# Patient Record
Sex: Female | Born: 1940
Health system: Southern US, Community
[De-identification: ages and names within clinical notes are randomized; demographics above are authoritative.]

## PROBLEM LIST (undated history)

## (undated) DIAGNOSIS — J189 Pneumonia, unspecified organism: Secondary | ICD-10-CM

## (undated) DIAGNOSIS — E119 Type 2 diabetes mellitus without complications: Secondary | ICD-10-CM

## (undated) DIAGNOSIS — N289 Disorder of kidney and ureter, unspecified: Secondary | ICD-10-CM

## (undated) DIAGNOSIS — I509 Heart failure, unspecified: Secondary | ICD-10-CM

## (undated) DIAGNOSIS — I429 Cardiomyopathy, unspecified: Secondary | ICD-10-CM

## (undated) DIAGNOSIS — E7800 Pure hypercholesterolemia, unspecified: Secondary | ICD-10-CM

## (undated) DIAGNOSIS — R55 Syncope and collapse: Secondary | ICD-10-CM

## (undated) DIAGNOSIS — C50919 Malignant neoplasm of unspecified site of unspecified female breast: Secondary | ICD-10-CM

## (undated) DIAGNOSIS — R202 Paresthesia of skin: Secondary | ICD-10-CM

## (undated) DIAGNOSIS — I5042 Chronic combined systolic (congestive) and diastolic (congestive) heart failure: Secondary | ICD-10-CM

## (undated) DIAGNOSIS — E785 Hyperlipidemia, unspecified: Secondary | ICD-10-CM

## (undated) DIAGNOSIS — M1712 Unilateral primary osteoarthritis, left knee: Secondary | ICD-10-CM

## (undated) DIAGNOSIS — I050 Rheumatic mitral stenosis: Secondary | ICD-10-CM

## (undated) DIAGNOSIS — Z950 Presence of cardiac pacemaker: Secondary | ICD-10-CM

## (undated) DIAGNOSIS — Z9289 Personal history of other medical treatment: Secondary | ICD-10-CM

## (undated) DIAGNOSIS — I10 Essential (primary) hypertension: Secondary | ICD-10-CM

## (undated) DIAGNOSIS — C801 Malignant (primary) neoplasm, unspecified: Secondary | ICD-10-CM

## (undated) DIAGNOSIS — M1990 Unspecified osteoarthritis, unspecified site: Secondary | ICD-10-CM

## (undated) DIAGNOSIS — I442 Atrioventricular block, complete: Secondary | ICD-10-CM

## (undated) HISTORY — DX: Disorder of kidney and ureter, unspecified: N28.9

## (undated) HISTORY — DX: Hyperlipidemia, unspecified: E78.5

## (undated) HISTORY — DX: Type 2 diabetes mellitus without complications: E11.9

## (undated) HISTORY — DX: Atrioventricular block, complete: I44.2

## (undated) HISTORY — PX: JOINT REPLACEMENT: SHX530

## (undated) HISTORY — DX: Malignant neoplasm of unspecified site of unspecified female breast: C50.919

## (undated) HISTORY — PX: APPENDECTOMY: SHX54

## (undated) HISTORY — DX: Unilateral primary osteoarthritis, left knee: M17.12

## (undated) HISTORY — DX: Rheumatic mitral stenosis: I05.0

## (undated) HISTORY — DX: Chronic combined systolic (congestive) and diastolic (congestive) heart failure: I50.42

## (undated) HISTORY — PX: CHOLECYSTECTOMY: SHX55

## (undated) HISTORY — PX: OTHER SURGICAL HISTORY: SHX169

## (undated) HISTORY — DX: Syncope and collapse: R55

## (undated) HISTORY — PX: KNEE ARTHROSCOPY: SUR90

## (undated) HISTORY — DX: Presence of cardiac pacemaker: Z95.0

---

## 2006-07-23 ENCOUNTER — Encounter (HOSPITAL_BASED_OUTPATIENT_CLINIC_OR_DEPARTMENT_OTHER): Admission: RE | Admit: 2006-07-23 | Discharge: 2006-10-21 | Payer: Self-pay | Admitting: Surgery

## 2006-09-25 DIAGNOSIS — C801 Malignant (primary) neoplasm, unspecified: Secondary | ICD-10-CM

## 2006-09-25 HISTORY — DX: Malignant (primary) neoplasm, unspecified: C80.1

## 2006-09-25 HISTORY — PX: MASTECTOMY: SHX3

## 2006-10-18 ENCOUNTER — Encounter (HOSPITAL_BASED_OUTPATIENT_CLINIC_OR_DEPARTMENT_OTHER): Admission: RE | Admit: 2006-10-18 | Discharge: 2006-11-01 | Payer: Self-pay | Admitting: Surgery

## 2008-12-08 ENCOUNTER — Ambulatory Visit: Payer: Self-pay | Admitting: Cardiology

## 2009-03-08 ENCOUNTER — Ambulatory Visit: Payer: Self-pay | Admitting: Cardiology

## 2009-05-03 ENCOUNTER — Ambulatory Visit: Payer: Self-pay | Admitting: Cardiology

## 2009-06-25 ENCOUNTER — Ambulatory Visit: Payer: Self-pay | Admitting: Cardiology

## 2009-07-14 ENCOUNTER — Ambulatory Visit: Payer: Self-pay | Admitting: Cardiology

## 2009-10-06 ENCOUNTER — Ambulatory Visit: Payer: Self-pay | Admitting: Cardiology

## 2010-03-09 ENCOUNTER — Ambulatory Visit: Payer: Self-pay | Admitting: Cardiology

## 2010-04-05 ENCOUNTER — Ambulatory Visit (HOSPITAL_COMMUNITY): Admission: RE | Admit: 2010-04-05 | Discharge: 2010-04-05 | Payer: Self-pay | Admitting: Diagnostic Radiology

## 2010-04-09 ENCOUNTER — Ambulatory Visit (HOSPITAL_COMMUNITY): Admission: RE | Admit: 2010-04-09 | Discharge: 2010-04-09 | Payer: Self-pay | Admitting: Internal Medicine

## 2010-06-14 ENCOUNTER — Ambulatory Visit (HOSPITAL_COMMUNITY): Admission: RE | Admit: 2010-06-14 | Discharge: 2010-06-14 | Payer: Self-pay | Admitting: Neurosurgery

## 2010-12-08 LAB — PROTIME-INR
INR: 1.05 (ref 0.00–1.49)
Prothrombin Time: 13.9 seconds (ref 11.6–15.2)

## 2010-12-08 LAB — FUNGUS CULTURE W SMEAR: Fungal Smear: NONE SEEN

## 2010-12-08 LAB — GLUCOSE, CAPILLARY: Glucose-Capillary: 125 mg/dL — ABNORMAL HIGH (ref 70–99)

## 2010-12-08 LAB — AFB CULTURE WITH SMEAR (NOT AT ARMC): Acid Fast Smear: NONE SEEN

## 2010-12-08 LAB — CBC
HCT: 31.6 % — ABNORMAL LOW (ref 36.0–46.0)
RBC: 3.52 MIL/uL — ABNORMAL LOW (ref 3.87–5.11)

## 2010-12-08 LAB — WOUND CULTURE

## 2010-12-08 LAB — APTT: aPTT: 32 seconds (ref 24–37)

## 2010-12-08 LAB — ANAEROBIC CULTURE: Gram Stain: NONE SEEN

## 2010-12-11 LAB — CBC
Hemoglobin: 10.3 g/dL — ABNORMAL LOW (ref 12.0–15.0)
MCH: 30.8 pg (ref 26.0–34.0)
MCHC: 33.1 g/dL (ref 30.0–36.0)
MCV: 93 fL (ref 78.0–100.0)

## 2010-12-11 LAB — BODY FLUID CULTURE

## 2010-12-11 LAB — GLUCOSE, CAPILLARY: Glucose-Capillary: 195 mg/dL — ABNORMAL HIGH (ref 70–99)

## 2010-12-11 LAB — PROTIME-INR
INR: 1.11 (ref 0.00–1.49)
Prothrombin Time: 14.2 seconds (ref 11.6–15.2)

## 2011-02-10 NOTE — Assessment & Plan Note (Signed)
[facility]   NAME:  Cassandra Parker, Cassandra Parker            ACCOUNT NO.:  [id number]   MEDICAL RECORD NO.:  [id number]      DATE OF BIRTH:  1940-10-01   PHYSICIAN:  Epifania Gore. Nils Pyle, M.D.      VISIT DATE:                                     OFFICE VISIT   VITAL SIGNS:  Blood pressure is 160/75, respirations 16, pulse rate 76, and  she is afebrile.  Capillary blood glucose is 102 mg per cent.   PURPOSE OF TODAY'S VISIT:  Cassandra Parker is a 70 year old lady who returns  complaining of persistence of her stasis ulcer.  In the interim, she has  been wearing a compressive wrap.  She reports some moderate drainage but no  malodor.  She has complained of some tightness of the wrap and is requesting  a looser wrap during this session.   WOUND EXAM:  Inspection of the right lateral leg shows that the ulcer is  completely granulated with skin, exudate, no necrosis, and no need for  debridement.  There are no excessive abrasions or injuries related to her  previous wrap.   WOUND SINCE LAST VISIT:  Improved stasis ulcer.   MANAGEMENT PLAN & GOAL:  We will continue Unna wrap, we will add Promagran  to her treatment, wound care protocol.  We have counseled the patient with  regard to the use of compression.  She has in her possession bilateral below  the knee 30-40 mm compression hose but she is not wearing them.  We have  instructed her that she is to start wearing the hose on the left lower  extremity.  She is to avoid getting her compression wrap wet.  We expect the  wrap to become somewhat tight with excessive standing and walking.  If this  occurs, the patient is to elevate her legs for 15 minutes and if there is no  significant relief, she is to completely remove the wrap and call the clinic  for priority appointment.  The patient seems to understand and indicates  that she will be compliant as per above.  We will re evaluate her in 1 week.     ______________________________  Epifania Gore. Nils Pyle, M.D.     Rondel Oh  D:  08/14/2006  T:  08/14/2006  Job:  [id number]

## 2011-02-10 NOTE — Assessment & Plan Note (Signed)
[facility]   NAME:  [name], [name]            ACCOUNT NO.:  [id number]   MEDICAL RECORD NO.:  [id number]      DATE OF BIRTH:  May 03, 1941   PHYSICIAN:  Epifania Gore. Nils Pyle, M.D.      VISIT DATE:                                     OFFICE VISIT   SUBJECTIVE:  Ms. Cassandra Parker is a 70 year old female who we followed for stasis  ulcer on the lateral aspect of her right leg.  In the interim, we have  treated her with compression wraps.  She denies pain, fever, excessive  drainage, or malodor.   OBJECTIVE:  Blood pressure is 162/90, respirations 16, pulse rate 76,  temperature 98.2, capillary blood glucose is 123 mg%.  Inspection of the  lower extremity shows that there has been adequate compression manifested in  the medial wrinkles in the right lower extremity.  The ulcer is clean; there  is healthy granulating tissue.  There is no evidence of active infection,  cellulitis, or induration.   IMPRESSION:  Improved wound.   PLAN:  We will continue external compression.  We have noted that her recent  culture was positive for Pseudomonas aeruginosa.  We will continue local  wound care as treatment of choice.           ______________________________  Epifania Gore Nils Pyle, M.D.     Rondel Oh  D:  08/07/2006  T:  08/08/2006  Job:  [id number]

## 2011-02-10 NOTE — Assessment & Plan Note (Signed)
[facility]   NAME:  Cassandra Parker, BORJAS            ACCOUNT NO.:  [id number]   MEDICAL RECORD NO.:  [id number]      DATE OF BIRTH:  January 20, 1941   PHYSICIAN:  Epifania Gore. Nils Pyle, M.D.      VISIT DATE:                                     OFFICE VISIT   SUBJECTIVE:  Ms. [name] is a 70 year old female who was referred from Dr.  Consuello Masse in [place] for evaluation of a nonhealing ulceration on the  posterolateral aspect of the right lower extremity.   IMPRESSION:  Stasis ulcer.   PLAN:  Stasis ulcer, Unna boot protocol including a Silver Matrix dressing.  The patient may ultimately be a candidate for an Apligraf if significant  reduction in volume does not occur.   SUBJECTIVE:  Ms. Cassandra Parker is a 70 year old diabetic who has had an ulcer in  the aforementioned area for several weeks.  She has been managed as  outpatient with compression wraps.  She has had a screening arterial Doppler  exam, demonstrating a normal ABI.  She has also been treated with a course  of doxycycline.  Throughout her management, her sugars have been reasonably  well-controlled on her current agents.   PAST MEDICAL HISTORY:  Remarkable for the absence of allergies.   CURRENT MEDICATION LIST:  1. Metformin 850 mg b.i.d.  2. Lisinopril 20 mg every day.  3. Gabapentin 600 mg 4 every day.  4. Glyburide 5 mg 2 p.o. b.i.d.  5. Actos 30 mg every day.  6. Hydrochlorothiazide 25 mg every day.  7. Iron 65 mg b.i.d.  8. Tylenol Extra Strength p.r.n.   PAST SURGICAL HISTORY:  1. Cholecystectomy.  2. Appendectomy.   FAMILY HISTORY:  1. Diabetes.  2. Hypertension.  3. Heart attacks.   SOCIAL HISTORY:  She is married.  She has adult children who live locally.  She and her husband reside in [place].  She is medically retired.   REVIEW OF SYSTEMS:  Is specifically negative for angina pectoris.  She  reports that she has gained 30-40 pounds over the last year.  She feels that  this is related to  lack of activity which has been hampered by pain in her  knees.  She denies syncope.  She denies GI/GU complaints.  There has been no  orthopnea, and she has never smoked.  The remainder of the review of systems  is negative.   PHYSICAL EXAM:  GENERAL:  She is alert, oriented, in no acute distress.  VITAL SIGNS:  Blood pressure is 140/80.  Respirations are 18.  Pulse rate is  64.  She is afebrile.  Capillary blood glucose is 61 mg%.  HEENT:  Clear.  NECK:  Supple.  Trachea is midline.  Thyroid is nonpalpable.  LUNGS:  Clear.  HEART:  Sounds are normal.  ABDOMEN: Protuberant.  No masses are discernable.  There is bilateral 2+  edema in the lower extremities.  The dorsalis pedis pulses are 3+ and  bounding bilaterally.  There are prominent varicosities in both lower  extremities extending into the medial as well as the lateral thighs.  On the  right lower extremity laterally, there is a full thickness classic stasis  ulcer with evidence of some granulated capillary buds.  There  is a moderate  amount of exudate, but no extreme malodor.  NEUROLOGICALLY:  The patient retains protected sensation.   DISCUSSION:  This 70 year old lady has a stasis ulceration, most likely  protracted because of her tendency to retain fluid.  We have explained a  course of therapy that would involve continued compression wraps with a  Silver Matrix dressing.  Hopefully we will be able to establish granulating  base and prepare the wound for the application of an Apligraf.  The patient  has been given an opportunity to ask questions.  She seems to understand the  therapeutic approach and indicates that she would be compliant as indicated  above.           ______________________________  Epifania Gore. Nils Pyle, M.D.     Rondel Oh  D:  07/23/2006  T:  07/23/2006  Job:  [id number]   cc:   Consuello Masse

## 2011-02-10 NOTE — Assessment & Plan Note (Signed)
[facility]   NAME:  Cassandra Parker, Cassandra Parker            ACCOUNT NO.:  [id number]   MEDICAL RECORD NO.:  [id number]          DATE OF BIRTH:   PHYSICIAN:  Epifania Gore. Nils Pyle, M.D. VISIT DATE:  10/16/2006                                   OFFICE VISIT   VITAL SIGNS:  Blood pressure is 158/78, respirations 18, pulse rate 91,  temperature 97.9.  Capillary blood glucose is 91 mg%.   PURPOSE OF TODAY'S VISIT:  Cassandra Parker is a 70 year old lady who were  are treating for a stasis ulceration.  In the interim, she has worn an  Unna wrap, but has complained of intense itching and blister formation.  She denies fever.   WOUND EXAM:  The inspection of the lateral left leg shows that there is  a healthy-appearing granulating stasis ulcer, which is significantly  decreased in volume, as per the nurses measurements.  In addition, there  are areas of blister formation in the proximal area of the previous wrap  and there is an area of near blister formation on the anterior shin.  The edema is well controlled and is 1-2+ currently.  There is no  evidence of ascending infection and there is no pain.   DIAGNOSIS:  Improvement of stasis ulcer, question of topical allergy to  Unna boot components.   MANAGEMENT PLAN & GOAL:  We will discontinue the use of the Unna boot.  We will apply Ultra Mide cream and a Profore wrap.  We will reevaluate  the patient in 1 week.           ______________________________  Epifania Gore. Nils Pyle, M.D.     Rondel Oh  D:  10/16/2006  T:  10/16/2006  Job:  [id number]

## 2011-02-10 NOTE — Assessment & Plan Note (Signed)
[facility]   NAME:  CHARON, BURGEN            ACCOUNT NO.:  [id number]   MEDICAL RECORD NO.:  [id number]      DATE OF BIRTH:  [birth year]   PHYSICIAN:  Epifania Gore. Nils Pyle, M.D. VISIT DATE:  10/04/2006                                   OFFICE VISIT   VITAL SIGNS:  Blood pressure is 190/94, respirations 18, pulse rate 86,  temperature 97.8, capillary blood glucose 142 mg percent.   PURPOSE OF TODAY'S VISIT:  Ms. Grassel returns for followup of the  stasis ulcer.   WOUND EXAM:  Inspection of the right lateral leg shows that there is  decreased volume in the wound.  There is 100% granulation and scant  drainage.  There is associated 1 to 2+ edema.   WOUND SINCE LAST VISIT:  In the interim, she has been wearing a  compression wrap.  She reports decreased drainage, no malodor, no pain,  and no fever.   DIAGNOSIS:  Improved stasis ulcer.   MANAGEMENT PLAN & GOAL:  We will return the patient to an Unna wrap and  reevaluate her in one week.           ______________________________  Epifania Gore. Nils Pyle, M.D.     Rondel Oh  D:  10/04/2006  T:  10/04/2006  Job:  [id number]

## 2011-02-10 NOTE — Assessment & Plan Note (Signed)
[facility]   NAME:  JULANE, MORRICAL            ACCOUNT NO.:  [id number]   MEDICAL RECORD NO.:  [id number]      DATE OF BIRTH:  [birth year]   PHYSICIAN:  Epifania Gore. Nils Pyle, M.D.      VISIT DATE:                                   OFFICE VISIT   VITAL SIGNS:  Blood pressure 138/82, respirations 18, pulse rate 67,  temperature 97.2.  Capillary blood glucose is 79 mg%.   PURPOSE OF TODAY'S VISIT:  Ms. Tsutsui returns for followup of a right  lower extremity stasis ulcer.  In the interim, she has been treated with  Unna wrap.  She denies excessive drainage, malodor, pain, or fever.   WOUND EXAM:  Inspection of the right lower extremity shows that the  previous wound #2, which was related to wrap injury, is completely  resolved.  Wound #1 has decreased significantly in volume.  There is  100% granulation.  There is no need for debridement.   WOUND SINCE LAST VISIT:  Improvement of stasis ulcer.   MANAGEMENT PLAN & GOAL:  We will return the patient to an Unna wrap and  reevaluate her in 1 week.           ______________________________  Epifania Gore. Nils Pyle, M.D.     Rondel Oh  D:  09/27/2006  T:  09/27/2006  Job:  [id number]

## 2011-02-10 NOTE — Assessment & Plan Note (Signed)
[facility]   NAME:  Cassandra Parker, Cassandra Parker            ACCOUNT NO.:  [id number]   MEDICAL RECORD NO.:  [id number]      DATE OF BIRTH:  1941-05-23   PHYSICIAN:  Epifania Gore. Nils Pyle, M.D.      VISIT DATE:                                   OFFICE VISIT   VITAL SIGNS:  Her blood pressure is 132/82, respirations 20, pulse rate  78 and she is afebrile.  Capillary blood glucose is 153 mg percent.  g   PURPOSE OF TODAY'S VISIT:  [name] returns for followup of stasis  ulceration on her right lateral lower extremity.  In the interim, she  has complained of some excessive drainage and no excessive odor and no  pain.   WOUND EXAM:  Inspection of the right lateral leg shows that the wound is  100% granulated with clear advancing rim of epithelium at the  circumference.  Wound #2 is a ruptured blister secondary to wrap injury.  This has essentially an [place] of epithelium surrounded by healthy  appearing granulation.  Both wounds were photographed and entered into  the Wound Expert.   WOUND SINCE LAST VISIT:   CHANGE IN INTERVAL MEDICAL HISTORY:   DIAGNOSIS:  Clinical improvement of stasis ulcers as well as the wrap-  related injury.   TREATMENT:   ANESTHETIC USED:   TISSUE DEBRIDED:   LEVEL:   CHANGE IN MEDS:   COMPRESSION BANDAGE:   OTHER:   MANAGEMENT PLAN & GOAL:  We will continue the Unna wrap with a Softsorb  and reevaluate the patient in one week.          ______________________________  Epifania Gore. Nils Pyle, M.D.    Rondel Oh  D:  09/20/2006  T:  09/21/2006  Job:  [id number]

## 2011-02-10 NOTE — Assessment & Plan Note (Signed)
[facility]   NAME:  Cassandra Parker, Cassandra Parker            ACCOUNT NO.:  [id number]   MEDICAL RECORD NO.:  [id number]      DATE OF BIRTH:  Dec 29, 1940   PHYSICIAN:  Epifania Gore. Nils Pyle, M.D. VISIT DATE:  10/23/2006                                   OFFICE VISIT   SUBJECTIVE:  Cassandra Parker returns for follow-up of a stasis ulcer  involving her right lower extremity.  In the interim, she has been  treated with Ultra Mide and multilayer compression wrap.  She denies  extreme pain, fever or malodor.   OBJECTIVE:  Her vital signs are stable.  Blood pressure is 186/77.  Respirations 20.  Pulse rate 87.  Temperature of 98.1.  Blood glucose of  96.  Inspection of the lower extremity shows that wound #1 on the  lateral aspect has resolved.  Wound #2 proximally has decreased in  volume.  There is no evidence of infection or excessive drainage or  malodor.   ASSESSMENT:  Clinical improvement of stasis ulcers, responding to  compression.   PLAN:  We will return the patient to compression wrap with Ultra Mide  cream and reevaluate her in one week.           ______________________________  Epifania Gore. Nils Pyle, M.D.     Cassandra Parker  D:  10/23/2006  T:  10/24/2006  Job:  [id number]

## 2011-02-10 NOTE — Assessment & Plan Note (Signed)
[facility]   NAME:  DER, FEI            ACCOUNT NO.:  [id number]   MEDICAL RECORD NO.:  [id number]      DATE OF BIRTH:  27-Apr-1941   PHYSICIAN:  Epifania Gore. Nils Pyle, M.D.      VISIT DATE:                                   OFFICE VISIT   VITAL SIGNS:  Blood pressure is 140/80, respirations 18, pulse rate 74,  and she is 98.0.  Capillary blood glucose is 151 mg.   PURPOSE OF TODAY'S VISIT:  Ms. Chauca return for follow up of a right  lower extremity stasis ulcer.  In the interim she has been treated with  an Una-wrap and ponogram.  She did not have excessive drainage, malodor,  pain, or fever.   WOUND EXAM:  Inspection of the right lower extremity shows that the  wound is 100% granulated with scant drainage.  The granulation appears  to be healthy.  There is evidence of advancement of epithelium from the  edges.  The dimensions of the wounds are essentially static.   DIAGNOSIS:  Improved stasis ulcer.   MANAGEMENT PLAN & GOAL:  We will proceed with an Apligraf for her next  visit.           ______________________________  Epifania Gore. Nils Pyle, M.D.     Rondel Oh  D:  08/22/2006  T:  08/22/2006  Job:  [id number]

## 2011-02-10 NOTE — Assessment & Plan Note (Signed)
[facility]   NAME:  Cassandra Parker, RADUENZ            ACCOUNT NO.:  [id number]   MEDICAL RECORD NO.:  [id number]      DATE OF BIRTH:  11/08/1940   PHYSICIAN:  Epifania Gore. Nils Pyle, M.D. VISIT DATE:  09/04/2006                                   OFFICE VISIT   PURPOSE OF TODAY'S VISIT:  [name] returns for followup of an  Apligraf 1 week following this application.  She denies excessive  drainage, malodor, pain or fever.   WOUND EXAM:  The previous dressing was taken down to the nonadherent  dressing.  There is no excessive drainage.  The Apligraf was not  inspected.  We have ordered for a new multilayer compressive wrap to be  reapplied.   DIAGNOSIS:  Satisfactory postop Apligraf application.   MANAGEMENT PLAN & GOAL:  We will reevaluate the patient in 1 week.           ______________________________  Epifania Gore. Nils Pyle, M.D.     Rondel Oh  D:  09/04/2006  T:  09/05/2006  Job:  [id number]

## 2011-02-10 NOTE — Assessment & Plan Note (Signed)
[facility]   NAME:  ZEILA, PERTEET            ACCOUNT NO.:  [id number]   MEDICAL RECORD NO.:  [id number]      DATE OF BIRTH:  03/25/1941   PHYSICIAN:  Epifania Gore. Nils Pyle, M.D. VISIT DATE:  08/30/2006                                   OFFICE VISIT   VITAL SIGNS:  The blood pressure is 170/80, respirations 18, pulse rate  78, and she is afebrile.  Capillary blood glucose is 132 mg%.   PURPOSE OF TODAY'S VISIT:  Ms. Feimster is a 70 year old lady with a  stasis ulceration on her right lateral lower extremity.  She has been  treated wound preparation involving serial debridements, silver  dressings, and matrix dressings.  The wound has shown some improvement  but has lagged beyond acceptable healing closure rates.  We recommended  that we add a Apligraf.  She is presenting today for that purpose.  In  the interim, she reports no excessive drainage, no fever.   WOUND EXAM:  The compression wrap was removed.  A debridement was  performed of the wound bed down to healthy bleeding granulation tissue.  There were no retained elements of necrotic or non-viable tissue.  The  wound was copiously irrigated with saline.  Thereafter, an Apligraf was  prepared with a free-hand mesh technique and placed in the position  without difficulty.  A non-adherent dressing was placed next to the  Apligraf followed by a moist bolus of 4 x 4's.  Thereafter, a standard  Profore wrap was placed without difficulty.   DIAGNOSIS:  Satisfactory placement of Apligraf on a stasis ulcer.   MANAGEMENT PLAN & GOAL:  We will follow the patient in 1 week.  We have  counseled the patient regarding the possibility of the wrap becoming too  tight as a result of activity and/or too loose as a result of decreasing  edema.  We have advised her to call the office if she has to manipulate  the wrap at all, but, by all  means, to take care not to dislodge or  discard the Apligraf which is in satisfactory  position.  The patient  seems to understand.  She will give [place] a call in the interim if need be,  otherwise we will see her in 1 week.           ______________________________  Epifania Gore. Nils Pyle, M.D.     Rondel Oh  D:  08/30/2006  T:  08/31/2006  Job:  [id number]

## 2011-02-10 NOTE — Assessment & Plan Note (Signed)
[facility]   NAME:  Cassandra Parker, Cassandra Parker            ACCOUNT NO.:  [id number]   MEDICAL RECORD NO.:  [id number]      DATE OF BIRTH:  1941/06/04   PHYSICIAN:  Epifania Gore. Nils Pyle, M.D.      VISIT DATE:                                   OFFICE VISIT   VITAL SIGNS:  Blood pressure is 140/80, respirations 20, pulse rate 78.  She is afebrile.  Capillary blood glucose is 102 mg percent.   PURPOSE OF TODAY'S VISIT:  Cassandra Parker is a 70 year old female who is  seen in followup for a stasis ulcer involving her right lower extremity.  She has undergone placement of an Apligraf.  We are evaluating the wound  for its response.  In the interim she reports some drainage.  No malodor  and no fever.   WOUND EXAM:  Inspection of the wound shows that there is 100%  granulating base.  There is evidence of epithelium advancing from the  periphery of the wound.  Volume has not changed significantly, although  the depth of the wound seems to have decreased.  Measurements are  essentially unchanged.   WOUND SINCE LAST VISIT:   CHANGE IN INTERVAL MEDICAL HISTORY:   DIAGNOSIS:  Marginal improvement with Apligraf.   TREATMENT:   ANESTHETIC USED:   TISSUE DEBRIDED:   LEVEL:   CHANGE IN MEDS:   COMPRESSION BANDAGE:   OTHER:   MANAGEMENT PLAN & GOAL:  We will resume compression wrap and reevaluate  the patient in one week.           ______________________________  Epifania Gore. Nils Pyle, M.D.    Cassandra Parker  D:  09/11/2006  T:  09/12/2006  Job:  [id number]

## 2011-02-10 NOTE — Assessment & Plan Note (Signed)
[facility]   NAME:  [name], [name]            ACCOUNT NO.:  [id number]   MEDICAL RECORD NO.:  [id number]      DATE OF BIRTH:  [birth year]   PHYSICIAN:  Epifania Gore. Nils Pyle, M.D. VISIT DATE:  10/30/2006                                   OFFICE VISIT   SUBJECTIVE:  Cassandra Parker is a 70 year old lady who we follow for a  right stasis ulcer.  During the interim she was treated with serial  debridements, compression wraps, and an Apligraf.  During the interim  she denies excessive drainage, malodor, or pain.   OBJECTIVE:  Blood pressure is 174/86, respirations 20, pulse rate 94,  temperature 97.6.  Capillary blood glucose is 156 mg%.  Inspection of  the right lower extremity shows that the ulcers have completely  resolved.  There is healthy mature eschar with no drainage, no  inflammatory changes.  The edema is well-controlled as a result of the  compression wrap.   PLAN:  We are discharging the patient.  We have instructed her to wear  the compression hose, which she has already procured.  She understands  that if she experiences trauma, recurrent ulceration she is to contact  the wound center or her primary care physician as she will require  reinstitution of compression.  The patient seems to understand.  She has  been given an opportunity to ask questions.  She expresses gratitude for  having been seen in the clinic and indicates that she will be compliant  as per above.           ______________________________  Epifania Gore. Nils Pyle, M.D.     Rondel Oh  D:  10/30/2006  T:  10/31/2006  Job:  [id number]   cc:   Consuello Masse

## 2011-02-10 NOTE — Assessment & Plan Note (Signed)
[facility]   NAME:  LORMA, PASSAFIUME            ACCOUNT NO.:  [id number]   MEDICAL RECORD NO.:  [id number]      DATE OF BIRTH:  11-09-1940   PHYSICIAN:  Epifania Gore. Nils Pyle, M.D. VISIT DATE:  07/31/2006                                     OFFICE VISIT   SUBJECTIVE:  Miss Bryand returns for followup of a stasis ulcer on the  right lower extremity.  In the interim she reports that there has been  significant drainage through her wrap.  She denies fever, pain, or excessive  swelling.   OBJECTIVE:  Her blood pressure:  140/90.  Respirations 18.  Pulse rate 82  inches.  She is afebrile.  Capillary blood glucose is 61 mg%.  Inspection of  the wound shows that there is a clean, granulated wound on the lateral  aspect of the right lower extremity.  There are associated signs of chronic  stasis including hyperpigmentation.  The edema is reasonably controlled.  It  is now 1+.  There are linear wrinkles consistent with adequate compression.   IMPRESSION:  Clinical response to compression of the stasis ulcer.   PLAN:  The was cultured and an Unna boot reapplied.  We will re-evaluate the  patient in 1 week.           ______________________________  Epifania Gore. Nils Pyle, M.D.     Rondel Oh  D:  07/31/2006  T:  08/01/2006  Job:  [id number]   cc:   Consuello Masse

## 2011-06-26 ENCOUNTER — Encounter (HOSPITAL_COMMUNITY)
Admission: RE | Admit: 2011-06-26 | Discharge: 2011-06-26 | Disposition: A | Discharge status: Home / Self Care | Payer: Medicare Other | Source: Ambulatory Visit | Attending: Ophthalmology | Admitting: Ophthalmology

## 2011-06-26 ENCOUNTER — Other Ambulatory Visit: Payer: Self-pay

## 2011-06-26 ENCOUNTER — Encounter (HOSPITAL_COMMUNITY): Payer: Self-pay

## 2011-06-26 HISTORY — DX: Malignant (primary) neoplasm, unspecified: C80.1

## 2011-06-26 HISTORY — DX: Pure hypercholesterolemia, unspecified: E78.00

## 2011-06-26 HISTORY — DX: Essential (primary) hypertension: I10

## 2011-06-26 LAB — CBC
Hemoglobin: 10.9 g/dL — ABNORMAL LOW (ref 12.0–15.0)
RBC: 3.57 MIL/uL — ABNORMAL LOW (ref 3.87–5.11)
WBC: 7.2 10*3/uL (ref 4.0–10.5)

## 2011-06-26 LAB — BASIC METABOLIC PANEL
BUN: 25 mg/dL — ABNORMAL HIGH (ref 6–23)
CO2: 27 mEq/L (ref 19–32)
Calcium: 9.3 mg/dL (ref 8.4–10.5)
Chloride: 107 mEq/L (ref 96–112)
Glucose, Bld: 205 mg/dL — ABNORMAL HIGH (ref 70–99)
Potassium: 4.1 mEq/L (ref 3.5–5.1)

## 2011-06-26 NOTE — Patient Instructions (Addendum)
[name]  06/26/2011   Your procedure is scheduled on:  06/29/2011  Report to [address] [facility] at  57  AM.  Call this number if you have problems the morning of surgery: [phone]   Remember:   Do not eat food:After Midnight.  Do not drink clear liquids: After Midnight.  Take these medicines the morning of surgery with A SIP OF WATER: Coreg and lisinopril   Do not wear jewelry, make-up or nail polish.  Do not wear lotions, powders, or perfumes. You may wear deodorant.  Do not shave 48 hours prior to surgery.  Do not bring valuables to the hospital.  Contacts, dentures or bridgework may not be worn into surgery.  Leave suitcase in the car. After surgery it may be brought to your room.  For patients admitted to the hospital, checkout time is 11:00 AM the day of discharge.   Patients discharged the day of surgery will not be allowed to drive home.  Name and phone number of your driver: family  Special Instructions: N/A   Please read over the following fact sheets that you were given: Pain Booklet, Surgical Site Infection Prevention, Anesthesia Post-op Instructions and Care and Recovery After Surgery PATIENT INSTRUCTIONS POST-ANESTHESIA  IMMEDIATELY FOLLOWING SURGERY:  Do not drive or operate machinery for the first twenty four hours after surgery.  Do not make any important decisions for twenty four hours after surgery or while taking narcotic pain medications or sedatives.  If you develop intractable nausea and vomiting or a severe headache please notify your doctor immediately.  FOLLOW-UP:  Please make an appointment with your surgeon as instructed. You do not need to follow up with anesthesia unless specifically instructed to do so.  WOUND CARE INSTRUCTIONS (if applicable):  Keep a dry clean dressing on the anesthesia/puncture wound site if there is drainage.  Once the wound has quit draining you may leave it open to air.  Generally you should leave the bandage intact for twenty  four hours unless there is drainage.  If the epidural site drains for more than 36-48 hours please call the anesthesia department.  QUESTIONS?:  Please feel free to call your physician or the hospital operator if you have any questions, and they will be happy to assist you.     [place] [phone]

## 2011-06-29 ENCOUNTER — Encounter (HOSPITAL_COMMUNITY): Payer: Self-pay | Admitting: Ophthalmology

## 2011-06-29 ENCOUNTER — Encounter (HOSPITAL_COMMUNITY): Payer: Self-pay | Admitting: Anesthesiology

## 2011-06-29 ENCOUNTER — Ambulatory Visit (HOSPITAL_COMMUNITY)
Admission: RE | Admit: 2011-06-29 | Discharge: 2011-06-29 | Disposition: A | Discharge status: Home / Self Care | Payer: Medicare Other | Source: Ambulatory Visit | Attending: Ophthalmology | Admitting: Ophthalmology

## 2011-06-29 ENCOUNTER — Encounter (HOSPITAL_COMMUNITY)
Admission: RE | Disposition: A | Discharge status: Home / Self Care | Payer: Self-pay | Source: Ambulatory Visit | Attending: Ophthalmology

## 2011-06-29 ENCOUNTER — Ambulatory Visit (HOSPITAL_COMMUNITY): Payer: Medicare Other | Admitting: Anesthesiology

## 2011-06-29 DIAGNOSIS — Z01810 Encounter for preprocedural cardiovascular examination: Secondary | ICD-10-CM | POA: Insufficient documentation

## 2011-06-29 DIAGNOSIS — Z01812 Encounter for preprocedural laboratory examination: Secondary | ICD-10-CM | POA: Insufficient documentation

## 2011-06-29 DIAGNOSIS — H2589 Other age-related cataract: Secondary | ICD-10-CM | POA: Insufficient documentation

## 2011-06-29 DIAGNOSIS — I10 Essential (primary) hypertension: Secondary | ICD-10-CM | POA: Insufficient documentation

## 2011-06-29 DIAGNOSIS — Z79899 Other long term (current) drug therapy: Secondary | ICD-10-CM | POA: Insufficient documentation

## 2011-06-29 DIAGNOSIS — E785 Hyperlipidemia, unspecified: Secondary | ICD-10-CM | POA: Insufficient documentation

## 2011-06-29 DIAGNOSIS — E119 Type 2 diabetes mellitus without complications: Secondary | ICD-10-CM | POA: Insufficient documentation

## 2011-06-29 HISTORY — PX: CATARACT EXTRACTION W/PHACO: SHX586

## 2011-06-29 SURGERY — PHACOEMULSIFICATION, CATARACT, WITH IOL INSERTION
Anesthesia: Monitor Anesthesia Care | Site: Eye | Laterality: Right | Wound class: Clean

## 2011-06-29 MED ORDER — NEOMYCIN-POLYMYXIN-DEXAMETH 3.5-10000-0.1 OP OINT
TOPICAL_OINTMENT | OPHTHALMIC | Status: AC
  Filled 2011-06-29: qty 3.5

## 2011-06-29 MED ORDER — LACTATED RINGERS IV SOLN
INTRAVENOUS | Status: DC
  Administered 2011-06-29: 12:00:00 via INTRAVENOUS

## 2011-06-29 MED ORDER — BSS IO SOLN
INTRAOCULAR | Status: DC | PRN
  Administered 2011-06-29: 15 mL via OPHTHALMIC

## 2011-06-29 MED ORDER — PHENYLEPHRINE HCL 2.5 % OP SOLN
1.0000 [drp] | OPHTHALMIC | Status: AC
  Administered 2011-06-29 (×3): 1 [drp] via OPHTHALMIC

## 2011-06-29 MED ORDER — CYCLOPENTOLATE-PHENYLEPHRINE 0.2-1 % OP SOLN
OPHTHALMIC | Status: AC
  Administered 2011-06-29: 1 [drp] via OPHTHALMIC
  Filled 2011-06-29: qty 2

## 2011-06-29 MED ORDER — LIDOCAINE HCL 3.5 % OP GEL
1.0000 "application " | Freq: Once | OPHTHALMIC | Status: AC
  Administered 2011-06-29: 1 via OPHTHALMIC

## 2011-06-29 MED ORDER — TETRACAINE HCL 0.5 % OP SOLN
OPHTHALMIC | Status: AC
  Administered 2011-06-29: 1 [drp] via OPHTHALMIC
  Filled 2011-06-29: qty 2

## 2011-06-29 MED ORDER — MIDAZOLAM HCL 2 MG/2ML IJ SOLN
INTRAMUSCULAR | Status: AC
  Filled 2011-06-29: qty 2

## 2011-06-29 MED ORDER — LIDOCAINE HCL (PF) 1 % IJ SOLN
INTRAMUSCULAR | Status: AC
  Filled 2011-06-29: qty 2

## 2011-06-29 MED ORDER — PROVISC 10 MG/ML IO SOLN
INTRAOCULAR | Status: DC | PRN
  Administered 2011-06-29: .85 mL via OPHTHALMIC

## 2011-06-29 MED ORDER — LIDOCAINE 3.5 % OP GEL OPTIME - NO CHARGE
OPHTHALMIC | Status: DC | PRN
  Administered 2011-06-29: 1 [drp] via OPHTHALMIC

## 2011-06-29 MED ORDER — TETRACAINE HCL 0.5 % OP SOLN
1.0000 [drp] | OPHTHALMIC | Status: AC
  Administered 2011-06-29 (×3): 1 [drp] via OPHTHALMIC

## 2011-06-29 MED ORDER — EPINEPHRINE HCL 1 MG/ML IJ SOLN
INTRAOCULAR | Status: DC | PRN
  Administered 2011-06-29: 12:00:00

## 2011-06-29 MED ORDER — EPINEPHRINE HCL 1 MG/ML IJ SOLN
INTRAMUSCULAR | Status: AC
  Filled 2011-06-29: qty 1

## 2011-06-29 MED ORDER — POVIDONE-IODINE 5 % OP SOLN
OPHTHALMIC | Status: DC | PRN
  Administered 2011-06-29: 1 via OPHTHALMIC

## 2011-06-29 MED ORDER — LIDOCAINE HCL (PF) 1 % IJ SOLN
INTRAMUSCULAR | Status: DC | PRN
  Administered 2011-06-29: .3 mL

## 2011-06-29 MED ORDER — MIDAZOLAM HCL 2 MG/2ML IJ SOLN
1.0000 mg | INTRAMUSCULAR | Status: DC | PRN
  Administered 2011-06-29: 2 mg via INTRAVENOUS

## 2011-06-29 MED ORDER — NEOMYCIN-POLYMYXIN-DEXAMETH 0.1 % OP OINT
TOPICAL_OINTMENT | OPHTHALMIC | Status: DC | PRN
  Administered 2011-06-29: 1 via OPHTHALMIC

## 2011-06-29 MED ORDER — CYCLOPENTOLATE-PHENYLEPHRINE 0.2-1 % OP SOLN
1.0000 [drp] | OPHTHALMIC | Status: AC
  Administered 2011-06-29 (×3): 1 [drp] via OPHTHALMIC

## 2011-06-29 MED ORDER — PHENYLEPHRINE HCL 2.5 % OP SOLN
OPHTHALMIC | Status: AC
  Administered 2011-06-29: 1 [drp] via OPHTHALMIC
  Filled 2011-06-29: qty 2

## 2011-06-29 MED ORDER — LIDOCAINE HCL 3.5 % OP GEL
OPHTHALMIC | Status: AC
  Administered 2011-06-29: 1 via OPHTHALMIC
  Filled 2011-06-29: qty 5

## 2011-06-29 SURGICAL SUPPLY — 33 items
CAPSULAR TENSION RING-AMO (OPHTHALMIC RELATED) IMPLANT
CLOTH BEACON ORANGE TIMEOUT ST (SAFETY) ×2 IMPLANT
DUOVISC SYSTEM (INTRAOCULAR LENS)
EYE SHIELD UNIVERSAL CLEAR (GAUZE/BANDAGES/DRESSINGS) ×2 IMPLANT
GLOVE BIO SURGEON STRL SZ 6.5 (GLOVE) IMPLANT
GLOVE BIOGEL PI IND STRL 6.5 (GLOVE) ×1 IMPLANT
GLOVE BIOGEL PI IND STRL 7.0 (GLOVE) IMPLANT
GLOVE BIOGEL PI IND STRL 7.5 (GLOVE) IMPLANT
GLOVE BIOGEL PI INDICATOR 6.5 (GLOVE) ×1
GLOVE BIOGEL PI INDICATOR 7.0 (GLOVE)
GLOVE BIOGEL PI INDICATOR 7.5 (GLOVE)
GLOVE ECLIPSE 6.5 STRL STRAW (GLOVE) ×2 IMPLANT
GLOVE ECLIPSE 7.0 STRL STRAW (GLOVE) IMPLANT
GLOVE ECLIPSE 7.5 STRL STRAW (GLOVE) IMPLANT
GLOVE EXAM NITRILE LRG STRL (GLOVE) IMPLANT
GLOVE EXAM NITRILE MD LF STRL (GLOVE) ×2 IMPLANT
GLOVE SKINSENSE NS SZ6.5 (GLOVE)
GLOVE SKINSENSE NS SZ7.0 (GLOVE)
GLOVE SKINSENSE STRL SZ6.5 (GLOVE) IMPLANT
GLOVE SKINSENSE STRL SZ7.0 (GLOVE) IMPLANT
KIT VITRECTOMY (OPHTHALMIC RELATED) IMPLANT
PAD ARMBOARD 7.5X6 YLW CONV (MISCELLANEOUS) ×2 IMPLANT
PROC W NO LENS (INTRAOCULAR LENS)
PROC W SPEC LENS (INTRAOCULAR LENS)
PROCESS W NO LENS (INTRAOCULAR LENS) IMPLANT
PROCESS W SPEC LENS (INTRAOCULAR LENS) IMPLANT
RING MALYGIN (MISCELLANEOUS) IMPLANT
SIGHTPATH CAT PROC W REG LENS (Ophthalmic Related) ×2 IMPLANT
SYR TB 1ML LL NO SAFETY (SYRINGE) ×2 IMPLANT
SYSTEM DUOVISC (INTRAOCULAR LENS) IMPLANT
TAPE CLOTH 1X10 TAN NS (GAUZE/BANDAGES/DRESSINGS) ×2 IMPLANT
VISCOELASTIC ADDITIONAL (OPHTHALMIC RELATED) IMPLANT
WATER STERILE IRR 250ML POUR (IV SOLUTION) ×2 IMPLANT

## 2011-06-29 NOTE — H&P (Signed)
Pre-Op Dx: Cataract OD Post-Op Dx: Cataract OD Surgeon: Zlatan Hornback Anesthesia: Topical with MAC Implant: Lenstec, Model Softec HD Blood Loss: None Specimen: None Complications: None 

## 2011-06-29 NOTE — Transfer of Care (Signed)
Immediate Anesthesia Transfer of Care Note  Patient: Cassandra Parker  Procedure(s) Performed:  CATARACT EXTRACTION PHACO AND INTRAOCULAR LENS PLACEMENT (IOC) - CDE: 14.41  Patient Location: PACU and Short Stay  Anesthesia Type: MAC  Level of Consciousness: awake, alert  and oriented  Airway & Oxygen Therapy: Patient Spontanous Breathing  Post-op Assessment: Report given to PACU RN  Post vital signs: Reviewed and stable  Complications: No apparent anesthesia complications

## 2011-06-29 NOTE — Op Note (Signed)
Cassandra Parker, Cassandra Parker NO.:  [id number]  MEDICAL RECORD NO.:  [id number]  LOCATION:  [facility]                          FACILITY:  [facility]  PHYSICIAN:  Richardo Hanks, MD       DATE OF BIRTH:  10-May-1941  DATE OF PROCEDURE:  06/29/2011 DATE OF DISCHARGE:  06/29/2011                              OPERATIVE REPORT   PREOPERATIVE DIAGNOSIS:  Combined cataract right eye, diagnosis code 366.19  POSTOPERATIVE DIAGNOSIS:  Combined cataract right eye, diagnosis code 366.19.  OPERATION PERFORMED:  Phacoemulsification with posterior chamber intraocular lens implantation, right eye.  SURGEON:  Franky Macho. Tavie Haseman, MD  ANESTHESIA:  Local, MAC.  OPERATIVE SUMMARY:  In the preoperative area, dilating drops were placed into the right eye.  The patient was then brought into the operating room where she was placed under general anesthesia.  The eye was then prepped and draped.  Beginning with a 75 blade, a paracentesis port was made at the surgeon's 2 o'clock position.  The anterior chamber was then filled with a 1% nonpreserved lidocaine solution with epinephrine.  This was followed by Viscoat to deepen the chamber.  A small fornix-based peritomy was performed superiorly.  Next, a single iris hook was placed through the limbus superiorly.  A 2.4-mm keratome blade was then used to make a clear corneal incision over the iris hook.  A bent cystotome needle and Utrata forceps were used to create a continuous tear capsulotomy.  Hydrodissection was performed using balanced salt solution on a fine cannula.  The lens nucleus was then removed using phacoemulsification in a quadrant cracking technique.  The cortical material was then removed with irrigation and aspiration.  The capsular bag and anterior chamber were refilled with Provisc.  The wound was widened to approximately 3 mm and a posterior chamber intraocular lens was placed into the capsular bag without difficulty using an [employer]  lens injecting system.  A single 10-0 nylon suture was then used to close the incision as well as stromal hydration.  The Provisc was removed from the anterior chamber and capsular bag with irrigation and aspiration.  At this point, the wounds were tested for leak, which were negative.  The anterior chamber remained deep and stable.  The patient tolerated the procedure well.  There were no operative complications, and she awoke from general anesthesia without problem.  Prosthetic device used is a Brewing technologist, model Softec HD, power of 21.75, serial number is [id number].  No surgical specimens.          ______________________________ Richardo Hanks, MD     KEH/MEDQ  D:  06/29/2011  T:  06/29/2011  Job:  [id number]

## 2011-06-29 NOTE — Anesthesia Preprocedure Evaluation (Addendum)
Anesthesia Evaluation  Name, MR# and DOB Patient awake  General Assessment Comment  Reviewed: Allergy & Precautions, H&P , NPO status , Patient's Chart, lab work & pertinent test results  History of Anesthesia Complications Negative for: history of anesthetic complications  Airway Mallampati: II      Dental  (+) Edentulous Upper   Pulmonary    Pulmonary exam normal       Cardiovascular hypertension, Pt. on medications Regular Normal    Neuro/Psych    GI/Hepatic negative GI ROS   Endo/Other  Diabetes mellitus-, Well Controlled, Type 2, Oral Hypoglycemic Agents  Renal/GU      Musculoskeletal   Abdominal   Peds  Hematology   Anesthesia Other Findings   Reproductive/Obstetrics                           Anesthesia Physical Anesthesia Plan  ASA: III  Anesthesia Plan: MAC   Post-op Pain Management:    Induction:   Airway Management Planned: Nasal Cannula  Additional Equipment:   Intra-op Plan:   Post-operative Plan:   Informed Consent: I have reviewed the patients History and Physical, chart, labs and discussed the procedure including the risks, benefits and alternatives for the proposed anesthesia with the patient or authorized representative who has indicated his/her understanding and acceptance.     Plan Discussed with:   Anesthesia Plan Comments:         Anesthesia Quick Evaluation

## 2011-06-29 NOTE — Brief Op Note (Signed)
Pre-Op Dx: Cataract OD Post-Op Dx: Cataract OD Surgeon: Seren Chaloux Anesthesia: Topical with MAC Implant: Lenstec, Model Softec HD Blood Loss: None Specimen: None Complications: None 

## 2011-06-29 NOTE — Anesthesia Postprocedure Evaluation (Signed)
  Anesthesia Post-op Note  Patient: Cassandra Parker  Procedure(s) Performed:  CATARACT EXTRACTION PHACO AND INTRAOCULAR LENS PLACEMENT (IOC) - CDE: 14.41  Patient Location: PACU  Anesthesia Type: MAC  Level of Consciousness: awake, alert  and oriented  Airway and Oxygen Therapy: Patient Spontanous Breathing  Post-op Pain: none  Post-op Assessment: Post-op Vital signs reviewed, Patient's Cardiovascular Status Stable, Respiratory Function Stable and No signs of Nausea or vomiting  Post-op Vital Signs: Reviewed and stable  Complications: No apparent anesthesia complications

## 2011-07-06 ENCOUNTER — Encounter (HOSPITAL_COMMUNITY): Payer: Self-pay | Admitting: Ophthalmology

## 2011-08-28 ENCOUNTER — Encounter (HOSPITAL_COMMUNITY): Payer: Self-pay | Admitting: Pharmacy Technician

## 2011-08-30 ENCOUNTER — Encounter (HOSPITAL_COMMUNITY): Payer: Self-pay

## 2011-08-30 ENCOUNTER — Encounter (HOSPITAL_COMMUNITY)
Admission: RE | Admit: 2011-08-30 | Discharge: 2011-08-30 | Disposition: A | Discharge status: Home / Self Care | Payer: Medicare Other | Source: Ambulatory Visit | Attending: Ophthalmology | Admitting: Ophthalmology

## 2011-08-30 LAB — CBC
MCH: 30.6 pg (ref 26.0–34.0)
MCHC: 33.3 g/dL (ref 30.0–36.0)
MCV: 91.9 fL (ref 78.0–100.0)
Platelets: 248 10*3/uL (ref 150–400)
RDW: 13 % (ref 11.5–15.5)
WBC: 7.8 10*3/uL (ref 4.0–10.5)

## 2011-08-30 LAB — BASIC METABOLIC PANEL
Calcium: 9.6 mg/dL (ref 8.4–10.5)
Chloride: 108 mEq/L (ref 96–112)
Creatinine, Ser: 1.42 mg/dL — ABNORMAL HIGH (ref 0.50–1.10)
GFR calc Af Amer: 43 mL/min — ABNORMAL LOW (ref >90)
GFR calc non Af Amer: 37 mL/min — ABNORMAL LOW (ref >90)

## 2011-08-30 NOTE — Patient Instructions (Signed)
[name]  08/30/2011   Your procedure is scheduled on:  09/04/2011  Report to [facility] at 07:30 AM.  Call this number if you have problems the morning of surgery: [phone]   Remember:   Do not eat food:After Midnight.  May have clear liquids:until Midnight .  Clear liquids include soda, tea, black coffee, apple or grape juice, broth.  Take these medicines the morning of surgery with A SIP OF WATER: Carvedilol and Lisinopril   Do not wear jewelry, make-up or nail polish.  Do not wear lotions, powders, or perfumes. You may wear deodorant.  Do not shave 48 hours prior to surgery.  Do not bring valuables to the hospital.  Contacts, dentures or bridgework may not be worn into surgery.  Leave suitcase in the car. After surgery it may be brought to your room.  For patients admitted to the hospital, checkout time is 11:00 AM the day of discharge.   Patients discharged the day of surgery will not be allowed to drive home.  Name and phone number of your driver:   Special Instructions: N/A   Please read over the following fact sheets that you were given: Anesthesia Post-op Instructions     Cataract A cataract is a clouding of the lens of the eye. It is most often related to aging. A cataract is not a "film" over the surface of the eye. The lens is inside the eye and changes size of the pupil. The lens can enlarge to let more light enter the eye in dark environments and contract the size of the pupil to let in bright light. The lens is the part of the eye that helps focus light on the retina. The retina is the eye's light-sensitive layer. It is in the back of the eye that sends visual signals to the brain. In a normal eye, light passes through the lens and gets focused on the retina. To help produce a sharp image, the lens must remain clear. When a lens becomes cloudy, vision is compromised by the degree and nature of the clouding. Certain cataracts make people more near-sighted as  they develop, others increase glare, and all reduce vision to some degree or another. A cataract that is so dense that it becomes milky Adrian Specht and a Mackinze Criado opacity can be seen through the pupil. When the Lalah Durango color is seen, it is called a "mature" or "hyper-mature cataract." Such cataracts cause total blindness in the affected eye. The cataract must be removed to prevent damage to the eye itself. Some types of cataracts can cause a secondary disease of the eye, such as certain types of glaucoma. In the early stages, better lighting and eyeglasses may lessen vision problems caused by cataracts. At a certain point, surgery may be needed to improve vision. CAUSES   Aging. However, cataracts may occur at any age, even in newborns.   Certain drugs.   Trauma to the eye.   Certain diseases (such as diabetes).   Inherited or acquired medical syndromes.  SYMPTOMS   Gradual, progressive drop in vision in the affected eye. Cataracts may develop at different rates in each eye. Cataracts may even be in just one eye with the other unaffected.   Cataracts due to trauma may develop quickly, sometimes over a matter or days or even hours. The result is severe and rapid visual loss.  DIAGNOSIS  To detect a cataract, an eye doctor examines the lens. A well developed cataract can be diagnosed without  dilating the pupil. Early cataracts and others of a specific nature are best diagnosed with an exam of the eyes with the pupils dilated by drops. TREATMENT   For an early cataract, vision may improve by using different eyeglasses or stronger lighting.   If the above measures do not help, surgery is the only effective treatment. This treatment removes the cloudy lens and replaces it with a substitute lens (Intraocular lens, or IOL). Newly developed IOL technology allows the implanted lens to improve vision both at a distance and up close. Discuss with your eye surgeon about the possibility of still needing glasses.  Also discuss how visual coordination between both eyes will be affected.  A cataract needs to be removed only when vision loss interferes with your everyday activities such as driving, reading or watching TV. You and your eye doctor can make that decision together. In most cases, waiting until you are ready to have cataract surgery will not harm your eye. If you have cataracts in both eyes, only one should be removed at a time. This allows the operated eye to heal and be out of danger from serious problems (such as infection or poor wound healing) before having the other eye undergo surgery.  Sometimes, a cataract should be removed even if it does not cause problems with your vision. For example, a cataract should be removed if it prevents examination or treatment of another eye problem. Just as you cannot see out of the affected eye well, your doctor cannot see into your eye well through a cataract. The vast majority of people who have cataract surgery have better vision afterward. CATARACT REMOVAL There are two primary ways to remove a cataract. Your doctor can explain the differences and help determine which is best for you:  Phacoemulsification (small incision cataract surgery). This involves making a small cut (incision) on the edge of the clear, dome-shaped surface that covers the front of the eye (the cornea). An injection behind the eye or eye drops are given to make this a painless procedure. The doctor then inserts a tiny probe into the eye. This device emits ultrasound waves that soften and break up the cloudy center of the lens so it can be removed by suction. Most cataract surgery is done this way. The cuts are usually so small and performed in such a manner that often no sutures are needed to keep it closed.   Extracapsular surgery. Your doctor makes a slightly longer incision on the side of the cornea. The doctor removes the hard center of the lens. The remainder of the lens is then removed  by suction. In some cases, extremely fine sutures are needed which the doctor may, or may not remove in the office after the surgery.  When an IOL is implanted, it needs no care. It becomes a permanent part of your eye and cannot be seen or felt.  Some people cannot have an IOL. They may have problems during surgery, or maybe they have another eye disease. For these people, a soft contact lens may be suggested. If an IOL or contact lens cannot be used, very powerful and thick glasses are required after surgery. Since vision is very different through such thick glasses, it is important to have your doctor discuss the impact on your vision after any cataract surgery where there is no plan to implant an IOL. The normal lens of the eye is covered by a clear capsule. Both phacoemulsification and extracapsular surgery require that the back  surface of this lens capsule be left in place. This helps support IOLs and prevents the IOL from dislocating and falling back into the deeper interior of the eye. Right after surgery, and often permanently this "posterior capsule" remains clear. In some cases however, it can become cloudy, presenting the same type of visual compromise that the original cataract did since light is again obstructed as it passes through the clear IOL. This condition is often referred to as an "after-cataract." Fortunately, after-cataracts are easily treated using a painless and very fast laser treatment that is performed without anesthesia or incisions. It is done in a matter of minutes in an outpatient environment. Visual improvement is often immediate.  HOME CARE INSTRUCTIONS   Your surgeon will discuss pre and post operative care with you prior to surgery. The majority of people are able to do almost all normal activities right away. Although, it is often advised to avoid strenuous activity for a period of time.   Postoperative drops and careful avoidance of infection will be needed. Many  surgeons suggest the use of a protective shield during the first few days after surgery.   There is a very small incidence of complication from modern cataract surgery, but it can happen. Infection that spreads to the inside of the eye (endophthalmitis) can result in total visual loss and even loss of the eye itself. In extremely rare instances, the inflammation of endophthalmitis can spread to both eyes (sympathetic ophthalmia). Appropriate post-operative care under the close observation of your surgeon is essential to a successful outcome.  SEEK IMMEDIATE MEDICAL CARE IF:   You have any sudden drop of vision in the operated eye.   You have pain in the operated eye.   You see a large number of floating dots in the field of vision in the operated eye.   You see flashing lights, or if a portion of your side vision in any direction appears black (like a curtain being drawn into your field of vision) in the operated eye.  Document Released: 09/11/2005 Document Revised: 05/24/2011 Document Reviewed: 10/28/2007 [facility] Patient Information 2012 [place].    PATIENT INSTRUCTIONS POST-ANESTHESIA  IMMEDIATELY FOLLOWING SURGERY:  Do not drive or operate machinery for the first twenty four hours after surgery.  Do not make any important decisions for twenty four hours after surgery or while taking narcotic pain medications or sedatives.  If you develop intractable nausea and vomiting or a severe headache please notify your doctor immediately.  FOLLOW-UP:  Please make an appointment with your surgeon as instructed. You do not need to follow up with anesthesia unless specifically instructed to do so.  WOUND CARE INSTRUCTIONS (if applicable):  Keep a dry clean dressing on the anesthesia/puncture wound site if there is drainage.  Once the wound has quit draining you may leave it open to air.  Generally you should leave the bandage intact for twenty four hours unless there is drainage.  If the  epidural site drains for more than 36-48 hours please call the anesthesia department.  QUESTIONS?:  Please feel free to call your physician or the hospital operator if you have any questions, and they will be happy to assist you.     [place] [phone]

## 2011-09-04 ENCOUNTER — Encounter (HOSPITAL_COMMUNITY): Payer: Self-pay | Admitting: *Deleted

## 2011-09-04 ENCOUNTER — Encounter (HOSPITAL_COMMUNITY): Payer: Self-pay | Admitting: Anesthesiology

## 2011-09-04 ENCOUNTER — Ambulatory Visit (HOSPITAL_COMMUNITY)
Admission: RE | Admit: 2011-09-04 | Discharge: 2011-09-04 | Disposition: A | Discharge status: Home / Self Care | Payer: Medicare Other | Source: Ambulatory Visit | Attending: Ophthalmology | Admitting: Ophthalmology

## 2011-09-04 ENCOUNTER — Ambulatory Visit (HOSPITAL_COMMUNITY): Payer: Medicare Other | Admitting: Anesthesiology

## 2011-09-04 ENCOUNTER — Encounter (HOSPITAL_COMMUNITY)
Admission: RE | Disposition: A | Discharge status: Home / Self Care | Payer: Self-pay | Source: Ambulatory Visit | Attending: Ophthalmology

## 2011-09-04 DIAGNOSIS — H2589 Other age-related cataract: Secondary | ICD-10-CM | POA: Insufficient documentation

## 2011-09-04 DIAGNOSIS — E119 Type 2 diabetes mellitus without complications: Secondary | ICD-10-CM | POA: Insufficient documentation

## 2011-09-04 DIAGNOSIS — Z01812 Encounter for preprocedural laboratory examination: Secondary | ICD-10-CM | POA: Insufficient documentation

## 2011-09-04 DIAGNOSIS — I10 Essential (primary) hypertension: Secondary | ICD-10-CM | POA: Insufficient documentation

## 2011-09-04 HISTORY — PX: CATARACT EXTRACTION W/PHACO: SHX586

## 2011-09-04 LAB — GLUCOSE, CAPILLARY: Glucose-Capillary: 169 mg/dL — ABNORMAL HIGH (ref 70–99)

## 2011-09-04 SURGERY — PHACOEMULSIFICATION, CATARACT, WITH IOL INSERTION
Anesthesia: Monitor Anesthesia Care | Site: Eye | Laterality: Left | Wound class: Clean

## 2011-09-04 MED ORDER — NEOMYCIN-POLYMYXIN-DEXAMETH 3.5-10000-0.1 OP OINT
TOPICAL_OINTMENT | OPHTHALMIC | Status: AC
  Filled 2011-09-04: qty 3.5

## 2011-09-04 MED ORDER — LIDOCAINE HCL 3.5 % OP GEL
OPHTHALMIC | Status: AC
  Administered 2011-09-04: 1 via OPHTHALMIC
  Filled 2011-09-04: qty 5

## 2011-09-04 MED ORDER — EPINEPHRINE HCL 1 MG/ML IJ SOLN
INTRAMUSCULAR | Status: AC
  Filled 2011-09-04: qty 1

## 2011-09-04 MED ORDER — TETRACAINE HCL 0.5 % OP SOLN
1.0000 [drp] | OPHTHALMIC | Status: AC
  Administered 2011-09-04 (×3): 1 [drp] via OPHTHALMIC

## 2011-09-04 MED ORDER — BSS IO SOLN
INTRAOCULAR | Status: DC | PRN
  Administered 2011-09-04: 15 mL via INTRAOCULAR

## 2011-09-04 MED ORDER — POVIDONE-IODINE 5 % OP SOLN
OPHTHALMIC | Status: DC | PRN
  Administered 2011-09-04: 1 via OPHTHALMIC

## 2011-09-04 MED ORDER — NEOMYCIN-POLYMYXIN-DEXAMETH 0.1 % OP OINT
TOPICAL_OINTMENT | OPHTHALMIC | Status: DC | PRN
  Administered 2011-09-04: 1 via OPHTHALMIC

## 2011-09-04 MED ORDER — MIDAZOLAM HCL 2 MG/2ML IJ SOLN
INTRAMUSCULAR | Status: AC
  Administered 2011-09-04: 2 mg via INTRAVENOUS
  Filled 2011-09-04: qty 2

## 2011-09-04 MED ORDER — LIDOCAINE HCL (PF) 1 % IJ SOLN
INTRAMUSCULAR | Status: AC
  Filled 2011-09-04: qty 2

## 2011-09-04 MED ORDER — PHENYLEPHRINE HCL 2.5 % OP SOLN
OPHTHALMIC | Status: AC
  Administered 2011-09-04: 1 [drp] via OPHTHALMIC
  Filled 2011-09-04: qty 2

## 2011-09-04 MED ORDER — PROVISC 10 MG/ML IO SOLN
INTRAOCULAR | Status: DC | PRN
  Administered 2011-09-04: 8.5 mg via INTRAOCULAR

## 2011-09-04 MED ORDER — CYCLOPENTOLATE-PHENYLEPHRINE 0.2-1 % OP SOLN
OPHTHALMIC | Status: AC
  Administered 2011-09-04: 1 [drp] via OPHTHALMIC
  Filled 2011-09-04: qty 2

## 2011-09-04 MED ORDER — EPINEPHRINE HCL 1 MG/ML IJ SOLN
INTRAMUSCULAR | Status: DC | PRN
  Administered 2011-09-04: 09:00:00

## 2011-09-04 MED ORDER — LIDOCAINE HCL 3.5 % OP GEL
1.0000 "application " | Freq: Once | OPHTHALMIC | Status: AC
  Administered 2011-09-04: 1 via OPHTHALMIC

## 2011-09-04 MED ORDER — MIDAZOLAM HCL 2 MG/2ML IJ SOLN
1.0000 mg | INTRAMUSCULAR | Status: DC | PRN
  Administered 2011-09-04: 2 mg via INTRAVENOUS

## 2011-09-04 MED ORDER — LACTATED RINGERS IV SOLN
INTRAVENOUS | Status: DC
  Administered 2011-09-04: 09:00:00 via INTRAVENOUS

## 2011-09-04 MED ORDER — LIDOCAINE 3.5 % OP GEL OPTIME - NO CHARGE
OPHTHALMIC | Status: DC | PRN
  Administered 2011-09-04: 1 [drp] via OPHTHALMIC

## 2011-09-04 MED ORDER — PHENYLEPHRINE HCL 2.5 % OP SOLN
1.0000 [drp] | OPHTHALMIC | Status: AC
  Administered 2011-09-04 (×3): 1 [drp] via OPHTHALMIC

## 2011-09-04 MED ORDER — LIDOCAINE HCL (PF) 1 % IJ SOLN
INTRAMUSCULAR | Status: DC | PRN
  Administered 2011-09-04: .3 mL

## 2011-09-04 MED ORDER — CYCLOPENTOLATE-PHENYLEPHRINE 0.2-1 % OP SOLN
1.0000 [drp] | OPHTHALMIC | Status: AC
  Administered 2011-09-04 (×3): 1 [drp] via OPHTHALMIC

## 2011-09-04 MED ORDER — TETRACAINE HCL 0.5 % OP SOLN
OPHTHALMIC | Status: AC
  Administered 2011-09-04: 1 [drp] via OPHTHALMIC
  Filled 2011-09-04: qty 2

## 2011-09-04 SURGICAL SUPPLY — 33 items
CAPSULAR TENSION RING-AMO (OPHTHALMIC RELATED) IMPLANT
CLOTH BEACON ORANGE TIMEOUT ST (SAFETY) ×2 IMPLANT
EYE SHIELD UNIVERSAL CLEAR (GAUZE/BANDAGES/DRESSINGS) ×2 IMPLANT
GLOVE BIO SURGEON STRL SZ 6.5 (GLOVE) IMPLANT
GLOVE BIOGEL PI IND STRL 6.5 (GLOVE) ×1 IMPLANT
GLOVE BIOGEL PI IND STRL 7.0 (GLOVE) ×1 IMPLANT
GLOVE BIOGEL PI IND STRL 7.5 (GLOVE) IMPLANT
GLOVE BIOGEL PI INDICATOR 6.5 (GLOVE) ×1
GLOVE BIOGEL PI INDICATOR 7.0 (GLOVE) ×1
GLOVE BIOGEL PI INDICATOR 7.5 (GLOVE)
GLOVE ECLIPSE 6.5 STRL STRAW (GLOVE) IMPLANT
GLOVE ECLIPSE 7.0 STRL STRAW (GLOVE) IMPLANT
GLOVE ECLIPSE 7.5 STRL STRAW (GLOVE) IMPLANT
GLOVE EXAM NITRILE LRG STRL (GLOVE) IMPLANT
GLOVE EXAM NITRILE MD LF STRL (GLOVE) ×2 IMPLANT
GLOVE SKINSENSE NS SZ6.5 (GLOVE)
GLOVE SKINSENSE NS SZ7.0 (GLOVE)
GLOVE SKINSENSE STRL SZ6.5 (GLOVE) IMPLANT
GLOVE SKINSENSE STRL SZ7.0 (GLOVE) IMPLANT
GOWN STRL REIN XL XLG (GOWN DISPOSABLE) ×2 IMPLANT
KIT VITRECTOMY (OPHTHALMIC RELATED) IMPLANT
PAD ARMBOARD 7.5X6 YLW CONV (MISCELLANEOUS) ×2 IMPLANT
PROC W NO LENS (INTRAOCULAR LENS)
PROC W SPEC LENS (INTRAOCULAR LENS)
PROCESS W NO LENS (INTRAOCULAR LENS) IMPLANT
PROCESS W SPEC LENS (INTRAOCULAR LENS) IMPLANT
RING MALYGIN (MISCELLANEOUS) IMPLANT
SIGHTPATH CAT PROC W REG LENS (Ophthalmic Related) ×2 IMPLANT
SYR TB 1ML LL NO SAFETY (SYRINGE) ×2 IMPLANT
TAPE SURG TRANSPORE 1 IN (GAUZE/BANDAGES/DRESSINGS) ×1 IMPLANT
TAPE SURGICAL TRANSPORE 1 IN (GAUZE/BANDAGES/DRESSINGS) ×1
VISCOELASTIC ADDITIONAL (OPHTHALMIC RELATED) IMPLANT
WATER STERILE IRR 250ML POUR (IV SOLUTION) ×2 IMPLANT

## 2011-09-04 NOTE — Brief Op Note (Signed)
Pre-Op Dx: Cataract OS Post-Op Dx: Cataract OS Surgeon: Tonny Branch Anesthesia: Topical with MAC Implant: B&L enVista Specimen: None Complications: None

## 2011-09-04 NOTE — Op Note (Signed)
NAMEROSAMARIA, Cassandra Parker NO.:  [id number]  MEDICAL RECORD NO.:  [id number]  LOCATION:  [facility]                          FACILITY:  [facility]  PHYSICIAN:  Richardo Hanks, MD       DATE OF BIRTH:  Oct 15, 1940  DATE OF PROCEDURE:  09/04/2011 DATE OF DISCHARGE:  09/04/2011                              OPERATIVE REPORT   PREOPERATIVE DIAGNOSIS:  Combined cataract, left eye, diagnosis code 366.19.  POSTOPERATIVE DIAGNOSIS:  Combined cataract, left eye, diagnosis code 366.19.  OPERATION PERFORMED:  Phacoemulsification with posterior chamber intraocular lens implantation, left eye.  SURGEON:  Franky Macho. [name], MD  ANESTHESIA:  General endotracheal anesthesia.  OPERATIVE SUMMARY:  In the preoperative area, dilating drops were placed into the left eye.  The patient was then brought into the operating room where she was placed under general anesthesia.  The eye was then prepped and draped.  Beginning with a 75 blade, a paracentesis port was made at the surgeon's 2 o'clock position.  The anterior chamber was then filled with a 1% nonpreserved lidocaine solution with epinephrine.  This was followed by Viscoat to deepen the chamber.  A small fornix-based peritomy was performed superiorly.  Next, a single iris hook was placed through the limbus superiorly.  A 2.4-mm keratome blade was then used to make a clear corneal incision over the iris hook.  A bent cystotome needle and Utrata forceps were used to create a continuous tear capsulotomy.  Hydrodissection was performed using balanced salt solution on a fine cannula.  The lens nucleus was then removed using phacoemulsification in a quadrant cracking technique.  The cortical material was then removed with irrigation and aspiration.  The capsular bag and anterior chamber were refilled with Provisc.  The wound was widened to approximately 3 mm and a posterior chamber intraocular lens was placed into the capsular bag without difficulty  using an [employer] lens injecting system.  A single 10-0 nylon suture was then used to close the incision as well as stromal hydration.  The Provisc was removed from the anterior chamber and capsular bag with irrigation and aspiration.  At this point, the wounds were tested for leak, which were negative.  The anterior chamber remained deep and stable.  The patient tolerated the procedure well.  There were no operative complications, and she awoke from general anesthesia without problem.  No surgical specimens.  Prosthetic device used is a [employer]'s enVista posterior chamber lens, model MX60, power of 24.0, serial number is [id number].          ______________________________ Richardo Hanks, MD     KEH/MEDQ  D:  09/04/2011  T:  09/04/2011  Job:  [id number]

## 2011-09-04 NOTE — H&P (Signed)
I have reviewed the H&P, the patient was re-examined, and I have identified no interval changes in medical condition and plan of care since the history and physical of record  

## 2011-09-04 NOTE — Anesthesia Preprocedure Evaluation (Signed)
Anesthesia Evaluation  Patient identified by MRN, date of birth, ID band Patient awake    Reviewed: Allergy & Precautions, H&P , NPO status , Patient's Chart, lab work & pertinent test results  History of Anesthesia Complications Negative for: history of anesthetic complications  Airway Mallampati: II      Dental  (+) Edentulous Upper   Pulmonary neg pulmonary ROS,    Pulmonary exam normal       Cardiovascular hypertension, Pt. on medications Regular Normal    Neuro/Psych    GI/Hepatic negative GI ROS,   Endo/Other  Diabetes mellitus-, Well Controlled, Type 2, Oral Hypoglycemic Agents  Renal/GU      Musculoskeletal   Abdominal   Peds  Hematology   Anesthesia Other Findings   Reproductive/Obstetrics                           Anesthesia Physical Anesthesia Plan  ASA: III  Anesthesia Plan: MAC   Post-op Pain Management:    Induction: Intravenous  Airway Management Planned: Nasal Cannula  Additional Equipment:   Intra-op Plan:   Post-operative Plan:   Informed Consent: I have reviewed the patients History and Physical, chart, labs and discussed the procedure including the risks, benefits and alternatives for the proposed anesthesia with the patient or authorized representative who has indicated his/her understanding and acceptance.     Plan Discussed with:   Anesthesia Plan Comments:         Anesthesia Quick Evaluation

## 2011-09-04 NOTE — Anesthesia Postprocedure Evaluation (Signed)
  Anesthesia Post-op Note  Patient: Cassandra Parker  Procedure(s) Performed:  CATARACT EXTRACTION PHACO AND INTRAOCULAR LENS PLACEMENT (IOC) - CDE: 15.21  Patient Location: PACU and Short Stay  Anesthesia Type: MAC  Level of Consciousness: awake  Airway and Oxygen Therapy: Patient Spontanous Breathing  Post-op Pain: none  Post-op Assessment: Post-op Vital signs reviewed, Patient's Cardiovascular Status Stable, Respiratory Function Stable and No signs of Nausea or vomiting  Post-op Vital Signs: Reviewed and stable  Complications: No apparent anesthesia complications

## 2011-09-04 NOTE — Transfer of Care (Signed)
Immediate Anesthesia Transfer of Care Note  Patient: Cassandra Parker  Procedure(s) Performed:  CATARACT EXTRACTION PHACO AND INTRAOCULAR LENS PLACEMENT (IOC) - CDE: 15.21  Patient Location: PACU and Short Stay  Anesthesia Type: MAC  Level of Consciousness: awake, alert  and oriented  Airway & Oxygen Therapy: Patient Spontanous Breathing  Post-op Assessment: Report given to PACU RN  Post vital signs: Reviewed and stable  Complications: No apparent anesthesia complications

## 2011-09-11 ENCOUNTER — Encounter (HOSPITAL_COMMUNITY): Payer: Self-pay | Admitting: Ophthalmology

## 2012-06-20 ENCOUNTER — Encounter (INDEPENDENT_AMBULATORY_CARE_PROVIDER_SITE_OTHER): Payer: Medicare Other | Admitting: Hematology and Oncology

## 2012-06-20 DIAGNOSIS — I10 Essential (primary) hypertension: Secondary | ICD-10-CM

## 2012-06-20 DIAGNOSIS — E785 Hyperlipidemia, unspecified: Secondary | ICD-10-CM

## 2012-06-20 DIAGNOSIS — C50919 Malignant neoplasm of unspecified site of unspecified female breast: Secondary | ICD-10-CM

## 2012-06-20 DIAGNOSIS — E119 Type 2 diabetes mellitus without complications: Secondary | ICD-10-CM

## 2013-01-20 ENCOUNTER — Encounter: Payer: Medicare Other | Admitting: Internal Medicine

## 2013-01-20 DIAGNOSIS — E2839 Other primary ovarian failure: Secondary | ICD-10-CM

## 2013-01-20 DIAGNOSIS — C50919 Malignant neoplasm of unspecified site of unspecified female breast: Secondary | ICD-10-CM

## 2013-01-20 DIAGNOSIS — Z171 Estrogen receptor negative status [ER-]: Secondary | ICD-10-CM

## 2013-07-17 ENCOUNTER — Encounter (INDEPENDENT_AMBULATORY_CARE_PROVIDER_SITE_OTHER): Payer: Medicare Other

## 2013-07-17 DIAGNOSIS — R944 Abnormal results of kidney function studies: Secondary | ICD-10-CM

## 2013-07-17 DIAGNOSIS — C50919 Malignant neoplasm of unspecified site of unspecified female breast: Secondary | ICD-10-CM

## 2013-07-17 DIAGNOSIS — Z9010 Acquired absence of unspecified breast and nipple: Secondary | ICD-10-CM

## 2013-07-17 DIAGNOSIS — N189 Chronic kidney disease, unspecified: Secondary | ICD-10-CM

## 2013-10-13 ENCOUNTER — Inpatient Hospital Stay (HOSPITAL_COMMUNITY): Payer: Medicare HMO

## 2013-10-13 ENCOUNTER — Inpatient Hospital Stay (HOSPITAL_COMMUNITY)
Admission: AD | Admit: 2013-10-13 | Discharge: 2013-10-16 | DRG: 242 | Disposition: A | Discharge status: Home / Self Care | Payer: Medicare HMO | Source: Other Acute Inpatient Hospital | Attending: Internal Medicine | Admitting: Internal Medicine

## 2013-10-13 ENCOUNTER — Encounter (HOSPITAL_COMMUNITY)
Admission: AD | Disposition: A | Discharge status: Home / Self Care | Payer: Self-pay | Source: Other Acute Inpatient Hospital | Attending: Internal Medicine

## 2013-10-13 ENCOUNTER — Encounter: Payer: Self-pay | Admitting: Cardiology

## 2013-10-13 DIAGNOSIS — E785 Hyperlipidemia, unspecified: Secondary | ICD-10-CM | POA: Diagnosis present

## 2013-10-13 DIAGNOSIS — I5022 Chronic systolic (congestive) heart failure: Secondary | ICD-10-CM | POA: Diagnosis present

## 2013-10-13 DIAGNOSIS — I442 Atrioventricular block, complete: Principal | ICD-10-CM | POA: Diagnosis present

## 2013-10-13 DIAGNOSIS — I509 Heart failure, unspecified: Secondary | ICD-10-CM | POA: Diagnosis present

## 2013-10-13 DIAGNOSIS — R55 Syncope and collapse: Secondary | ICD-10-CM | POA: Diagnosis present

## 2013-10-13 DIAGNOSIS — E1149 Type 2 diabetes mellitus with other diabetic neurological complication: Secondary | ICD-10-CM | POA: Diagnosis present

## 2013-10-13 DIAGNOSIS — E7800 Pure hypercholesterolemia, unspecified: Secondary | ICD-10-CM | POA: Diagnosis present

## 2013-10-13 DIAGNOSIS — W19XXXA Unspecified fall, initial encounter: Secondary | ICD-10-CM | POA: Diagnosis present

## 2013-10-13 DIAGNOSIS — C50919 Malignant neoplasm of unspecified site of unspecified female breast: Secondary | ICD-10-CM | POA: Diagnosis present

## 2013-10-13 DIAGNOSIS — N289 Disorder of kidney and ureter, unspecified: Secondary | ICD-10-CM | POA: Diagnosis present

## 2013-10-13 DIAGNOSIS — N170 Acute kidney failure with tubular necrosis: Secondary | ICD-10-CM | POA: Diagnosis not present

## 2013-10-13 DIAGNOSIS — I428 Other cardiomyopathies: Secondary | ICD-10-CM | POA: Diagnosis present

## 2013-10-13 DIAGNOSIS — I4949 Other premature depolarization: Secondary | ICD-10-CM | POA: Diagnosis present

## 2013-10-13 DIAGNOSIS — Y92009 Unspecified place in unspecified non-institutional (private) residence as the place of occurrence of the external cause: Secondary | ICD-10-CM

## 2013-10-13 DIAGNOSIS — I429 Cardiomyopathy, unspecified: Secondary | ICD-10-CM | POA: Diagnosis present

## 2013-10-13 DIAGNOSIS — I050 Rheumatic mitral stenosis: Secondary | ICD-10-CM | POA: Diagnosis present

## 2013-10-13 DIAGNOSIS — E1142 Type 2 diabetes mellitus with diabetic polyneuropathy: Secondary | ICD-10-CM | POA: Diagnosis present

## 2013-10-13 DIAGNOSIS — I10 Essential (primary) hypertension: Secondary | ICD-10-CM | POA: Diagnosis present

## 2013-10-13 DIAGNOSIS — E119 Type 2 diabetes mellitus without complications: Secondary | ICD-10-CM

## 2013-10-13 DIAGNOSIS — Z9849 Cataract extraction status, unspecified eye: Secondary | ICD-10-CM

## 2013-10-13 DIAGNOSIS — R42 Dizziness and giddiness: Secondary | ICD-10-CM | POA: Diagnosis not present

## 2013-10-13 DIAGNOSIS — Z853 Personal history of malignant neoplasm of breast: Secondary | ICD-10-CM

## 2013-10-13 DIAGNOSIS — Z9010 Acquired absence of unspecified breast and nipple: Secondary | ICD-10-CM

## 2013-10-13 DIAGNOSIS — I447 Left bundle-branch block, unspecified: Secondary | ICD-10-CM | POA: Diagnosis present

## 2013-10-13 DIAGNOSIS — I959 Hypotension, unspecified: Secondary | ICD-10-CM | POA: Diagnosis not present

## 2013-10-13 HISTORY — DX: Type 2 diabetes mellitus without complications: E11.9

## 2013-10-13 HISTORY — DX: Malignant neoplasm of unspecified site of unspecified female breast: C50.919

## 2013-10-13 HISTORY — DX: Syncope and collapse: R55

## 2013-10-13 HISTORY — DX: Atrioventricular block, complete: I44.2

## 2013-10-13 HISTORY — DX: Cardiomyopathy, unspecified: I42.9

## 2013-10-13 HISTORY — DX: Heart failure, unspecified: I50.9

## 2013-10-13 HISTORY — PX: TEMPORARY PACEMAKER INSERTION: SHX5471

## 2013-10-13 HISTORY — DX: Hyperlipidemia, unspecified: E78.5

## 2013-10-13 LAB — URINALYSIS, ROUTINE W REFLEX MICROSCOPIC
Bilirubin Urine: NEGATIVE
Glucose, UA: NEGATIVE mg/dL
Ketones, ur: NEGATIVE mg/dL
Leukocytes, UA: NEGATIVE
Nitrite: NEGATIVE
Protein, ur: NEGATIVE mg/dL
Specific Gravity, Urine: 1.013 (ref 1.005–1.030)
Urobilinogen, UA: 0.2 mg/dL (ref 0.0–1.0)
pH: 5 (ref 5.0–8.0)

## 2013-10-13 LAB — MRSA PCR SCREENING: MRSA BY PCR: NEGATIVE

## 2013-10-13 LAB — GLUCOSE, CAPILLARY: Glucose-Capillary: 112 mg/dL — ABNORMAL HIGH (ref 70–99)

## 2013-10-13 LAB — URINE MICROSCOPIC-ADD ON

## 2013-10-13 LAB — CBC
HCT: 33.1 % — ABNORMAL LOW (ref 36.0–46.0)
Hemoglobin: 11.1 g/dL — ABNORMAL LOW (ref 12.0–15.0)
MCH: 30.5 pg (ref 26.0–34.0)
MCHC: 33.5 g/dL (ref 30.0–36.0)
MCV: 90.9 fL (ref 78.0–100.0)
Platelets: 216 10*3/uL (ref 150–400)
RBC: 3.64 MIL/uL — AB (ref 3.87–5.11)
RDW: 13.6 % (ref 11.5–15.5)
WBC: 10.7 10*3/uL — AB (ref 4.0–10.5)

## 2013-10-13 LAB — CREATININE, SERUM
CREATININE: 3.24 mg/dL — AB (ref 0.50–1.10)
GFR calc non Af Amer: 13 mL/min — ABNORMAL LOW (ref >90)
GFR, EST AFRICAN AMERICAN: 16 mL/min — AB (ref >90)

## 2013-10-13 LAB — PROTIME-INR
INR: 0.97 (ref 0.00–1.49)
Prothrombin Time: 12.7 seconds (ref 11.6–15.2)

## 2013-10-13 LAB — TROPONIN I: Troponin I: <0.3 ng/mL (ref <0.30)

## 2013-10-13 SURGERY — TEMPORARY PACEMAKER INSERTION
Anesthesia: LOCAL

## 2013-10-13 MED ORDER — OMEGA-3-ACID ETHYL ESTERS 1 G PO CAPS
1.0000 g | ORAL_CAPSULE | Freq: Two times a day (BID) | ORAL | Status: DC
  Administered 2013-10-13 – 2013-10-16 (×5): 1 g via ORAL
  Filled 2013-10-13 (×8): qty 1

## 2013-10-13 MED ORDER — ENOXAPARIN SODIUM 40 MG/0.4ML ~~LOC~~ SOLN: 40.0000 mg | SUBCUTANEOUS | Status: DC

## 2013-10-13 MED ORDER — ASPIRIN 81 MG PO CHEW
81.0000 mg | CHEWABLE_TABLET | Freq: Every day | ORAL | Status: DC
  Administered 2013-10-14 – 2013-10-16 (×3): 81 mg via ORAL
  Filled 2013-10-13 (×3): qty 1

## 2013-10-13 MED ORDER — SODIUM CHLORIDE 0.9 % IJ SOLN
3.0000 mL | Freq: Two times a day (BID) | INTRAMUSCULAR | Status: DC
  Administered 2013-10-13 – 2013-10-15 (×4): 3 mL via INTRAVENOUS

## 2013-10-13 MED ORDER — ONDANSETRON HCL 4 MG/2ML IJ SOLN
4.0000 mg | Freq: Four times a day (QID) | INTRAMUSCULAR | Status: DC | PRN
Start: 2013-10-13 — End: 2013-10-16

## 2013-10-13 MED ORDER — ENOXAPARIN SODIUM 30 MG/0.3ML ~~LOC~~ SOLN
30.0000 mg | Freq: Every day | SUBCUTANEOUS | Status: DC
  Administered 2013-10-13 – 2013-10-14 (×2): 30 mg via SUBCUTANEOUS
  Filled 2013-10-13 (×5): qty 0.3

## 2013-10-13 MED ORDER — INSULIN ASPART 100 UNIT/ML ~~LOC~~ SOLN
0.0000 [IU] | Freq: Three times a day (TID) | SUBCUTANEOUS | Status: DC
  Administered 2013-10-14 (×2): 2 [IU] via SUBCUTANEOUS

## 2013-10-13 MED ORDER — SODIUM CHLORIDE 0.9 % IJ SOLN: 3.0000 mL | INTRAMUSCULAR | Status: DC | PRN

## 2013-10-13 MED ORDER — SIMVASTATIN 10 MG PO TABS
10.0000 mg | ORAL_TABLET | Freq: Every day | ORAL | Status: DC
  Administered 2013-10-14: 10 mg via ORAL
  Filled 2013-10-13 (×4): qty 1

## 2013-10-13 MED ORDER — ACETAMINOPHEN 325 MG PO TABS
650.0000 mg | ORAL_TABLET | ORAL | Status: DC | PRN
Start: 2013-10-13 — End: 2013-10-16

## 2013-10-13 MED ORDER — LISINOPRIL 20 MG PO TABS
20.0000 mg | ORAL_TABLET | Freq: Every day | ORAL | Status: DC
  Filled 2013-10-13: qty 1

## 2013-10-13 MED ORDER — ZOLPIDEM TARTRATE 5 MG PO TABS: 5.0000 mg | ORAL_TABLET | Freq: Every evening | ORAL | Status: DC | PRN

## 2013-10-13 MED ORDER — ALPRAZOLAM 0.25 MG PO TABS: 0.2500 mg | ORAL_TABLET | Freq: Three times a day (TID) | ORAL | Status: DC | PRN

## 2013-10-13 MED ORDER — INSULIN ASPART 100 UNIT/ML ~~LOC~~ SOLN
0.0000 [IU] | Freq: Every day | SUBCUTANEOUS | Status: DC
  Administered 2013-10-14: 2 [IU] via SUBCUTANEOUS

## 2013-10-13 MED ORDER — HYDRALAZINE HCL 20 MG/ML IJ SOLN
10.0000 mg | Freq: Once | INTRAMUSCULAR | Status: AC
  Administered 2013-10-13: 10 mg via INTRAVENOUS
  Filled 2013-10-13: qty 1

## 2013-10-13 MED ORDER — SODIUM CHLORIDE 0.9 % IV SOLN
250.0000 mL | INTRAVENOUS | Status: DC | PRN
Start: 2013-10-13 — End: 2013-10-16

## 2013-10-13 NOTE — CV Procedure (Signed)
Cardiac Cath Procedure Note:  Indication:  Complete heart block with syncope  Procedures performed:  1) Transvenous pacer  Description of procedure:   The risks and indication of the procedure were explained. Consent was signed and placed on the chart. An appropriate timeout was taken prior to the procedure. The right neck was prepped and draped in the routine sterile fashion and anesthetized with 1% local lidocaine.   A 6 FR venous sheath was placed in the right internal jugular vein using a modified Seldinger technique. A temporary pacing wire was placed in the RV apex under fluoroscopic guidance. Threshold < 61mA.   Complications: None apparent.   Plan/Discussion:  Successful TVP for symptomatic CHB. EP to see in am regarding need for PPM. Monitor in ICU overnight.  Messiah Rovira 6:19 PM

## 2013-10-13 NOTE — H&P (Signed)
Error

## 2013-10-13 NOTE — Progress Notes (Signed)
ANTICOAGULATION CONSULT NOTE - Initial Consult  Pharmacy Consult for Lovenox Indication: VTE prophylaxis  No Known Allergies  Patient Measurements: Height: 5\' 3"  (160 cm) Weight: 203 lb 7.8 oz (92.3 kg) IBW/kg (Calculated) : 52.4  Vital Signs: Temp: 98.9 F (37.2 C) (01/19 2000) Temp src: Oral (01/19 2000) BP: 188/73 mmHg (01/19 2000) Pulse Rate: 32 (01/19 1800)  Labs:  Recent Labs  10/13/13 2000  HGB 11.1*  HCT 33.1*  PLT 216  LABPROT 12.7  INR 0.97  CREATININE 3.24*  TROPONINI <0.30    Estimated Creatinine Clearance: 17.4 ml/min (by C-G formula based on Cr of 3.24).   Medical History: Past Medical History  Diagnosis Date  . Hypertension   . Hypercholesterolemia   . Diabetes mellitus     with neuropathy  . Cancer 2008    right breast  . Cardiomyopathy     dating back to 2010  . CHF (congestive heart failure) pt stated she was recently diagnosed in another hospital with heart failure and a "weak heart"   Assessment: 73 year old female with CHB s/p temporary pacemaker.  She has renal insufficiency which requires adjustment of her Lovenox dose.   Plan:  Lovenox 30mg  SQ q24h Monitor renal function and adjust as needed  Legrand Como, Pharm.D., BCPS, AAHIVP Clinical Pharmacist Phone: [phone] or [phone] 10/13/2013, 9:36 PM

## 2013-10-13 NOTE — H&P (Signed)
Patient ID: Cassandra Parker MRN: [id number], DOB/AGE: [birth year]   Admit date: 10/13/2013   Primary Physician: Manon Hilding, MD Primary Cardiologist: Dr Haroldine Laws (new)  HPI: 73 y/o female with no prior history of CAD or MI. She has a history of breast cancer and was treated in the past by Dr Sonny Dandy with Rt mastectomy and chemotherapy. In a note from July 2011 he mentions an EF of 40-45%. She was admitted last week with dyspnea, initially felt to be pneumonia but subsequently told she had a "weak heart". She was scheduled to see a cardiologist as an OP. Today she had a "falling out spell"around 9:30 am at home. She did not injure herself. She called her primary MD who suggested she call 911. She eventually presented to [facility] and was noted to be in CHB with a ventricular escape in the 30s. She is transferred now for urgent pacemaker. She is awake and alert, she denies chest pain or any history of chest pain.   Problem List: Past Medical History  Diagnosis Date  . Hypertension   . Hypercholesterolemia   . Diabetes mellitus     with neuropathy  . Cancer 2008    right breast  . Cardiomyopathy     dating back to 2010    Past Surgical History  Procedure Laterality Date  . Mastectomy  2008     mmh-right  . Knee arthroscopy      right-mmh  . Cholecystectomy      mmh  . Appendectomy      mmh  . Cataract extraction w/phaco  06/29/2011    Procedure: CATARACT EXTRACTION PHACO AND INTRAOCULAR LENS PLACEMENT (IOC);  Surgeon: Tonny Branch;  Location: [facility];  Service: Ophthalmology;  Laterality: Right;  CDE: 14.41  . Cataract extraction w/phaco  09/04/2011    Procedure: CATARACT EXTRACTION PHACO AND INTRAOCULAR LENS PLACEMENT (IOC);  Surgeon: Tonny Branch;  Location: [facility];  Service: Ophthalmology;  Laterality: Left;  CDE: 15.21  . Cardiomyopathy       Allergies: No Known Allergies   Home Medications Current Facility-Administered Medications  Medication Dose Route Frequency  Provider Last Rate Last Dose  . 0.9 %  sodium chloride infusion  250 mL Intravenous PRN Jolaine Artist, MD      . acetaminophen (TYLENOL) tablet 650 mg  650 mg Oral Q4H PRN Jolaine Artist, MD      . Derrill Memo ON 10/14/2013] enoxaparin (LOVENOX) injection 40 mg  40 mg Subcutaneous Q24H Jolaine Artist, MD      . ondansetron [facility]) injection 4 mg  4 mg Intravenous Q6H PRN Shaune Pascal Sandara Tyree, MD      . sodium chloride 0.9 % injection 3 mL  3 mL Intravenous Q12H Shaune Pascal Ilir Mahrt, MD      . sodium chloride 0.9 % injection 3 mL  3 mL Intravenous PRN Jolaine Artist, MD         Family History  Problem Relation Age of Onset  . Anesthesia problems Neg Hx   . Hypotension Neg Hx   . Malignant hyperthermia Neg Hx   . Pseudochol deficiency Neg Hx      History   Social History  . Marital Status: Widowed    Spouse Name: N/A    Number of Children: N/A  . Years of Education: N/A   Occupational History  . Not on file.   Social History Main Topics  . Smoking status: Never Smoker   . Smokeless tobacco: Not on file  .  Alcohol Use: No  . Drug Use: No  . Sexual Activity: Yes    Birth Control/ Protection: Post-menopausal   Other Topics Concern  . Not on file   Social History Narrative  . No narrative on file     Review of Systems: See above. Complete ROS not obtainable, pt in cath lab  Physical Exam: Height 5' 4.96" (1.65 m), weight 90.7 kg (199 lb 15.3 oz).  HR 30 142/46   General appearance: alert, cooperative, mild distress and moderately obese Neck: no carotid bruit and no JVD Lungs: clear to auscultation bilaterally Heart: slow rate, no murmur Abdomen: soft, non tender Extremities: no edema Pulses: diminnished Skin: cool and dry Neurologic: Grossly normal    Labs: pending   Radiology/Studies: No results found.  EKG: Sinus rhythm with CHB, VR 33, diffuse TWI  ASSESSMENT AND PLAN:  Principal Problem:   CHB (complete heart block) Active Problems:    Syncope Syncope CHB Diabetes with neuropathy HTN Breast cancer Dyslipidemia Cardiomyopathy- etiology not yet determined.  PLAN: Urgent pacemaker  Signed, Kerin Ransom PA-C  Patient seen and examined independently. Kerin Ransom, PA-C note reviewed carefully - agree with his assessment and plan. I have edited the note based on my findings.   Ms. Burhans presents with syncopal episode in setting of CHB. Have place emergent TVP. Will need echo and EP to see. Hold all AVN blockers. If EF down will likely need cath. Watch in CCU.  Glori Bickers, MD 10/13/2013, 6:32 PM

## 2013-10-13 NOTE — H&P (Signed)
Patient ID: Cassandra Parker MRN: [id number], DOB/AGE: May 02, 1941    Admit date: 10/13/13     Primary Physician: Cassandra Hilding, MD Primary Cardiologist: Dr Cassandra Parker (new)   HPI: 73 y/o female with no prior history of CAD or MI. She has ah history of breast cancer and was treated in the past by Dr Sonny Dandy with Rt mastectomy and chemotherapy. In a note from July 2011 he mentions an EF of 40-45%. She was admitted last week with dyspnea, initially felt to be pneumonia but subsequently told she had a "weak heart". She was scheduled to see a cardiologist as an OP. Today she had a "falling out spell"around 9:30 am at home. She did not injure herself. She called her primary MD who suggested she call 911. She eventually presented to [facility] and was noted to be in CHB with a ventricular escape in the 30s. She is transferred now for urgent pacemaker. She is awake and alert, she denies chest pain or any history of chest pain.     Problem List: Past Medical History   Diagnosis  Date   .  Hypertension     .  Hypercholesterolemia     .  Diabetes mellitus         with neuropathy   .  Cancer  2008       right breast   .  Cardiomyopathy         dating back to 2010      Past Surgical History   Procedure  Laterality  Date   .  Mastectomy    2008        mmh-right   .  Knee arthroscopy           right-mmh   .  Cholecystectomy           mmh   .  Appendectomy           mmh   .  Cataract extraction w/phaco    06/29/2011       Procedure: CATARACT EXTRACTION PHACO AND INTRAOCULAR LENS PLACEMENT (IOC);  Surgeon: Tonny Branch;  Location: [facility];  Service: Ophthalmology;  Laterality: Right;  CDE: 14.41   .  Cataract extraction w/phaco    09/04/2011       Procedure: CATARACT EXTRACTION PHACO AND INTRAOCULAR LENS PLACEMENT (IOC);  Surgeon: Tonny Branch;  Location: [facility];  Service: Ophthalmology;  Laterality: Left;  CDE: 15.21   .  Cardiomyopathy          Allergies: No Known  Allergies     Home Medications No current facility-administered medications for this encounter.       Current Outpatient Prescriptions   Medication  Sig  Dispense  Refill   .  acetaminophen (TYLENOL) 650 MG CR tablet  Take 650 mg by mouth 2 (two) times daily as needed. For pain          .  aspirin EC 81 MG tablet  Take 81 mg by mouth daily.           .  carvedilol (COREG) 6.25 MG tablet  Take 6.25 mg by mouth 2 (two) times daily.           .  furosemide (LASIX) 40 MG tablet  Take 40 mg by mouth daily.           Marland Kitchen  lisinopril (PRINIVIL,ZESTRIL) 20 MG tablet  Take 20 mg by mouth daily.           Marland Kitchen  omega-3 acid ethyl esters (LOVAZA) 1 G capsule  Take 1 g by mouth 2 (two) times daily.          .  pravastatin (PRAVACHOL) 10 MG tablet  Take 10 mg by mouth daily.                 Family History   Problem  Relation  Age of Onset   .  Anesthesia problems  Neg Hx     .  Hypotension  Neg Hx     .  Malignant hyperthermia  Neg Hx     .  Pseudochol deficiency  Neg Hx           History       Social History   .  Marital Status:  Widowed       Spouse Name:  N/A       Number of Children:  N/A   .  Years of Education:  N/A       Occupational History   .  Not on file.       Social History Main Topics   .  Smoking status:  Never Smoker    .  Smokeless tobacco:  Not on file   .  Alcohol Use:  No   .  Drug Use:  No   .  Sexual Activity:  Yes       Birth Control/ Protection:  Post-menopausal       Other Topics  Concern   .  Not on file       Social History Narrative   .  No narrative on file        Review of Systems: See above. Complete ROS not obtainable, pt in cath lab   Physical Exam: There were no vitals taken for this visit.  General appearance: alert, cooperative, mild distress and moderately obese Neck: no carotid bruit and no JVD Lungs: clear to auscultation bilaterally Heart: slow rate, no murmur Abdomen: soft, non tender Extremities: no edema Pulses:  diminnished Skin: cool and dry Neurologic: Grossly normal       Labs:  No results found for this or any previous visit (from the past 24 hour(s)).   Radiology/Studies: No results found.   EKG:CHB, VR 33, diffuse TWI   ASSESSMENT AND PLAN:   Active Problems:   * No active hospital problems. * Syncope CHB Diabetes with neuropathy HTN Breast cancer Dyslipidemia Cardiomyopathy- etiology not yet determined.   PLAN: Urgent pacemaker     Signed, Erlene Quan, PA-C 10/13/2013, 6:13 PM

## 2013-10-14 ENCOUNTER — Ambulatory Visit (HOSPITAL_COMMUNITY): Admission: RE | Admit: 2013-10-14 | Payer: Medicare Other | Source: Ambulatory Visit | Admitting: Cardiovascular Disease

## 2013-10-14 DIAGNOSIS — I059 Rheumatic mitral valve disease, unspecified: Secondary | ICD-10-CM

## 2013-10-14 LAB — BASIC METABOLIC PANEL
BUN: 78 mg/dL — ABNORMAL HIGH (ref 6–23)
CO2: 21 mEq/L (ref 19–32)
Calcium: 9 mg/dL (ref 8.4–10.5)
Chloride: 108 mEq/L (ref 96–112)
Creatinine, Ser: 2.59 mg/dL — ABNORMAL HIGH (ref 0.50–1.10)
GFR calc Af Amer: 20 mL/min — ABNORMAL LOW (ref >90)
GFR calc non Af Amer: 18 mL/min — ABNORMAL LOW (ref >90)
Glucose, Bld: 107 mg/dL — ABNORMAL HIGH (ref 70–99)
Potassium: 4.1 mEq/L (ref 3.7–5.3)
Sodium: 143 mEq/L (ref 137–147)

## 2013-10-14 LAB — TROPONIN I
Troponin I: <0.3 ng/mL (ref <0.30)
Troponin I: <0.3 ng/mL (ref <0.30)

## 2013-10-14 LAB — TSH: TSH: 1.033 u[IU]/mL (ref 0.350–4.500)

## 2013-10-14 LAB — GLUCOSE, CAPILLARY
Glucose-Capillary: 116 mg/dL — ABNORMAL HIGH (ref 70–99)
Glucose-Capillary: 138 mg/dL — ABNORMAL HIGH (ref 70–99)
Glucose-Capillary: 127 mg/dL — ABNORMAL HIGH (ref 70–99)
Glucose-Capillary: 222 mg/dL — ABNORMAL HIGH (ref 70–99)

## 2013-10-14 LAB — HEMOGLOBIN A1C
Hgb A1c MFr Bld: 6.6 % — ABNORMAL HIGH (ref <5.7)
Mean Plasma Glucose: 143 mg/dL — ABNORMAL HIGH (ref <117)

## 2013-10-14 MED ORDER — HYDRALAZINE HCL 20 MG/ML IJ SOLN
10.0000 mg | Freq: Four times a day (QID) | INTRAMUSCULAR | Status: DC | PRN
  Administered 2013-10-15: 10 mg via INTRAVENOUS
  Filled 2013-10-14: qty 1

## 2013-10-14 MED ORDER — SODIUM CHLORIDE 0.9 % IR SOLN
80.0000 mg | Status: DC
  Filled 2013-10-14 (×2): qty 2

## 2013-10-14 MED ORDER — CHLORHEXIDINE GLUCONATE 4 % EX LIQD
60.0000 mL | Freq: Once | CUTANEOUS | Status: AC
  Administered 2013-10-14: 4 via TOPICAL
  Filled 2013-10-14: qty 60

## 2013-10-14 MED ORDER — CEFAZOLIN SODIUM-DEXTROSE 2-3 GM-% IV SOLR
2.0000 g | INTRAVENOUS | Status: DC
  Filled 2013-10-14 (×2): qty 50

## 2013-10-14 MED ORDER — CHLORHEXIDINE GLUCONATE 4 % EX LIQD
60.0000 mL | Freq: Once | CUTANEOUS | Status: AC
  Administered 2013-10-15: 4 via TOPICAL
  Filled 2013-10-14: qty 60

## 2013-10-14 MED ORDER — SODIUM CHLORIDE 0.9 % IV SOLN: INTRAVENOUS | Status: DC

## 2013-10-14 NOTE — Care Management Note (Signed)
    Page 1 of 1   10/14/2013     11:32:08 AM   CARE MANAGEMENT NOTE 10/14/2013  Patient:  LADASHIA, ELZY A   Account Number:  [id number]  Date Initiated:  10/14/2013  Documentation initiated by:  Elissa Hefty  Subjective/Objective Assessment:   adm w complete heart block     Action/Plan:   lives alone, pcp dr Eddie Dibbles sasser   Anticipated DC Date:     Anticipated DC Plan:        [place]  CM consult      Choice offered to / List presented to:             Status of service:   Medicare Important Message given?   (If response is "NO", the following Medicare IM given date fields will be blank) Date Medicare IM given:   Date Additional Medicare IM given:    Discharge Disposition:    Per UR Regulation:  Reviewed for med. necessity/level of care/duration of stay  If discussed at [place] of Stay Meetings, dates discussed:    Comments:

## 2013-10-14 NOTE — Progress Notes (Signed)
  Echocardiogram 2D Echocardiogram has been performed.  Cassandra Parker 10/14/2013, 11:50 AM

## 2013-10-14 NOTE — Consult Note (Signed)
ELECTROPHYSIOLOGY CONSULT NOTE    Patient ID: Cassandra Parker MRN: [id number], DOB/AGE: [birth year] 71 y.o.  Admit date: 10/13/2013 Date of Consult: 10/14/2013  Primary Physician: Manon Hilding, MD Primary Cardiologist: new to [facility]  Reason for Consultation: heart block and syncope  HPI:  [name] is a 73 year old female with a past medical history of breast cancer (s/p right mastectomy), hypertension, diabetes, and hyperlipidemia.  She has not previously had cardiology follow up. She states that at the time of her breast cancer in 2010 she was diagnosed with congestive heart failure and was told her EF was 35%.  She has been on Coreg since that time. She was recently hospitalized at [facility] for shortness of breath.  A repeat echo was done which demonstrated persistent LV dysfunction (per report) and she was advised to follow up with cardiology as an outpatient.  Yesterday, she was standing and had a syncopal spell.  She called 911 and was taken to [facility] for further evaluation. She was found to be in CHB with a ventricular escape in the 30's and transferred to [facility] for further evaluation.  She states that she has had "woozy" spells for several months that are predominately orthostatic in nature and resolve with sitting.  She has never had frank syncope before.  Prior to 2 weeks ago, she was not functionally limited in any way. She has never had an ischemic evaluation.   Prior to admission, she was on Coreg 6.25mg  twice daily. She has had left chest port a cath access during the time of her chemotherapy.   Labwork this admission is notable for creatinine of 3.24. Previously normal  EP has been asked to evaluate for treatment options.    ROS is negative except as outlined above.   Past Medical History  Diagnosis Date  . Hypertension   . Hypercholesterolemia   . Diabetes mellitus     with neuropathy  . Cancer 2008    right breast  . Cardiomyopathy     dating back  to 2010  . CHF (congestive heart failure) pt stated she was recently diagnosed in another hospital with heart failure and a "weak heart"     Surgical History:  Past Surgical History  Procedure Laterality Date  . Mastectomy  2008     mmh-right  . Knee arthroscopy      right-mmh  . Cholecystectomy      mmh  . Appendectomy      mmh  . Cataract extraction w/phaco  06/29/2011    Procedure: CATARACT EXTRACTION PHACO AND INTRAOCULAR LENS PLACEMENT (IOC);  Surgeon: Tonny Branch;  Location: [facility];  Service: Ophthalmology;  Laterality: Right;  CDE: 14.41  . Cataract extraction w/phaco  09/04/2011    Procedure: CATARACT EXTRACTION PHACO AND INTRAOCULAR LENS PLACEMENT (IOC);  Surgeon: Tonny Branch;  Location: [facility];  Service: Ophthalmology;  Laterality: Left;  CDE: 15.21  . Cardiomyopathy       Prescriptions prior to admission  Medication Sig Dispense Refill  . acetaminophen (TYLENOL) 500 MG tablet Take 1,000 mg by mouth at bedtime.      Marland Kitchen aspirin EC 81 MG tablet Take 81 mg by mouth daily.        . carvedilol (COREG) 6.25 MG tablet Take 6.25 mg by mouth 2 (two) times daily.        . Cholecalciferol (VITAMIN D) 2000 UNITS tablet Take 2,000 Units by mouth daily.      . furosemide (LASIX) 40 MG tablet  Take 40 mg by mouth daily.        Marland Kitchen glipiZIDE (GLUCOTROL XL) 5 MG 24 hr tablet Take 5 mg by mouth daily with breakfast.      . hydrALAZINE (APRESOLINE) 10 MG tablet Take 10 mg by mouth 3 (three) times daily.      . insulin regular (NOVOLIN R,HUMULIN R) 100 units/mL injection Inject 2-10 Units into the skin See admin instructions. Sliding scale.  check blood sugar levels in morning and at bedtime      . isosorbide mononitrate (IMDUR) 30 MG 24 hr tablet Take 30 mg by mouth daily.      Marland Kitchen lisinopril (PRINIVIL,ZESTRIL) 20 MG tablet Take 20 mg by mouth daily.        Marland Kitchen omega-3 acid ethyl esters (LOVAZA) 1 G capsule Take 1 g by mouth daily.       . pravastatin (PRAVACHOL) 10 MG tablet Take 10 mg by mouth at  bedtime.       . docusate sodium (COLACE) 100 MG capsule Take 100 mg by mouth 2 (two) times daily.        Inpatient Medications:  . aspirin  81 mg Oral Daily  . enoxaparin (LOVENOX) injection  30 mg Subcutaneous QHS  . insulin aspart  0-15 Units Subcutaneous TID WC  . insulin aspart  0-5 Units Subcutaneous QHS  . lisinopril  20 mg Oral Daily  . omega-3 acid ethyl esters  1 g Oral BID  . simvastatin  10 mg Oral q1800  . sodium chloride  3 mL Intravenous Q12H    Allergies: No Known Allergies  History   Social History  . Marital Status: Widowed    Spouse Name: N/A    Number of Children: N/A  . Years of Education: N/A   Occupational History  . Not on file.   Social History Main Topics  . Smoking status: Never Smoker   . Smokeless tobacco: Not on file  . Alcohol Use: No  . Drug Use: No  . Sexual Activity: Yes    Birth Control/ Protection: Post-menopausal   Other Topics Concern  . Not on file   Social History Narrative  . No narrative on file     Family History  Problem Relation Age of Onset  . Anesthesia problems Neg Hx   . Hypotension Neg Hx   . Malignant hyperthermia Neg Hx   . Pseudochol deficiency Neg Hx     Physical Exam Well appearing 73 yo woman NAD HEENT: Unremarkable Neck:  No JVD, no thyromegally, temporary PM lead via the right IJ Back:  No CVA tenderness Lungs:  Clear with no wheezes, rales or rhonchi HEART:  Regular rate rhythm, no murmurs, no rubs, no clicks Abd:  soft, positive bowel sounds, no organomegally, no rebound, no guarding Ext:  2 plus pulses, no edema, no cyanosis, no clubbing Skin:  No rashes no nodules Neuro:  CN II through XII intact, motor grossly intact   Labs:   Lab Results  Component Value Date   WBC 10.7* 10/13/2013   HGB 11.1* 10/13/2013   HCT 33.1* 10/13/2013   MCV 90.9 10/13/2013   PLT 216 10/13/2013    Recent Labs Lab 10/13/13 2000  CREATININE 3.24*   Lab Results  Component Value Date   TROPONINI <0.30  10/14/2013     Radiology/Studies: Dg Chest Port 1 View 10/13/2013   CLINICAL DATA:  Temporary pacer placement.  EXAM: PORTABLE CHEST - 1 VIEW  COMPARISON:  09/30/2013  FINDINGS: Right  venous scratch head right transvenous pacer has been placed from the right internal jugular vein. The tip is likely within the right ventricle. Mild cardiomegaly. Lungs are clear. No effusions or edema. No acute bony abnormality. No pneumothorax.  IMPRESSION: Placement of transvenous pacer with the tip likely in the right ventricle. No pneumothorax.   Electronically Signed   By: Rolm Baptise M.D.   On: 10/13/2013 19:47    EKG: 10-13-2013 complete heart block, ventricular rate 36  10-14-2013 sinus rhythm, rate 79, LBBB, QRS 168  TELEMETRY: sinus rhythm with no further complete heart block  A/P 1. Syncope, likely due to CHB 2. CHB, now resolved 3. LBBB at baseline with QRS duration 170 ms 4. Probable non-ischemic CM, ?due to prior chemo with progression Rec: will plan on 2D echo to re-assess EF, now that she is conducting. At this point with renal insufficiency, I would not recommend heart cath. Hopefully her kidney function will improve now that CHB has resolved. I would anticipate BiV PPM insertion. Will hold on ACE inhibitor in setting of renal insufficiency. No beta blocker with CHB.   Mikle Bosworth.D.

## 2013-10-15 ENCOUNTER — Encounter (HOSPITAL_COMMUNITY)
Admission: AD | Disposition: A | Discharge status: Home / Self Care | Payer: Self-pay | Source: Other Acute Inpatient Hospital | Attending: Internal Medicine

## 2013-10-15 DIAGNOSIS — I442 Atrioventricular block, complete: Principal | ICD-10-CM

## 2013-10-15 DIAGNOSIS — I050 Rheumatic mitral stenosis: Secondary | ICD-10-CM

## 2013-10-15 DIAGNOSIS — I5022 Chronic systolic (congestive) heart failure: Secondary | ICD-10-CM

## 2013-10-15 DIAGNOSIS — N289 Disorder of kidney and ureter, unspecified: Secondary | ICD-10-CM | POA: Diagnosis present

## 2013-10-15 HISTORY — DX: Rheumatic mitral stenosis: I05.0

## 2013-10-15 HISTORY — DX: Disorder of kidney and ureter, unspecified: N28.9

## 2013-10-15 HISTORY — PX: BI-VENTRICULAR PACEMAKER INSERTION: SHX5462

## 2013-10-15 LAB — BASIC METABOLIC PANEL
BUN: 58 mg/dL — AB (ref 6–23)
CHLORIDE: 108 meq/L (ref 96–112)
CO2: 19 mEq/L (ref 19–32)
Calcium: 8.6 mg/dL (ref 8.4–10.5)
Creatinine, Ser: 2.05 mg/dL — ABNORMAL HIGH (ref 0.50–1.10)
GFR calc Af Amer: 27 mL/min — ABNORMAL LOW (ref >90)
GFR calc non Af Amer: 23 mL/min — ABNORMAL LOW (ref >90)
GLUCOSE: 107 mg/dL — AB (ref 70–99)
Potassium: 4.5 mEq/L (ref 3.7–5.3)
Sodium: 141 mEq/L (ref 137–147)

## 2013-10-15 LAB — GLUCOSE, CAPILLARY
GLUCOSE-CAPILLARY: 97 mg/dL (ref 70–99)
Glucose-Capillary: 114 mg/dL — ABNORMAL HIGH (ref 70–99)
Glucose-Capillary: 107 mg/dL — ABNORMAL HIGH (ref 70–99)
Glucose-Capillary: 191 mg/dL — ABNORMAL HIGH (ref 70–99)

## 2013-10-15 SURGERY — BI-VENTRICULAR PACEMAKER INSERTION (CRT-P)
Anesthesia: LOCAL

## 2013-10-15 MED ORDER — FUROSEMIDE 40 MG PO TABS
40.0000 mg | ORAL_TABLET | Freq: Every day | ORAL | Status: DC
  Administered 2013-10-15: 40 mg via ORAL
  Filled 2013-10-15 (×2): qty 1

## 2013-10-15 MED ORDER — LIDOCAINE HCL (PF) 1 % IJ SOLN
INTRAMUSCULAR | Status: AC
  Filled 2013-10-15: qty 30

## 2013-10-15 MED ORDER — HEPARIN (PORCINE) IN NACL 2-0.9 UNIT/ML-% IJ SOLN
INTRAMUSCULAR | Status: AC
Start: 2013-10-15 — End: 2013-10-15
  Filled 2013-10-15: qty 500

## 2013-10-15 MED ORDER — ASPIRIN EC 81 MG PO TBEC: 81.0000 mg | DELAYED_RELEASE_TABLET | Freq: Every day | ORAL | Status: DC

## 2013-10-15 MED ORDER — MIDAZOLAM HCL 5 MG/5ML IJ SOLN
INTRAMUSCULAR | Status: AC
Start: 2013-10-15 — End: 2013-10-15
  Filled 2013-10-15: qty 5

## 2013-10-15 MED ORDER — ISOSORBIDE MONONITRATE ER 30 MG PO TB24
30.0000 mg | ORAL_TABLET | Freq: Every day | ORAL | Status: DC
  Administered 2013-10-15 – 2013-10-16 (×2): 30 mg via ORAL
  Filled 2013-10-15 (×2): qty 1

## 2013-10-15 MED ORDER — HYDRALAZINE HCL 10 MG PO TABS
10.0000 mg | ORAL_TABLET | Freq: Three times a day (TID) | ORAL | Status: DC
  Administered 2013-10-15 – 2013-10-16 (×3): 10 mg via ORAL
  Filled 2013-10-15 (×5): qty 1

## 2013-10-15 MED ORDER — CARVEDILOL 6.25 MG PO TABS
6.2500 mg | ORAL_TABLET | Freq: Two times a day (BID) | ORAL | Status: DC
Start: 2013-10-15 — End: 2013-10-16
  Administered 2013-10-15 – 2013-10-16 (×2): 6.25 mg via ORAL
  Filled 2013-10-15 (×4): qty 1

## 2013-10-15 MED ORDER — LISINOPRIL 20 MG PO TABS
20.0000 mg | ORAL_TABLET | Freq: Every day | ORAL | Status: DC
  Administered 2013-10-15: 20 mg via ORAL
  Filled 2013-10-15 (×2): qty 1

## 2013-10-15 MED ORDER — CEFAZOLIN SODIUM-DEXTROSE 2-3 GM-% IV SOLR
2.0000 g | Freq: Four times a day (QID) | INTRAVENOUS | Status: AC
  Administered 2013-10-15 – 2013-10-16 (×2): 2 g via INTRAVENOUS
  Filled 2013-10-15 (×3): qty 50

## 2013-10-15 MED ORDER — OMEGA-3-ACID ETHYL ESTERS 1 G PO CAPS: 1.0000 g | ORAL_CAPSULE | Freq: Every day | ORAL | Status: DC

## 2013-10-15 MED ORDER — FENTANYL CITRATE 0.05 MG/ML IJ SOLN
INTRAMUSCULAR | Status: AC
  Filled 2013-10-15: qty 2

## 2013-10-15 MED ORDER — ACETAMINOPHEN 500 MG PO TABS
1000.0000 mg | ORAL_TABLET | Freq: Every day | ORAL | Status: DC
  Administered 2013-10-15: 1000 mg via ORAL
  Filled 2013-10-15 (×3): qty 2

## 2013-10-15 NOTE — CV Procedure (Signed)
BiV PM insertion via the left subclavian vein without immediate complication. [id number].

## 2013-10-15 NOTE — Progress Notes (Signed)
Patient ID: Cassandra Parker, female   DOB: [birth year], 73 y.o.   MRN: [id number] Subjective:  No chest pain or sob. Dyspnea improved. Objective:  Vital Signs in the last 24 hours: Temp:  [98.6 F (37 C)-98.7 F (37.1 C)] 98.7 F (37.1 C) (01/21 0400) Resp:  [17-31] 26 (01/21 0700) BP: (133-185)/(49-139) 177/66 mmHg (01/21 0700) SpO2:  [95 %-96 %] 96 % (01/21 0400)  Intake/Output from previous day: 01/20 0701 - 01/21 0700 In: 1140 [P.O.:520; I.V.:620] Out: 1050 [Urine:1050] Intake/Output from this shift:    Physical Exam: Well appearing 73 yo woman, NAD HEENT: Unremarkable Neck:  No JVD, no thyromegally Back:  No CVA tenderness Lungs:  Clear with no wheezes HEART:  Regular rate rhythm, no murmurs, no rubs, no clicks Abd:  Soft, obese, positive bowel sounds, no organomegally, no rebound, no guarding Ext:  2 plus pulses, no edema, no cyanosis, no clubbing Skin:  No rashes no nodules Neuro:  CN II through XII intact, motor grossly intact  Lab Results:  Recent Labs  10/13/13 2000  WBC 10.7*  HGB 11.1*  PLT 216    Recent Labs  10/14/13 0740 10/15/13 0312  NA 143 141  K 4.1 4.5  CL 108 108  CO2 21 19  GLUCOSE 107* 107*  BUN 78* 58*  CREATININE 2.59* 2.05*    Recent Labs  10/14/13 0112 10/14/13 0740  TROPONINI <0.30 <0.30   Hepatic Function Panel No results found for this basename: PROT, ALBUMIN, AST, ALT, ALKPHOS, BILITOT, BILIDIR, IBILI,  in the last 72 hours No results found for this basename: CHOL,  in the last 72 hours No results found for this basename: PROTIME,  in the last 72 hours  Imaging: Dg Chest Port 1 View  10/13/2013   CLINICAL DATA:  Temporary pacer placement.  EXAM: PORTABLE CHEST - 1 VIEW  COMPARISON:  09/30/2013  FINDINGS: Right venous scratch head right transvenous pacer has been placed from the right internal jugular vein. The tip is likely within the right ventricle. Mild cardiomegaly. Lungs are clear. No effusions or edema. No acute  bony abnormality. No pneumothorax.  IMPRESSION: Placement of transvenous pacer with the tip likely in the right ventricle. No pneumothorax.   Electronically Signed   By: Rolm Baptise M.D.   On: 10/13/2013 19:47    Cardiac Studies: Tele - nsr with LBBB Assessment/Plan:  1. Syncope 2. CHB 3. LBBB 4. Likely non-ischemic CM as a consequence of chemotherapy (patient had EF 40-45% 2 years ago and IVCD) 5. Acute renal insufficiency, likely due to ATN from low cardiac output in the setting of CHB Rec: will plan to proceed with BiV PPM insertion. I have discussed the risks/benefits/goals/expectations with the patient and she wishes to proceed.   Gregg Taylor,M.D.  LOS: 2 days    Cristopher Peru 10/15/2013, 8:35 AM

## 2013-10-15 NOTE — Progress Notes (Signed)
Pt felt dizzy and light headed few minutes after receiving scheduled  medications: Lisinopril, Hydralazine, Coreg and lasix. Her BP came down from 163/60 to 112/ 48. She said she felt the same way at home before she" passed out". Her HR is in the 80's. PA on call notified. Will continue to monitor.

## 2013-10-15 NOTE — Discharge Summary (Signed)
ELECTROPHYSIOLOGY PROCEDURE DISCHARGE SUMMARY    Patient ID: Cassandra Parker,  MRN: [id number], DOB/AGE: [birth year] 73 y.o.  Admit date: 10/13/2013 Discharge date: 10/16/2013  Primary Care Physician: Consuello Masse, MD Electrophysiologist: Cristopher Peru, MD  Primary Discharge Diagnosis:  Non ischemic cardiomyopathy, complete heart block, and left bundle branch block status post CRTP implantation   Secondary Discharge Diagnosis:  1.  Hypertension 2.  Hyperlipidemia 3.  Type II diabetes mellitus 4.  Prior right breast cancer (2010) s/p mastectomy and chemotherapy  Procedures This Admission:  1.  Insertion of temporary transvenous pacemaker on 10-13-2013 by Dr Haroldine Laws 2.  Echocardiogram 10/14/2013  Study Conclusions - Left ventricle: There is pronounced paradoxical septal motion contributing to the low ejection fraction, remaining walls are mildly hypokinetic in its mid and apical segments. Overall LV EF is estimated at 35-40%. The cavity size was normal. There was moderate concentric hypertrophy. Systolic function was moderately reduced. The estimated ejection fraction was in the range of 35% to 40%. Doppler parameters are consistent with abnormal left ventricular relaxation (grade 1 diastolic dysfunction). Tissue Doppler was not performed, so we are unable to calculate the filling pressures. - Ventricular septum: Septal motion showed paradox. - Aortic valve: Trileaflet; mildly thickened leaflets. Transvalvular velocity was within the normal range. There was no stenosis. No regurgitation. - Aortic root: The aortic root was normal in size. - Mitral valve: Severe mitral annular calcifications, predominantly posterior involving the posterior leaflet that has limited motion.There is moderate mitral stenosis with peak gradient 20 mmHg and mean gradient 7 mmHg. Trivial regurgitation. Mean gradient: 58mm Hg (D). Peak gradient: 50mm Hg (D). Valve area by continuity  equation (using LVOT flow): 1.8cm^2. - Left atrium: The atrium was mildly dilated. - Right ventricle: The cavity size was normal. Wall thickness was normal. Pacer wire or catheter noted in right ventricle. Systolic function was normal. - Right atrium: The atrium was normal in size. Pacer wire or catheter noted in right atrium. - Tricuspid valve: Trivial regurgitation. - Pulmonic valve: No regurgitation. Peak gradient: 32mm Hg (S). - Pulmonary arteries: Systolic pressure was within the normal range. - Pericardium, extracardiac: A trivial pericardial effusion was identified. 3.  Implantation of a CRT-P by Dr Lovena Le on 10-15-2013 RA lead - St. Jude model [id number] 46-cm active fixation pacing lead, serial [id number]  RV lead - St. Jude model 1688T 52-cm active fixation pacing lead, serial [id number] LV lead - St. Jude model [id number] 86 cm quadripolar pacing lead serial [id number]  Device - [address] Jude model [id number] serial [id number]  Brief HPI: Cassandra Parker is a 73 year old female with a past medical history as outlined above.  She has never before had a cardiac workup.  She has had known cardiomyopathy since the time of her breast cancer in 2010. She has had increasing shortness of breath and was admitted to [facility] 08/2013.  At that time she was referred to outpatient cardiology for further evaluation. The day of admission, she was standing in her kitchen and had a syncopal spell.  She called 911 and was taken to [facility].  There she was found to be in CHB and was transferred to [facility] for further evaluation.   Hospital Course:  She was admitted and a temporary transvenous pacemaker was placed. She had return of 1:1 conduction overnight. Her creatinine was acutely increased and her Lisinopril was held. On 10/15/2013 her renal function had improved. Echo demonstrated EF 35-40%. Risks, benefits, and alternatives to CRTP were discussed with  the patient by Dr Lovena Le who wished to proceed. The  patient underwent implantation of a CRTP with details as outlined above. She was monitored on telemetry overnight which demonstrated A sensed V paced rhythm. Left chest implant site was without hematoma or ecchymosis. The device was interrogated and found to be functioning normally. CXR was obtained and demonstrated no pneumothorax status post device implantation. Wound care, arm mobility, and restrictions were reviewed with the patient. Dr Cristopher Peru examined the patient and considered them stable for discharge to home today. Of note, she developed dizziness with hypotension while here and her Coreg and lisinopril doses were down-titrated. She will have follow-up in 7-10 days for wound check. She will have outpatient cardiology follow up to determine need for outpatient ischemic evaluation. An appointment has been made with Dr. Bronson Ing on 11/04/2013.  Discharge Vitals: Blood pressure 158/74, pulse 97, temperature 98.4 F (36.9 C), temperature source Oral, resp. rate 18, height 5\' 3"  (1.6 m), weight 200 lb 6.4 oz (90.9 kg), SpO2 99.00%.    Labs: Lab Results  Component Value Date   WBC 10.7* 10/13/2013   HGB 11.1* 10/13/2013   HCT 33.1* 10/13/2013   MCV 90.9 10/13/2013   PLT 216 10/13/2013     Recent Labs Lab 10/16/13 0740  NA 141  K 4.5  CL 108  CO2 20  BUN 40*  CREATININE 1.87*  CALCIUM 8.6  GLUCOSE 132*    Discharge Medications:    Medication List         acetaminophen 500 MG tablet  Commonly known as:  TYLENOL  Take 1,000 mg by mouth at bedtime.     aspirin EC 81 MG tablet  Take 81 mg by mouth daily.     carvedilol 6.25 MG tablet  Commonly known as:  COREG  Take 0.5 tablets (3.125 mg total) by mouth 2 (two) times daily.     docusate sodium 100 MG capsule  Commonly known as:  COLACE  Take 100 mg by mouth 2 (two) times daily.     furosemide 40 MG tablet  Commonly known as:  LASIX  Take 40 mg by mouth daily.     glipiZIDE 5 MG 24 hr tablet  Commonly known as:   GLUCOTROL XL  Take 5 mg by mouth daily with breakfast.     hydrALAZINE 10 MG tablet  Commonly known as:  APRESOLINE  Take 10 mg by mouth 3 (three) times daily.     insulin regular 100 units/mL injection  Commonly known as:  NOVOLIN R,HUMULIN R  Inject 2-10 Units into the skin See admin instructions. Sliding scale.  check blood sugar levels in morning and at bedtime     isosorbide mononitrate 30 MG 24 hr tablet  Commonly known as:  IMDUR  Take 30 mg by mouth daily.     lisinopril 5 MG tablet  Commonly known as:  PRINIVIL,ZESTRIL  Take 1 tablet (5 mg total) by mouth daily.     omega-3 acid ethyl esters 1 G capsule  Commonly known as:  LOVAZA  Take 1 g by mouth daily.     pravastatin 10 MG tablet  Commonly known as:  PRAVACHOL  Take 10 mg by mouth at bedtime.     Vitamin D 2000 UNITS tablet  Take 2,000 Units by mouth daily.       Disposition:  Discharge Orders   Future Appointments Provider Department Dept Phone   10/27/2013 2:30 PM [facility] [place] [facility] [phone]  11/04/2013 1:40 PM Herminio Commons, MD [place] [phone]   Future Orders Complete By Expires   Diet - low sodium heart healthy  As directed    Increase activity slowly  As directed      Follow-up Information   Follow up with [facility] On 10/27/2013. (At 2:30 PM for wound check)    Specialty:  Cardiology   Contact information:   [address] [place] [address] [phone]      Follow up with Herminio Commons, MD On 11/04/2013. (At 1:40 PM to establish cardiology care)    Specialty:  Cardiology   Contact information:   [address] [place] [address] [phone]       Follow up with [place] In 3 months. (To see Dr. Lovena Le for device follow-up; Our office will call you with your appointment date and time)    Specialty:  Cardiology   Contact information:   [place] [address] [phone]     Duration of Discharge Encounter: Greater than 30 minutes including physician time.  Manson Passey 10/16/2013 10:17 AM  EP Attending  Patient seen and examined. Agree with above.  Mikle Bosworth.D.

## 2013-10-16 ENCOUNTER — Inpatient Hospital Stay (HOSPITAL_COMMUNITY): Payer: Medicare HMO

## 2013-10-16 LAB — BASIC METABOLIC PANEL
BUN: 40 mg/dL — AB (ref 6–23)
CHLORIDE: 108 meq/L (ref 96–112)
CO2: 20 mEq/L (ref 19–32)
Calcium: 8.6 mg/dL (ref 8.4–10.5)
Creatinine, Ser: 1.87 mg/dL — ABNORMAL HIGH (ref 0.50–1.10)
GFR calc Af Amer: 30 mL/min — ABNORMAL LOW (ref >90)
GFR calc non Af Amer: 26 mL/min — ABNORMAL LOW (ref >90)
Glucose, Bld: 132 mg/dL — ABNORMAL HIGH (ref 70–99)
Potassium: 4.5 mEq/L (ref 3.7–5.3)
Sodium: 141 mEq/L (ref 137–147)

## 2013-10-16 LAB — GLUCOSE, CAPILLARY: Glucose-Capillary: 112 mg/dL — ABNORMAL HIGH (ref 70–99)

## 2013-10-16 MED ORDER — CARVEDILOL 6.25 MG PO TABS: 3.1250 mg | ORAL_TABLET | Freq: Two times a day (BID) | ORAL | Status: DC

## 2013-10-16 MED ORDER — LISINOPRIL 5 MG PO TABS: 5.0000 mg | ORAL_TABLET | Freq: Every day | ORAL | Status: DC

## 2013-10-16 NOTE — Progress Notes (Signed)
ANTICOAGULATION CONSULT NOTE - Follow Up Consult  Pharmacy Consult for Lovenox Indication: VTE prophylaxis  No Known Allergies  Patient Measurements: Height: 5\' 3"  (160 cm) Weight: 200 lb 6.4 oz (90.9 kg) IBW/kg (Calculated) : 52.4 Heparin Dosing Weight:   Vital Signs: Temp: 98.4 F (36.9 C) (01/22 0800) Temp src: Oral (01/22 0800) BP: 158/74 mmHg (01/22 0800) Pulse Rate: 97 (01/22 0800)  Labs:  Recent Labs  10/13/13 2000 10/14/13 0112 10/14/13 0740 10/15/13 0312 10/16/13 0740  HGB 11.1*  --   --   --   --   HCT 33.1*  --   --   --   --   PLT 216  --   --   --   --   LABPROT 12.7  --   --   --   --   INR 0.97  --   --   --   --   CREATININE 3.24*  --  2.59* 2.05* 1.87*  TROPONINI <0.30 <0.30 <0.30  --   --     Estimated Creatinine Clearance: 30 ml/min (by C-G formula based on Cr of 1.87).   Assessment: Syncope CHB, s/p temp pacer  PMH: breast cancer (s/p right mastectomy), hypertension, diabetes, and hyperlipidemia, HF (EF= 35%)  Anticoag: Lovenox for DVT px. Lovenox 30mg /d with CrCl 30. No bleeding noted.  Cards: here w/ syncope- CHB in setting of NICM, ?due to prior chemo with progression. CHB s/p temp pacer. Echo (EF= 35-40%) and BiV PPM insertion. Meds: ASA81, Coreg, Lasix, hydralazine, Imdur, lisinopril, Lovaza, Zocor  Endo: CBGs 97-112, one value 197 on SSI  Renal: Acute renal insufficiency, likely due to ATN from low cardiac output in the setting of CHB. SCr 1.87 down. CrCl 30  Heme/Onc: Hg= 11.1, plt= 216 on 1/20  Best Practices: enox 30mg   PTA Meds: glipizide  Goal of Therapy:  DVT prophylaxis Monitor platelets by anticoagulation protocol: Yes   Plan:  Lovenox 30mg /24h.   Hatsuko Bizzarro S. Alford Highland, PharmD, BCPS Clinical Staff Pharmacist Pager [phone]  Eilene Ghazi Stillinger 10/16/2013,8:52 AM

## 2013-10-16 NOTE — Op Note (Signed)
NAMEECKO, BANGS NO.:  [id number]  MEDICAL RECORD NO.:  [id number]  LOCATION:  [id number]                        FACILITY:  [facility]  PHYSICIAN:  Champ Mungo. Lovena Le, MD    DATE OF BIRTH:  [birth year]  DATE OF PROCEDURE:  10/15/2013 DATE OF DISCHARGE:                              OPERATIVE REPORT   PROCEDURE PERFORMED:  Insertion of a biventricular pacemaker.  INDICATION:  Syncope in the setting of complete heart block, in the setting of longstanding nonischemic cardiomyopathy, and chronic class III systolic heart failure.  INTRODUCTION:  The patient is a 73 year old woman who presented to the hospital with syncope.  She was found to be in complete heart block. She gave a history of 1 month of increasing shortness of breath, fatigue, and weakness.  She was found to have renal insufficiency which appeared to be secondary to a low output state.  When her conduction system improved, her renal function improved.  Her ejection fraction was 35% to 40% by echo.  After development of complete heart block, the patient developed first-degree AV block with left bundle-branch block and is now referred for insertion of a biventricular pacemaker secondary to Stokes-Adams syncope, left bundle-branch block, and longstanding nonischemic cardiomyopathy.  Of note, the patient's ejection fraction was 40% to 45% two years prior and her QRS duration was intraventricular conduction delay at 150 milliseconds.  DESCRIPTION OF PROCEDURE:  After informed was obtained, the patient was taken to the diagnostic EP lab in a fasting state.  After usual preparation and draping, intravenous fentanyl and midazolam were given for sedation.  A 30 mL of lidocaine was infiltrated into the left infraclavicular region.  A 5-cm incision was carried out over this region and the electrocautery was utilized to dissect down to the fascial plane.  Multiple attempts to puncture the left subclavian vein were  unsuccessful.  A 10 mL of contrast was injected into the left upper extremity venous system, demonstrating that the vein was in fact patent, but displaced superiorly.  It was then successfully punctured and the St. Jude model 1688T 52-cm active fixation pacing lead, serial [id number] was advanced into the right ventricle and the St. Jude model 1688T 46-cm active fixation pacing lead, serial [id number] was advanced into the right atrium.  Mapping was first carried out in the right ventricle at the final site on the RV apical septum, the R-waves were 9 mV with the lead actively fixed, the pacing impedance 650 ohms. Threshold was a V at 0.4 milliseconds.  A 10 V pacing did not stimulate the diaphragm and there was a large injury current with active fixation of the lead.  With the right ventricular lead in satisfactory position, attention was then turned to placement of the atrial lead which was placed in the anterolateral portion of the right atrium.  P-waves measured 5 mV and the pacing impedance with the lead actively fixed was 422 ohms.  The threshold was 0.5 V at 0.5 milliseconds.  A 10 V pacing again did not stimulate the diaphragm and again there was a large injury current with active fixation of the lead.  With both the atrial and the RV leads in satisfactory position, attention was then  made towards its insertion of the left ventricular lead.  The coronary sinus guiding catheter along with a 6-French hexapolar EP catheter were inserted over a guidewire into the right atrium.  The coronary sinus was cannulated without particular difficulty.  Venography of the coronary sinus was carried out with a total of 5 mL of contrast.  There was a very nice posterolateral vein which was selected for LV lead placement.  It was very tortuous with a Shepherd crook at the proximal portion of the vein. The vein was traversed with an 0.14 floppy guidewire and the St. Jude model 1458Q 86 cm quadripolar  pacing lead serial #[id number] was advanced over the angioplasty guidewire and into the posterolateral vein of the left ventricle.  In this location, the guiding catheter was liberated and the leads were secured to the subpectoral fascia with a silk suture. The sewing sleeves were also secured with silk suture.  Electrocautery was utilized to make a subcutaneous pocket.  Antibiotic irrigation was utilized to irrigate the pocket and electrocautery was utilized to assure hemostasis.  The St. Jude biventricular pacemaker serial [id number] was connected to the atrial, RV, and LV leads and placed back in the subcutaneous pocket.  The pocket was irrigated with antibiotic irrigation and the incision was closed with 2-0 and 3-0 Vicryl.  Benzoin and Steri-Strips were painted on the skin and a pressure dressing was applied, and the patient was returned to her room in satisfactory condition.  COMPLICATIONS:  There were no immediate procedure complications.  RESULTS:  This demonstrate successful implantation of a St. Jude biventricular pacemaker in a patient with syncope and complete heart block, chronic left bundle-branch block, and a nonischemic cardiomyopathy.     Champ Mungo. Lovena Le, MD     GWT/MEDQ  D:  10/15/2013  T:  10/16/2013  Job:  [id number]  cc:   Consuello Masse, MD

## 2013-10-16 NOTE — Progress Notes (Signed)
    Patient: Cassandra Parker Date of Encounter: 10/16/2013, 7:01 AM Admit date: 10/13/2013     Subjective  Cassandra Parker is doing well this AM. She denies CP or SOB. Her IV site on left hand infiltrated last night so left hand is swollen some. No other events noted. She is eager to go home.   Objective  Physical Exam: Vitals: BP 120/45  Pulse 88  Temp(Src) 98.5 F (36.9 C) (Oral)  Resp 18  Ht 5\' 3"  (1.6 m)  Wt 200 lb 6.4 oz (90.9 kg)  BMI 35.51 kg/m2  SpO2 96% General: Well developed, well appearing 73 year old female in no acute distress. Neck: Supple. JVD not elevated. Lungs: Clear bilaterally to auscultation without wheezes, rales, or rhonchi. Breathing is unlabored. Heart: RRR S1 S2 without murmurs, rubs, or gallops.  Abdomen: Soft, non-distended. Extremities: No clubbing or cyanosis. No edema.  Distal pedal pulses are 2+ and equal bilaterally. Neuro: Alert and oriented X 3. Moves all extremities spontaneously. No focal deficits. Skin: Left upper chest / implant site intact without bleeding or hematoma.  Intake/Output:  Intake/Output Summary (Last 24 hours) at 10/16/13 0701 Last data filed at 10/15/13 1100  Gross per 24 hour  Intake    200 ml  Output      0 ml  Net    200 ml    Inpatient Medications:  . acetaminophen  1,000 mg Oral QHS  . aspirin  81 mg Oral Daily  . carvedilol  6.25 mg Oral BID WC  . enoxaparin (LOVENOX) injection  30 mg Subcutaneous QHS  . furosemide  40 mg Oral Daily  . hydrALAZINE  10 mg Oral TID  . insulin aspart  0-15 Units Subcutaneous TID WC  . insulin aspart  0-5 Units Subcutaneous QHS  . isosorbide mononitrate  30 mg Oral Daily  . lisinopril  20 mg Oral Daily  . omega-3 acid ethyl esters  1 g Oral BID  . simvastatin  10 mg Oral q1800  . sodium chloride  3 mL Intravenous Q12H    Labs:  Recent Labs  10/14/13 0740 10/15/13 0312  NA 143 141  K 4.1 4.5  CL 108 108  CO2 21 19  GLUCOSE 107* 107*  BUN 78* 58*  CREATININE 2.59*  2.05*  CALCIUM 9.0 8.6    Recent Labs  10/13/13 2000  WBC 10.7*  HGB 11.1*  HCT 33.1*  MCV 90.9  PLT 216    Recent Labs  10/13/13 2000 10/14/13 0112 10/14/13 0740  TROPONINI <0.30 <0.30 <0.30    Recent Labs  10/13/13 2000  HGBA1C 6.6*    Recent Labs  10/13/13 2000  TSH 1.033    Recent Labs  10/13/13 2000  INR 0.97    Radiology/Studies:  Chest x-ray - no PTX Device interrogation: normal device function. Telemetry: A sensed V paced; no arrhythmias   Assessment and Plan   1. CHB in setting of NICM, EF 35-40%, with chronic systolic HF and LBBB, now s/p CRT-P implant  Cassandra Parker is doing well post implant. Wound is intact without bleeding or hematoma. Chest x-ray and device interrogation are ok. Reviewed wound care, activity restrictions and follow-up with her. Ambulate this AM. Plan for home this AM if chest x-ray and device interrogation are normal.  Signed, EDMISTEN, BROOKE PA-C  EP Attending  Patient seen and examined. Agree with above. Her BiV PPM is working normally. Will recheck in several months.  Mikle Bosworth.D.

## 2013-10-16 NOTE — Discharge Instructions (Signed)
° °  Supplemental Discharge Instructions for  Pacemaker/Defibrillator Patients  Activity No heavy lifting or vigorous activity with your left/right arm for 6 to 8 weeks.  Do not raise your left/right arm above your head for one week.  Gradually raise your affected arm as drawn below.           01/24                      01/25                       01/26                      01/27       NO DRIVING until given clearance by Dr. Lovena Le. WOUND CARE   Keep the wound area clean and dry.  Do not get this area wet for one week. No showers for one week; you may shower on 10/23/2013.    The tape/steri-strips on your wound will fall off; do not pull them off.  No bandage is needed on the site.  DO NOT apply any creams, oils, or ointments to the wound area.   If you notice any drainage or discharge from the wound, any swelling or bruising at the site, or you develop a fever > 101? F after you are discharged home, call the office at once.  Special Instructions   You are still able to use cellular telephones; use the ear opposite the side where you have your pacemaker/defibrillator.  Avoid carrying your cellular phone near your device.   When traveling through airports, show security personnel your identification card to avoid being screened in the metal detectors.  Ask the security personnel to use the hand wand.   Avoid arc welding equipment, MRI testing (magnetic resonance imaging), TENS units (transcutaneous nerve stimulators).  Call the office for questions about other devices.   Avoid electrical appliances that are in poor condition or are not properly grounded.   Microwave ovens are safe to be near or to operate.

## 2013-10-27 ENCOUNTER — Ambulatory Visit (INDEPENDENT_AMBULATORY_CARE_PROVIDER_SITE_OTHER): Payer: Commercial Managed Care - HMO | Admitting: *Deleted

## 2013-10-27 DIAGNOSIS — I428 Other cardiomyopathies: Secondary | ICD-10-CM

## 2013-10-27 DIAGNOSIS — I429 Cardiomyopathy, unspecified: Secondary | ICD-10-CM

## 2013-10-27 DIAGNOSIS — I442 Atrioventricular block, complete: Secondary | ICD-10-CM

## 2013-10-29 LAB — MDC_IDC_ENUM_SESS_TYPE_INCLINIC
Battery Remaining Longevity: 43.2 mo
Brady Statistic RA Percent Paced: 14 %
Brady Statistic RV Percent Paced: 99.97 %
Date Time Interrogation Session: 20150202192903
Implantable Pulse Generator Model: 3242
Lead Channel Impedance Value: 412.5 Ohm
Lead Channel Impedance Value: 475 Ohm
Lead Channel Pacing Threshold Amplitude: 0.5 V
Lead Channel Pacing Threshold Amplitude: 0.5 V
Lead Channel Pacing Threshold Amplitude: 0.75 V
Lead Channel Pacing Threshold Amplitude: 0.75 V
Lead Channel Pacing Threshold Amplitude: 1.25 V
Lead Channel Pacing Threshold Pulse Width: 0.5 ms
Lead Channel Pacing Threshold Pulse Width: 0.5 ms
Lead Channel Pacing Threshold Pulse Width: 0.5 ms
Lead Channel Pacing Threshold Pulse Width: 0.5 ms
Lead Channel Sensing Intrinsic Amplitude: 5 mV
Lead Channel Setting Pacing Amplitude: 3.5 V
Lead Channel Setting Pacing Amplitude: 3.5 V
Lead Channel Setting Pacing Pulse Width: 0.5 ms
Lead Channel Setting Pacing Pulse Width: 0.5 ms
MDC IDC MSMT BATTERY VOLTAGE: 2.99 V
MDC IDC MSMT LEADCHNL LV IMPEDANCE VALUE: 387.5 Ohm
MDC IDC MSMT LEADCHNL LV PACING THRESHOLD AMPLITUDE: 1.25 V
MDC IDC MSMT LEADCHNL LV PACING THRESHOLD PULSEWIDTH: 0.5 ms
MDC IDC MSMT LEADCHNL RA PACING THRESHOLD PULSEWIDTH: 0.5 ms
MDC IDC MSMT LEADCHNL RV SENSING INTR AMPL: 12 mV
MDC IDC PG SERIAL: 7557114
MDC IDC SET LEADCHNL RV PACING AMPLITUDE: 3.5 V
MDC IDC SET LEADCHNL RV SENSING SENSITIVITY: 2 mV

## 2013-10-29 NOTE — Progress Notes (Signed)
Wound check appointment. Steri-strips removed. Wound without redness or edema. Incision edges approximated, wound well healed. Normal device function. Thresholds, sensing, and impedances consistent with implant measurements. Device programmed at 3.5V for extra safety margin until 3 month visit. Histogram distribution appropriate for patient and level of activity. No mode switches or high ventricular rates noted. Patient educated about wound care, arm mobility, lifting restrictions. ROV in 3 months with GT. 

## 2013-11-04 ENCOUNTER — Encounter: Payer: Self-pay | Admitting: Cardiovascular Disease

## 2013-11-04 ENCOUNTER — Ambulatory Visit (INDEPENDENT_AMBULATORY_CARE_PROVIDER_SITE_OTHER): Payer: Medicare HMO | Admitting: Cardiovascular Disease

## 2013-11-04 VITALS — BP 153/78 | HR 89 | Ht 63.0 in | Wt 205.0 lb

## 2013-11-04 DIAGNOSIS — I5022 Chronic systolic (congestive) heart failure: Secondary | ICD-10-CM

## 2013-11-04 DIAGNOSIS — I050 Rheumatic mitral stenosis: Secondary | ICD-10-CM

## 2013-11-04 DIAGNOSIS — I10 Essential (primary) hypertension: Secondary | ICD-10-CM

## 2013-11-04 DIAGNOSIS — I429 Cardiomyopathy, unspecified: Secondary | ICD-10-CM

## 2013-11-04 DIAGNOSIS — Z950 Presence of cardiac pacemaker: Secondary | ICD-10-CM

## 2013-11-04 DIAGNOSIS — I428 Other cardiomyopathies: Secondary | ICD-10-CM

## 2013-11-04 MED ORDER — HYDRALAZINE HCL 10 MG PO TABS: 20.0000 mg | ORAL_TABLET | Freq: Three times a day (TID) | ORAL | Status: DC

## 2013-11-04 NOTE — Patient Instructions (Signed)
   Increase Hydralazine to 20mg  three times per day - new sent to pharm  Continue all other medications.   Follow up in  4 months

## 2013-11-04 NOTE — Progress Notes (Addendum)
Patient ID: Cassandra Parker, female   DOB: 1941/07/24, 73 y.o.   MRN: [id number]      SUBJECTIVE: The patient is a 73 year old woman who has a history of hypertension, hyperlipidemia, breast cancer, insulin-dependent diabetes mellitus, and a cardiomyopathy. She was recently hospitalized for a syncopal episode and was found to be in complete heart block. An echocardiogram revealed an EF of 35-40%. She ultimately underwent placement of a BiV pacemaker. She also had moderate mitral stenosis with a mean gradient of 7 mmHg.    No Known Allergies  Current Outpatient Prescriptions  Medication Sig Dispense Refill  . acetaminophen (TYLENOL) 500 MG tablet Take 1,000 mg by mouth at bedtime.      Marland Kitchen aspirin EC 81 MG tablet Take 81 mg by mouth daily.        . carvedilol (COREG) 6.25 MG tablet Take 0.5 tablets (3.125 mg total) by mouth 2 (two) times daily.  30 tablet  4  . Cholecalciferol (VITAMIN D) 2000 UNITS tablet Take 2,000 Units by mouth daily.      Marland Kitchen docusate sodium (COLACE) 100 MG capsule Take 100 mg by mouth 2 (two) times daily.      . furosemide (LASIX) 40 MG tablet Take 40 mg by mouth daily.        Marland Kitchen glipiZIDE (GLUCOTROL XL) 5 MG 24 hr tablet Take 5 mg by mouth daily with breakfast.      . hydrALAZINE (APRESOLINE) 10 MG tablet Take 10 mg by mouth 3 (three) times daily.      . insulin regular (NOVOLIN R,HUMULIN R) 100 units/mL injection Inject 2-10 Units into the skin See admin instructions. Sliding scale.  check blood sugar levels in morning and at bedtime      . isosorbide mononitrate (IMDUR) 30 MG 24 hr tablet Take 30 mg by mouth daily.      Marland Kitchen lisinopril (PRINIVIL,ZESTRIL) 5 MG tablet Take 1 tablet (5 mg total) by mouth daily.  30 tablet  4  . omega-3 acid ethyl esters (LOVAZA) 1 G capsule Take 1 g by mouth daily.       . pravastatin (PRAVACHOL) 10 MG tablet Take 10 mg by mouth at bedtime.        No current facility-administered medications for this visit.    Past Medical History    Diagnosis Date  . Hypertension   . Hypercholesterolemia   . Diabetes mellitus     with neuropathy  . Cancer 2008    right breast  . Cardiomyopathy     dating back to 2010  . CHF (congestive heart failure) pt stated she was recently diagnosed in another hospital with heart failure and a "weak heart"    Past Surgical History  Procedure Laterality Date  . Mastectomy  2008     mmh-right  . Knee arthroscopy      right-mmh  . Cholecystectomy      mmh  . Appendectomy      mmh  . Cataract extraction w/phaco  06/29/2011    Procedure: CATARACT EXTRACTION PHACO AND INTRAOCULAR LENS PLACEMENT (IOC);  Surgeon: Tonny Branch;  Location: [facility];  Service: Ophthalmology;  Laterality: Right;  CDE: 14.41  . Cataract extraction w/phaco  09/04/2011    Procedure: CATARACT EXTRACTION PHACO AND INTRAOCULAR LENS PLACEMENT (IOC);  Surgeon: Tonny Branch;  Location: [facility];  Service: Ophthalmology;  Laterality: Left;  CDE: 15.21  . Cardiomyopathy      History   Social History  . Marital Status: Widowed  Spouse Name: N/A    Number of Children: N/A  . Years of Education: N/A   Occupational History  . Not on file.   Social History Main Topics  . Smoking status: Never Smoker   . Smokeless tobacco: Never Used  . Alcohol Use: No  . Drug Use: No  . Sexual Activity: Yes    Birth Control/ Protection: Post-menopausal   Other Topics Concern  . Not on file   Social History Narrative  . No narrative on file     Filed Vitals:   11/04/13 1330  Height: 5\' 3"  (1.6 m)  Weight: 205 lb (92.987 kg)   BP 153/78 Pulse 89   PHYSICAL EXAM General: NAD Neck: No JVD, no thyromegaly or thyroid nodule.  Lungs: Clear to auscultation bilaterally with normal respiratory effort. CV: Nondisplaced PMI.  Heart regular S1/split S2, no S3/S4, no murmur.  No peripheral edema.  No carotid bruit.  Normal pedal pulses.  Abdomen: Soft, nontender, no hepatosplenomegaly, no distention.  Neurologic: Alert and oriented x  3.  Psych: Normal affect. Extremities: No clubbing or cyanosis.   ECG: reviewed and available in electronic records.    Study Conclusions  - Left ventricle: There is pronounced paradoxical septal motion contributing to the low ejection fraction, remaining walls are mildly hypokinetic in its mid and apical segments. Overall LV EF is estimated at 35-40%. The cavity size was normal. There was moderate concentric hypertrophy. Systolic function was moderately reduced. The estimated ejection fraction was in the range of 35% to 40%. Doppler parameters are consistent with abnormal left ventricular relaxation (grade 1 diastolic dysfunction). Tissue Doppler was not performed, so we are unable to calculate the filling pressures. - Ventricular septum: Septal motion showed paradox. - Aortic valve: Trileaflet; mildly thickened leaflets. Transvalvular velocity was within the normal range. There was no stenosis. No regurgitation. - Aortic root: The aortic root was normal in size. - Mitral valve: Severe mitral annular calcifications, predominantly posterior involving the posterior leaflet that has limited motion.There is moderate mitral stenosis with peak gradient 20 mmHg and mean gradient 7 mmHg. Trivial regurgitation. Mean gradient: 59mm Hg (D). Peak gradient: 49mm Hg (D). Valve area by continuity equation (using LVOT flow): 1.8cm^2. - Left atrium: The atrium was mildly dilated. - Right ventricle: The cavity size was normal. Wall thickness was normal. Pacer wire or catheter noted in right ventricle. Systolic function was normal. - Right atrium: The atrium was normal in size. Pacer wire or catheter noted in right atrium. - Tricuspid valve: Trivial regurgitation. - Pulmonic valve: No regurgitation. Peak gradient: 2mm Hg (S). - Pulmonary arteries: Systolic pressure was within the normal range. - Pericardium, extracardiac: A trivial pericardial effusion was identified.   ASSESSMENT AND  PLAN: 1. S/p BiV PPM for complete heart block and cardiomyopathy with  pronounced paradoxical septal motion: 2. Cardiomyopathy (unknown etiology at this point): she is currently on Coreg and lisinopril, along with hydralazine and Imdur. She received chemotherapy for breast cancer in 2010 and I wonder if this is anthracycline-induced. Her BiV pacemaker was recently placed. I will reassess her LV systolic function in 3 months, as it should theoretically improve with more synchronous contractility. I will also consider obtaining a Lexiscan Cardiolite nuclear stress test to evaluate for an ischemic etiology in the future, after reassessing her LV function. 3. HTN: uncontrolled on present therapy which includes beta blocker and ACEI. BUN 40 and creatinine 1.87 on 1/22. Will increase hydralazine to 20 mg tid. 4. Moderate mitral stenosis: no signs  of heart failure at present. Mean gradient 7 mmHg. Continue current therapy.  Dispo: f/u in June 2015. Sees Dr. Lovena Le on 5/5.  Kate Sable, M.D., F.A.C.C.

## 2013-11-25 ENCOUNTER — Encounter: Payer: Self-pay | Admitting: Internal Medicine

## 2013-12-01 ENCOUNTER — Telehealth: Payer: Self-pay | Admitting: Cardiovascular Disease

## 2013-12-01 MED ORDER — ISOSORBIDE MONONITRATE ER 30 MG PO TB24: 30.0000 mg | ORAL_TABLET | Freq: Every day | ORAL | Status: DC

## 2013-12-01 NOTE — Telephone Encounter (Signed)
Attempted to return call - rec'd message that voice mail has not been set up yet.

## 2013-12-01 NOTE — Telephone Encounter (Signed)
Informed patient that she will be on this medication likely long term.  Per Dr. Bronson Ing last OV note, patient was to remain on all current medications.  Refill sent to pharmacy.

## 2013-12-01 NOTE — Telephone Encounter (Signed)
Patient was given Isosorbide 30 mg while she was in the hospital.  She was seen in the office on 11/04/13 and was told to continue her medications.  Does she need to refill her Isosorbide 30 mg?

## 2014-01-23 ENCOUNTER — Other Ambulatory Visit: Payer: Self-pay

## 2014-01-27 ENCOUNTER — Encounter: Payer: Self-pay | Admitting: Internal Medicine

## 2014-01-27 ENCOUNTER — Ambulatory Visit (INDEPENDENT_AMBULATORY_CARE_PROVIDER_SITE_OTHER): Payer: Medicare HMO | Admitting: Internal Medicine

## 2014-01-27 VITALS — BP 152/74 | HR 81 | Ht 63.0 in | Wt 208.0 lb

## 2014-01-27 DIAGNOSIS — I429 Cardiomyopathy, unspecified: Secondary | ICD-10-CM

## 2014-01-27 DIAGNOSIS — I428 Other cardiomyopathies: Secondary | ICD-10-CM

## 2014-01-27 DIAGNOSIS — I442 Atrioventricular block, complete: Secondary | ICD-10-CM

## 2014-01-27 LAB — MDC_IDC_ENUM_SESS_TYPE_INCLINIC
Battery Remaining Longevity: 78 mo
Battery Voltage: 2.98 V
Brady Statistic RA Percent Paced: 4.7 %
Date Time Interrogation Session: 20150505174306
Implantable Pulse Generator Serial Number: 7557114
Lead Channel Impedance Value: 550 Ohm
Lead Channel Pacing Threshold Amplitude: 0.75 V
Lead Channel Pacing Threshold Amplitude: 0.75 V
Lead Channel Pacing Threshold Pulse Width: 0.5 ms
Lead Channel Pacing Threshold Pulse Width: 0.5 ms
Lead Channel Pacing Threshold Pulse Width: 0.5 ms
Lead Channel Sensing Intrinsic Amplitude: 12 mV
Lead Channel Sensing Intrinsic Amplitude: 5 mV
Lead Channel Setting Pacing Amplitude: 2 V
Lead Channel Setting Pacing Amplitude: 2.5 V
Lead Channel Setting Pacing Pulse Width: 0.5 ms
Lead Channel Setting Pacing Pulse Width: 0.5 ms
Lead Channel Setting Sensing Sensitivity: 2 mV
MDC IDC MSMT LEADCHNL LV IMPEDANCE VALUE: 475 Ohm
MDC IDC MSMT LEADCHNL LV PACING THRESHOLD AMPLITUDE: 1.25 V
MDC IDC MSMT LEADCHNL LV PACING THRESHOLD AMPLITUDE: 1.25 V
MDC IDC MSMT LEADCHNL LV PACING THRESHOLD PULSEWIDTH: 0.5 ms
MDC IDC MSMT LEADCHNL RA IMPEDANCE VALUE: 475 Ohm
MDC IDC MSMT LEADCHNL RA PACING THRESHOLD AMPLITUDE: 0.75 V
MDC IDC MSMT LEADCHNL RA PACING THRESHOLD PULSEWIDTH: 0.5 ms
MDC IDC MSMT LEADCHNL RV PACING THRESHOLD AMPLITUDE: 0.75 V
MDC IDC MSMT LEADCHNL RV PACING THRESHOLD PULSEWIDTH: 0.5 ms
MDC IDC PG MODEL: 3242
MDC IDC SET LEADCHNL LV PACING AMPLITUDE: 2.25 V
MDC IDC STAT BRADY RV PERCENT PACED: 99.9 %

## 2014-01-27 NOTE — Patient Instructions (Addendum)
Remote monitoring is used to monitor your pacemaker from home. This monitoring reduces the number of office visits required to check your device to one time per year. It allows [place] to keep an eye on the functioning of your device to ensure it is working properly. You are scheduled for a device check from home on 04-30-2014. You may send your transmission at any time that day. If you have a wireless device, the transmission will be sent automatically. After your physician reviews your transmission, you will receive a postcard with your next transmission date.  Your physician recommends that you schedule a follow-up appointment in: Jan  with Dr.Gregg Lovena Le

## 2014-01-27 NOTE — Progress Notes (Signed)
HPI Cassandra Parker returns today for followup of her BiV PPM. She is a very pleasant 73 yo woman with a h/o CHB, s/p upgrade to a BiV PPM several months ago. She has done well in the interim with no chest pain or sob. No edema. She gets about but is not exercising regularly. No syncope. No Known Allergies   Current Outpatient Prescriptions  Medication Sig Dispense Refill  . acetaminophen (TYLENOL) 500 MG tablet Take 1,000 mg by mouth at bedtime.      Marland Kitchen aspirin EC 81 MG tablet Take 81 mg by mouth daily.        . carvedilol (COREG) 6.25 MG tablet Take 0.5 tablets (3.125 mg total) by mouth 2 (two) times daily.  30 tablet  4  . Cholecalciferol (VITAMIN D) 2000 UNITS tablet Take 2,000 Units by mouth daily.      Marland Kitchen docusate sodium (COLACE) 100 MG capsule Take 100 mg by mouth 2 (two) times daily.      . furosemide (LASIX) 40 MG tablet Take 40 mg by mouth daily.        Marland Kitchen glipiZIDE (GLUCOTROL XL) 5 MG 24 hr tablet Take 5 mg by mouth daily with breakfast.      . hydrALAZINE (APRESOLINE) 10 MG tablet Take 2 tablets (20 mg total) by mouth 3 (three) times daily.  180 tablet  6  . insulin regular (NOVOLIN R,HUMULIN R) 100 units/mL injection Inject 2-10 Units into the skin See admin instructions. Sliding scale.  check blood sugar levels in morning and at bedtime      . isosorbide mononitrate (IMDUR) 30 MG 24 hr tablet Take 1 tablet (30 mg total) by mouth daily.  90 tablet  3  . lisinopril (PRINIVIL,ZESTRIL) 20 MG tablet Take 20 mg by mouth daily.      Marland Kitchen omega-3 acid ethyl esters (LOVAZA) 1 G capsule Take 1 g by mouth daily.       . pravastatin (PRAVACHOL) 10 MG tablet Take 10 mg by mouth at bedtime.       Marland Kitchen VICTOZA 18 MG/3ML SOPN Take 1.2 mg by mouth daily.       No current facility-administered medications for this visit.     Past Medical History  Diagnosis Date  . Hypertension   . Hypercholesterolemia   . Diabetes mellitus     with neuropathy  . Cancer 2008    right breast  .  Cardiomyopathy     dating back to 2010  . CHF (congestive heart failure) pt stated she was recently diagnosed in another hospital with heart failure and a "weak heart"    ROS:   All systems reviewed and negative except as noted in the HPI.   Past Surgical History  Procedure Laterality Date  . Mastectomy  2008     mmh-right  . Knee arthroscopy      right-mmh  . Cholecystectomy      mmh  . Appendectomy      mmh  . Cataract extraction w/phaco  06/29/2011    Procedure: CATARACT EXTRACTION PHACO AND INTRAOCULAR LENS PLACEMENT (IOC);  Surgeon: Tonny Branch;  Location: [facility];  Service: Ophthalmology;  Laterality: Right;  CDE: 14.41  . Cataract extraction w/phaco  09/04/2011    Procedure: CATARACT EXTRACTION PHACO AND INTRAOCULAR LENS PLACEMENT (IOC);  Surgeon: Tonny Branch;  Location: [facility];  Service: Ophthalmology;  Laterality: Left;  CDE: 15.21  . Cardiomyopathy       Family History  Problem  Relation Age of Onset  . Anesthesia problems Neg Hx   . Hypotension Neg Hx   . Malignant hyperthermia Neg Hx   . Pseudochol deficiency Neg Hx      History   Social History  . Marital Status: Widowed    Spouse Name: N/A    Number of Children: N/A  . Years of Education: N/A   Occupational History  . Not on file.   Social History Main Topics  . Smoking status: Never Smoker   . Smokeless tobacco: Never Used  . Alcohol Use: No  . Drug Use: No  . Sexual Activity: Yes    Birth Control/ Protection: Post-menopausal   Other Topics Concern  . Not on file   Social History Narrative  . No narrative on file     BP 152/74  Pulse 81  Ht 5\' 3"  (1.6 m)  Wt 208 lb (94.348 kg)  BMI 36.85 kg/m2  Physical Exam:  Well appearing NAD HEENT: Unremarkable Neck:  No JVD, no thyromegally Back:  No CVA tenderness Lungs:  Clear with no wheezes HEART:  Regular rate rhythm, no murmurs, no rubs, no clicks Abd:  soft, positive bowel sounds, no organomegally, no rebound, no guarding Ext:  2  plus pulses, no edema, no cyanosis, no clubbing Skin:  No rashes no nodules Neuro:  CN II through XII intact, motor grossly intact   DEVICE  Normal device function.  See PaceArt for details.   Assess/Plan:

## 2014-03-05 ENCOUNTER — Encounter: Payer: Self-pay | Admitting: Cardiovascular Disease

## 2014-03-05 ENCOUNTER — Ambulatory Visit (INDEPENDENT_AMBULATORY_CARE_PROVIDER_SITE_OTHER): Payer: Medicare HMO | Admitting: Cardiovascular Disease

## 2014-03-05 VITALS — BP 132/78 | HR 80 | Ht 63.0 in | Wt 207.0 lb

## 2014-03-05 DIAGNOSIS — I499 Cardiac arrhythmia, unspecified: Secondary | ICD-10-CM

## 2014-03-05 DIAGNOSIS — I050 Rheumatic mitral stenosis: Secondary | ICD-10-CM

## 2014-03-05 DIAGNOSIS — I5022 Chronic systolic (congestive) heart failure: Secondary | ICD-10-CM

## 2014-03-05 DIAGNOSIS — I428 Other cardiomyopathies: Secondary | ICD-10-CM

## 2014-03-05 DIAGNOSIS — I429 Cardiomyopathy, unspecified: Secondary | ICD-10-CM

## 2014-03-05 DIAGNOSIS — Z950 Presence of cardiac pacemaker: Secondary | ICD-10-CM

## 2014-03-05 DIAGNOSIS — I442 Atrioventricular block, complete: Secondary | ICD-10-CM

## 2014-03-05 DIAGNOSIS — I10 Essential (primary) hypertension: Secondary | ICD-10-CM

## 2014-03-05 DIAGNOSIS — I498 Other specified cardiac arrhythmias: Secondary | ICD-10-CM

## 2014-03-05 NOTE — Patient Instructions (Signed)
Your physician has requested that you have an echocardiogram. Echocardiography is a painless test that uses sound waves to create images of your heart. It provides your doctor with information about the size and shape of your heart and how well your heart's chambers and valves are working. This procedure takes approximately one hour. There are no restrictions for this procedure. - DUE September Continue all current medications. Your physician wants you to follow up in: 4 months.  You will receive a reminder letter in the mail one-two months in advance.  If you don't receive a letter, please call our office to schedule the follow up appointment

## 2014-03-05 NOTE — Progress Notes (Signed)
Patient ID: Cassandra Parker, female   DOB: Apr 14, 1941, 73 y.o.   MRN: [id number]      SUBJECTIVE: The patient is a 73 year old woman who has a history of hypertension, hyperlipidemia, breast cancer, insulin-dependent diabetes mellitus, and a cardiomyopathy. She was hospitalized earlier this year for a syncopal episode and was found to be in complete heart block. An echocardiogram revealed an EF of 35-40%. She ultimately underwent placement of a BiV pacemaker. She also had moderate mitral stenosis with a mean gradient of 7 mmHg.  She is doing very well. Her breathing is much better. She denies leg swelling, chest pain, lightheadedness, dizziness, palpitations, orthopnea and paroxysmal nocturnal dyspnea.   No Known Allergies  Current Outpatient Prescriptions  Medication Sig Dispense Refill  . acetaminophen (TYLENOL) 500 MG tablet Take 1,000 mg by mouth at bedtime.      Marland Kitchen aspirin EC 81 MG tablet Take 81 mg by mouth daily.        . carvedilol (COREG) 6.25 MG tablet Take 0.5 tablets (3.125 mg total) by mouth 2 (two) times daily.  30 tablet  4  . Cholecalciferol (VITAMIN D) 2000 UNITS tablet Take 2,000 Units by mouth daily.      Marland Kitchen docusate sodium (COLACE) 100 MG capsule Take 100 mg by mouth 2 (two) times daily.      . furosemide (LASIX) 40 MG tablet Take 40 mg by mouth daily.        Marland Kitchen glipiZIDE (GLUCOTROL XL) 5 MG 24 hr tablet Take 5 mg by mouth daily with breakfast.      . hydrALAZINE (APRESOLINE) 10 MG tablet Take 2 tablets (20 mg total) by mouth 3 (three) times daily.  180 tablet  6  . isosorbide mononitrate (IMDUR) 30 MG 24 hr tablet Take 1 tablet (30 mg total) by mouth daily.  90 tablet  3  . lisinopril (PRINIVIL,ZESTRIL) 20 MG tablet Take 20 mg by mouth daily.      Marland Kitchen omega-3 acid ethyl esters (LOVAZA) 1 G capsule Take 1 g by mouth daily.       . pravastatin (PRAVACHOL) 10 MG tablet Take 10 mg by mouth at bedtime.       Marland Kitchen VICTOZA 18 MG/3ML SOPN Take 1.2 mg by mouth daily.       No  current facility-administered medications for this visit.    Past Medical History  Diagnosis Date  . Hypertension   . Hypercholesterolemia   . Diabetes mellitus     with neuropathy  . Cancer 2008    right breast  . Cardiomyopathy     dating back to 2010  . CHF (congestive heart failure) pt stated she was recently diagnosed in another hospital with heart failure and a "weak heart"    Past Surgical History  Procedure Laterality Date  . Mastectomy  2008     mmh-right  . Knee arthroscopy      right-mmh  . Cholecystectomy      mmh  . Appendectomy      mmh  . Cataract extraction w/phaco  06/29/2011    Procedure: CATARACT EXTRACTION PHACO AND INTRAOCULAR LENS PLACEMENT (IOC);  Surgeon: Tonny Branch;  Location: [facility];  Service: Ophthalmology;  Laterality: Right;  CDE: 14.41  . Cataract extraction w/phaco  09/04/2011    Procedure: CATARACT EXTRACTION PHACO AND INTRAOCULAR LENS PLACEMENT (IOC);  Surgeon: Tonny Branch;  Location: [facility];  Service: Ophthalmology;  Laterality: Left;  CDE: 15.21  . Cardiomyopathy      History  Social History  . Marital Status: Widowed    Spouse Name: N/A    Number of Children: N/A  . Years of Education: N/A   Occupational History  . Not on file.   Social History Main Topics  . Smoking status: Never Smoker   . Smokeless tobacco: Never Used  . Alcohol Use: No  . Drug Use: No  . Sexual Activity: Yes    Birth Control/ Protection: Post-menopausal   Other Topics Concern  . Not on file   Social History Narrative  . No narrative on file     Filed Vitals:   03/05/14 0955  BP: 132/78  Pulse: 80  Height: 5\' 3"  (1.6 m)  Weight: 207 lb (93.895 kg)    PHYSICAL EXAM General: NAD  Neck: No JVD, no thyromegaly or thyroid nodule.  Lungs: Clear to auscultation bilaterally with normal respiratory effort.  CV: Nondisplaced PMI. Heart regular S1/split S2, no S3/S4, no murmur. No peripheral edema. No carotid bruit. Normal pedal pulses.  Abdomen:  Soft, nontender, no hepatosplenomegaly, no distention.  Neurologic: Alert and oriented x 3.  Psych: Normal affect.  Extremities: No clubbing or cyanosis.   ECG: reviewed and available in electronic records.      ASSESSMENT AND PLAN: 1. S/p BiV PPM for complete heart block and cardiomyopathy with pronounced paradoxical septal motion: Stable with normal device function on 5/5, with 4 mode switches (<1%) and 1 episode of SVT. Will need to make sure there aren't recurrences of atrial fibrillation/flutter (higher propensity for this given moderate mitral stenosis), as she would then require anticoagulation. Follows with Dr. Lovena Le. 2. Cardiomyopathy (unknown etiology at this point): She is currently on Coreg and lisinopril, along with hydralazine and Imdur. She received chemotherapy for breast cancer in 2010 and I wonder if this is anthracycline-induced. Her BiV pacemaker is functioning appropriately. I will reassess her LV systolic function in September, as it should have theoretically improved with more synchronous contractility. I will also consider obtaining a Lexiscan Cardiolite nuclear stress test to evaluate for an ischemic etiology in the future, after reassessing her LV function. She is euvolemic and compensated. 3. HTN: Controlled on present therapy which includes beta blocker, hydralazine, and ACEI.  4. Moderate mitral stenosis: No signs of heart failure at present. Mean gradient 7 mmHg. Continue current therapy. Monitor for atrial arrhythmias.  Dispo: f/u in October, echo in September.   Kate Sable, M.D., F.A.C.C.

## 2014-04-30 ENCOUNTER — Telehealth: Payer: Self-pay | Admitting: Cardiology

## 2014-04-30 ENCOUNTER — Ambulatory Visit (INDEPENDENT_AMBULATORY_CARE_PROVIDER_SITE_OTHER): Payer: Commercial Managed Care - HMO | Admitting: *Deleted

## 2014-04-30 ENCOUNTER — Encounter: Payer: Self-pay | Admitting: Internal Medicine

## 2014-04-30 DIAGNOSIS — I442 Atrioventricular block, complete: Secondary | ICD-10-CM

## 2014-04-30 LAB — MDC_IDC_ENUM_SESS_TYPE_REMOTE
Battery Voltage: 2.99 V
Brady Statistic AP VS Percent: 1 %
Brady Statistic AS VS Percent: 1 %
Implantable Pulse Generator Serial Number: 7557114
Lead Channel Impedance Value: 450 Ohm
Lead Channel Impedance Value: 480 Ohm
Lead Channel Impedance Value: 510 Ohm
Lead Channel Pacing Threshold Amplitude: 1.25 V
Lead Channel Pacing Threshold Pulse Width: 0.5 ms
Lead Channel Pacing Threshold Pulse Width: 0.5 ms
Lead Channel Pacing Threshold Pulse Width: 0.5 ms
Lead Channel Sensing Intrinsic Amplitude: 5 mV
Lead Channel Setting Pacing Amplitude: 2 V
Lead Channel Setting Pacing Amplitude: 2.25 V
Lead Channel Setting Pacing Amplitude: 2.5 V
Lead Channel Setting Pacing Pulse Width: 0.5 ms
Lead Channel Setting Pacing Pulse Width: 0.5 ms
Lead Channel Setting Sensing Sensitivity: 2 mV
MDC IDC MSMT BATTERY REMAINING LONGEVITY: 81 mo
MDC IDC MSMT BATTERY REMAINING PERCENTAGE: 94 %
MDC IDC MSMT LEADCHNL RA PACING THRESHOLD AMPLITUDE: 0.75 V
MDC IDC MSMT LEADCHNL RV PACING THRESHOLD AMPLITUDE: 0.75 V
MDC IDC MSMT LEADCHNL RV SENSING INTR AMPL: 12 mV
MDC IDC PG MODEL: 3242
MDC IDC SESS DTM: 20150806191019
MDC IDC STAT BRADY AP VP PERCENT: 1 %
MDC IDC STAT BRADY AS VP PERCENT: 99 %
MDC IDC STAT BRADY RA PERCENT PACED: 1 %

## 2014-04-30 NOTE — Telephone Encounter (Signed)
Spoke with pt and reminded pt of remote transmission that is due today. Pt verbalized understanding.   

## 2014-05-01 NOTE — Progress Notes (Signed)
Remote pacemaker transmission.   

## 2014-05-08 ENCOUNTER — Encounter: Payer: Self-pay | Admitting: Cardiology

## 2014-06-04 ENCOUNTER — Other Ambulatory Visit: Payer: Self-pay

## 2014-06-04 ENCOUNTER — Other Ambulatory Visit (INDEPENDENT_AMBULATORY_CARE_PROVIDER_SITE_OTHER): Payer: Commercial Managed Care - HMO

## 2014-06-04 DIAGNOSIS — I050 Rheumatic mitral stenosis: Secondary | ICD-10-CM

## 2014-06-04 DIAGNOSIS — I428 Other cardiomyopathies: Secondary | ICD-10-CM

## 2014-06-04 DIAGNOSIS — I059 Rheumatic mitral valve disease, unspecified: Secondary | ICD-10-CM

## 2014-06-04 DIAGNOSIS — I5022 Chronic systolic (congestive) heart failure: Secondary | ICD-10-CM

## 2014-06-04 DIAGNOSIS — I429 Cardiomyopathy, unspecified: Secondary | ICD-10-CM

## 2014-06-08 ENCOUNTER — Encounter: Payer: Self-pay | Admitting: *Deleted

## 2014-07-14 ENCOUNTER — Encounter: Payer: Self-pay | Admitting: Cardiovascular Disease

## 2014-07-14 ENCOUNTER — Ambulatory Visit (INDEPENDENT_AMBULATORY_CARE_PROVIDER_SITE_OTHER): Payer: Commercial Managed Care - HMO | Admitting: Cardiovascular Disease

## 2014-07-14 VITALS — BP 138/77 | HR 73 | Ht 63.0 in | Wt 215.0 lb

## 2014-07-14 DIAGNOSIS — I5022 Chronic systolic (congestive) heart failure: Secondary | ICD-10-CM

## 2014-07-14 DIAGNOSIS — I499 Cardiac arrhythmia, unspecified: Secondary | ICD-10-CM

## 2014-07-14 DIAGNOSIS — I498 Other specified cardiac arrhythmias: Secondary | ICD-10-CM

## 2014-07-14 DIAGNOSIS — I442 Atrioventricular block, complete: Secondary | ICD-10-CM

## 2014-07-14 DIAGNOSIS — I10 Essential (primary) hypertension: Secondary | ICD-10-CM

## 2014-07-14 DIAGNOSIS — I050 Rheumatic mitral stenosis: Secondary | ICD-10-CM

## 2014-07-14 DIAGNOSIS — I429 Cardiomyopathy, unspecified: Secondary | ICD-10-CM

## 2014-07-14 DIAGNOSIS — Z950 Presence of cardiac pacemaker: Secondary | ICD-10-CM

## 2014-07-14 MED ORDER — FUROSEMIDE 40 MG PO TABS: 20.0000 mg | ORAL_TABLET | Freq: Every day | ORAL | Status: DC

## 2014-07-14 NOTE — Patient Instructions (Signed)
   Decrease Lasix to 20mg  daily (may take 1/2 tab of your 40mg  tablet)  Stop Hydralazine  Stop Imdur Continue all other medications.   Your physician wants you to follow up in: 6 months.  You will receive a reminder letter in the mail one-two months in advance.  If you don't receive a letter, please call our office to schedule the follow up appointment

## 2014-07-14 NOTE — Progress Notes (Signed)
Patient ID: [name], female   DOB: [birth year], 73 y.o.   MRN: [id number]      SUBJECTIVE: The patient is a 73 year old woman who has a history of hypertension, hyperlipidemia, breast cancer, insulin-dependent diabetes mellitus, and a cardiomyopathy. She was hospitalized earlier this year for a syncopal episode and was found to be in complete heart block. An echocardiogram at that time revealed an EF of 35-40%. She ultimately underwent placement of a BiV pacemaker. She also had moderate mitral stenosis with a mean gradient of 7 mmHg.  A repeat echocardiogram on 06/04/2014 demonstrated normalization of left ventricle systolic function, EF 0000000, grade 1 diastolic dysfunction, elevated filling pressures, moderate left atrial dilatation, and mild mitral stenosis with mean gradient of 3 mm mercury.  The patient denies any symptoms of chest pain, palpitations, shortness of breath, lightheadedness, dizziness, leg swelling, orthopnea, PND, and syncope.   Review of Systems: As per "subjective", otherwise negative.  No Known Allergies  Current Outpatient Prescriptions  Medication Sig Dispense Refill  . acetaminophen (TYLENOL) 500 MG tablet Take 1,000 mg by mouth at bedtime.      Marland Kitchen aspirin EC 81 MG tablet Take 81 mg by mouth daily.        . carvedilol (COREG) 6.25 MG tablet Take 0.5 tablets (3.125 mg total) by mouth 2 (two) times daily.  30 tablet  4  . Cholecalciferol (VITAMIN D) 2000 UNITS tablet Take 2,000 Units by mouth daily.      Marland Kitchen docusate sodium (COLACE) 100 MG capsule Take 100 mg by mouth 2 (two) times daily.      . furosemide (LASIX) 40 MG tablet Take 40 mg by mouth daily.        Marland Kitchen glipiZIDE (GLUCOTROL XL) 5 MG 24 hr tablet Take 5 mg by mouth daily with breakfast.      . hydrALAZINE (APRESOLINE) 10 MG tablet Take 2 tablets (20 mg total) by mouth 3 (three) times daily.  180 tablet  6  . isosorbide mononitrate (IMDUR) 30 MG 24 hr tablet Take 1 tablet (30 mg total) by mouth daily.   90 tablet  3  . lisinopril (PRINIVIL,ZESTRIL) 20 MG tablet Take 20 mg by mouth daily.      Marland Kitchen omega-3 acid ethyl esters (LOVAZA) 1 G capsule Take 1 g by mouth daily.       . pravastatin (PRAVACHOL) 10 MG tablet Take 10 mg by mouth at bedtime.       Marland Kitchen VICTOZA 18 MG/3ML SOPN Take 1.2 mg by mouth daily.       No current facility-administered medications for this visit.    Past Medical History  Diagnosis Date  . Hypertension   . Hypercholesterolemia   . Diabetes mellitus     with neuropathy  . Cancer 2008    right breast  . Cardiomyopathy     dating back to 2010  . CHF (congestive heart failure) pt stated she was recently diagnosed in another hospital with heart failure and a "weak heart"    Past Surgical History  Procedure Laterality Date  . Mastectomy  2008     mmh-right  . Knee arthroscopy      right-mmh  . Cholecystectomy      mmh  . Appendectomy      mmh  . Cataract extraction w/phaco  06/29/2011    Procedure: CATARACT EXTRACTION PHACO AND INTRAOCULAR LENS PLACEMENT (IOC);  Surgeon: Tonny Branch;  Location: [facility];  Service: Ophthalmology;  Laterality: Right;  CDE: 14.41  .  Cataract extraction w/phaco  09/04/2011    Procedure: CATARACT EXTRACTION PHACO AND INTRAOCULAR LENS PLACEMENT (IOC);  Surgeon: Tonny Branch;  Location: [facility];  Service: Ophthalmology;  Laterality: Left;  CDE: 15.21  . Cardiomyopathy      History   Social History  . Marital Status: Widowed    Spouse Name: N/A    Number of Children: N/A  . Years of Education: N/A   Occupational History  . Not on file.   Social History Main Topics  . Smoking status: Never Smoker   . Smokeless tobacco: Never Used  . Alcohol Use: No  . Drug Use: No  . Sexual Activity: Yes    Birth Control/ Protection: Post-menopausal   Other Topics Concern  . Not on file   Social History Narrative  . No narrative on file     Filed Vitals:   07/14/14 1053  BP: 138/77  Pulse: 73  Height: 5\' 3"  (1.6 m)  Weight: 215 lb  (97.523 kg)  SpO2: 100%    PHYSICAL EXAM General: NAD HEENT: Normal. Neck: No JVD, no thyromegaly. Lungs: Clear to auscultation bilaterally with normal respiratory effort. CV: Nondisplaced PMI.  Regular rate and rhythm, normal S1/S2, no XX123456, I/VI systolic murmur along left sternal border. No pretibial or periankle edema.  No carotid bruit.  Normal pedal pulses.  Abdomen: Soft, nontender, no hepatosplenomegaly, no distention.  Neurologic: Alert and oriented x 3.  Psych: Normal affect. Skin: Normal. Musculoskeletal: Normal range of motion, no gross deformities. Extremities: No clubbing or cyanosis.   ECG: Most recent ECG reviewed.      ASSESSMENT AND PLAN: 1. S/p BiV PPM for complete heart block and cardiomyopathy with pronounced paradoxical septal motion: Stable with normal device function on 04/30/14, with no mode switches  And 100% bi-V pacing. Will need to make sure there aren't recurrences of atrial fibrillation/flutter (higher propensity for this given mild mitral stenosis), as she would then require anticoagulation. Follows with Dr. Lovena Le.  2. Cardiomyopathy: Normalization of LV systolic function by echo on 06/04/14. Currently on Coreg and lisinopril. I will discontinue hydralazine and Imdur. She received chemotherapy for breast cancer in 2010 and I wonder if this was anthracycline-induced. Her BiV pacemaker is functioning appropriately. She is euvolemic and compensated. Will reduce Lasix to 20 mg daily. 3. Essential HTN: Controlled on present therapy which includes beta blocker and ACEI. If this becomes elevated after stopping hydralazine, I would increase lisinopril. 4. Mild mitral stenosis: No signs of heart failure at present. Mean gradient 3 mmHg. Continue current therapy. Monitor for atrial arrhythmias.   Dispo: f/u in 6 months.  Kate Sable, M.D., F.A.C.C.

## 2014-08-03 ENCOUNTER — Encounter: Payer: Self-pay | Admitting: Internal Medicine

## 2014-08-03 ENCOUNTER — Ambulatory Visit (INDEPENDENT_AMBULATORY_CARE_PROVIDER_SITE_OTHER): Payer: Commercial Managed Care - HMO | Admitting: *Deleted

## 2014-08-03 DIAGNOSIS — I442 Atrioventricular block, complete: Secondary | ICD-10-CM

## 2014-08-03 DIAGNOSIS — I5022 Chronic systolic (congestive) heart failure: Secondary | ICD-10-CM

## 2014-08-03 LAB — MDC_IDC_ENUM_SESS_TYPE_REMOTE
Battery Remaining Longevity: 79 mo
Battery Remaining Percentage: 92 %
Battery Voltage: 2.99 V
Brady Statistic AS VP Percent: 99 %
Brady Statistic RA Percent Paced: 1 %
Date Time Interrogation Session: 20151109134422
Implantable Pulse Generator Serial Number: 7557114
Lead Channel Impedance Value: 440 Ohm
Lead Channel Impedance Value: 490 Ohm
Lead Channel Impedance Value: 490 Ohm
Lead Channel Setting Pacing Amplitude: 2 V
Lead Channel Setting Pacing Amplitude: 2.25 V
Lead Channel Setting Pacing Amplitude: 2.5 V
Lead Channel Setting Pacing Pulse Width: 0.5 ms
Lead Channel Setting Sensing Sensitivity: 2 mV
MDC IDC MSMT LEADCHNL RA SENSING INTR AMPL: 5 mV
MDC IDC PG MODEL: 3242
MDC IDC SET LEADCHNL LV PACING PULSEWIDTH: 0.5 ms
MDC IDC STAT BRADY AP VP PERCENT: 1 %
MDC IDC STAT BRADY AP VS PERCENT: 1 %
MDC IDC STAT BRADY AS VS PERCENT: 1 %

## 2014-08-03 NOTE — Progress Notes (Signed)
Remote pacemaker transmission.   

## 2014-08-12 ENCOUNTER — Encounter: Payer: Self-pay | Admitting: Cardiology

## 2014-09-03 ENCOUNTER — Encounter (HOSPITAL_COMMUNITY): Payer: Self-pay | Admitting: Internal Medicine

## 2014-10-09 DIAGNOSIS — E119 Type 2 diabetes mellitus without complications: Secondary | ICD-10-CM | POA: Diagnosis not present

## 2014-10-09 DIAGNOSIS — Z7982 Long term (current) use of aspirin: Secondary | ICD-10-CM | POA: Diagnosis not present

## 2014-10-09 DIAGNOSIS — Z853 Personal history of malignant neoplasm of breast: Secondary | ICD-10-CM | POA: Diagnosis not present

## 2014-10-09 DIAGNOSIS — Z79899 Other long term (current) drug therapy: Secondary | ICD-10-CM | POA: Diagnosis not present

## 2014-10-09 DIAGNOSIS — Z9221 Personal history of antineoplastic chemotherapy: Secondary | ICD-10-CM | POA: Diagnosis not present

## 2014-10-09 DIAGNOSIS — I10 Essential (primary) hypertension: Secondary | ICD-10-CM | POA: Diagnosis not present

## 2014-10-09 DIAGNOSIS — I2510 Atherosclerotic heart disease of native coronary artery without angina pectoris: Secondary | ICD-10-CM | POA: Diagnosis not present

## 2014-10-09 DIAGNOSIS — Z1211 Encounter for screening for malignant neoplasm of colon: Secondary | ICD-10-CM | POA: Diagnosis not present

## 2014-10-23 ENCOUNTER — Encounter: Payer: Self-pay | Admitting: Internal Medicine

## 2014-10-23 ENCOUNTER — Ambulatory Visit (INDEPENDENT_AMBULATORY_CARE_PROVIDER_SITE_OTHER): Payer: Commercial Managed Care - HMO | Admitting: Internal Medicine

## 2014-10-23 VITALS — BP 162/82 | HR 84 | Ht 63.0 in | Wt 219.2 lb

## 2014-10-23 DIAGNOSIS — I498 Other specified cardiac arrhythmias: Secondary | ICD-10-CM

## 2014-10-23 DIAGNOSIS — I499 Cardiac arrhythmia, unspecified: Secondary | ICD-10-CM

## 2014-10-23 DIAGNOSIS — E785 Hyperlipidemia, unspecified: Secondary | ICD-10-CM | POA: Diagnosis not present

## 2014-10-23 DIAGNOSIS — I5022 Chronic systolic (congestive) heart failure: Secondary | ICD-10-CM

## 2014-10-23 DIAGNOSIS — I442 Atrioventricular block, complete: Secondary | ICD-10-CM

## 2014-10-23 DIAGNOSIS — I429 Cardiomyopathy, unspecified: Secondary | ICD-10-CM | POA: Diagnosis not present

## 2014-10-23 DIAGNOSIS — I10 Essential (primary) hypertension: Secondary | ICD-10-CM

## 2014-10-23 DIAGNOSIS — Z950 Presence of cardiac pacemaker: Secondary | ICD-10-CM

## 2014-10-23 HISTORY — DX: Presence of cardiac pacemaker: Z95.0

## 2014-10-23 LAB — MDC_IDC_ENUM_SESS_TYPE_INCLINIC
Battery Remaining Longevity: 80.4 mo
Brady Statistic RA Percent Paced: 1.1 %
Brady Statistic RV Percent Paced: 99.84 %
Implantable Pulse Generator Model: 3242
Implantable Pulse Generator Serial Number: 7557114
Lead Channel Impedance Value: 412.5 Ohm
Lead Channel Impedance Value: 425 Ohm
Lead Channel Pacing Threshold Amplitude: 0.5 V
Lead Channel Pacing Threshold Amplitude: 0.75 V
Lead Channel Pacing Threshold Amplitude: 1.5 V
Lead Channel Pacing Threshold Amplitude: 1.5 V
Lead Channel Pacing Threshold Pulse Width: 0.5 ms
Lead Channel Pacing Threshold Pulse Width: 0.5 ms
Lead Channel Pacing Threshold Pulse Width: 0.5 ms
Lead Channel Pacing Threshold Pulse Width: 0.5 ms
Lead Channel Pacing Threshold Pulse Width: 0.5 ms
Lead Channel Sensing Intrinsic Amplitude: 12 mV
Lead Channel Sensing Intrinsic Amplitude: 5 mV
Lead Channel Setting Pacing Amplitude: 2 V
Lead Channel Setting Pacing Amplitude: 2.5 V
Lead Channel Setting Sensing Sensitivity: 2 mV
MDC IDC MSMT BATTERY VOLTAGE: 2.99 V
MDC IDC MSMT LEADCHNL RA PACING THRESHOLD AMPLITUDE: 0.5 V
MDC IDC MSMT LEADCHNL RV IMPEDANCE VALUE: 512.5 Ohm
MDC IDC MSMT LEADCHNL RV PACING THRESHOLD AMPLITUDE: 0.75 V
MDC IDC MSMT LEADCHNL RV PACING THRESHOLD PULSEWIDTH: 0.5 ms
MDC IDC SESS DTM: 20160129100504
MDC IDC SET LEADCHNL LV PACING PULSEWIDTH: 0.5 ms
MDC IDC SET LEADCHNL RV PACING AMPLITUDE: 2.5 V
MDC IDC SET LEADCHNL RV PACING PULSEWIDTH: 0.5 ms

## 2014-10-23 NOTE — Assessment & Plan Note (Signed)
Her BiV PM ([employer]) is working normally. Will recheck in several months.

## 2014-10-23 NOTE — Progress Notes (Signed)
HPI Cassandra Parker returns today for followup. She is a pleasant 74 yo woman with a h/o complete heart block and chronic systolic heart failure, EF 35% who underwent BiV PM insertion over a year ago. Repeat echo 4 months ago demonstrated normalization of her LV function. In the interim, the patient reports feeling well. She denies chest pain or sob. No syncope or peripheral edema. No Known Allergies   Current Outpatient Prescriptions  Medication Sig Dispense Refill  . acetaminophen (TYLENOL) 500 MG tablet Take 1,000 mg by mouth at bedtime.    Marland Kitchen aspirin EC 81 MG tablet Take 81 mg by mouth daily.      . carvedilol (COREG) 6.25 MG tablet Take 0.5 tablets (3.125 mg total) by mouth 2 (two) times daily. 30 tablet 4  . Cholecalciferol (VITAMIN D) 2000 UNITS tablet Take 2,000 Units by mouth daily.    Marland Kitchen docusate sodium (COLACE) 100 MG capsule Take 100 mg by mouth 2 (two) times daily.    Marland Kitchen glipiZIDE (GLUCOTROL XL) 5 MG 24 hr tablet Take 5 mg by mouth daily with breakfast.    . lisinopril (PRINIVIL,ZESTRIL) 10 MG tablet Take 1 tablet by mouth daily.    . pravastatin (PRAVACHOL) 10 MG tablet Take 10 mg by mouth at bedtime.     Marland Kitchen VICTOZA 18 MG/3ML SOPN Take 1.2 mg by mouth daily.     No current facility-administered medications for this visit.     Past Medical History  Diagnosis Date  . Hypertension   . Hypercholesterolemia   . Diabetes mellitus     with neuropathy  . Cancer 2008    right breast  . Cardiomyopathy     dating back to 2010  . CHF (congestive heart failure) pt stated she was recently diagnosed in another hospital with heart failure and a "weak heart"    ROS:   All systems reviewed and negative except as noted in the HPI.   Past Surgical History  Procedure Laterality Date  . Mastectomy  2008     mmh-right  . Knee arthroscopy      right-mmh  . Cholecystectomy      mmh  . Appendectomy      mmh  . Cataract extraction w/phaco  06/29/2011    Procedure: CATARACT  EXTRACTION PHACO AND INTRAOCULAR LENS PLACEMENT (IOC);  Surgeon: Tonny Branch;  Location: [facility];  Service: Ophthalmology;  Laterality: Right;  CDE: 14.41  . Cataract extraction w/phaco  09/04/2011    Procedure: CATARACT EXTRACTION PHACO AND INTRAOCULAR LENS PLACEMENT (IOC);  Surgeon: Tonny Branch;  Location: [facility];  Service: Ophthalmology;  Laterality: Left;  CDE: 15.21  . Cardiomyopathy    . Temporary pacemaker insertion N/A 10/13/2013    Procedure: TEMPORARY PACEMAKER INSERTION;  Surgeon: Jolaine Artist, MD;  Location: [facility] CATH LAB;  Service: Cardiovascular;  Laterality: N/A;  . Bi-ventricular pacemaker insertion N/A 10/15/2013    Procedure: BI-VENTRICULAR PACEMAKER INSERTION (CRT-P);  Surgeon: Evans Lance, MD;  Location: [facility] CATH LAB;  Service: Cardiovascular;  Laterality: N/A;     Family History  Problem Relation Age of Onset  . Anesthesia problems Neg Hx   . Hypotension Neg Hx   . Malignant hyperthermia Neg Hx   . Pseudochol deficiency Neg Hx      History   Social History  . Marital Status: Widowed    Spouse Name: N/A    Number of Children: N/A  . Years of Education: N/A   Occupational History  .  Not on file.   Social History Main Topics  . Smoking status: Never Smoker   . Smokeless tobacco: Never Used  . Alcohol Use: No  . Drug Use: No  . Sexual Activity: Yes    Birth Control/ Protection: Post-menopausal   Other Topics Concern  . Not on file   Social History Narrative     Ht 5\' 3"  (1.6 m)  Wt 219 lb 4 oz (99.451 kg)  BMI 38.85 kg/m2  Physical Exam:  Well appearing 74 yo woman, NAD HEENT: Unremarkable Neck:  6 cm JVD, no thyromegally Back:  No CVA tenderness Lungs:  Clear with no wheezes HEART:  Regular rate rhythm, no murmurs, no rubs, no clicks Abd:  soft, positive bowel sounds, no organomegally, no rebound, no guarding Ext:  2 plus pulses, no edema, no cyanosis, no clubbing Skin:  No rashes no nodules Neuro:  CN II through XII intact, motor  grossly intact  EKG -AV sequential BiV pacing  DEVICE  Normal device function.  See PaceArt for details.   Assess/Plan:

## 2014-10-23 NOTE — Assessment & Plan Note (Signed)
Her blood pressure is high today. She notes that it has been well controlled previously. She did not take her medications this morning. Will hold off on uptitration of her meds for now but will ask the patient to lose weight and eat less sodium.

## 2014-10-23 NOTE — Patient Instructions (Addendum)
Remote monitoring is used to monitor your pacemaker from home. This monitoring reduces the number of office visits required to check your device to one time per year. It allows [place] to keep an eye on the functioning of your device to ensure it is working properly. You are scheduled for a device check from home on 01-25-2015. You may send your transmission at any time that day. If you have a wireless device, the transmission will be sent automatically. After your physician reviews your transmission, you will receive a postcard with your next transmission date.  Your physician recommends that you schedule a follow-up appointment in: 12 months with Dr.Taylor in the [place] office.  Your physician recommends that you continue on your current medications as directed. Please refer to the Current Medication list given to you today.

## 2014-10-23 NOTE — Assessment & Plan Note (Signed)
She is encouraged to eat less and increase her physical activity and lose weight.

## 2014-12-01 ENCOUNTER — Telehealth: Payer: Self-pay | Admitting: Internal Medicine

## 2014-12-01 NOTE — Telephone Encounter (Signed)
Sunday night pt felt a "shivering in her stomach" under pacemaker , asked her if it felt like it was vibrating, she said yes a little , pls advise [phone]--

## 2014-12-01 NOTE — Telephone Encounter (Signed)
Spoke to patient about the "shivering" in her chest from Sunday night. Patient states that she woke up around 430 am (morning of 3/7) bc of a "vibraition" she felt under the pacemaker as well as SOB, which she believes was caused by the anxiety from the episode. I informed her that the ppm does not vibrate and actually shows normal device function. I did inform her that she had a brief episode of SVT at that time. Patient voiced understanding. She denied actual CP. I encouraged her to call if she experiences the vibration again. Again, pt voiced understanding.  Will inform GT of episode and pt sx's.

## 2014-12-15 ENCOUNTER — Encounter: Payer: Self-pay | Admitting: Internal Medicine

## 2014-12-28 DIAGNOSIS — E1121 Type 2 diabetes mellitus with diabetic nephropathy: Secondary | ICD-10-CM | POA: Diagnosis not present

## 2014-12-28 DIAGNOSIS — I10 Essential (primary) hypertension: Secondary | ICD-10-CM | POA: Diagnosis not present

## 2014-12-28 DIAGNOSIS — E780 Pure hypercholesterolemia: Secondary | ICD-10-CM | POA: Diagnosis not present

## 2015-01-04 DIAGNOSIS — I10 Essential (primary) hypertension: Secondary | ICD-10-CM | POA: Diagnosis not present

## 2015-01-04 DIAGNOSIS — N183 Chronic kidney disease, stage 3 (moderate): Secondary | ICD-10-CM | POA: Diagnosis not present

## 2015-01-04 DIAGNOSIS — E780 Pure hypercholesterolemia: Secondary | ICD-10-CM | POA: Diagnosis not present

## 2015-01-04 DIAGNOSIS — M1712 Unilateral primary osteoarthritis, left knee: Secondary | ICD-10-CM | POA: Diagnosis not present

## 2015-01-04 DIAGNOSIS — E1121 Type 2 diabetes mellitus with diabetic nephropathy: Secondary | ICD-10-CM | POA: Diagnosis not present

## 2015-01-04 DIAGNOSIS — R808 Other proteinuria: Secondary | ICD-10-CM | POA: Diagnosis not present

## 2015-01-19 ENCOUNTER — Encounter: Payer: Self-pay | Admitting: Cardiovascular Disease

## 2015-01-19 ENCOUNTER — Ambulatory Visit (INDEPENDENT_AMBULATORY_CARE_PROVIDER_SITE_OTHER): Payer: Commercial Managed Care - HMO | Admitting: Cardiovascular Disease

## 2015-01-19 VITALS — BP 140/82 | HR 83 | Ht 63.0 in | Wt 219.0 lb

## 2015-01-19 DIAGNOSIS — I498 Other specified cardiac arrhythmias: Secondary | ICD-10-CM

## 2015-01-19 DIAGNOSIS — I499 Cardiac arrhythmia, unspecified: Secondary | ICD-10-CM | POA: Diagnosis not present

## 2015-01-19 DIAGNOSIS — I5022 Chronic systolic (congestive) heart failure: Secondary | ICD-10-CM | POA: Diagnosis not present

## 2015-01-19 DIAGNOSIS — E785 Hyperlipidemia, unspecified: Secondary | ICD-10-CM

## 2015-01-19 DIAGNOSIS — I10 Essential (primary) hypertension: Secondary | ICD-10-CM

## 2015-01-19 DIAGNOSIS — I050 Rheumatic mitral stenosis: Secondary | ICD-10-CM

## 2015-01-19 DIAGNOSIS — Z950 Presence of cardiac pacemaker: Secondary | ICD-10-CM

## 2015-01-19 DIAGNOSIS — I429 Cardiomyopathy, unspecified: Secondary | ICD-10-CM

## 2015-01-19 DIAGNOSIS — I442 Atrioventricular block, complete: Secondary | ICD-10-CM | POA: Diagnosis not present

## 2015-01-19 NOTE — Patient Instructions (Signed)
Continue all current medications. Your physician wants you to follow up in: 6 months.  You will receive a reminder letter in the mail one-two months in advance.  If you don't receive a letter, please call our office to schedule the follow up appointment   

## 2015-01-19 NOTE — Progress Notes (Signed)
Patient ID: [name], female   DOB: 1941-05-29, 74 y.o.   MRN: [id number]      SUBJECTIVE: The patient is a 74 year old woman who has a history of hypertension, hyperlipidemia, breast cancer, insulin-dependent diabetes mellitus, and a cardiomyopathy. She was hospitalized in 2015 for a syncopal episode and was found to be in complete heart block. An echocardiogram at that time revealed an EF of 35-40%. She ultimately underwent placement of a BiV pacemaker. She also had moderate mitral stenosis with a mean gradient of 7 mmHg.  A repeat echocardiogram on 06/04/2014 demonstrated normalization of left ventricular systolic function, EF 0000000, grade 1 diastolic dysfunction, elevated filling pressures, moderate left atrial dilatation, and mild mitral stenosis with mean gradient of 3 mm mercury.  The patient denies any symptoms of chest pain, palpitations, shortness of breath, lightheadedness, dizziness, leg swelling, orthopnea, PND, and syncope.  She is actively involved with her church and Bible study.   Review of Systems: As per "subjective", otherwise negative.  No Known Allergies  Current Outpatient Prescriptions  Medication Sig Dispense Refill  . acetaminophen (TYLENOL) 500 MG tablet Take 1,000 mg by mouth at bedtime.    Marland Kitchen aspirin EC 81 MG tablet Take 81 mg by mouth daily.      . carvedilol (COREG) 6.25 MG tablet Take 0.5 tablets (3.125 mg total) by mouth 2 (two) times daily. 30 tablet 4  . Cholecalciferol (VITAMIN D) 2000 UNITS tablet Take 2,000 Units by mouth daily as needed (constipation).     . furosemide (LASIX) 40 MG tablet Take 20 mg by mouth daily.    Marland Kitchen glipiZIDE (GLUCOTROL XL) 5 MG 24 hr tablet Take 5 mg by mouth daily with breakfast.    . lisinopril (PRINIVIL,ZESTRIL) 20 MG tablet Take 20 mg by mouth daily.    . Omega-3 Fatty Acids (FISH OIL PO) Take 2 capsules by mouth daily.    . pravastatin (PRAVACHOL) 10 MG tablet Take 10 mg by mouth at bedtime.     Marland Kitchen VICTOZA 18  MG/3ML SOPN Take 1.2 mg by mouth daily.     No current facility-administered medications for this visit.    Past Medical History  Diagnosis Date  . Hypertension   . Hypercholesterolemia   . Diabetes mellitus     with neuropathy  . Cancer 2008    right breast  . Cardiomyopathy     dating back to 2010  . CHF (congestive heart failure) pt stated she was recently diagnosed in another hospital with heart failure and a "weak heart"    Past Surgical History  Procedure Laterality Date  . Mastectomy  2008     mmh-right  . Knee arthroscopy      right-mmh  . Cholecystectomy      mmh  . Appendectomy      mmh  . Cataract extraction w/phaco  06/29/2011    Procedure: CATARACT EXTRACTION PHACO AND INTRAOCULAR LENS PLACEMENT (IOC);  Surgeon: Tonny Branch;  Location: [facility];  Service: Ophthalmology;  Laterality: Right;  CDE: 14.41  . Cataract extraction w/phaco  09/04/2011    Procedure: CATARACT EXTRACTION PHACO AND INTRAOCULAR LENS PLACEMENT (IOC);  Surgeon: Tonny Branch;  Location: [facility];  Service: Ophthalmology;  Laterality: Left;  CDE: 15.21  . Cardiomyopathy    . Temporary pacemaker insertion N/A 10/13/2013    Procedure: TEMPORARY PACEMAKER INSERTION;  Surgeon: Jolaine Artist, MD;  Location: [facility] CATH LAB;  Service: Cardiovascular;  Laterality: N/A;  . Bi-ventricular pacemaker insertion N/A 10/15/2013  Procedure: BI-VENTRICULAR PACEMAKER INSERTION (CRT-P);  Surgeon: Evans Lance, MD;  Location: [facility] CATH LAB;  Service: Cardiovascular;  Laterality: N/A;    History   Social History  . Marital Status: Widowed    Spouse Name: N/A  . Number of Children: N/A  . Years of Education: N/A   Occupational History  . Not on file.   Social History Main Topics  . Smoking status: Never Smoker   . Smokeless tobacco: Never Used  . Alcohol Use: No  . Drug Use: No  . Sexual Activity: Yes    Birth Control/ Protection: Post-menopausal   Other Topics Concern  . Not on file   Social History  Narrative     Filed Vitals:   01/19/15 0953  BP: 140/82  Pulse: 83  Height: 5\' 3"  (1.6 m)  Weight: 219 lb (99.338 kg)  SpO2: 98%    PHYSICAL EXAM General: NAD HEENT: Normal. Neck: No JVD, no thyromegaly. Lungs: Clear to auscultation bilaterally with normal respiratory effort. CV: Nondisplaced PMI. Regular rate and rhythm, normal S1/S2, no XX123456, I/VI systolic murmur along left sternal border. No pretibial or periankle edema. No carotid bruit. Normal pedal pulses.  Abdomen: Soft, obese, no distention.  Neurologic: Alert and oriented x 3.  Psych: Normal affect. Skin: Normal. Musculoskeletal: Normal range of motion, no gross deformities. Extremities: No clubbing or cyanosis.   ECG: Most recent ECG reviewed.      ASSESSMENT AND PLAN: 1. S/p BiV PPM for complete heart block and cardiomyopathy with pronounced paradoxical septal motion: Stable with normal device function on 10/23/14, with 1 mode switch x 6 seconds and >99% bi-V pacing. No high ventricular rates. Will need to make sure there aren't recurrences of atrial fibrillation/flutter (higher propensity for this given mild mitral stenosis), as she would then require anticoagulation. Follows with Dr. Lovena Le.   2. Cardiomyopathy: Normalization of LV systolic function by echo on 06/04/14. Currently on Coreg and lisinopril. She received chemotherapy for breast cancer in 2010 and I wonder if this was anthracycline-induced. Her BiV pacemaker is functioning appropriately. She is euvolemic and compensated. Will continue Lasix.  3. Essential HTN: Borderline elevated on present therapy which includes beta blocker and lisinopril. No changes as lisinopril recently increased to 20 mg by PCP.  4. Mild mitral stenosis: No signs of heart failure at present. Mean gradient 3 mmHg. Continue current therapy. Monitor for atrial arrhythmias.   Dispo: f/u in 6 months.   Kate Sable, M.D., F.A.C.C.

## 2015-01-25 ENCOUNTER — Encounter: Payer: Self-pay | Admitting: Internal Medicine

## 2015-01-25 ENCOUNTER — Ambulatory Visit (INDEPENDENT_AMBULATORY_CARE_PROVIDER_SITE_OTHER): Payer: Commercial Managed Care - HMO | Admitting: *Deleted

## 2015-01-25 DIAGNOSIS — I442 Atrioventricular block, complete: Secondary | ICD-10-CM

## 2015-01-25 NOTE — Progress Notes (Signed)
Remote pacemaker transmission.   

## 2015-01-29 LAB — CUP PACEART REMOTE DEVICE CHECK
Battery Remaining Longevity: 72 mo
Battery Remaining Percentage: 85 %
Brady Statistic AP VP Percent: 1 %
Brady Statistic AP VS Percent: 1 %
Brady Statistic AS VS Percent: 1 %
Brady Statistic RA Percent Paced: 1 %
Lead Channel Impedance Value: 410 Ohm
Lead Channel Impedance Value: 450 Ohm
Lead Channel Pacing Threshold Amplitude: 0.5 V
Lead Channel Pacing Threshold Pulse Width: 0.5 ms
Lead Channel Pacing Threshold Pulse Width: 0.5 ms
Lead Channel Sensing Intrinsic Amplitude: 12 mV
Lead Channel Sensing Intrinsic Amplitude: 5 mV
Lead Channel Setting Pacing Amplitude: 2 V
Lead Channel Setting Pacing Amplitude: 2.5 V
Lead Channel Setting Pacing Pulse Width: 0.5 ms
Lead Channel Setting Sensing Sensitivity: 2 mV
MDC IDC MSMT BATTERY VOLTAGE: 2.98 V
MDC IDC MSMT LEADCHNL LV PACING THRESHOLD AMPLITUDE: 1.5 V
MDC IDC MSMT LEADCHNL RV IMPEDANCE VALUE: 560 Ohm
MDC IDC MSMT LEADCHNL RV PACING THRESHOLD AMPLITUDE: 0.75 V
MDC IDC MSMT LEADCHNL RV PACING THRESHOLD PULSEWIDTH: 0.5 ms
MDC IDC SESS DTM: 20160502125805
MDC IDC SET LEADCHNL RV PACING AMPLITUDE: 2.5 V
MDC IDC SET LEADCHNL RV PACING PULSEWIDTH: 0.5 ms
MDC IDC STAT BRADY AS VP PERCENT: 99 %
MDC IDC STAT BRADY RV PERCENT PACED: 99 % — AB
Pulse Gen Model: 3242
Pulse Gen Serial Number: 7557114

## 2015-02-04 ENCOUNTER — Encounter: Payer: Self-pay | Admitting: Cardiology

## 2015-04-20 DIAGNOSIS — E109 Type 1 diabetes mellitus without complications: Secondary | ICD-10-CM | POA: Diagnosis not present

## 2015-04-20 DIAGNOSIS — E784 Other hyperlipidemia: Secondary | ICD-10-CM | POA: Diagnosis not present

## 2015-04-20 DIAGNOSIS — I10 Essential (primary) hypertension: Secondary | ICD-10-CM | POA: Diagnosis not present

## 2015-04-20 DIAGNOSIS — Z0100 Encounter for examination of eyes and vision without abnormal findings: Secondary | ICD-10-CM | POA: Diagnosis not present

## 2015-04-27 ENCOUNTER — Encounter: Payer: Self-pay | Admitting: Internal Medicine

## 2015-04-27 ENCOUNTER — Telehealth: Payer: Self-pay | Admitting: Cardiology

## 2015-04-27 ENCOUNTER — Ambulatory Visit (INDEPENDENT_AMBULATORY_CARE_PROVIDER_SITE_OTHER): Payer: Commercial Managed Care - HMO | Admitting: *Deleted

## 2015-04-27 DIAGNOSIS — I442 Atrioventricular block, complete: Secondary | ICD-10-CM | POA: Diagnosis not present

## 2015-04-27 NOTE — Telephone Encounter (Signed)
Spoke with pt and reminded pt of remote transmission that is due today. Pt verbalized understanding.   

## 2015-04-28 NOTE — Progress Notes (Signed)
Remote pacemaker transmission.   

## 2015-05-04 DIAGNOSIS — E1121 Type 2 diabetes mellitus with diabetic nephropathy: Secondary | ICD-10-CM | POA: Diagnosis not present

## 2015-05-04 DIAGNOSIS — E780 Pure hypercholesterolemia: Secondary | ICD-10-CM | POA: Diagnosis not present

## 2015-05-04 DIAGNOSIS — M25562 Pain in left knee: Secondary | ICD-10-CM | POA: Diagnosis not present

## 2015-05-04 DIAGNOSIS — N183 Chronic kidney disease, stage 3 (moderate): Secondary | ICD-10-CM | POA: Diagnosis not present

## 2015-05-04 DIAGNOSIS — I10 Essential (primary) hypertension: Secondary | ICD-10-CM | POA: Diagnosis not present

## 2015-05-08 LAB — CUP PACEART REMOTE DEVICE CHECK
Battery Remaining Longevity: 82 mo
Battery Voltage: 2.99 V
Brady Statistic AP VP Percent: 1.8 %
Brady Statistic AP VS Percent: 1 %
Brady Statistic AS VP Percent: 98 %
Brady Statistic RA Percent Paced: 1.8 %
Date Time Interrogation Session: 20160802183448
Lead Channel Impedance Value: 400 Ohm
Lead Channel Impedance Value: 430 Ohm
Lead Channel Impedance Value: 490 Ohm
Lead Channel Pacing Threshold Amplitude: 0.5 V
Lead Channel Pacing Threshold Amplitude: 0.75 V
Lead Channel Pacing Threshold Amplitude: 1.5 V
Lead Channel Pacing Threshold Pulse Width: 0.5 ms
Lead Channel Pacing Threshold Pulse Width: 0.5 ms
Lead Channel Sensing Intrinsic Amplitude: 5 mV
Lead Channel Setting Pacing Amplitude: 2 V
Lead Channel Setting Pacing Amplitude: 2.5 V
Lead Channel Setting Pacing Pulse Width: 0.5 ms
Lead Channel Setting Sensing Sensitivity: 2 mV
MDC IDC MSMT BATTERY REMAINING PERCENTAGE: 95.5 %
MDC IDC MSMT LEADCHNL RA PACING THRESHOLD PULSEWIDTH: 0.5 ms
MDC IDC MSMT LEADCHNL RV SENSING INTR AMPL: 12 mV
MDC IDC PG SERIAL: 7557114
MDC IDC SET LEADCHNL LV PACING AMPLITUDE: 2.5 V
MDC IDC SET LEADCHNL RV PACING PULSEWIDTH: 0.5 ms
MDC IDC STAT BRADY AS VS PERCENT: 1 %
Pulse Gen Model: 3242

## 2015-05-11 DIAGNOSIS — I10 Essential (primary) hypertension: Secondary | ICD-10-CM | POA: Diagnosis not present

## 2015-05-11 DIAGNOSIS — R808 Other proteinuria: Secondary | ICD-10-CM | POA: Diagnosis not present

## 2015-05-11 DIAGNOSIS — E780 Pure hypercholesterolemia: Secondary | ICD-10-CM | POA: Diagnosis not present

## 2015-05-11 DIAGNOSIS — N183 Chronic kidney disease, stage 3 (moderate): Secondary | ICD-10-CM | POA: Diagnosis not present

## 2015-05-11 DIAGNOSIS — M1712 Unilateral primary osteoarthritis, left knee: Secondary | ICD-10-CM | POA: Diagnosis not present

## 2015-05-11 DIAGNOSIS — E1121 Type 2 diabetes mellitus with diabetic nephropathy: Secondary | ICD-10-CM | POA: Diagnosis not present

## 2015-05-27 ENCOUNTER — Encounter: Payer: Self-pay | Admitting: Cardiology

## 2015-05-28 DIAGNOSIS — M1712 Unilateral primary osteoarthritis, left knee: Secondary | ICD-10-CM | POA: Diagnosis not present

## 2015-05-28 DIAGNOSIS — Z23 Encounter for immunization: Secondary | ICD-10-CM | POA: Diagnosis not present

## 2015-07-06 ENCOUNTER — Ambulatory Visit (INDEPENDENT_AMBULATORY_CARE_PROVIDER_SITE_OTHER): Payer: Commercial Managed Care - HMO | Admitting: Cardiovascular Disease

## 2015-07-06 ENCOUNTER — Encounter: Payer: Self-pay | Admitting: Cardiovascular Disease

## 2015-07-06 VITALS — BP 158/80 | HR 63 | Ht 63.0 in | Wt 221.0 lb

## 2015-07-06 DIAGNOSIS — E785 Hyperlipidemia, unspecified: Secondary | ICD-10-CM | POA: Diagnosis not present

## 2015-07-06 DIAGNOSIS — I442 Atrioventricular block, complete: Secondary | ICD-10-CM | POA: Diagnosis not present

## 2015-07-06 DIAGNOSIS — I499 Cardiac arrhythmia, unspecified: Secondary | ICD-10-CM

## 2015-07-06 DIAGNOSIS — I429 Cardiomyopathy, unspecified: Secondary | ICD-10-CM | POA: Diagnosis not present

## 2015-07-06 DIAGNOSIS — I498 Other specified cardiac arrhythmias: Secondary | ICD-10-CM

## 2015-07-06 DIAGNOSIS — Z950 Presence of cardiac pacemaker: Secondary | ICD-10-CM

## 2015-07-06 DIAGNOSIS — I342 Nonrheumatic mitral (valve) stenosis: Secondary | ICD-10-CM

## 2015-07-06 DIAGNOSIS — I10 Essential (primary) hypertension: Secondary | ICD-10-CM

## 2015-07-06 MED ORDER — AZILSARTAN MEDOXOMIL 80 MG PO TABS: 1.0000 | ORAL_TABLET | Freq: Every day | ORAL | Status: DC

## 2015-07-06 NOTE — Patient Instructions (Signed)
   Stop Lisinopril   Begin Edarbi 80mg  daily - printed script given today with instructions to fill at [facility]. Continue all other medications.   Change your Lasix to as needed for leg swelling or worsening shortness of breath. Continue all other medications.   Your physician wants you to follow up in: 6 months.  You will receive a reminder letter in the mail one-two months in advance.  If you don't receive a letter, please call our office to schedule the follow up appointment

## 2015-07-06 NOTE — Progress Notes (Signed)
Patient ID: Cassandra Parker, female   DOB: 1940/12/13, 74 y.o.   MRN: [id number]      SUBJECTIVE: The patient is a 74 year old woman who has a history of hypertension, hyperlipidemia, breast cancer, insulin-dependent diabetes mellitus, and a cardiomyopathy. She was hospitalized in 2015 for a syncopal episode and was found to be in complete heart block. An echocardiogram at that time revealed an EF of 35-40%. She ultimately underwent placement of a BiV pacemaker. She also had moderate mitral stenosis with a mean gradient of 7 mmHg.  A repeat echocardiogram on 06/04/2014 demonstrated normalization of left ventricular systolic function, EF 0000000, grade 1 diastolic dysfunction, elevated filling pressures, moderate left atrial dilatation, and mild mitral stenosis with mean gradient of 3 mm mercury.  The patient denies any symptoms of chest pain, palpitations, shortness of breath, lightheadedness, dizziness, leg swelling, orthopnea, PND, and syncope.  She fell and hurt her left knee over the summer, which is her only complaint.   Review of Systems: As per "subjective", otherwise negative.  No Known Allergies  Current Outpatient Prescriptions  Medication Sig Dispense Refill  . acetaminophen (TYLENOL) 500 MG tablet Take 1,000 mg by mouth at bedtime.    Marland Kitchen aspirin EC 81 MG tablet Take 81 mg by mouth daily.      . carvedilol (COREG) 6.25 MG tablet Take 0.5 tablets (3.125 mg total) by mouth 2 (two) times daily. 30 tablet 4  . Cholecalciferol (VITAMIN D) 2000 UNITS tablet Take 2,000 Units by mouth daily as needed (constipation).     . furosemide (LASIX) 40 MG tablet Take 20 mg by mouth daily.    Marland Kitchen glipiZIDE (GLUCOTROL XL) 5 MG 24 hr tablet Take 5 mg by mouth 2 (two) times daily.     Marland Kitchen lisinopril (PRINIVIL,ZESTRIL) 20 MG tablet Take 20 mg by mouth daily.    . nabumetone (RELAFEN) 500 MG tablet Take 500 mg by mouth 2 (two) times daily.    . Omega-3 Fatty Acids (FISH OIL PO) Take 2 capsules by mouth  daily.    . pravastatin (PRAVACHOL) 10 MG tablet Take 10 mg by mouth at bedtime.      No current facility-administered medications for this visit.    Past Medical History  Diagnosis Date  . Hypertension   . Hypercholesterolemia   . Diabetes mellitus     with neuropathy  . Cancer [facility]) 2008    right breast  . Cardiomyopathy ([place])     dating back to 2010  . CHF (congestive heart failure) ([place]) pt stated she was recently diagnosed in another hospital with heart failure and a "weak heart"    Past Surgical History  Procedure Laterality Date  . Mastectomy  2008     mmh-right  . Knee arthroscopy      right-mmh  . Cholecystectomy      mmh  . Appendectomy      mmh  . Cataract extraction w/phaco  06/29/2011    Procedure: CATARACT EXTRACTION PHACO AND INTRAOCULAR LENS PLACEMENT (IOC);  Surgeon: Tonny Branch;  Location: [facility];  Service: Ophthalmology;  Laterality: Right;  CDE: 14.41  . Cataract extraction w/phaco  09/04/2011    Procedure: CATARACT EXTRACTION PHACO AND INTRAOCULAR LENS PLACEMENT (IOC);  Surgeon: Tonny Branch;  Location: [facility];  Service: Ophthalmology;  Laterality: Left;  CDE: 15.21  . Cardiomyopathy    . Temporary pacemaker insertion N/A 10/13/2013    Procedure: TEMPORARY PACEMAKER INSERTION;  Surgeon: Jolaine Artist, MD;  Location: [facility] CATH LAB;  Service: Cardiovascular;  Laterality: N/A;  . Bi-ventricular pacemaker insertion N/A 10/15/2013    Procedure: BI-VENTRICULAR PACEMAKER INSERTION (CRT-P);  Surgeon: Evans Lance, MD;  Location: [facility] CATH LAB;  Service: Cardiovascular;  Laterality: N/A;    Social History   Social History  . Marital Status: Widowed    Spouse Name: N/A  . Number of Children: N/A  . Years of Education: N/A   Occupational History  . Not on file.   Social History Main Topics  . Smoking status: Never Smoker   . Smokeless tobacco: Never Used  . Alcohol Use: No  . Drug Use: No  . Sexual Activity: Yes    Birth Control/ Protection:  Post-menopausal   Other Topics Concern  . Not on file   Social History Narrative     Filed Vitals:   07/06/15 1034  BP: 158/80  Pulse: 63  Height: 5\' 3"  (1.6 m)  Weight: 221 lb (100.245 kg)  SpO2: 98%    PHYSICAL EXAM General: NAD HEENT: Normal. Neck: No JVD, no thyromegaly. Lungs: Clear to auscultation bilaterally with normal respiratory effort. CV: Nondisplaced PMI. Regular rate and rhythm, normal S1/S2, no [id number], I/VI systolic murmur along left sternal border. No pretibial or periankle edema.  Abdomen: Soft, obese, no distention.  Neurologic: Alert and oriented x 3.  Psych: Normal affect. Skin: Normal. Musculoskeletal: No gross deformities. Extremities: No clubbing or cyanosis.   ECG: Most recent ECG reviewed.      ASSESSMENT AND PLAN: 1. S/p BiV PPM for complete heart block and cardiomyopathy with pronounced paradoxical septal motion: Stable with normal device function on 04/27/15, with 3 mode switches and >99% bi-V pacing. One high ventricular rate. Will need to make sure there aren't recurrences of atrial fibrillation/flutter (higher propensity for this given mild mitral stenosis), as she would then require anticoagulation. Follows with Dr. Lovena Le.   2. Cardiomyopathy: Normalization of LV systolic function by echo on 06/04/14. Currently on Coreg and lisinopril, the latter of which I am switching to [facility] for more optimal BP control.  She received chemotherapy for breast cancer in 2010 and I wonder if this was anthracycline-induced. Her BiV pacemaker is functioning appropriately. She is euvolemic and compensated. Will switch Lasix to prn since LV systolic function has normalized.  3. Essential HTN: Elevated. Will switch lisinopril to Edarbi 80 mg once daily.  4. Mild mitral stenosis: No signs of heart failure at present. Mean gradient 3 mmHg. Continue current therapy. Monitor for atrial arrhythmias.   Dispo: f/u in 6 months.   Kate Sable, M.D.,  F.A.C.C.

## 2015-07-21 DIAGNOSIS — N189 Chronic kidney disease, unspecified: Secondary | ICD-10-CM | POA: Diagnosis not present

## 2015-07-21 DIAGNOSIS — C50911 Malignant neoplasm of unspecified site of right female breast: Secondary | ICD-10-CM | POA: Diagnosis not present

## 2015-07-21 DIAGNOSIS — D631 Anemia in chronic kidney disease: Secondary | ICD-10-CM | POA: Diagnosis not present

## 2015-07-21 DIAGNOSIS — N183 Chronic kidney disease, stage 3 (moderate): Secondary | ICD-10-CM | POA: Diagnosis not present

## 2015-07-28 ENCOUNTER — Ambulatory Visit (INDEPENDENT_AMBULATORY_CARE_PROVIDER_SITE_OTHER): Payer: Commercial Managed Care - HMO | Admitting: *Deleted

## 2015-07-28 DIAGNOSIS — I442 Atrioventricular block, complete: Secondary | ICD-10-CM | POA: Diagnosis not present

## 2015-07-28 DIAGNOSIS — N183 Chronic kidney disease, stage 3 (moderate): Secondary | ICD-10-CM | POA: Diagnosis not present

## 2015-07-28 DIAGNOSIS — I10 Essential (primary) hypertension: Secondary | ICD-10-CM | POA: Diagnosis not present

## 2015-07-28 DIAGNOSIS — M1712 Unilateral primary osteoarthritis, left knee: Secondary | ICD-10-CM | POA: Diagnosis not present

## 2015-07-29 NOTE — Progress Notes (Signed)
Remote pacemaker transmission.   

## 2015-08-12 LAB — CUP PACEART REMOTE DEVICE CHECK
Battery Remaining Longevity: 85 mo
Battery Remaining Percentage: 95.5 %
Battery Voltage: 2.98 V
Brady Statistic AP VP Percent: 7.3 %
Brady Statistic AP VS Percent: 1 %
Brady Statistic AS VS Percent: 1 %
Brady Statistic RA Percent Paced: 7.1 %
Implantable Lead Implant Date: 20150121
Implantable Lead Implant Date: 20150121
Implantable Lead Implant Date: 20150121
Implantable Lead Location: 753858
Implantable Lead Location: 753860
Lead Channel Impedance Value: 440 Ohm
Lead Channel Impedance Value: 440 Ohm
Lead Channel Setting Pacing Amplitude: 2 V
Lead Channel Setting Pacing Amplitude: 2.5 V
Lead Channel Setting Pacing Amplitude: 2.5 V
Lead Channel Setting Sensing Sensitivity: 2 mV
MDC IDC LEAD LOCATION: 753859
MDC IDC MSMT LEADCHNL RA SENSING INTR AMPL: 5 mV
MDC IDC MSMT LEADCHNL RV IMPEDANCE VALUE: 550 Ohm
MDC IDC SESS DTM: 20161101122837
MDC IDC SET LEADCHNL LV PACING PULSEWIDTH: 0.5 ms
MDC IDC SET LEADCHNL RV PACING PULSEWIDTH: 0.5 ms
MDC IDC STAT BRADY AS VP PERCENT: 93 %
Pulse Gen Model: 3242
Pulse Gen Serial Number: 7557114

## 2015-08-13 ENCOUNTER — Encounter: Payer: Self-pay | Admitting: Cardiology

## 2015-08-13 DIAGNOSIS — E119 Type 2 diabetes mellitus without complications: Secondary | ICD-10-CM | POA: Diagnosis not present

## 2015-08-13 DIAGNOSIS — H35372 Puckering of macula, left eye: Secondary | ICD-10-CM | POA: Diagnosis not present

## 2015-08-13 DIAGNOSIS — I10 Essential (primary) hypertension: Secondary | ICD-10-CM | POA: Diagnosis not present

## 2015-09-07 DIAGNOSIS — N183 Chronic kidney disease, stage 3 (moderate): Secondary | ICD-10-CM | POA: Diagnosis not present

## 2015-09-07 DIAGNOSIS — E7800 Pure hypercholesterolemia, unspecified: Secondary | ICD-10-CM | POA: Diagnosis not present

## 2015-09-07 DIAGNOSIS — E1121 Type 2 diabetes mellitus with diabetic nephropathy: Secondary | ICD-10-CM | POA: Diagnosis not present

## 2015-09-07 DIAGNOSIS — I10 Essential (primary) hypertension: Secondary | ICD-10-CM | POA: Diagnosis not present

## 2015-09-13 DIAGNOSIS — I10 Essential (primary) hypertension: Secondary | ICD-10-CM | POA: Diagnosis not present

## 2015-09-13 DIAGNOSIS — E1165 Type 2 diabetes mellitus with hyperglycemia: Secondary | ICD-10-CM | POA: Diagnosis not present

## 2015-09-13 DIAGNOSIS — E1121 Type 2 diabetes mellitus with diabetic nephropathy: Secondary | ICD-10-CM | POA: Diagnosis not present

## 2015-09-13 DIAGNOSIS — Z1389 Encounter for screening for other disorder: Secondary | ICD-10-CM | POA: Diagnosis not present

## 2015-09-13 DIAGNOSIS — R808 Other proteinuria: Secondary | ICD-10-CM | POA: Diagnosis not present

## 2015-09-13 DIAGNOSIS — N183 Chronic kidney disease, stage 3 (moderate): Secondary | ICD-10-CM | POA: Diagnosis not present

## 2015-09-13 DIAGNOSIS — M1712 Unilateral primary osteoarthritis, left knee: Secondary | ICD-10-CM | POA: Diagnosis not present

## 2015-09-29 DIAGNOSIS — M1712 Unilateral primary osteoarthritis, left knee: Secondary | ICD-10-CM | POA: Diagnosis not present

## 2015-10-05 ENCOUNTER — Telehealth: Payer: Self-pay | Admitting: *Deleted

## 2015-10-05 NOTE — Telephone Encounter (Signed)
Noted, will fax back to [facility] today.

## 2015-10-05 NOTE — Telephone Encounter (Signed)
Can proceed with surgery, and hold ASA 3 days prior if need be.

## 2015-10-05 NOTE — Telephone Encounter (Signed)
Received fax from [place] requesting clearance of left knee:  TKA-medial & Lateral w/ wo patella resurfacing.  Patient on ASA only.  Last seen 07/06/2015.

## 2015-10-08 ENCOUNTER — Ambulatory Visit: Payer: Self-pay | Admitting: Orthopedic Surgery

## 2015-10-21 NOTE — Progress Notes (Signed)
Pre op appointment made for Friday at patients request so that daughter may come with her

## 2015-10-28 ENCOUNTER — Ambulatory Visit: Payer: Self-pay | Admitting: Orthopedic Surgery

## 2015-10-28 NOTE — H&P (Signed)
TOTAL KNEE ADMISSION H&P  Patient is being admitted for left total knee arthroplasty.  Subjective:  Chief Complaint:left knee pain.  HPI: Cassandra Parker, 75 y.o. female, has a history of pain and functional disability in the left knee due to arthritis and has failed non-surgical conservative treatments for greater than 12 weeks to includeNSAID's and/or analgesics, corticosteriod injections, flexibility and strengthening excercises, use of assistive devices, weight reduction as appropriate and activity modification.  Onset of symptoms was gradual, starting >10 years ago with gradually worsening course since that time. The patient noted no past surgery on the left knee(s).  Patient currently rates pain in the left knee(s) at 10 out of 10 with activity. Patient has night pain, worsening of pain with activity and weight bearing, pain that interferes with activities of daily living, pain with passive range of motion, crepitus and joint swelling.  Patient has evidence of subchondral cysts, subchondral sclerosis, periarticular osteophytes, joint subluxation and joint space narrowing by imaging studies.  There is no active infection.  Patient Active Problem List   Diagnosis Date Noted  . Pacemaker 10/23/2014  . Acute renal insufficiency 10/15/2013  . Mitral stenosis- moderate with trivial MR  10/15/2013  . CHB (complete heart block) ([place]) 10/13/2013  . Syncope 10/13/2013  . HTN (hypertension) 10/13/2013  . Diabetes mellitus type 2, controlled, without complications ([place]) 123456  . Cancer of breast- s/p chemo and Rt mastectomy 2008 10/13/2013  . Cardiomyopathy- EF 35-40% (etiology not yet determined) 10/13/2013  . Dyslipidemia 10/13/2013   Past Medical History  Diagnosis Date  . Hypertension   . Hypercholesterolemia   . Diabetes mellitus     with neuropathy  . Cancer [facility]) 2008    right breast  . Cardiomyopathy ([place])     dating back to 2010  . CHF (congestive heart failure) ([place]) pt  stated she was recently diagnosed in another hospital with heart failure and a "weak heart"    Past Surgical History  Procedure Laterality Date  . Mastectomy  2008     mmh-right  . Knee arthroscopy      right-mmh  . Cholecystectomy      mmh  . Appendectomy      mmh  . Cataract extraction w/phaco  06/29/2011    Procedure: CATARACT EXTRACTION PHACO AND INTRAOCULAR LENS PLACEMENT (IOC);  Surgeon: Tonny Branch;  Location: [facility];  Service: Ophthalmology;  Laterality: Right;  CDE: 14.41  . Cataract extraction w/phaco  09/04/2011    Procedure: CATARACT EXTRACTION PHACO AND INTRAOCULAR LENS PLACEMENT (IOC);  Surgeon: Tonny Branch;  Location: [facility];  Service: Ophthalmology;  Laterality: Left;  CDE: 15.21  . Cardiomyopathy    . Temporary pacemaker insertion N/A 10/13/2013    Procedure: TEMPORARY PACEMAKER INSERTION;  Surgeon: Jolaine Artist, MD;  Location: [facility] CATH LAB;  Service: Cardiovascular;  Laterality: N/A;  . Bi-ventricular pacemaker insertion N/A 10/15/2013    Procedure: BI-VENTRICULAR PACEMAKER INSERTION (CRT-P);  Surgeon: Evans Lance, MD;  Location: [facility] CATH LAB;  Service: Cardiovascular;  Laterality: N/A;     (Not in a hospital admission) No Known Allergies  Social History  Substance Use Topics  . Smoking status: Never Smoker   . Smokeless tobacco: Never Used  . Alcohol Use: No    Family History  Problem Relation Age of Onset  . Anesthesia problems Neg Hx   . Hypotension Neg Hx   . Malignant hyperthermia Neg Hx   . Pseudochol deficiency Neg Hx      Review of  Systems  Constitutional: Negative.   HENT: Negative.   Eyes: Negative.   Respiratory: Negative.   Cardiovascular: Negative.   Gastrointestinal: Negative.   Genitourinary: Negative.   Musculoskeletal: Positive for joint pain.  Skin: Negative.   Neurological: Negative.   Psychiatric/Behavioral: Negative.     Objective:  Physical Exam  Constitutional: She is oriented to person, place, and time. She appears  well-developed and well-nourished.  HENT:  Head: Normocephalic and atraumatic.  Eyes: Conjunctivae and EOM are normal. Pupils are equal, round, and reactive to light.  Neck: Normal range of motion. Neck supple.  Cardiovascular: Normal rate and regular rhythm.   Respiratory: Effort normal and breath sounds normal. No respiratory distress.  GI: Soft. Bowel sounds are normal.  Genitourinary:  deferred  Musculoskeletal:       Left knee: She exhibits decreased range of motion, swelling and deformity.  0-105 degrees Pulses not palpable ABI > 1  Neurological: She is alert and oriented to person, place, and time. She has normal reflexes.  Skin: Skin is warm and dry.  Psychiatric: She has a normal mood and affect. Her behavior is normal. Judgment and thought content normal.    Vital signs in last 24 hours: @VSRANGES @  Labs:   Estimated body mass index is 39.16 kg/(m^2) as calculated from the following:   Height as of 07/06/15: 5\' 3"  (1.6 m).   Weight as of 07/06/15: 100.245 kg (221 lb).   Imaging Review Plain radiographs demonstrate severe degenerative joint disease of the left knee(s). The overall alignment issignificant valgus. The bone quality appears to be adequate for age and reported activity level.  Assessment/Plan:  End stage arthritis, left knee   The patient history, physical examination, clinical judgment of the provider and imaging studies are consistent with end stage degenerative joint disease of the left knee(s) and total knee arthroplasty is deemed medically necessary. The treatment options including medical management, injection therapy arthroscopy and arthroplasty were discussed at length. The risks and benefits of total knee arthroplasty were presented and reviewed. The risks due to aseptic loosening, infection, stiffness, patella tracking problems, thromboembolic complications and other imponderables were discussed. The patient acknowledged the explanation, agreed to  proceed with the plan and consent was signed. Patient is being admitted for inpatient treatment for surgery, pain control, PT, OT, prophylactic antibiotics, VTE prophylaxis, progressive ambulation and ADL's and discharge planning. The patient is planning to be discharged home with home health services

## 2015-10-29 ENCOUNTER — Encounter (HOSPITAL_COMMUNITY)
Admission: RE | Admit: 2015-10-29 | Discharge: 2015-10-29 | Disposition: A | Discharge status: Home / Self Care | Payer: Commercial Managed Care - HMO | Source: Ambulatory Visit | Attending: Orthopedic Surgery | Admitting: Orthopedic Surgery

## 2015-10-29 ENCOUNTER — Encounter (HOSPITAL_COMMUNITY): Payer: Self-pay

## 2015-10-29 ENCOUNTER — Other Ambulatory Visit: Payer: Self-pay

## 2015-10-29 DIAGNOSIS — Z01812 Encounter for preprocedural laboratory examination: Secondary | ICD-10-CM | POA: Insufficient documentation

## 2015-10-29 DIAGNOSIS — Z0183 Encounter for blood typing: Secondary | ICD-10-CM | POA: Diagnosis not present

## 2015-10-29 DIAGNOSIS — Z01818 Encounter for other preprocedural examination: Secondary | ICD-10-CM | POA: Insufficient documentation

## 2015-10-29 DIAGNOSIS — M1712 Unilateral primary osteoarthritis, left knee: Secondary | ICD-10-CM | POA: Diagnosis not present

## 2015-10-29 DIAGNOSIS — I10 Essential (primary) hypertension: Secondary | ICD-10-CM | POA: Insufficient documentation

## 2015-10-29 HISTORY — DX: Presence of cardiac pacemaker: Z95.0

## 2015-10-29 HISTORY — DX: Personal history of other medical treatment: Z92.89

## 2015-10-29 HISTORY — DX: Paresthesia of skin: R20.2

## 2015-10-29 HISTORY — DX: Unspecified osteoarthritis, unspecified site: M19.90

## 2015-10-29 LAB — COMPREHENSIVE METABOLIC PANEL
ALBUMIN: 4 g/dL (ref 3.5–5.0)
ALK PHOS: 72 U/L (ref 38–126)
ALT: 15 U/L (ref 14–54)
AST: 18 U/L (ref 15–41)
Anion gap: 11 (ref 5–15)
BILIRUBIN TOTAL: 0.2 mg/dL — AB (ref 0.3–1.2)
BUN: 53 mg/dL — AB (ref 6–20)
CALCIUM: 9.6 mg/dL (ref 8.9–10.3)
CO2: 23 mmol/L (ref 22–32)
Chloride: 105 mmol/L (ref 101–111)
Creatinine, Ser: 1.97 mg/dL — ABNORMAL HIGH (ref 0.44–1.00)
GFR calc Af Amer: 28 mL/min — ABNORMAL LOW (ref >60)
GFR calc non Af Amer: 24 mL/min — ABNORMAL LOW (ref >60)
GLUCOSE: 172 mg/dL — AB (ref 65–99)
Potassium: 4.2 mmol/L (ref 3.5–5.1)
Sodium: 139 mmol/L (ref 135–145)
TOTAL PROTEIN: 7.9 g/dL (ref 6.5–8.1)

## 2015-10-29 LAB — URINALYSIS, ROUTINE W REFLEX MICROSCOPIC
BILIRUBIN URINE: NEGATIVE
Glucose, UA: NEGATIVE mg/dL
Ketones, ur: NEGATIVE mg/dL
NITRITE: NEGATIVE
PROTEIN: 30 mg/dL — AB
SPECIFIC GRAVITY, URINE: 1.015 (ref 1.005–1.030)
pH: 5.5 (ref 5.0–8.0)

## 2015-10-29 LAB — CBC WITH DIFFERENTIAL/PLATELET
Basophils Absolute: 0 10*3/uL (ref 0.0–0.1)
Basophils Relative: 0 %
EOS ABS: 0.2 10*3/uL (ref 0.0–0.7)
EOS PCT: 2 %
HCT: 34.6 % — ABNORMAL LOW (ref 36.0–46.0)
HEMOGLOBIN: 11.1 g/dL — AB (ref 12.0–15.0)
LYMPHS ABS: 2.7 10*3/uL (ref 0.7–4.0)
Lymphocytes Relative: 29 %
MCH: 29.8 pg (ref 26.0–34.0)
MCHC: 32.1 g/dL (ref 30.0–36.0)
MCV: 93 fL (ref 78.0–100.0)
MONOS PCT: 6 %
Monocytes Absolute: 0.6 10*3/uL (ref 0.1–1.0)
Neutro Abs: 5.8 10*3/uL (ref 1.7–7.7)
Neutrophils Relative %: 63 %
Platelets: 289 10*3/uL (ref 150–400)
RBC: 3.72 MIL/uL — ABNORMAL LOW (ref 3.87–5.11)
RDW: 13.8 % (ref 11.5–15.5)
WBC: 9.2 10*3/uL (ref 4.0–10.5)

## 2015-10-29 LAB — SURGICAL PCR SCREEN
MRSA, PCR: POSITIVE — AB
Staphylococcus aureus: POSITIVE — AB

## 2015-10-29 LAB — URINE MICROSCOPIC-ADD ON

## 2015-10-29 LAB — PROTIME-INR
INR: 1.02 (ref 0.00–1.49)
Prothrombin Time: 13.6 seconds (ref 11.6–15.2)

## 2015-10-29 LAB — APTT: aPTT: 28 seconds (ref 24–37)

## 2015-10-29 LAB — ABO/RH: ABO/RH(D): A POS

## 2015-10-29 NOTE — Progress Notes (Addendum)
Pacemaker orders per chart  A1C results per chart 09/08/2015  Echo / EPIC 06/04/2014

## 2015-10-29 NOTE — Progress Notes (Signed)
Surgical screening results in epic per PAT visit 10/29/2015 positive for MRSA and STAPH. Results sent to Dr Lyla Glassing. Prescription for Mupuriocin Ointment called to [facility] spoke with Brooke/pharmacy tech. Pt aware.

## 2015-10-29 NOTE — Progress Notes (Addendum)
Cardiac clearance per epic per Dr Bronson Ing 10/05/2015  LOV note per epic per cardiology 07/06/2015

## 2015-10-29 NOTE — Patient Instructions (Signed)
Cassandra Parker  10/29/2015   Your procedure is scheduled on: Thursday November 04, 2015   Report to [facility]  Entrance take [place]  elevators to 3rd floor to  [place] at 10:45 AM.  Call this number if you have problems the morning of surgery [phone]   Remember: ONLY 1 PERSON MAY GO WITH YOU TO SHORT STAY TO GET  READY MORNING OF [place].  Do not eat food or drink liquids :After Midnight.     Take these medicines the morning of surgery with A SIP OF WATER: Carvedilol (Coreg)  DO NOT TAKE ANY DIABETIC MEDICATIONS DAY OF YOUR SURGERY                               You may not have any metal on your body including hair pins and              piercings  Do not wear jewelry, make-up, lotions, powders or perfumes, deodorant             Do not wear nail polish.  Do not shave  48 hours prior to surgery.              Do not bring valuables to the hospital. [place].  Contacts, dentures or bridgework may not be worn into surgery.  Leave suitcase in the car. After surgery it may be brought to your room.              Please read over the following fact sheets you were given:MRSA INFORMATION SHEET; INCENTIVE SPIROMETER; BLOOD TRANSFUSION INFORMATION SHEET  _____________________________________________________________________             [facility] - Preparing for Surgery Before surgery, you can play an important role.  Because skin is not sterile, your skin needs to be as free of germs as possible.  You can reduce the number of germs on your skin by washing with CHG (chlorahexidine gluconate) soap before surgery.  CHG is an antiseptic cleaner which kills germs and bonds with the skin to continue killing germs even after washing. Please DO NOT use if you have an allergy to CHG or antibacterial soaps.  If your skin becomes reddened/irritated stop using the CHG and inform your nurse when you arrive at  Short Stay. Do not shave (including legs and underarms) for at least 48 hours prior to the first CHG shower.  You may shave your face/neck. Please follow these instructions carefully:  1.  Shower with CHG Soap the night before surgery and the  morning of Surgery.  2.  If you choose to wash your hair, wash your hair first as usual with your  normal  shampoo.  3.  After you shampoo, rinse your hair and body thoroughly to remove the  shampoo.                           4.  Use CHG as you would any other liquid soap.  You can apply chg directly  to the skin and wash                       Gently with  a scrungie or clean washcloth.  5.  Apply the CHG Soap to your body ONLY FROM THE NECK DOWN.   Do not use on face/ open                           Wound or open sores. Avoid contact with eyes, ears mouth and genitals (private parts).                       Wash face,  Genitals (private parts) with your normal soap.             6.  Wash thoroughly, paying special attention to the area where your surgery  will be performed.  7.  Thoroughly rinse your body with warm water from the neck down.  8.  DO NOT shower/wash with your normal soap after using and rinsing off  the CHG Soap.                9.  Pat yourself dry with a clean towel.            10.  Wear clean pajamas.            11.  Place clean sheets on your bed the night of your first shower and do not  sleep with pets. Day of Surgery : Do not apply any lotions/deodorants the morning of surgery.  Please wear clean clothes to the hospital/surgery center.  FAILURE TO FOLLOW THESE INSTRUCTIONS MAY RESULT IN THE CANCELLATION OF YOUR SURGERY PATIENT SIGNATURE_________________________________  NURSE SIGNATURE__________________________________  ________________________________________________________________________   Adam Phenix  An incentive spirometer is a tool that can help keep your lungs clear and active. This tool measures how well you are  filling your lungs with each breath. Taking long deep breaths may help reverse or decrease the chance of developing breathing (pulmonary) problems (especially infection) following:  A long period of time when you are unable to move or be active. BEFORE THE PROCEDURE   If the spirometer includes an indicator to show your best effort, your nurse or respiratory therapist will set it to a desired goal.  If possible, sit up straight or lean slightly forward. Try not to slouch.  Hold the incentive spirometer in an upright position. INSTRUCTIONS FOR USE   Sit on the edge of your bed if possible, or sit up as far as you can in bed or on a chair.  Hold the incentive spirometer in an upright position.  Breathe out normally.  Place the mouthpiece in your mouth and seal your lips tightly around it.  Breathe in slowly and as deeply as possible, raising the piston or the ball toward the top of the column.  Hold your breath for 3-5 seconds or for as long as possible. Allow the piston or ball to fall to the bottom of the column.  Remove the mouthpiece from your mouth and breathe out normally.  Rest for a few seconds and repeat Steps 1 through 7 at least 10 times every 1-2 hours when you are awake. Take your time and take a few normal breaths between deep breaths.  The spirometer may include an indicator to show your best effort. Use the indicator as a goal to work toward during each repetition.  After each set of 10 deep breaths, practice coughing to be sure your lungs are clear. If you have an incision (the cut made at the time of surgery), support your  incision when coughing by placing a pillow or rolled up towels firmly against it. Once you are able to get out of bed, walk around indoors and cough well. You may stop using the incentive spirometer when instructed by your caregiver.  RISKS AND COMPLICATIONS  Take your time so you do not get dizzy or light-headed.  If you are in pain, you may  need to take or ask for pain medication before doing incentive spirometry. It is harder to take a deep breath if you are having pain. AFTER USE  Rest and breathe slowly and easily.  It can be helpful to keep track of a log of your progress. Your caregiver can provide you with a simple table to help with this. If you are using the spirometer at home, follow these instructions: [place] IF:   You are having difficultly using the spirometer.  You have trouble using the spirometer as often as instructed.  Your pain medication is not giving enough relief while using the spirometer.  You develop fever of 100.5 F (38.1 C) or higher. SEEK IMMEDIATE MEDICAL CARE IF:   You cough up bloody sputum that had not been present before.  You develop fever of 102 F (38.9 C) or greater.  You develop worsening pain at or near the incision site. MAKE SURE YOU:   Understand these instructions.  Will watch your condition.  Will get help right away if you are not doing well or get worse. Document Released: 01/22/2007 Document Revised: 12/04/2011 Document Reviewed: 03/25/2007 ExitCare Patient Information 2014 Exit[employer], [place].   ________________________________________________________________________  WHAT IS A BLOOD TRANSFUSION? Blood Transfusion Information  A transfusion is the replacement of blood or some of its parts. Blood is made up of multiple cells which provide different functions.  Red blood cells carry oxygen and are used for blood loss replacement.  White blood cells fight against infection.  Platelets control bleeding.  Plasma helps clot blood.  Other blood products are available for specialized needs, such as hemophilia or other clotting disorders. BEFORE THE TRANSFUSION  Who gives blood for transfusions?   Healthy volunteers who are fully evaluated to make sure their blood is safe. This is blood bank blood. Transfusion therapy is the safest it has ever been in  the practice of medicine. Before blood is taken from a donor, a complete history is taken to make sure that person has no history of diseases nor engages in risky social behavior (examples are intravenous drug use or sexual activity with multiple partners). The donor's travel history is screened to minimize risk of transmitting infections, such as malaria. The donated blood is tested for signs of infectious diseases, such as HIV and hepatitis. The blood is then tested to be sure it is compatible with you in order to minimize the chance of a transfusion reaction. If you or a relative donates blood, this is often done in anticipation of surgery and is not appropriate for emergency situations. It takes many days to process the donated blood. RISKS AND COMPLICATIONS Although transfusion therapy is very safe and saves many lives, the main dangers of transfusion include:   Getting an infectious disease.  Developing a transfusion reaction. This is an allergic reaction to something in the blood you were given. Every precaution is taken to prevent this. The decision to have a blood transfusion has been considered carefully by your caregiver before blood is given. Blood is not given unless the benefits outweigh the risks. AFTER THE TRANSFUSION  Right  after receiving a blood transfusion, you will usually feel much better and more energetic. This is especially true if your red blood cells have gotten low (anemic). The transfusion raises the level of the red blood cells which carry oxygen, and this usually causes an energy increase.  The nurse administering the transfusion will monitor you carefully for complications. HOME CARE INSTRUCTIONS  No special instructions are needed after a transfusion. You may find your energy is better. Speak with your caregiver about any limitations on activity for underlying diseases you may have. SEEK MEDICAL CARE IF:   Your condition is not improving after your  transfusion.  You develop redness or irritation at the intravenous (IV) site. SEEK IMMEDIATE MEDICAL CARE IF:  Any of the following symptoms occur over the next 12 hours:  Shaking chills.  You have a temperature by mouth above 102 F (38.9 C), not controlled by medicine.  Chest, back, or muscle pain.  People around you feel you are not acting correctly or are confused.  Shortness of breath or difficulty breathing.  Dizziness and fainting.  You get a rash or develop hives.  You have a decrease in urine output.  Your urine turns a dark color or changes to pink, red, or brown. Any of the following symptoms occur over the next 10 days:  You have a temperature by mouth above 102 F (38.9 C), not controlled by medicine.  Shortness of breath.  Weakness after normal activity.  The white part of the eye turns yellow (jaundice).  You have a decrease in the amount of urine or are urinating less often.  Your urine turns a dark color or changes to pink, red, or brown. Document Released: 09/08/2000 Document Revised: 12/04/2011 Document Reviewed: 04/27/2008 [facility] Patient Information 2014 [place], [place].  _______________________________________________________________________

## 2015-10-29 NOTE — Progress Notes (Addendum)
CMP, urinalysis and micro results in epic per PAT visit 10/29/2015 sent to Dr Lyla Glassing

## 2015-11-04 ENCOUNTER — Inpatient Hospital Stay (HOSPITAL_COMMUNITY): Payer: Commercial Managed Care - HMO

## 2015-11-04 ENCOUNTER — Encounter (HOSPITAL_COMMUNITY): Payer: Self-pay | Admitting: *Deleted

## 2015-11-04 ENCOUNTER — Encounter (HOSPITAL_COMMUNITY)
Admission: RE | Disposition: A | Discharge status: Home Health | Payer: Self-pay | Source: Ambulatory Visit | Attending: Orthopedic Surgery

## 2015-11-04 ENCOUNTER — Ambulatory Visit: Payer: Self-pay | Admitting: Orthopedic Surgery

## 2015-11-04 ENCOUNTER — Inpatient Hospital Stay (HOSPITAL_COMMUNITY): Payer: Commercial Managed Care - HMO | Admitting: Anesthesiology

## 2015-11-04 ENCOUNTER — Inpatient Hospital Stay (HOSPITAL_COMMUNITY)
Admission: RE | Admit: 2015-11-04 | Discharge: 2015-11-06 | DRG: 470 | Disposition: A | Discharge status: Home Health | Payer: Commercial Managed Care - HMO | Source: Ambulatory Visit | Attending: Orthopedic Surgery | Admitting: Orthopedic Surgery

## 2015-11-04 DIAGNOSIS — E7800 Pure hypercholesterolemia, unspecified: Secondary | ICD-10-CM | POA: Diagnosis present

## 2015-11-04 DIAGNOSIS — I429 Cardiomyopathy, unspecified: Secondary | ICD-10-CM | POA: Diagnosis not present

## 2015-11-04 DIAGNOSIS — Z6839 Body mass index (BMI) 39.0-39.9, adult: Secondary | ICD-10-CM | POA: Diagnosis not present

## 2015-11-04 DIAGNOSIS — I10 Essential (primary) hypertension: Secondary | ICD-10-CM | POA: Diagnosis not present

## 2015-11-04 DIAGNOSIS — Z96652 Presence of left artificial knee joint: Secondary | ICD-10-CM | POA: Diagnosis not present

## 2015-11-04 DIAGNOSIS — Z950 Presence of cardiac pacemaker: Secondary | ICD-10-CM | POA: Diagnosis not present

## 2015-11-04 DIAGNOSIS — Z471 Aftercare following joint replacement surgery: Secondary | ICD-10-CM | POA: Diagnosis not present

## 2015-11-04 DIAGNOSIS — M25562 Pain in left knee: Secondary | ICD-10-CM | POA: Diagnosis not present

## 2015-11-04 DIAGNOSIS — M1712 Unilateral primary osteoarthritis, left knee: Secondary | ICD-10-CM | POA: Diagnosis not present

## 2015-11-04 DIAGNOSIS — Z9010 Acquired absence of unspecified breast and nipple: Secondary | ICD-10-CM

## 2015-11-04 DIAGNOSIS — G8918 Other acute postprocedural pain: Secondary | ICD-10-CM | POA: Diagnosis not present

## 2015-11-04 DIAGNOSIS — M519 Unspecified thoracic, thoracolumbar and lumbosacral intervertebral disc disorder: Secondary | ICD-10-CM | POA: Diagnosis not present

## 2015-11-04 DIAGNOSIS — Z01812 Encounter for preprocedural laboratory examination: Secondary | ICD-10-CM

## 2015-11-04 DIAGNOSIS — M179 Osteoarthritis of knee, unspecified: Secondary | ICD-10-CM | POA: Diagnosis not present

## 2015-11-04 DIAGNOSIS — Z09 Encounter for follow-up examination after completed treatment for conditions other than malignant neoplasm: Secondary | ICD-10-CM

## 2015-11-04 DIAGNOSIS — Z7984 Long term (current) use of oral hypoglycemic drugs: Secondary | ICD-10-CM | POA: Diagnosis not present

## 2015-11-04 DIAGNOSIS — E119 Type 2 diabetes mellitus without complications: Secondary | ICD-10-CM | POA: Diagnosis not present

## 2015-11-04 HISTORY — DX: Unilateral primary osteoarthritis, left knee: M17.12

## 2015-11-04 HISTORY — PX: TOTAL KNEE ARTHROPLASTY: SHX125

## 2015-11-04 LAB — GLUCOSE, CAPILLARY
GLUCOSE-CAPILLARY: 127 mg/dL — AB (ref 65–99)
GLUCOSE-CAPILLARY: 298 mg/dL — AB (ref 65–99)
Glucose-Capillary: 172 mg/dL — ABNORMAL HIGH (ref 65–99)

## 2015-11-04 LAB — TYPE AND SCREEN
ABO/RH(D): A POS
Antibody Screen: NEGATIVE

## 2015-11-04 SURGERY — ARTHROPLASTY, KNEE, TOTAL
Anesthesia: Regional | Site: Knee | Laterality: Left

## 2015-11-04 MED ORDER — KETOROLAC TROMETHAMINE 30 MG/ML IJ SOLN
INTRAMUSCULAR | Status: DC | PRN
  Administered 2015-11-04: 30 mg via INTRAVENOUS

## 2015-11-04 MED ORDER — ROPIVACAINE HCL 5 MG/ML IJ SOLN
INTRAMUSCULAR | Status: AC
  Filled 2015-11-04: qty 30

## 2015-11-04 MED ORDER — DEXAMETHASONE SODIUM PHOSPHATE 10 MG/ML IJ SOLN
INTRAMUSCULAR | Status: AC
  Filled 2015-11-04: qty 1

## 2015-11-04 MED ORDER — PROPOFOL 10 MG/ML IV BOLUS
INTRAVENOUS | Status: DC | PRN
  Administered 2015-11-04: 140 mg via INTRAVENOUS

## 2015-11-04 MED ORDER — APIXABAN 2.5 MG PO TABS
2.5000 mg | ORAL_TABLET | Freq: Two times a day (BID) | ORAL | Status: DC
  Administered 2015-11-05 – 2015-11-06 (×3): 2.5 mg via ORAL
  Filled 2015-11-04 (×5): qty 1

## 2015-11-04 MED ORDER — LIDOCAINE HCL (CARDIAC) 20 MG/ML IV SOLN
INTRAVENOUS | Status: AC
  Filled 2015-11-04: qty 5

## 2015-11-04 MED ORDER — CHLORTHALIDONE 25 MG PO TABS
12.5000 mg | ORAL_TABLET | Freq: Every day | ORAL | Status: DC
  Administered 2015-11-04 – 2015-11-06 (×2): 12.5 mg via ORAL
  Filled 2015-11-04 (×3): qty 0.5

## 2015-11-04 MED ORDER — LIDOCAINE HCL (PF) 2 % IJ SOLN
INTRAMUSCULAR | Status: DC | PRN
  Administered 2015-11-04: 30 mg via INTRADERMAL

## 2015-11-04 MED ORDER — SODIUM CHLORIDE 0.9 % IR SOLN
Status: DC | PRN
  Administered 2015-11-04: 1000 mL

## 2015-11-04 MED ORDER — ACETAMINOPHEN 650 MG RE SUPP: 650.0000 mg | Freq: Four times a day (QID) | RECTAL | Status: DC | PRN

## 2015-11-04 MED ORDER — INSULIN ASPART 100 UNIT/ML ~~LOC~~ SOLN
0.0000 [IU] | Freq: Three times a day (TID) | SUBCUTANEOUS | Status: DC
  Administered 2015-11-05: 8 [IU] via SUBCUTANEOUS
  Administered 2015-11-05: 5 [IU] via SUBCUTANEOUS
  Administered 2015-11-05: 11 [IU] via SUBCUTANEOUS
  Administered 2015-11-06: 5 [IU] via SUBCUTANEOUS

## 2015-11-04 MED ORDER — KETOROLAC TROMETHAMINE 30 MG/ML IJ SOLN
INTRAMUSCULAR | Status: AC
  Filled 2015-11-04: qty 1

## 2015-11-04 MED ORDER — PROMETHAZINE HCL 25 MG/ML IJ SOLN
6.2500 mg | INTRAMUSCULAR | Status: DC | PRN
Start: 2015-11-04 — End: 2015-11-04

## 2015-11-04 MED ORDER — HYDROCODONE-ACETAMINOPHEN 5-325 MG PO TABS
1.0000 | ORAL_TABLET | ORAL | Status: DC | PRN
  Administered 2015-11-05: 2 via ORAL
  Administered 2015-11-05 (×2): 1 via ORAL
  Administered 2015-11-05 – 2015-11-06 (×4): 2 via ORAL
  Filled 2015-11-04 (×3): qty 2
  Filled 2015-11-04 (×4): qty 1
  Filled 2015-11-04: qty 2

## 2015-11-04 MED ORDER — SODIUM CHLORIDE 0.9 % IJ SOLN
INTRAMUSCULAR | Status: AC
  Filled 2015-11-04: qty 50

## 2015-11-04 MED ORDER — VANCOMYCIN HCL IN DEXTROSE 1-5 GM/200ML-% IV SOLN
INTRAVENOUS | Status: AC
  Filled 2015-11-04: qty 200

## 2015-11-04 MED ORDER — ISOPROPYL ALCOHOL 70 % SOLN
Status: AC
  Filled 2015-11-04: qty 480

## 2015-11-04 MED ORDER — HYDROCODONE-ACETAMINOPHEN 7.5-325 MG PO TABS: 1.0000 | ORAL_TABLET | Freq: Once | ORAL | Status: DC | PRN

## 2015-11-04 MED ORDER — POVIDONE-IODINE 10 % EX SOLN
CUTANEOUS | Status: DC | PRN
  Administered 2015-11-04: 1 via TOPICAL

## 2015-11-04 MED ORDER — HYDROMORPHONE HCL 1 MG/ML IJ SOLN
INTRAMUSCULAR | Status: AC
  Filled 2015-11-04: qty 1

## 2015-11-04 MED ORDER — MENTHOL 3 MG MT LOZG: 1.0000 | LOZENGE | OROMUCOSAL | Status: DC | PRN

## 2015-11-04 MED ORDER — HYDROMORPHONE HCL 2 MG/ML IJ SOLN
INTRAMUSCULAR | Status: AC
  Filled 2015-11-04: qty 1

## 2015-11-04 MED ORDER — ALUM & MAG HYDROXIDE-SIMETH 200-200-20 MG/5ML PO SUSP: 30.0000 mL | ORAL | Status: DC | PRN

## 2015-11-04 MED ORDER — LACTATED RINGERS IV SOLN
INTRAVENOUS | Status: DC | PRN
  Administered 2015-11-04 (×2): via INTRAVENOUS

## 2015-11-04 MED ORDER — FENTANYL CITRATE (PF) 100 MCG/2ML IJ SOLN
INTRAMUSCULAR | Status: DC | PRN
  Administered 2015-11-04 (×2): 50 ug via INTRAVENOUS
  Administered 2015-11-04: 100 ug via INTRAVENOUS

## 2015-11-04 MED ORDER — DEXTROSE 5 % IV SOLN
500.0000 mg | Freq: Three times a day (TID) | INTRAVENOUS | Status: DC
  Administered 2015-11-04 – 2015-11-05 (×2): 500 mg via INTRAVENOUS
  Filled 2015-11-04 (×8): qty 5

## 2015-11-04 MED ORDER — CHLORHEXIDINE GLUCONATE 4 % EX LIQD: 60.0000 mL | Freq: Once | CUTANEOUS | Status: DC

## 2015-11-04 MED ORDER — HYDROMORPHONE HCL 1 MG/ML IJ SOLN
0.2500 mg | INTRAMUSCULAR | Status: DC | PRN
  Administered 2015-11-04 (×2): 0.25 mg via INTRAVENOUS
  Administered 2015-11-04: 0.5 mg via INTRAVENOUS

## 2015-11-04 MED ORDER — HYDROMORPHONE HCL 1 MG/ML IJ SOLN: 0.5000 mg | INTRAMUSCULAR | Status: DC | PRN

## 2015-11-04 MED ORDER — MIDAZOLAM HCL 2 MG/2ML IJ SOLN
INTRAMUSCULAR | Status: AC
  Filled 2015-11-04: qty 2

## 2015-11-04 MED ORDER — VITAMIN D 1000 UNITS PO TABS
2000.0000 [IU] | ORAL_TABLET | Freq: Every day | ORAL | Status: DC
  Administered 2015-11-04 – 2015-11-06 (×3): 2000 [IU] via ORAL
  Filled 2015-11-04 (×3): qty 2

## 2015-11-04 MED ORDER — TRANEXAMIC ACID 1000 MG/10ML IV SOLN
2000.0000 mg | INTRAVENOUS | Status: DC
  Filled 2015-11-04: qty 20

## 2015-11-04 MED ORDER — METOCLOPRAMIDE HCL 10 MG PO TABS: 5.0000 mg | ORAL_TABLET | Freq: Three times a day (TID) | ORAL | Status: DC | PRN

## 2015-11-04 MED ORDER — HYDROMORPHONE HCL 1 MG/ML IJ SOLN
INTRAMUSCULAR | Status: DC | PRN
  Administered 2015-11-04 (×2): 1 mg via INTRAVENOUS

## 2015-11-04 MED ORDER — PRAVASTATIN SODIUM 10 MG PO TABS
10.0000 mg | ORAL_TABLET | Freq: Every day | ORAL | Status: DC
  Administered 2015-11-05: 10 mg via ORAL
  Filled 2015-11-04 (×3): qty 1

## 2015-11-04 MED ORDER — ONDANSETRON HCL 4 MG/2ML IJ SOLN
INTRAMUSCULAR | Status: AC
  Filled 2015-11-04: qty 2

## 2015-11-04 MED ORDER — BUPIVACAINE-EPINEPHRINE (PF) 0.25% -1:200000 IJ SOLN
INTRAMUSCULAR | Status: DC | PRN
Start: 2015-11-04 — End: 2015-11-04
  Administered 2015-11-04: 30 mL via PERINEURAL

## 2015-11-04 MED ORDER — METOCLOPRAMIDE HCL 5 MG/ML IJ SOLN
5.0000 mg | Freq: Three times a day (TID) | INTRAMUSCULAR | Status: DC | PRN
  Administered 2015-11-04: 10 mg via INTRAVENOUS
  Filled 2015-11-04: qty 2

## 2015-11-04 MED ORDER — FENTANYL CITRATE (PF) 100 MCG/2ML IJ SOLN
INTRAMUSCULAR | Status: AC
  Filled 2015-11-04: qty 2

## 2015-11-04 MED ORDER — ONDANSETRON HCL 4 MG/2ML IJ SOLN: 4.0000 mg | Freq: Four times a day (QID) | INTRAMUSCULAR | Status: DC | PRN

## 2015-11-04 MED ORDER — ROPIVACAINE HCL 5 MG/ML IJ SOLN
INTRAMUSCULAR | Status: DC | PRN
  Administered 2015-11-04: 20 mL via PERINEURAL

## 2015-11-04 MED ORDER — VANCOMYCIN HCL IN DEXTROSE 1-5 GM/200ML-% IV SOLN
1000.0000 mg | Freq: Two times a day (BID) | INTRAVENOUS | Status: AC
  Administered 2015-11-05: 1000 mg via INTRAVENOUS
  Filled 2015-11-04: qty 200

## 2015-11-04 MED ORDER — HYDROGEN PEROXIDE 3 % EX SOLN
CUTANEOUS | Status: DC | PRN
  Administered 2015-11-04: 1

## 2015-11-04 MED ORDER — DOCUSATE SODIUM 100 MG PO CAPS
100.0000 mg | ORAL_CAPSULE | Freq: Two times a day (BID) | ORAL | Status: DC
  Administered 2015-11-05 – 2015-11-06 (×2): 100 mg via ORAL

## 2015-11-04 MED ORDER — DEXAMETHASONE SODIUM PHOSPHATE 10 MG/ML IJ SOLN
INTRAMUSCULAR | Status: DC | PRN
  Administered 2015-11-04: 10 mg via INTRAVENOUS

## 2015-11-04 MED ORDER — SODIUM CHLORIDE 0.9 % IJ SOLN
INTRAMUSCULAR | Status: DC | PRN
  Administered 2015-11-04: 30 mL

## 2015-11-04 MED ORDER — ONDANSETRON HCL 4 MG/2ML IJ SOLN
INTRAMUSCULAR | Status: DC | PRN
  Administered 2015-11-04: 4 mg via INTRAVENOUS

## 2015-11-04 MED ORDER — PROPOFOL 10 MG/ML IV BOLUS
INTRAVENOUS | Status: AC
  Filled 2015-11-04: qty 40

## 2015-11-04 MED ORDER — GLIPIZIDE ER 5 MG PO TB24
5.0000 mg | ORAL_TABLET | Freq: Two times a day (BID) | ORAL | Status: DC
  Administered 2015-11-04 – 2015-11-06 (×4): 5 mg via ORAL
  Filled 2015-11-04 (×7): qty 1

## 2015-11-04 MED ORDER — ACETAMINOPHEN 325 MG PO TABS: 650.0000 mg | ORAL_TABLET | Freq: Four times a day (QID) | ORAL | Status: DC | PRN

## 2015-11-04 MED ORDER — CEFAZOLIN SODIUM-DEXTROSE 2-3 GM-% IV SOLR
2.0000 g | INTRAVENOUS | Status: AC
  Administered 2015-11-04: 2 g via INTRAVENOUS

## 2015-11-04 MED ORDER — SODIUM CHLORIDE 0.9 % IV SOLN: INTRAVENOUS | Status: DC

## 2015-11-04 MED ORDER — CARVEDILOL 3.125 MG PO TABS
3.1250 mg | ORAL_TABLET | Freq: Two times a day (BID) | ORAL | Status: DC
  Administered 2015-11-04 – 2015-11-06 (×4): 3.125 mg via ORAL
  Filled 2015-11-04 (×6): qty 1

## 2015-11-04 MED ORDER — CEFAZOLIN SODIUM-DEXTROSE 2-3 GM-% IV SOLR
INTRAVENOUS | Status: AC
  Filled 2015-11-04: qty 50

## 2015-11-04 MED ORDER — DEXAMETHASONE SODIUM PHOSPHATE 10 MG/ML IJ SOLN
10.0000 mg | Freq: Once | INTRAMUSCULAR | Status: AC
  Administered 2015-11-05: 10 mg via INTRAVENOUS
  Filled 2015-11-04: qty 1

## 2015-11-04 MED ORDER — PHENOL 1.4 % MT LIQD
1.0000 | OROMUCOSAL | Status: DC | PRN
Start: 2015-11-04 — End: 2015-11-06

## 2015-11-04 MED ORDER — BUPIVACAINE-EPINEPHRINE (PF) 0.25% -1:200000 IJ SOLN
INTRAMUSCULAR | Status: AC
  Filled 2015-11-04: qty 30

## 2015-11-04 MED ORDER — HYDROGEN PEROXIDE 3 % EX SOLN
CUTANEOUS | Status: AC
  Filled 2015-11-04: qty 473

## 2015-11-04 MED ORDER — ONDANSETRON HCL 4 MG PO TABS: 4.0000 mg | ORAL_TABLET | Freq: Four times a day (QID) | ORAL | Status: DC | PRN

## 2015-11-04 MED ORDER — PHENYLEPHRINE HCL 10 MG/ML IJ SOLN
INTRAMUSCULAR | Status: DC | PRN
  Administered 2015-11-04 (×5): 80 ug via INTRAVENOUS

## 2015-11-04 MED ORDER — ISOPROPYL ALCOHOL 70 % SOLN
Status: DC | PRN
  Administered 2015-11-04: 1 via TOPICAL

## 2015-11-04 MED ORDER — VANCOMYCIN HCL IN DEXTROSE 1-5 GM/200ML-% IV SOLN
1000.0000 mg | Freq: Once | INTRAVENOUS | Status: AC
  Administered 2015-11-04: 1000 mg via INTRAVENOUS

## 2015-11-04 MED ORDER — TRANEXAMIC ACID 1000 MG/10ML IV SOLN
1720.0000 mg | INTRAVENOUS | Status: DC | PRN
  Administered 2015-11-04: 1433 mg via TOPICAL

## 2015-11-04 MED ORDER — SENNA 8.6 MG PO TABS: 2.0000 | ORAL_TABLET | Freq: Every day | ORAL | Status: DC

## 2015-11-04 MED ORDER — DIPHENHYDRAMINE HCL 12.5 MG/5ML PO ELIX: 12.5000 mg | ORAL_SOLUTION | ORAL | Status: DC | PRN

## 2015-11-04 MED ORDER — SUCCINYLCHOLINE CHLORIDE 20 MG/ML IJ SOLN
INTRAMUSCULAR | Status: DC | PRN
  Administered 2015-11-04: 100 mg via INTRAVENOUS

## 2015-11-04 MED ORDER — SODIUM CHLORIDE 0.9 % IV SOLN
INTRAVENOUS | Status: DC
  Administered 2015-11-04: 75 mL/h via INTRAVENOUS

## 2015-11-04 SURGICAL SUPPLY — 60 items
BAG ZIPLOCK 12X15 (MISCELLANEOUS) IMPLANT
BANDAGE ACE 4X5 VEL STRL LF (GAUZE/BANDAGES/DRESSINGS) ×3 IMPLANT
BANDAGE ACE 6X5 VEL STRL LF (GAUZE/BANDAGES/DRESSINGS) ×3 IMPLANT
BLADE SAW RECIPROCATING 77.5 (BLADE) ×3 IMPLANT
CAPT KNEE TRIATH TK-4 ×3 IMPLANT
CHLORAPREP W/TINT 26ML (MISCELLANEOUS) ×6 IMPLANT
CLOTH BEACON ORANGE TIMEOUT ST (SAFETY) ×3 IMPLANT
CUFF TOURN SGL QUICK 34 (TOURNIQUET CUFF) ×2
CUFF TRNQT CYL 34X4X40X1 (TOURNIQUET CUFF) ×1 IMPLANT
DECANTER SPIKE VIAL GLASS SM (MISCELLANEOUS) ×3 IMPLANT
DRAPE LG THREE QUARTER DISP (DRAPES) ×3 IMPLANT
DRAPE SHEET LG 3/4 BI-LAMINATE (DRAPES) ×6 IMPLANT
DRAPE U-SHAPE 47X51 STRL (DRAPES) ×3 IMPLANT
DRSG AQUACEL AG ADV 3.5X10 (GAUZE/BANDAGES/DRESSINGS) ×3 IMPLANT
DRSG TEGADERM 4X4.75 (GAUZE/BANDAGES/DRESSINGS) ×3 IMPLANT
ELECT PENCIL ROCKER SW 15FT (MISCELLANEOUS) ×3 IMPLANT
ELECT REM PT RETURN 15FT ADLT (MISCELLANEOUS) ×3 IMPLANT
EVACUATOR 1/8 PVC DRAIN (DRAIN) ×3 IMPLANT
GAUZE SPONGE 4X4 12PLY STRL (GAUZE/BANDAGES/DRESSINGS) ×3 IMPLANT
GLOVE BIO SURGEON STRL SZ8.5 (GLOVE) ×12 IMPLANT
GLOVE BIOGEL M 7.0 STRL (GLOVE) ×6 IMPLANT
GLOVE BIOGEL PI IND STRL 6.5 (GLOVE) ×1 IMPLANT
GLOVE BIOGEL PI IND STRL 7.5 (GLOVE) ×1 IMPLANT
GLOVE BIOGEL PI IND STRL 8.5 (GLOVE) ×1 IMPLANT
GLOVE BIOGEL PI INDICATOR 6.5 (GLOVE) ×2
GLOVE BIOGEL PI INDICATOR 7.5 (GLOVE) ×2
GLOVE BIOGEL PI INDICATOR 8.5 (GLOVE) ×2
GLOVE SURG SS PI 6.5 STRL IVOR (GLOVE) ×3 IMPLANT
GLOVE SURG SS PI 7.0 STRL IVOR (GLOVE) ×3 IMPLANT
GLOVE SURG SS PI 7.5 STRL IVOR (GLOVE) ×3 IMPLANT
GOWN SPEC L3 XXLG W/TWL (GOWN DISPOSABLE) ×3 IMPLANT
GOWN STRL REUS W/TWL XL LVL3 (GOWN DISPOSABLE) ×9 IMPLANT
HANDPIECE INTERPULSE COAX TIP (DISPOSABLE) ×2
HOOD PEEL AWAY FLYTE STAYCOOL (MISCELLANEOUS) ×6 IMPLANT
LIQUID BAND (GAUZE/BANDAGES/DRESSINGS) ×3 IMPLANT
MANIFOLD NEPTUNE II (INSTRUMENTS) ×3 IMPLANT
MARKER SKIN DUAL TIP RULER LAB (MISCELLANEOUS) ×3 IMPLANT
NEEDLE SPNL 18GX3.5 QUINCKE PK (NEEDLE) ×3 IMPLANT
PACK TOTAL KNEE CUSTOM (KITS) ×3 IMPLANT
PADDING CAST COTTON 6X4 STRL (CAST SUPPLIES) ×3 IMPLANT
POSITIONER SURGICAL ARM (MISCELLANEOUS) ×3 IMPLANT
SAW OSC TIP CART 19.5X105X1.3 (SAW) ×3 IMPLANT
SEALER BIPOLAR AQUA 6.0 (INSTRUMENTS) ×3 IMPLANT
SET HNDPC FAN SPRY TIP SCT (DISPOSABLE) ×1 IMPLANT
SET PAD KNEE POSITIONER (MISCELLANEOUS) ×3 IMPLANT
SOL PREP POV-IOD 4OZ 10% (MISCELLANEOUS) ×3 IMPLANT
SPONGE DRAIN TRACH 4X4 STRL 2S (GAUZE/BANDAGES/DRESSINGS) ×3 IMPLANT
SUT MNCRL AB 3-0 PS2 18 (SUTURE) ×3 IMPLANT
SUT MON AB 2-0 CT1 36 (SUTURE) ×6 IMPLANT
SUT VIC AB 1 CT1 36 (SUTURE) ×3 IMPLANT
SUT VIC AB 2-0 CT1 27 (SUTURE) ×2
SUT VIC AB 2-0 CT1 TAPERPNT 27 (SUTURE) ×1 IMPLANT
SUT VLOC 180 0 24IN GS25 (SUTURE) ×3 IMPLANT
SYR 50ML LL SCALE MARK (SYRINGE) ×3 IMPLANT
TOWER CARTRIDGE SMART MIX (DISPOSABLE) IMPLANT
TRAY FOLEY W/METER SILVER 14FR (SET/KITS/TRAYS/PACK) ×3 IMPLANT
TRAY FOLEY W/METER SILVER 16FR (SET/KITS/TRAYS/PACK) IMPLANT
WATER STERILE IRR 1500ML POUR (IV SOLUTION) ×3 IMPLANT
WRAP KNEE MAXI GEL POST OP (GAUZE/BANDAGES/DRESSINGS) ×3 IMPLANT
YANKAUER SUCT BULB TIP 10FT TU (MISCELLANEOUS) ×3 IMPLANT

## 2015-11-04 NOTE — Progress Notes (Signed)
Utilization review completed.  

## 2015-11-04 NOTE — Interval H&P Note (Signed)
History and Physical Interval Note:  11/04/2015 12:31 PM  Cassandra Parker  has presented today for surgery, with the diagnosis of [place]  The various methods of treatment have been discussed with the patient and family. After consideration of risks, benefits and other options for treatment, the patient has consented to  Procedure(s): LEFT TOTAL KNEE ARTHROPLASTY (Left) as a surgical intervention .  The patient's history has been reviewed, patient examined, no change in status, stable for surgery.  I have reviewed the patient's chart and labs.  Questions were answered to the patient's satisfaction.     Aishwarya Shiplett, Horald Pollen

## 2015-11-04 NOTE — H&P (View-Only) (Signed)
TOTAL KNEE ADMISSION H&P  Patient is being admitted for left total knee arthroplasty.  Subjective:  Chief Complaint:left knee pain.  HPI: Cassandra Parker, 75 y.o. female, has a history of pain and functional disability in the left knee due to arthritis and has failed non-surgical conservative treatments for greater than 12 weeks to includeNSAID's and/or analgesics, corticosteriod injections, flexibility and strengthening excercises, use of assistive devices, weight reduction as appropriate and activity modification.  Onset of symptoms was gradual, starting >10 years ago with gradually worsening course since that time. The patient noted no past surgery on the left knee(s).  Patient currently rates pain in the left knee(s) at 10 out of 10 with activity. Patient has night pain, worsening of pain with activity and weight bearing, pain that interferes with activities of daily living, pain with passive range of motion, crepitus and joint swelling.  Patient has evidence of subchondral cysts, subchondral sclerosis, periarticular osteophytes, joint subluxation and joint space narrowing by imaging studies.  There is no active infection.  Patient Active Problem List   Diagnosis Date Noted  . Pacemaker 10/23/2014  . Acute renal insufficiency 10/15/2013  . Mitral stenosis- moderate with trivial MR  10/15/2013  . CHB (complete heart block) ([place]) 10/13/2013  . Syncope 10/13/2013  . HTN (hypertension) 10/13/2013  . Diabetes mellitus type 2, controlled, without complications ([place]) 123456  . Cancer of breast- s/p chemo and Rt mastectomy 2008 10/13/2013  . Cardiomyopathy- EF 35-40% (etiology not yet determined) 10/13/2013  . Dyslipidemia 10/13/2013   Past Medical History  Diagnosis Date  . Hypertension   . Hypercholesterolemia   . Diabetes mellitus     with neuropathy  . Cancer [facility]) 2008    right breast  . Cardiomyopathy ([place])     dating back to 2010  . CHF (congestive heart failure) ([place]) pt  stated she was recently diagnosed in another hospital with heart failure and a "weak heart"    Past Surgical History  Procedure Laterality Date  . Mastectomy  2008     mmh-right  . Knee arthroscopy      right-mmh  . Cholecystectomy      mmh  . Appendectomy      mmh  . Cataract extraction w/phaco  06/29/2011    Procedure: CATARACT EXTRACTION PHACO AND INTRAOCULAR LENS PLACEMENT (IOC);  Surgeon: Tonny Branch;  Location: [facility] ORS;  Service: Ophthalmology;  Laterality: Right;  CDE: 14.41  . Cataract extraction w/phaco  09/04/2011    Procedure: CATARACT EXTRACTION PHACO AND INTRAOCULAR LENS PLACEMENT (IOC);  Surgeon: Tonny Branch;  Location: [facility]S;  Service: Ophthalmology;  Laterality: Left;  CDE: 15.21  . Cardiomyopathy    . Temporary pacemaker insertion N/A 10/13/2013    Procedure: TEMPORARY PACEMAKER INSERTION;  Surgeon: Jolaine Artist, MD;  Location[facility] CATH LAB;  Service: Cardiovascular;  Laterality: N/A;  . Bi-ventricular pacemaker insertion N/A 10/15/2013    Procedure: BI-VENTRICULAR PACEMAKER INSERTION (CRT-P);  Surgeon: Evans Lance, MD;  Location: [facility] CATH LAB;  Service: Cardiovascular;  Laterality: N/A;     (Not in a hospital admission) No Known Allergies  Social History  Substance Use Topics  . Smoking status: Never Smoker   . Smokeless tobacco: Never Used  . Alcohol Use: No    Family History  Problem Relation Age of Onset  . Anesthesia problems Neg Hx   . Hypotension Neg Hx   . Malignant hyperthermia Neg Hx   . Pseudochol deficiency Neg Hx      Review of  Systems  Constitutional: Negative.   HENT: Negative.   Eyes: Negative.   Respiratory: Negative.   Cardiovascular: Negative.   Gastrointestinal: Negative.   Genitourinary: Negative.   Musculoskeletal: Positive for joint pain.  Skin: Negative.   Neurological: Negative.   Psychiatric/Behavioral: Negative.     Objective:  Physical Exam  Constitutional: She is oriented to person, place, and time. She appears  well-developed and well-nourished.  HENT:  Head: Normocephalic and atraumatic.  Eyes: Conjunctivae and EOM are normal. Pupils are equal, round, and reactive to light.  Neck: Normal range of motion. Neck supple.  Cardiovascular: Normal rate and regular rhythm.   Respiratory: Effort normal and breath sounds normal. No respiratory distress.  GI: Soft. Bowel sounds are normal.  Genitourinary:  deferred  Musculoskeletal:       Left knee: She exhibits decreased range of motion, swelling and deformity.  0-105 degrees Pulses not palpable ABI > 1  Neurological: She is alert and oriented to person, place, and time. She has normal reflexes.  Skin: Skin is warm and dry.  Psychiatric: She has a normal mood and affect. Her behavior is normal. Judgment and thought content normal.    Vital signs in last 24 hours: @VSRANGES @  Labs:   Estimated body mass index is 39.16 kg/(m^2) as calculated from the following:   Height as of 07/06/15: 5\' 3"  (1.6 m).   Weight as of 07/06/15: 100.245 kg (221 lb).   Imaging Review Plain radiographs demonstrate severe degenerative joint disease of the left knee(s). The overall alignment issignificant valgus. The bone quality appears to be adequate for age and reported activity level.  Assessment/Plan:  End stage arthritis, left knee   The patient history, physical examination, clinical judgment of the provider and imaging studies are consistent with end stage degenerative joint disease of the left knee(s) and total knee arthroplasty is deemed medically necessary. The treatment options including medical management, injection therapy arthroscopy and arthroplasty were discussed at length. The risks and benefits of total knee arthroplasty were presented and reviewed. The risks due to aseptic loosening, infection, stiffness, patella tracking problems, thromboembolic complications and other imponderables were discussed. The patient acknowledged the explanation, agreed to  proceed with the plan and consent was signed. Patient is being admitted for inpatient treatment for surgery, pain control, PT, OT, prophylactic antibiotics, VTE prophylaxis, progressive ambulation and ADL's and discharge planning. The patient is planning to be discharged home with home health services

## 2015-11-04 NOTE — Op Note (Signed)
OPERATIVE REPORT  SURGEON: Rod Can, MD.   ASSISTANT: Nehemiah Massed, PA-C.  PREOPERATIVE DIAGNOSIS: Left knee arthritis.   POSTOPERATIVE DIAGNOSIS: Left knee arthritis.   PROCEDURE: Left total knee arthroplasty.   IMPLANTS: Stryker Triathlon CR femur, size 3. Stryker Triathlon Tritanium tibia, size 3. X3 polyethylene insert, size 11 mm, CR. Tritanium 3 button asymmetric patella, size 29 mm.  ANESTHESIA:  General and Regional  ESTIMATED BLOOD LOSS: 150 mL.  ANTIBIOTICS: 1. 2g ancef. 2. 1g vancomycin.  DRAINS: medium HV x1.  COMPLICATIONS: None   CONDITION: PACU - hemodynamically stable.   BRIEF CLINICAL NOTE: Cassandra Parker is a 75 y.o. female with a long-standing history of Left knee arthritis. After failing conservative management, the patient was indicated for total knee arthroplasty. The risks, benefits, and alternatives to the procedure were explained, and the patient elected to proceed.  PROCEDURE IN DETAIL: Spinal anesthesia was obtained in the pre-op holding area. Once inside the operative room, a foley catheter was inserted. The patient was then positioned, a nonsterile tourniquet was placed, and the lower extremity was prepped and draped in the normal sterile surgical fashion. A time-out was called verifying side and site of surgery. The patient received IV antibiotics within 60 minutes of beginning the procedure. The extremity was exsanguinated with an Esmarch, and the tourniquet was inflated to 340 mmHg.  An anterior approach to the knee was performed utilizing a midvastus arthrotomy. A medial release was performed and the patellar fat pad was excised. A 5-degree distal femur valgus cut was made with an intramedullary guide. A freehand patellar resection was performed, and the patella was sized an prepared with 3 lug holes.  The proximal tibial resection was performed using an extramedullary guide, leaving a bone island for the PCL. The menisci were  excised. A spacer block was placed, and the alignment and balance in extension were confirmed.   The distal femur was sized using the 3-degree external rotation guide referencing the posterior femoral cortex. The appropriate 4-in-1 cutting block was pinned into place, and the rotation was checked using a spacer block at 90 degrees of flexion. The remaining femoral cuts were performed, taking care to protect the MCL.  The tibia was sized and the trial tray was pinned into place. The remaining trail components were inserted. The knee was stable to varus and valgus stress through a full range of motion. The patella tracked centrally, and the PCL was well balanced. The trial components were removed, and the proximal tibial surface was prepared. Final components were impacted into place. The knee was tested for a final time and found to be well balanced. The tourniquet was let down and meticulous hemostasis was achieved.  The wound was copiously irrigated with a dilute betadine solution followed by normal saline with pulse lavage. Marcaine solution was injected into the periarticular soft tissue. The wound was closed in layers using #1 Vicryl and V-Loc for the fascia, 2-0 Vicryl for the subcutaneous fat, 2-0 Monocryl for the deep dermal layer, 3-0 running Monocryl subcuticular Stitch, and Dermabond for the skin. Once the glue was fully dried, an Aquacell Ag and compressive dressing were applied. The patient was transported to the recovery room in stable condition. Sponge, needle, and instrument counts were correct at the end of the case x2. The patient tolerated the procedure well and there were no known complications.  Please note that a surgical assistant was a medical necessity for this procedure in order to perform it in a safe and expeditious  manner. Surgical assistant was necessary to retract the ligaments and vital neurovascular structures to prevent injury to them and also necessary for proper  positioning of the limb to allow for anatomic placement of the prosthesis.

## 2015-11-04 NOTE — Transfer of Care (Signed)
Immediate Anesthesia Transfer of Care Note  Patient: Cassandra Parker  Procedure(s) Performed: Procedure(s): LEFT TOTAL KNEE ARTHROPLASTY (Left)  Patient Location: PACU  Anesthesia Type:General  Level of Consciousness:  sedated, patient cooperative and responds to stimulation  Airway & Oxygen Therapy:Patient Spontanous Breathing and Patient connected to face mask oxgen  Post-op Assessment:  Report given to PACU RN and Post -op Vital signs reviewed and stable  Post vital signs:  Reviewed and stable  Last Vitals:  Filed Vitals:   11/04/15 1033  BP: 170/80  Pulse: 85  Temp: 36.9 C  Resp: 16    Complications: No apparent anesthesia complications

## 2015-11-04 NOTE — Anesthesia Postprocedure Evaluation (Signed)
Anesthesia Post Note  Patient: Cassandra Parker  Procedure(s) Performed: Procedure(s) (LRB): LEFT TOTAL KNEE ARTHROPLASTY (Left)  Patient location during evaluation: PACU Anesthesia Type: Regional and General Level of consciousness: awake and alert Pain management: pain level controlled Vital Signs Assessment: post-procedure vital signs reviewed and stable Respiratory status: spontaneous breathing Cardiovascular status: blood pressure returned to baseline Anesthetic complications: no    Last Vitals:  Filed Vitals:   11/04/15 1903 11/04/15 2005  BP: 144/66 157/77  Pulse: 75 88  Temp: 36.8 C 36.6 C  Resp: 15 15    Last Pain:  Filed Vitals:   11/04/15 2011  PainSc: Asleep                 [name]

## 2015-11-04 NOTE — Discharge Summary (Signed)
Physician Discharge Summary  Patient ID: VICKE DUNSMORE MRN: [id number] DOB/AGE: 1940/09/29 75 y.o.  Admit date: 11/04/2015 Discharge date: 11/07/2015  Admission Diagnoses:  Primary osteoarthritis of left knee  Discharge Diagnoses:  Principal Problem:   Primary osteoarthritis of left knee   Past Medical History  Diagnosis Date  . Hypertension   . Hypercholesterolemia   . Diabetes mellitus     with neuropathy  . Cancer [facility]) 2008    right breast  . Cardiomyopathy ([place])     dating back to 2010  . CHF (congestive heart failure) ([place]) pt stated she was recently diagnosed in another hospital with heart failure and a "weak heart"  . Presence of permanent cardiac pacemaker   . Arthritis   . History of blood transfusion   . Tingling     feet bilat     Surgeries: Procedure(s): LEFT TOTAL KNEE ARTHROPLASTY on 11/04/2015   Consultants (if any):    Discharged Condition: Improved  Hospital Course: POETRY SIS is an 75 y.o. female who was admitted 11/04/2015 with a diagnosis of Primary osteoarthritis of left knee and went to the operating room on 11/04/2015 and underwent the above named procedures.    She was given perioperative antibiotics:      Anti-infectives    Start     Dose/Rate Route Frequency Ordered Stop   11/05/15 0000  vancomycin (VANCOCIN) IVPB 1000 mg/200 mL premix     1,000 mg 200 mL/hr over 60 Minutes Intravenous Every 12 hours 11/04/15 1708 11/05/15 0146   11/04/15 1145  vancomycin (VANCOCIN) IVPB 1000 mg/200 mL premix     1,000 mg 200 mL/hr over 60 Minutes Intravenous  Once 11/04/15 1143 11/04/15 1345   11/04/15 1046  ceFAZolin (ANCEF) IVPB 2 g/50 mL premix     2 g 100 mL/hr over 30 Minutes Intravenous On call to O.R. 11/04/15 1046 11/04/15 1330    .  She was given sequential compression devices, early ambulation, and apixaban for DVT prophylaxis.  She benefited maximally from the hospital stay and there were no complications.    Recent vital  signs:  Filed Vitals:   11/06/15 0731 11/06/15 0945  BP: 151/63 149/61  Pulse: 73 71  Temp:  97.6 F (36.4 C)  Resp: 16 16    Recent laboratory studies:  Lab Results  Component Value Date   HGB 8.1* 11/06/2015   HGB 8.6* 11/05/2015   HGB 11.1* 10/29/2015   Lab Results  Component Value Date   WBC 13.6* 11/06/2015   PLT 212 11/06/2015   Lab Results  Component Value Date   INR 1.02 10/29/2015   Lab Results  Component Value Date   NA 135 11/05/2015   K 4.7 11/05/2015   CL 108 11/05/2015   CO2 18* 11/05/2015   BUN 54* 11/05/2015   CREATININE 2.06* 11/05/2015   GLUCOSE 324* 11/05/2015    Discharge Medications:     Medication List    STOP taking these medications        acetaminophen 500 MG tablet  Commonly known as:  TYLENOL     aspirin EC 81 MG tablet     Azilsartan Medoxomil 80 MG Tabs      TAKE these medications        apixaban 2.5 MG Tabs tablet  Commonly known as:  ELIQUIS  Take 1 tablet (2.5 mg total) by mouth every 12 (twelve) hours.     carvedilol 6.25 MG tablet  Commonly known as:  COREG  Take  0.5 tablets (3.125 mg total) by mouth 2 (two) times daily.     chlorthalidone 25 MG tablet  Commonly known as:  HYGROTON  Take 12.5 mg by mouth daily.     docusate sodium 100 MG capsule  Commonly known as:  COLACE  Take 1 capsule (100 mg total) by mouth 2 (two) times daily.     FISH OIL PO  Take 1 capsule by mouth daily.     glipiZIDE 5 MG 24 hr tablet  Commonly known as:  GLUCOTROL XL  Take 5 mg by mouth 2 (two) times daily.     HYDROcodone-acetaminophen 5-325 MG tablet  Commonly known as:  NORCO  Take 1-2 tablets by mouth every 4 (four) hours as needed for moderate pain.     ondansetron 4 MG tablet  Commonly known as:  ZOFRAN  Take 1 tablet (4 mg total) by mouth every 6 (six) hours as needed for nausea.     pravastatin 10 MG tablet  Commonly known as:  PRAVACHOL  Take 10 mg by mouth at bedtime.     senna 8.6 MG Tabs tablet  Commonly  known as:  SENOKOT  Take 2 tablets (17.2 mg total) by mouth at bedtime.     Vitamin D 2000 units tablet  Take 2,000 Units by mouth daily.        Diagnostic Studies: Dg Knee 1-2 Views Left  20-Nov-2015  CLINICAL DATA:  Post LEFT total knee replacement EXAM: LEFT KNEE - 1-2 VIEW COMPARISON:  Portable exam 1558 hours compared to 05/04/2015 FINDINGS: Components of LEFT knee prosthesis identified in expected positions. No acute fracture or dislocation. Surgical drain and expected postsurgical soft tissue changes present. Ice pack noted. IMPRESSION: LEFT knee prosthesis without acute complication. Electronically Signed   By: Lavonia Dana M.D.   On: 11-20-2015 16:17    Disposition: 06-Home-Health Care Svc  Discharge Instructions    Call MD / Call 911    Complete by:  As directed   If you experience chest pain or shortness of breath, CALL 911 and be transported to the hospital emergency room.  If you develope a fever above 101 F, pus (white drainage) or increased drainage or redness at the wound, or calf pain, call your surgeon's office.     Constipation Prevention    Complete by:  As directed   Drink plenty of fluids.  Prune juice may be helpful.  You may use a stool softener, such as Colace (over the counter) 100 mg twice a day.  Use MiraLax (over the counter) for constipation as needed.     Diet - low sodium heart healthy    Complete by:  As directed      Do not put a pillow under the knee. Place it under the heel.    Complete by:  As directed      Driving restrictions    Complete by:  As directed   No driving for 6 weeks     Increase activity slowly as tolerated    Complete by:  As directed      Lifting restrictions    Complete by:  As directed   No lifting for 6 weeks     TED hose    Complete by:  As directed   Use stockings (TED hose) for 2 weeks on both leg(s).  You may remove them at night for sleeping.           Follow-up Information    Follow up with  Jude Linck, Horald Pollen,  MD. Schedule an appointment as soon as possible for a visit in 2 weeks.   Specialty:  Orthopedic Surgery   Why:  For wound re-check   Contact information:   [address] [address] [phone]       Follow up with [facility].   Why:  Home Health Physical Therapy   Contact information:   [address] [place] [address] [phone]        Signed: Elie Goody 11/07/2015, 11:21 AM

## 2015-11-04 NOTE — Anesthesia Procedure Notes (Addendum)
Procedure Name: Intubation Date/Time: 11/04/2015 12:58 PM Performed by: Lajuana Carry E Pre-anesthesia Checklist: Patient identified, Emergency Drugs available, Suction available and Patient being monitored Patient Re-evaluated:Patient Re-evaluated prior to inductionOxygen Delivery Method: Circle System Utilized Preoxygenation: Pre-oxygenation with 100% oxygen Intubation Type: IV induction Ventilation: Mask ventilation without difficulty Laryngoscope Size: Mac and 3 Grade View: Grade I Tube type: Oral Tube size: 7.0 mm Number of attempts: 1 Placement Confirmation: ETT inserted through vocal cords under direct vision,  positive ETCO2 and breath sounds checked- equal and bilateral Secured at: 21 cm Tube secured with: Tape Dental Injury: Teeth and Oropharynx as per pre-operative assessment     Anesthesia Regional Block:  Adductor canal block  Pre-Anesthetic Checklist: ,, timeout performed, Correct Patient, Correct Site, Correct Laterality, Correct Procedure, Correct Position, site marked, Risks and benefits discussed,  Surgical consent,  Pre-op evaluation,  At surgeon's request and post-op pain management  Laterality: Left  Prep: chloraprep       Needles:  Injection technique: Single-shot  Needle Type: Echogenic Needle     Needle Length: 9cm 9 cm Needle Gauge: 21 and 21 G    Additional Needles:  Procedures: ultrasound guided (picture in chart) Adductor canal block Narrative:  Start time: 11/04/2015 3:12 PM End time: 11/04/2015 3:19 PM Injection made incrementally with aspirations every 5 mL.  Performed by: Personally  Anesthesiologist: Suzette Battiest

## 2015-11-04 NOTE — Anesthesia Preprocedure Evaluation (Addendum)
Anesthesia Evaluation  Patient identified by MRN, date of birth, ID band Patient awake    Reviewed: Allergy & Precautions, NPO status , Patient's Chart, lab work & pertinent test results, reviewed documented beta blocker date and time   Airway Mallampati: II  TM Distance: >3 FB Neck ROM: Full    Dental   Pulmonary neg pulmonary ROS,    breath sounds clear to auscultation       Cardiovascular hypertension, Pt. on medications and Pt. on home beta blockers + dysrhythmias + pacemaker + Valvular Problems/Murmurs (Mild MS)  Rhythm:Regular Rate:Normal     Neuro/Psych negative neurological ROS     GI/Hepatic negative GI ROS, Neg liver ROS,   Endo/Other  diabetes, Type 2, Oral Hypoglycemic AgentsMorbid obesity  Renal/GU CRFRenal disease     Musculoskeletal  (+) Arthritis ,   Abdominal   Peds  Hematology  (+) anemia ,   Anesthesia Other Findings   Reproductive/Obstetrics                            Lab Results  Component Value Date   WBC 9.2 10/29/2015   HGB 11.1* 10/29/2015   HCT 34.6* 10/29/2015   MCV 93.0 10/29/2015   PLT 289 10/29/2015   Lab Results  Component Value Date   CREATININE 1.97* 10/29/2015   BUN 53* 10/29/2015   NA 139 10/29/2015   K 4.2 10/29/2015   CL 105 10/29/2015   CO2 23 10/29/2015   Lab Results  Component Value Date   INR 1.02 10/29/2015   INR 0.97 10/13/2013   INR 1.05 06/14/2010    Anesthesia Physical Anesthesia Plan  ASA: III  Anesthesia Plan: General and Regional   Post-op Pain Management: GA combined w/ Regional for post-op pain   Induction: Intravenous  Airway Management Planned: Oral ETT  Additional Equipment:   Intra-op Plan:   Post-operative Plan: Extubation in OR  Informed Consent: I have reviewed the patients History and Physical, chart, labs and discussed the procedure including the risks, benefits and alternatives for the proposed  anesthesia with the patient or authorized representative who has indicated his/her understanding and acceptance.   Dental advisory given  Plan Discussed with: CRNA  Anesthesia Plan Comments:        Anesthesia Quick Evaluation

## 2015-11-05 LAB — BASIC METABOLIC PANEL
ANION GAP: 9 (ref 5–15)
BUN: 54 mg/dL — ABNORMAL HIGH (ref 6–20)
CALCIUM: 8.3 mg/dL — AB (ref 8.9–10.3)
CHLORIDE: 108 mmol/L (ref 101–111)
CO2: 18 mmol/L — AB (ref 22–32)
Creatinine, Ser: 2.06 mg/dL — ABNORMAL HIGH (ref 0.44–1.00)
GFR calc non Af Amer: 23 mL/min — ABNORMAL LOW (ref >60)
GFR, EST AFRICAN AMERICAN: 27 mL/min — AB (ref >60)
GLUCOSE: 324 mg/dL — AB (ref 65–99)
Potassium: 4.7 mmol/L (ref 3.5–5.1)
Sodium: 135 mmol/L (ref 135–145)

## 2015-11-05 LAB — GLUCOSE, CAPILLARY
GLUCOSE-CAPILLARY: 279 mg/dL — AB (ref 65–99)
GLUCOSE-CAPILLARY: 326 mg/dL — AB (ref 65–99)
GLUCOSE-CAPILLARY: 403 mg/dL — AB (ref 65–99)
GLUCOSE-CAPILLARY: 411 mg/dL — AB (ref 65–99)
Glucose-Capillary: 225 mg/dL — ABNORMAL HIGH (ref 65–99)
Glucose-Capillary: 311 mg/dL — ABNORMAL HIGH (ref 65–99)

## 2015-11-05 LAB — CBC
HEMATOCRIT: 27 % — AB (ref 36.0–46.0)
HEMOGLOBIN: 8.6 g/dL — AB (ref 12.0–15.0)
MCH: 30.1 pg (ref 26.0–34.0)
MCHC: 31.9 g/dL (ref 30.0–36.0)
MCV: 94.4 fL (ref 78.0–100.0)
Platelets: 222 10*3/uL (ref 150–400)
RBC: 2.86 MIL/uL — ABNORMAL LOW (ref 3.87–5.11)
RDW: 13.8 % (ref 11.5–15.5)
WBC: 8 10*3/uL (ref 4.0–10.5)

## 2015-11-05 MED ORDER — METHOCARBAMOL 500 MG PO TABS
500.0000 mg | ORAL_TABLET | Freq: Four times a day (QID) | ORAL | Status: DC | PRN
  Administered 2015-11-06: 500 mg via ORAL
  Filled 2015-11-05: qty 1

## 2015-11-05 MED ORDER — APIXABAN 2.5 MG PO TABS: 2.5000 mg | ORAL_TABLET | Freq: Two times a day (BID) | ORAL | Status: DC

## 2015-11-05 MED ORDER — DOCUSATE SODIUM 100 MG PO CAPS: 100.0000 mg | ORAL_CAPSULE | Freq: Two times a day (BID) | ORAL | Status: DC

## 2015-11-05 MED ORDER — INSULIN ASPART 100 UNIT/ML ~~LOC~~ SOLN
5.0000 [IU] | Freq: Once | SUBCUTANEOUS | Status: AC
  Administered 2015-11-05: 5 [IU] via SUBCUTANEOUS

## 2015-11-05 MED ORDER — ONDANSETRON HCL 4 MG PO TABS: 4.0000 mg | ORAL_TABLET | Freq: Four times a day (QID) | ORAL | Status: DC | PRN

## 2015-11-05 MED ORDER — SENNA 8.6 MG PO TABS: 2.0000 | ORAL_TABLET | Freq: Every day | ORAL | Status: DC

## 2015-11-05 MED ORDER — HYDROCODONE-ACETAMINOPHEN 5-325 MG PO TABS: 1.0000 | ORAL_TABLET | ORAL | Status: DC | PRN

## 2015-11-05 NOTE — Care Management Note (Signed)
Case Management Note  Patient Details  Name: Cassandra Parker MRN: [id number] Date of Birth: [birth year]  Subjective/Objective:       Left total knee arthroplasty             Action/Plan: NCM spoke to pt and offered choice for [facility]. Pt agreeable to [facility] for [facility]. Pt requesting RW and 3n1 for home. [place], [place] [phone] will be in the home to assist pt. Contacted [facility] for DME for home.   Expected Discharge Date:  11/06/2015               Expected Discharge Plan:  [place]  In-House Referral:  NA  Discharge planning Services  CM Consult  Post Acute Care Choice:  Home Health Choice offered to:  Patient  DME Arranged:  Walker rolling, 3-N-1 DME Agency:  [place]:  PT [place] Agency:  [place]  Status of Service:  Completed, signed off  Medicare Important Message Given:    Date Medicare IM Given:    Medicare IM give by:    Date Additional Medicare IM Given:    Additional Medicare Important Message give by:     If discussed at [place] of Stay Meetings, dates discussed:    Additional Comments:  Erenest Rasher, RN 11/05/2015, 9:51 AM

## 2015-11-05 NOTE — Progress Notes (Signed)
   Subjective:  Patient reports pain as mild to moderate.  Denies N/V/CP/SOB.  Objective:   VITALS:   Filed Vitals:   11/04/15 1903 11/04/15 2005 11/05/15 0100 11/05/15 0605  BP: 144/66 157/77 156/71 148/58  Pulse: 75 88 80 79  Temp: 98.2 F (36.8 C) 97.9 F (36.6 C) 97.5 F (36.4 C) 98.4 F (36.9 C)  TempSrc: Oral Oral Oral Oral  Resp: 15 15 15 15   Height:      Weight:      SpO2: 100% 100% 99% 99%    ABD soft Sensation intact distally Intact pulses distally Dorsiflexion/Plantar flexion intact Incision: dressing C/D/I Compartment soft HV ss  Lab Results  Component Value Date   WBC 8.0 11/05/2015   HGB 8.6* 11/05/2015   HCT 27.0* 11/05/2015   MCV 94.4 11/05/2015   PLT 222 11/05/2015   BMET    Component Value Date/Time   NA 135 11/05/2015 0410   K 4.7 11/05/2015 0410   CL 108 11/05/2015 0410   CO2 18* 11/05/2015 0410   GLUCOSE 324* 11/05/2015 0410   BUN 54* 11/05/2015 0410   CREATININE 2.06* 11/05/2015 0410   CALCIUM 8.3* 11/05/2015 0410   GFRNONAA 23* 11/05/2015 0410   GFRAA 27* 11/05/2015 0410     Assessment/Plan: 1 Day Post-Op   Principal Problem:   Primary osteoarthritis of left knee   WBAT with walker DVT ppx: apixaban, foot pumps, TEDs PO pain control Glucose control PT/OT D/C HV drain Dispo: d/c home after clears therapy, likely tomorrow    Elie Goody 11/05/2015, 7:45 AM   Rod Can, MD Cell [phone]

## 2015-11-05 NOTE — Evaluation (Signed)
Physical Therapy Evaluation Patient Details Name: Cassandra Parker MRN: [id number] DOB: [birth year] Today's Date: 11/05/2015   History of Present Illness  L TKR; hx of R breast CA with mastectomy, CHF, DM, Cardiomyopathy, and pacemaker placement  Clinical Impression  Pt s/p L TKR presents with decreased L LE strength/ROM and post op pain limiting functional mobility.  Pt should progress to dc home with family assist and HHPT follow up.    Follow Up Recommendations Home health PT    Equipment Recommendations  Rolling walker with 5" wheels    Recommendations for Other Services OT consult     Precautions / Restrictions Precautions Precautions: Knee;Fall Restrictions Weight Bearing Restrictions: No Other Position/Activity Restrictions: WBAT      Mobility  Bed Mobility Overal bed mobility: Needs Assistance Bed Mobility: Supine to Sit     Supine to sit: Min assist     General bed mobility comments: cues for sequence and use of R LE to self assist  Transfers Overall transfer level: Needs assistance Equipment used: Rolling walker (2 wheeled) Transfers: Sit to/from Stand Sit to Stand: Min assist         General transfer comment: cues for LE managment and use of UEs to self assist  Ambulation/Gait Ambulation/Gait assistance: Min assist Ambulation Distance (Feet): 61 Feet Assistive device: Rolling walker (2 wheeled) Gait Pattern/deviations: Step-to pattern;Decreased step length - right;Decreased step length - left;Shuffle;Trunk flexed Gait velocity: decr   General Gait Details: cues for sequence, posture and position from [employer]            Wheelchair Mobility    Modified Rankin (Stroke Patients Only)       Balance                                             Pertinent Vitals/Pain Pain Assessment: 0-10 Pain Score: 4  Pain Location: L knee Pain Descriptors / Indicators: Aching;Sore Pain Intervention(s): Limited activity  within patient's tolerance;Monitored during session;Premedicated before session;Ice applied    Home Living Family/patient expects to be discharged to:: Private residence Living Arrangements: Alone Available Help at Discharge: Family Type of Home: House Home Access: Ramped entrance     [place]: One level   Additional Comments: Dtr will assist    Prior Function Level of Independence: Independent               Hand Dominance        Extremity/Trunk Assessment   Upper Extremity Assessment: Overall WFL for tasks assessed           Lower Extremity Assessment: LLE deficits/detail   LLE Deficits / Details: 3/5 quads with IND SLR and AAROM at knee -10 - 75     Communication   Communication: No difficulties  Cognition Arousal/Alertness: Awake/alert Behavior During Therapy: WFL for tasks assessed/performed Overall Cognitive Status: Within Functional Limits for tasks assessed                      General Comments      Exercises Total Joint Exercises Ankle Circles/Pumps: AROM;Both;15 reps;Supine Quad Sets: AROM;Both;10 reps;Supine Heel Slides: AAROM;Left;15 reps;Supine Hip ABduction/ADduction: Supine;Left;AAROM;AROM;15 reps      Assessment/Plan    PT Assessment Patient needs continued PT services  PT Diagnosis Difficulty walking   PT Problem List Decreased strength;Decreased range of motion;Decreased activity tolerance;Decreased mobility;Decreased knowledge of use of  DME;Pain  PT Treatment Interventions DME instruction;Gait training;Functional mobility training;Therapeutic activities;Therapeutic exercise;Patient/family education   PT Goals (Current goals can be found in the Care Plan section) Acute Rehab PT Goals Patient Stated Goal: Regain IND and ambulate without pain PT Goal Formulation: With patient Time For Goal Achievement: 11/09/15 Potential to Achieve Goals: Good    Frequency 7X/week   Barriers to discharge        Co-evaluation                End of Session Equipment Utilized During Treatment: Gait belt Activity Tolerance: Patient tolerated treatment well Patient left: in chair;with call bell/phone within reach;with family/visitor present Nurse Communication: Mobility status         Time: [id number] PT Time Calculation (min) (ACUTE ONLY): 38 min   Charges:   PT Evaluation $PT Eval Low Complexity: 1 Procedure PT Treatments $Gait Training: 8-22 mins $Therapeutic Exercise: 8-22 mins   PT G Codes:        Cassandra Parker 11/28/2015, 12:18 PM

## 2015-11-05 NOTE — Discharge Instructions (Signed)
°Dr. Brian Swinteck °Total Joint Specialist °[facility] °[address] °[place], [place] [address] °[phone] ° °TOTAL KNEE REPLACEMENT POSTOPERATIVE DIRECTIONS ° ° ° °Knee Rehabilitation, Guidelines Following Surgery  °Results after knee surgery are often greatly improved when you follow the exercise, range of motion and muscle strengthening exercises prescribed by your doctor. Safety measures are also important to protect the knee from further injury. Any time any of these exercises cause you to have increased pain or swelling in your knee joint, decrease the amount until you are comfortable again and slowly increase them. If you have problems or questions, call your caregiver or physical therapist for advice.  ° °WEIGHT BEARING °Weight bearing as tolerated with assist device (walker, cane, etc) as directed, use it as long as suggested by your surgeon or therapist, typically at least 4-6 weeks. ° °HOME CARE INSTRUCTIONS  °Remove items at home which could result in a fall. This includes throw rugs or furniture in walking pathways.  °Continue medications as instructed at time of discharge. °You may have some home medications which will be placed on hold until you complete the course of blood thinner medication.  °You may start showering once you are discharged home but do not submerge the incision under water. Just pat the incision dry and apply a dry gauze dressing on daily. °Walk with walker as instructed.  °You may resume a sexual relationship in one month or when given the OK by your doctor.  °· Use walker as long as suggested by your caregivers. °· Avoid periods of inactivity such as sitting longer than an hour when not asleep. This helps prevent blood clots.  °You may put full weight on your legs and walk as much as is comfortable.  °You may return to work once you are cleared by your doctor.  °Do not drive a car for 6 weeks or until released by you surgeon.  °· Do not drive while  taking narcotics.  °Wear the elastic stockings for three weeks following surgery during the day but you may remove then at night. °Make sure you keep all of your appointments after your operation with all of your doctors and caregivers. You should call the office at the above phone number and make an appointment for approximately two weeks after the date of your surgery. °Do not remove your surgical dressing. The dressing is waterproof; you may take showers in 3 days, but do not take tub baths or submerge the dressing. °Please pick up a stool softener and laxative for home use as long as you are requiring pain medications. °· ICE to the affected knee every three hours for 30 minutes at a time and then as needed for pain and swelling.  Continue to use ice on the knee for pain and swelling from surgery. You may notice swelling that will progress down to the foot and ankle.  This is normal after surgery.  Elevate the leg when you are not up walking on it.   °It is important for you to complete the blood thinner medication as prescribed by your doctor. °· Continue to use the breathing machine which will help keep your temperature down.  It is common for your temperature to cycle up and down following surgery, especially at night when you are not up moving around and exerting yourself.  The breathing machine keeps your lungs expanded and your temperature down. ° °RANGE OF MOTION AND STRENGTHENING EXERCISES  °Rehabilitation of the knee is important following a   knee injury or an operation. After just a few days of immobilization, the muscles of the thigh which control the knee become weakened and shrink (atrophy). Knee exercises are designed to build up the tone and strength of the thigh muscles and to improve knee motion. Often times heat used for twenty to thirty minutes before working out will loosen up your tissues and help with improving the range of motion but do not use heat for the first two weeks following  surgery. These exercises can be done on a training (exercise) mat, on the floor, on a table or on a bed. Use what ever works the best and is most comfortable for you Knee exercises include:  Leg Lifts - While your knee is still immobilized in a splint or cast, you can do straight leg raises. Lift the leg to 60 degrees, hold for 3 sec, and slowly lower the leg. Repeat 10-20 times 2-3 times daily. Perform this exercise against resistance later as your knee gets better.  Quad and Hamstring Sets - Tighten up the muscle on the front of the thigh (Quad) and hold for 5-10 sec. Repeat this 10-20 times hourly. Hamstring sets are done by pushing the foot backward against an object and holding for 5-10 sec. Repeat as with quad sets.  A rehabilitation program following serious knee injuries can speed recovery and prevent re-injury in the future due to weakened muscles. Contact your doctor or a physical therapist for more information on knee rehabilitation.   SKILLED REHAB INSTRUCTIONS: If the patient is transferred to a skilled rehab facility following release from the hospital, a list of the current medications will be sent to the facility for the patient to continue.  When discharged from the skilled rehab facility, please have the facility set up the patient's [place] prior to being released. Also, the skilled facility will be responsible for providing the patient with their medications at time of release from the facility to include their pain medication, the muscle relaxants, and their blood thinner medication. If the patient is still at the rehab facility at time of the two week follow up appointment, the skilled rehab facility will also need to assist the patient in arranging follow up appointment in our office and any transportation needs.  MAKE SURE YOU:  Understand these instructions.  Will watch your condition.  Will get help right away if you are not doing well or get worse.     Pick up stool softner and laxative for home use following surgery while on pain medications. Do NOT remove your dressing. You may shower.  Do not take tub baths or submerge incision under water. May shower starting three days after surgery. Please use a clean towel to pat the incision dry following showers. Continue to use ice for pain and swelling after surgery. Do not use any lotions or creams on the incision until instructed by your surgeon.  Information on my medicine - ELIQUIS (apixaban)  This medication education was reviewed with me or my healthcare representative as part of my discharge preparation.  The pharmacist that spoke with me during my hospital stay was:  [facility]  Why was Eliquis prescribed for you? Eliquis was prescribed for you to reduce the risk of blood clots forming after orthopedic surgery.    What do You need to know about Eliquis? Take your Eliquis TWICE DAILY - one tablet in the morning and one tablet in the evening with or without food.  It would be best to take the dose about the same time each day.  If you have difficulty swallowing the tablet whole please discuss with your pharmacist how to take the medication safely.  Take Eliquis exactly as prescribed by your doctor and DO NOT stop taking Eliquis without talking to the doctor who prescribed the medication.  Stopping without other medication to take the place of Eliquis may increase your risk of developing a clot.  After discharge, you should have regular check-up appointments with your healthcare provider that is prescribing your Eliquis.  What do you do if you miss a dose? If a dose of ELIQUIS is not taken at the scheduled time, take it as soon as possible on the same day and twice-daily administration should be resumed.  The dose should not be doubled to make up for a missed dose.  Do not take more than one tablet of ELIQUIS at the same time.  Important Safety Information A  possible side effect of Eliquis is bleeding. You should call your healthcare provider right away if you experience any of the following: ? Bleeding from an injury or your nose that does not stop. ? Unusual colored urine (red or dark brown) or unusual colored stools (red or black). ? Unusual bruising for unknown reasons. ? A serious fall or if you hit your head (even if there is no bleeding).  Some medicines may interact with Eliquis and might increase your risk of bleeding or clotting while on Eliquis. To help avoid this, consult your healthcare provider or pharmacist prior to using any new prescription or non-prescription medications, including herbals, vitamins, non-steroidal anti-inflammatory drugs (NSAIDs) and supplements.  This website has more information on Eliquis (apixaban): http://www.eliquis.com/eliquis/home

## 2015-11-05 NOTE — Progress Notes (Signed)
[facility]    [facility] is providing the following services: RW and Commode  If patient discharges after hours, please call [phone].   Linward Headland 11/05/2015, 10:39 AM

## 2015-11-05 NOTE — Progress Notes (Signed)
11/05/15 Nursing [facility] called reg cbg 411 on patient at this time. Patient ate dinner tray at 730 pm. 5 units novolog insulin ordered.

## 2015-11-05 NOTE — Progress Notes (Signed)
Physical Therapy Treatment Patient Details Name: Cassandra Parker MRN: [id number] DOB: 20-Nov-1940 Today's Date: 11/05/2015    History of Present Illness L TKR; hx of R breast CA with mastectomy, CHF, DM, Cardiomyopathy, and pacemaker placement    PT Comments    Pt motivated and progressing steadily with mobility.  Pt hopeful for dc home tomorrow.  Follow Up Recommendations  Home health PT     Equipment Recommendations  Rolling walker with 5" wheels    Recommendations for Other Services OT consult     Precautions / Restrictions Precautions Precautions: Knee;Fall Restrictions Weight Bearing Restrictions: No Other Position/Activity Restrictions: WBAT    Mobility  Bed Mobility Overal bed mobility: Needs Assistance Bed Mobility: Sit to Supine     Supine to sit: Supervision Sit to supine: Min assist   General bed mobility comments: cues for sequence and use of R LE to self assist  Transfers Overall transfer level: Needs assistance Equipment used: Rolling walker (2 wheeled) Transfers: Sit to/from Stand Sit to Stand: Min guard         General transfer comment: min cues for LE management and use of UEs to self assist  Ambulation/Gait Ambulation/Gait assistance: Min assist Ambulation Distance (Feet): 120 Feet Assistive device: Rolling walker (2 wheeled) Gait Pattern/deviations: Step-to pattern;Decreased step length - right;Shuffle;Trunk flexed Gait velocity: decr   General Gait Details: cues for sequence, posture and position from [employer]            Wheelchair Mobility    Modified Rankin (Stroke Patients Only)       Balance                                    Cognition Arousal/Alertness: Awake/alert Behavior During Therapy: WFL for tasks assessed/performed Overall Cognitive Status: Within Functional Limits for tasks assessed                      Exercises Total Joint Exercises Ankle Circles/Pumps: AROM;Both;15  reps;Supine Quad Sets: AROM;Both;10 reps;Supine Heel Slides: AAROM;Left;15 reps;Supine Hip ABduction/ADduction: Supine;Left;AAROM;AROM;10 reps    General Comments        Pertinent Vitals/Pain Pain Assessment: 0-10 Pain Score: 3  Pain Location: L knee Pain Descriptors / Indicators: Aching;Sore Pain Intervention(s): Monitored during session;Premedicated before session;Limited activity within patient's tolerance;Ice applied    Home Living Family/patient expects to be discharged to:: Private residence Living Arrangements: Alone Available Help at Discharge: Family Type of Home: House Home Access: Ramped entrance   [place]: [address]: None Additional Comments: Dtr will assist    Prior Function Level of Independence: Independent          PT Goals (current goals can now be found in the care plan section) Acute Rehab PT Goals Patient Stated Goal: regain independence PT Goal Formulation: With patient Time For Goal Achievement: 11/09/15 Potential to Achieve Goals: Good Progress towards PT goals: Progressing toward goals    Frequency  7X/week    PT Plan Current plan remains appropriate    Co-evaluation             End of Session Equipment Utilized During Treatment: Gait belt Activity Tolerance: Patient tolerated treatment well Patient left: in bed;with call bell/phone within reach;with family/visitor present     Time: [id number] PT Time Calculation (min) (ACUTE ONLY): 28 min  Charges:  $Gait Training: 8-22 mins $Therapeutic Exercise: 8-22 mins  G Codes:      Jenny Omdahl 11/12/2015, 3:00 PM

## 2015-11-05 NOTE — Progress Notes (Signed)
Occupational Therapy Evaluation Patient Details Name: Cassandra Parker MRN: [id number] DOB: 26-Jun-1941 Today's Date: 11/05/2015    History of Present Illness L TKR; hx of R breast CA with mastectomy, CHF, DM, Cardiomyopathy, and pacemaker placement   Clinical Impression   Patient s/p L TKR presents with decreased ADL independence post-op and will benefit from skilled OT to maximize function. OT will follow for continued education on ADL performance.    Follow Up Recommendations  No OT follow up;Supervision/Assistance - 24 hour    Equipment Recommendations  3 in 1 bedside comode    Recommendations for Other Services       Precautions / Restrictions Precautions Precautions: Knee;Fall Restrictions Weight Bearing Restrictions: No Other Position/Activity Restrictions: WBAT      Mobility Bed Mobility Overal bed mobility: Needs Assistance Bed Mobility: Supine to Sit     Supine to sit: Supervision        Transfers Overall transfer level: Needs assistance Equipment used: Rolling walker (2 wheeled) Transfers: Sit to/from Stand Sit to Stand: Supervision         General transfer comment: 1 cue for hand placement    Balance                                            ADL Overall ADL's : Needs assistance/impaired Eating/Feeding: Independent;Sitting   Grooming: Wash/dry hands;Supervision/safety;Standing   Upper Body Bathing: Set up;Sitting   Lower Body Bathing: Minimal assistance;Sit to/from stand   Upper Body Dressing : Set up;Sitting   Lower Body Dressing: Minimal assistance;Sit to/from stand   Toilet Transfer: Supervision/safety;Ambulation;BSC;RW   Toileting- Clothing Manipulation and Hygiene: Supervision/safety;Sit to/from stand       Functional mobility during ADLs: Supervision/safety;Rolling walker General ADL Comments: Patient willing to work with OT. Patient performed bed mobility, sit to stand to RW, ambulation to/from  bathroom for toileting/grooming task, and up to recliner during session. We discussed LB dressing techniques, including AE available, but patient states daughter can assist with LB self-care tasks for now. Patient has tub'shower; will follow up for tub transfer education next session.     Vision     Perception     Praxis      Pertinent Vitals/Pain Pain Assessment: 0-10 Pain Score: 2  Pain Location: L knee Pain Descriptors / Indicators: Aching;Sore Pain Intervention(s): Limited activity within patient's tolerance;Monitored during session;Repositioned;Ice applied     Hand Dominance Right   Extremity/Trunk Assessment Upper Extremity Assessment Upper Extremity Assessment: Overall WFL for tasks assessed   Lower Extremity Assessment Lower Extremity Assessment: Defer to PT evaluation       Communication Communication Communication: No difficulties   Cognition Arousal/Alertness: Awake/alert Behavior During Therapy: WFL for tasks assessed/performed Overall Cognitive Status: Within Functional Limits for tasks assessed                     General Comments       Exercises       Shoulder Instructions      Home Living Family/patient expects to be discharged to:: Private residence Living Arrangements: Alone Available Help at Discharge: Family Type of Home: House Home Access: Ramped entrance     Home Layout: One level     Bathroom Shower/Tub: Teacher, Cassandra Parker years/pre: Standard Bathroom Accessibility: Yes How Accessible: Accessible via walker Home Equipment: None   Additional Comments: Dtr will assist  Prior Functioning/Environment Level of Independence: Independent             OT Diagnosis: Acute pain   OT Problem List: Decreased strength;Decreased range of motion;Decreased knowledge of use of DME or AE;Pain   OT Treatment/Interventions: Self-care/ADL training;DME and/or AE instruction;Therapeutic activities;Patient/family education     OT Goals(Current goals can be found in the care plan section) Acute Rehab OT Goals Patient Stated Goal: regain independence OT Goal Formulation: With patient Time For Goal Achievement: 11/19/15 Potential to Achieve Goals: Good ADL Goals Pt Will Perform Tub/Shower Transfer: Tub transfer;with min assist;ambulating;3 in 1;rolling walker  OT Frequency: Min 2X/week   Barriers to D/C:            Co-evaluation              End of Session Equipment Utilized During Treatment: Surveyor, mining Communication: Mobility status  Activity Tolerance: Patient tolerated treatment well Patient left: in chair;with call bell/phone within reach;with family/visitor present   Time: 1300-1318 OT Time Calculation (min): 18 min Charges:  OT General Charges $OT Visit: 1 Procedure OT Evaluation $OT Eval Low Complexity: 1 Procedure G-Codes:    Cassandra Parker A 2015/11/06, 1:28 PM

## 2015-11-06 LAB — CBC
HEMATOCRIT: 25.1 % — AB (ref 36.0–46.0)
HEMOGLOBIN: 8.1 g/dL — AB (ref 12.0–15.0)
MCH: 30 pg (ref 26.0–34.0)
MCHC: 32.3 g/dL (ref 30.0–36.0)
MCV: 93 fL (ref 78.0–100.0)
Platelets: 212 10*3/uL (ref 150–400)
RBC: 2.7 MIL/uL — ABNORMAL LOW (ref 3.87–5.11)
RDW: 13.9 % (ref 11.5–15.5)
WBC: 13.6 10*3/uL — AB (ref 4.0–10.5)

## 2015-11-06 LAB — GLUCOSE, CAPILLARY: GLUCOSE-CAPILLARY: 215 mg/dL — AB (ref 65–99)

## 2015-11-06 NOTE — Progress Notes (Signed)
Occupational Therapy Treatment Patient Details Name: Cassandra Parker MRN: [id number] DOB: 12-19-1940 Today's Date: 11/06/2015    History of present illness L TKR; hx of R breast CA with mastectomy, CHF, DM, Cardiomyopathy, and pacemaker placement   OT comments  Pt progressing towards acute OT goals. Focus of session was tub transfer education and transfers to 3n1. ADL education reviewed. Daughter present for session. D/c plan remains appropriate.   Follow Up Recommendations  No OT follow up;Supervision/Assistance - 24 hour    Equipment Recommendations  3 in 1 bedside comode    Recommendations for Other Services      Precautions / Restrictions Precautions Precautions: Knee;Fall Precaution Comments: reviewed Restrictions Weight Bearing Restrictions: No Other Position/Activity Restrictions: WBAT       Mobility Bed Mobility Overal bed mobility: Needs Assistance Bed Mobility: Supine to Sit     Supine to sit: Supervision        Transfers Overall transfer level: Needs assistance Equipment used: Rolling walker (2 wheeled) Transfers: Sit to/from Stand Sit to Stand: Min guard              Balance Overall balance assessment: Needs assistance         Standing balance support: Bilateral upper extremity supported Standing balance-Leahy Scale: Fair Standing balance comment: stood to wash hands at sink                   ADL Overall ADL's : Needs assistance/impaired     Grooming: Wash/dry Radiographer, therapeutic: Supervision/safety;Ambulation;BSC;RW         Tub/Shower Transfer Details (indicate cue type and reason): educated on technique, daughter present. handout provided Functional mobility during ADLs: Supervision/safety;Rolling walker General ADL Comments: Educated on tub transfer technique with handoout utilized. Daughter present. Toilet transfer as detialed above. Reviewed ADL education.        Vision                     Perception     Praxis      Cognition   Behavior During Therapy: WFL for tasks assessed/performed Overall Cognitive Status: Within Functional Limits for tasks assessed                       Extremity/Trunk Assessment               Exercises     Shoulder Instructions       General Comments      Pertinent Vitals/ Pain       Pain Assessment: 0-10 Pain Score: 4  Pain Location: L knee Pain Descriptors / Indicators: Aching;Sore Pain Intervention(s): Limited activity within patient's tolerance;Monitored during session;Repositioned;Ice applied  Home Living                                          Prior Functioning/Environment              Frequency Min 2X/week     Progress Toward Goals  OT Goals(current goals can now be found in the care plan section)  Progress towards OT goals: Progressing toward goals  Acute Rehab OT Goals Patient Stated Goal: regain independence OT Goal Formulation: With patient Time For Goal Achievement: 11/19/15 Potential to Achieve Goals: Good ADL Goals Pt Will Perform  Tub/Shower Transfer: Tub transfer;with min assist;ambulating;3 in 1;rolling walker  Plan Discharge plan remains appropriate    Co-evaluation                 End of Session Equipment Utilized During Treatment: Rolling walker   Activity Tolerance Patient tolerated treatment well   Patient Left in chair;with call bell/phone within reach;with family/visitor present   Nurse Communication          Time: [phone] OT Time Calculation (min): 23 min  Charges: OT General Charges $OT Visit: 1 Procedure OT Treatments $Self Care/Home Management : 23-37 mins  Hortencia Pilar 11/06/2015, 9:50 AM

## 2015-11-06 NOTE — Progress Notes (Signed)
Physical Therapy Treatment Patient Details Name: Cassandra Parker MRN: [id number] DOB: 12/20/1940 Today's Date: 11/06/2015    History of Present Illness L TKR; hx of R breast CA with mastectomy, CHF, DM, Cardiomyopathy, and pacemaker placement    PT Comments    Pt progressing well with mobility and eager for dc home this date.  Follow Up Recommendations  Home health PT     Equipment Recommendations  Rolling walker with 5" wheels    Recommendations for Other Services OT consult     Precautions / Restrictions Precautions Precautions: Knee;Fall Precaution Comments: reviewed Restrictions Weight Bearing Restrictions: No Other Position/Activity Restrictions: WBAT    Mobility  Bed Mobility               General bed mobility comments: NT - OOB with OT  Transfers Overall transfer level: Needs assistance Equipment used: Rolling walker (2 wheeled) Transfers: Sit to/from Stand Sit to Stand: Supervision         General transfer comment: min cues for LE management and use of UEs to self assist  Ambulation/Gait Ambulation/Gait assistance: Min guard;Supervision Ambulation Distance (Feet): 200 Feet Assistive device: Rolling walker (2 wheeled) Gait Pattern/deviations: Decreased step length - right;Decreased step length - left;Shuffle;Trunk flexed;Step-to pattern;Step-through pattern     General Gait Details: cues for sequence, posture and position from [employer] Mobility    Modified Rankin (Stroke Patients Only)       Balance                                    Cognition Arousal/Alertness: Awake/alert Behavior During Therapy: WFL for tasks assessed/performed Overall Cognitive Status: Within Functional Limits for tasks assessed                      Exercises Total Joint Exercises Ankle Circles/Pumps: AROM;Both;15 reps;Supine Quad Sets: AROM;Both;Supine;20 reps Heel Slides: AAROM;Left;Supine;20  reps Straight Leg Raises: AAROM;AROM;Left;20 reps;Supine Long Arc Quad: AROM;10 reps;Seated;Both Goniometric ROM: AAROM L knee -8 - 90    General Comments        Pertinent Vitals/Pain Pain Assessment: 0-10 Pain Score: 4  Pain Location: L knee Pain Descriptors / Indicators: Aching;Sore Pain Intervention(s): Limited activity within patient's tolerance;Monitored during session;Premedicated before session;Ice applied    Home Living                      Prior Function            PT Goals (current goals can now be found in the care plan section) Acute Rehab PT Goals Patient Stated Goal: regain independence PT Goal Formulation: With patient Time For Goal Achievement: 11/09/15 Potential to Achieve Goals: Good Progress towards PT goals: Progressing toward goals    Frequency  7X/week    PT Plan Current plan remains appropriate    Co-evaluation             End of Session Equipment Utilized During Treatment: Gait belt Activity Tolerance: Patient tolerated treatment well Patient left: in chair;with call bell/phone within reach;with family/visitor present     Time: [id number] PT Time Calculation (min) (ACUTE ONLY): 34 min  Charges:  $Gait Training: 8-22 mins $Therapeutic Exercise: 8-22 mins                    G Codes:  [name] 11/06/2015, 1:10 PM

## 2015-11-06 NOTE — Progress Notes (Signed)
Pt discharged to home with family. Escorted to lobby in wheelchair via nursing tech. Pt provided with prescriptions and dc paper work prior to [employer]

## 2015-11-06 NOTE — Progress Notes (Signed)
Subjective: 2 Days Post-Op Procedure(s) (LRB): LEFT TOTAL KNEE ARTHROPLASTY (Left) Patient reports pain as well controlled.  Tolertaing PO's . Progressing with PT.Denies SOB, cP or calf pain. Reports she is ready to D/c home. No lightheadedness is reported.  Objective: Vital signs in last 24 hours: Temp:  [97.7 F (36.5 C)-98.4 F (36.9 C)] 98.4 F (36.9 C) (02/11 0550) Pulse Rate:  [73-82] 73 (02/11 0731) Resp:  [16-20] 16 (02/11 0731) BP: (105-168)/(32-78) 151/63 mmHg (02/11 0731) SpO2:  [95 %-99 %] 98 % (02/11 0550)  Intake/Output from previous day: 02/10 0701 - 02/11 0700 In: 720 [P.O.:720] Out: 650 [Urine:650] Intake/Output this shift: Total I/O In: 360 [P.O.:360] Out: 0    Recent Labs  11/05/15 0410 11/06/15 0421  HGB 8.6* 8.1*    Recent Labs  11/05/15 0410 11/06/15 0421  WBC 8.0 13.6*  RBC 2.86* 2.70*  HCT 27.0* 25.1*  PLT 222 212    Recent Labs  11/05/15 0410  NA 135  K 4.7  CL 108  CO2 18*  BUN 54*  CREATININE 2.06*  GLUCOSE 324*  CALCIUM 8.3*   No results for input(s): LABPT, INR in the last 72 hours.  Alert and oriented x3. RRR, Lungs clear, BS x4. Left Calf soft and non tender. L knee dressing C/D/I. No DVT signs. No signs of infection or compartment syndrome. LLE grossly neurovascularly intact.   Assessment/Plan: 2 Days Post-Op Procedure(s) (LRB): LEFT TOTAL KNEE ARTHROPLASTY (Left) Up with PT D/c home Rx completed F/u with DR. Swinteck in office Follow instructions On [place] HGB stable and pt asymptomatic at 8.1 Raegan Winders L 11/06/2015, 9:01 AM

## 2015-11-07 DIAGNOSIS — I110 Hypertensive heart disease with heart failure: Secondary | ICD-10-CM | POA: Diagnosis not present

## 2015-11-07 DIAGNOSIS — I509 Heart failure, unspecified: Secondary | ICD-10-CM | POA: Diagnosis not present

## 2015-11-07 DIAGNOSIS — E1140 Type 2 diabetes mellitus with diabetic neuropathy, unspecified: Secondary | ICD-10-CM | POA: Diagnosis not present

## 2015-11-07 DIAGNOSIS — I429 Cardiomyopathy, unspecified: Secondary | ICD-10-CM | POA: Diagnosis not present

## 2015-11-07 DIAGNOSIS — Z471 Aftercare following joint replacement surgery: Secondary | ICD-10-CM | POA: Diagnosis not present

## 2015-11-07 DIAGNOSIS — I352 Nonrheumatic aortic (valve) stenosis with insufficiency: Secondary | ICD-10-CM | POA: Diagnosis not present

## 2015-11-08 ENCOUNTER — Encounter: Payer: Commercial Managed Care - HMO | Admitting: Internal Medicine

## 2015-11-09 DIAGNOSIS — I352 Nonrheumatic aortic (valve) stenosis with insufficiency: Secondary | ICD-10-CM | POA: Diagnosis not present

## 2015-11-09 DIAGNOSIS — Z471 Aftercare following joint replacement surgery: Secondary | ICD-10-CM | POA: Diagnosis not present

## 2015-11-09 DIAGNOSIS — I429 Cardiomyopathy, unspecified: Secondary | ICD-10-CM | POA: Diagnosis not present

## 2015-11-09 DIAGNOSIS — E1140 Type 2 diabetes mellitus with diabetic neuropathy, unspecified: Secondary | ICD-10-CM | POA: Diagnosis not present

## 2015-11-09 DIAGNOSIS — I509 Heart failure, unspecified: Secondary | ICD-10-CM | POA: Diagnosis not present

## 2015-11-09 DIAGNOSIS — I110 Hypertensive heart disease with heart failure: Secondary | ICD-10-CM | POA: Diagnosis not present

## 2015-11-11 DIAGNOSIS — Z471 Aftercare following joint replacement surgery: Secondary | ICD-10-CM | POA: Diagnosis not present

## 2015-11-11 DIAGNOSIS — I509 Heart failure, unspecified: Secondary | ICD-10-CM | POA: Diagnosis not present

## 2015-11-11 DIAGNOSIS — I352 Nonrheumatic aortic (valve) stenosis with insufficiency: Secondary | ICD-10-CM | POA: Diagnosis not present

## 2015-11-11 DIAGNOSIS — I110 Hypertensive heart disease with heart failure: Secondary | ICD-10-CM | POA: Diagnosis not present

## 2015-11-11 DIAGNOSIS — E1140 Type 2 diabetes mellitus with diabetic neuropathy, unspecified: Secondary | ICD-10-CM | POA: Diagnosis not present

## 2015-11-11 DIAGNOSIS — I429 Cardiomyopathy, unspecified: Secondary | ICD-10-CM | POA: Diagnosis not present

## 2015-11-15 DIAGNOSIS — I429 Cardiomyopathy, unspecified: Secondary | ICD-10-CM | POA: Diagnosis not present

## 2015-11-15 DIAGNOSIS — E1140 Type 2 diabetes mellitus with diabetic neuropathy, unspecified: Secondary | ICD-10-CM | POA: Diagnosis not present

## 2015-11-15 DIAGNOSIS — I110 Hypertensive heart disease with heart failure: Secondary | ICD-10-CM | POA: Diagnosis not present

## 2015-11-15 DIAGNOSIS — Z471 Aftercare following joint replacement surgery: Secondary | ICD-10-CM | POA: Diagnosis not present

## 2015-11-15 DIAGNOSIS — I352 Nonrheumatic aortic (valve) stenosis with insufficiency: Secondary | ICD-10-CM | POA: Diagnosis not present

## 2015-11-15 DIAGNOSIS — I509 Heart failure, unspecified: Secondary | ICD-10-CM | POA: Diagnosis not present

## 2015-11-17 DIAGNOSIS — I509 Heart failure, unspecified: Secondary | ICD-10-CM | POA: Diagnosis not present

## 2015-11-17 DIAGNOSIS — E1140 Type 2 diabetes mellitus with diabetic neuropathy, unspecified: Secondary | ICD-10-CM | POA: Diagnosis not present

## 2015-11-17 DIAGNOSIS — I110 Hypertensive heart disease with heart failure: Secondary | ICD-10-CM | POA: Diagnosis not present

## 2015-11-17 DIAGNOSIS — I429 Cardiomyopathy, unspecified: Secondary | ICD-10-CM | POA: Diagnosis not present

## 2015-11-17 DIAGNOSIS — Z471 Aftercare following joint replacement surgery: Secondary | ICD-10-CM | POA: Diagnosis not present

## 2015-11-17 DIAGNOSIS — I352 Nonrheumatic aortic (valve) stenosis with insufficiency: Secondary | ICD-10-CM | POA: Diagnosis not present

## 2015-11-22 DIAGNOSIS — Z471 Aftercare following joint replacement surgery: Secondary | ICD-10-CM | POA: Diagnosis not present

## 2015-11-22 DIAGNOSIS — Z96652 Presence of left artificial knee joint: Secondary | ICD-10-CM | POA: Diagnosis not present

## 2015-11-24 DIAGNOSIS — M1712 Unilateral primary osteoarthritis, left knee: Secondary | ICD-10-CM | POA: Diagnosis not present

## 2015-11-26 DIAGNOSIS — M1712 Unilateral primary osteoarthritis, left knee: Secondary | ICD-10-CM | POA: Diagnosis not present

## 2015-11-30 DIAGNOSIS — M1712 Unilateral primary osteoarthritis, left knee: Secondary | ICD-10-CM | POA: Diagnosis not present

## 2015-12-02 DIAGNOSIS — M1712 Unilateral primary osteoarthritis, left knee: Secondary | ICD-10-CM | POA: Diagnosis not present

## 2015-12-03 ENCOUNTER — Encounter: Payer: Self-pay | Admitting: Internal Medicine

## 2015-12-03 ENCOUNTER — Ambulatory Visit (INDEPENDENT_AMBULATORY_CARE_PROVIDER_SITE_OTHER): Payer: Commercial Managed Care - HMO | Admitting: Internal Medicine

## 2015-12-03 VITALS — BP 124/64 | HR 100 | Ht 63.0 in | Wt 211.0 lb

## 2015-12-03 DIAGNOSIS — I5022 Chronic systolic (congestive) heart failure: Secondary | ICD-10-CM

## 2015-12-03 NOTE — Patient Instructions (Signed)
Your physician wants you to follow-up in: 1 Year with Dr. Lovena Le.  You will receive a reminder letter in the mail two months in advance. If you don't receive a letter, please call our office to schedule the follow-up appointment.  Your physician recommends that you continue on your current medications as directed. Please refer to the Current Medication list given to you today.  Remote monitoring is used to monitor your Pacemaker of ICD from home. This monitoring reduces the number of office visits required to check your device to one time per year. It allows [place] to keep an eye on the functioning of your device to ensure it is working properly. You are scheduled for a device check from home in 3 months. You may send your transmission at any time that day. If you have a wireless device, the transmission will be sent automatically. After your physician reviews your transmission, you will receive a postcard with your next transmission date.  If you need a refill on your cardiac medications before your next appointment, please call your pharmacy.  Thank you for choosing [facility]!

## 2015-12-03 NOTE — Progress Notes (Signed)
HPI Mrs. Coghill returns today for followup. She is a pleasant 75 yo woman with a h/o complete heart block and chronic systolic heart failure, EF 35% who underwent BiV PM insertion over a year ago. Repeat echo 4 months ago demonstrated normalization of her LV function. In the interim, the patient reports feeling well. She denies chest pain or sob. No syncope or peripheral edema. She has undergone knee surgery.  No Known Allergies   Current Outpatient Prescriptions  Medication Sig Dispense Refill  . apixaban (ELIQUIS) 2.5 MG TABS tablet Take 1 tablet (2.5 mg total) by mouth every 12 (twelve) hours. 60 tablet 0  . carvedilol (COREG) 6.25 MG tablet Take 0.5 tablets (3.125 mg total) by mouth 2 (two) times daily. 30 tablet 4  . chlorthalidone (HYGROTON) 25 MG tablet Take 12.5 mg by mouth daily.    . Cholecalciferol (VITAMIN D) 2000 UNITS tablet Take 2,000 Units by mouth daily.     Marland Kitchen glipiZIDE (GLUCOTROL XL) 5 MG 24 hr tablet Take 5 mg by mouth 2 (two) times daily.     . pravastatin (PRAVACHOL) 10 MG tablet Take 10 mg by mouth at bedtime.     . senna (SENOKOT) 8.6 MG TABS tablet Take 2 tablets (17.2 mg total) by mouth at bedtime. 60 each 3  . docusate sodium (COLACE) 100 MG capsule Take 1 capsule (100 mg total) by mouth 2 (two) times daily. (Patient not taking: Reported on 12/03/2015) 60 capsule 3  . HYDROcodone-acetaminophen (NORCO) 5-325 MG tablet Take 1-2 tablets by mouth every 4 (four) hours as needed for moderate pain. (Patient not taking: Reported on 12/03/2015) 90 tablet 0   No current facility-administered medications for this visit.     Past Medical History  Diagnosis Date  . Hypertension   . Hypercholesterolemia   . Diabetes mellitus     with neuropathy  . Cancer [facility]) 2008    right breast  . Cardiomyopathy ([place])     dating back to 2010  . CHF (congestive heart failure) ([place]) pt stated she was recently diagnosed in another hospital with heart failure and a "weak heart"  .  Presence of permanent cardiac pacemaker   . Arthritis   . History of blood transfusion   . Tingling     feet bilat     ROS:   All systems reviewed and negative except as noted in the HPI.   Past Surgical History  Procedure Laterality Date  . Mastectomy  2008     mmh-right  . Knee arthroscopy      right-mmh  . Cholecystectomy      mmh  . Appendectomy      mmh  . Cataract extraction w/phaco  06/29/2011    Procedure: CATARACT EXTRACTION PHACO AND INTRAOCULAR LENS PLACEMENT (IOC);  Surgeon: Tonny Branch;  Location: [facility];  Service: Ophthalmology;  Laterality: Right;  CDE: 14.41  . Cataract extraction w/phaco  09/04/2011    Procedure: CATARACT EXTRACTION PHACO AND INTRAOCULAR LENS PLACEMENT (IOC);  Surgeon: Tonny Branch;  Location: [facility];  Service: Ophthalmology;  Laterality: Left;  CDE: 15.21  . Cardiomyopathy    . Temporary pacemaker insertion N/A 10/13/2013    Procedure: TEMPORARY PACEMAKER INSERTION;  Surgeon: Jolaine Artist, MD;  [facility] LAB;  Service: Cardiovascular;  Laterality: N/A;  . Bi-ventricular pacemaker insertion N/A 10/15/2013    Procedure: BI-VENTRICULAR PACEMAKER INSERTION (CRT-P);  Surgeon: Evans Lance, MD;  Location: [facility] CATH LAB;  Service: Cardiovascular;  Laterality:  N/A;  . Total knee arthroplasty Left 11/04/2015    Procedure: LEFT TOTAL KNEE ARTHROPLASTY;  Surgeon: Rod Can, MD;  Location: [facility];  Service: Orthopedics;  Laterality: Left;     Family History  Problem Relation Age of Onset  . Anesthesia problems Neg Hx   . Hypotension Neg Hx   . Malignant hyperthermia Neg Hx   . Pseudochol deficiency Neg Hx      Social History   Social History  . Marital Status: Widowed    Spouse Name: N/A  . Number of Children: N/A  . Years of Education: N/A   Occupational History  . Not on file.   Social History Main Topics  . Smoking status: Never Smoker   . Smokeless tobacco: Never Used  . Alcohol Use: No  . Drug Use: No  . Sexual  Activity: Yes    Birth Control/ Protection: Post-menopausal   Other Topics Concern  . Not on file   Social History Narrative     BP 124/64 mmHg  Pulse 100  Ht 5\' 3"  (1.6 m)  Wt 211 lb (95.709 kg)  BMI 37.39 kg/m2  SpO2 93%  Physical Exam:  Well appearing 75 yo woman, NAD HEENT: Unremarkable Neck:  6 cm JVD, no thyromegally Back:  No CVA tenderness Lungs:  Clear with no wheezes HEART:  Regular rate rhythm, no murmurs, no rubs, no clicks Abd:  soft, obese, positive bowel sounds, no organomegally, no rebound, no guarding Ext:  2 plus pulses, no edema, no cyanosis, no clubbing Skin:  No rashes no nodules Neuro:  CN II through XII intact, motor grossly intact   DEVICE  Normal device function.  See PaceArt for details.   Assess/Plan: 1. Chronic systolic heart failure - she remains class 1 and had normalization of her LV function after biv pacing. She will continue her current meds and maintain a low sodium diet. 2. 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 Will follow.  3. PPM - Her [employer] biv PPM is working normally. Will recheck in several months.  Mikle Bosworth.D.

## 2015-12-07 DIAGNOSIS — M1712 Unilateral primary osteoarthritis, left knee: Secondary | ICD-10-CM | POA: Diagnosis not present

## 2015-12-10 DIAGNOSIS — M1712 Unilateral primary osteoarthritis, left knee: Secondary | ICD-10-CM | POA: Diagnosis not present

## 2015-12-14 ENCOUNTER — Emergency Department (HOSPITAL_COMMUNITY): Payer: Commercial Managed Care - HMO

## 2015-12-14 ENCOUNTER — Emergency Department (HOSPITAL_COMMUNITY)
Admission: EM | Admit: 2015-12-14 | Discharge: 2015-12-15 | Disposition: A | Discharge status: Home / Self Care | Payer: Commercial Managed Care - HMO | Attending: Emergency Medicine | Admitting: Emergency Medicine

## 2015-12-14 ENCOUNTER — Encounter (HOSPITAL_COMMUNITY): Payer: Self-pay | Admitting: *Deleted

## 2015-12-14 DIAGNOSIS — R1013 Epigastric pain: Secondary | ICD-10-CM | POA: Insufficient documentation

## 2015-12-14 DIAGNOSIS — E119 Type 2 diabetes mellitus without complications: Secondary | ICD-10-CM | POA: Insufficient documentation

## 2015-12-14 DIAGNOSIS — Z79899 Other long term (current) drug therapy: Secondary | ICD-10-CM | POA: Diagnosis not present

## 2015-12-14 DIAGNOSIS — Z7982 Long term (current) use of aspirin: Secondary | ICD-10-CM | POA: Insufficient documentation

## 2015-12-14 DIAGNOSIS — Z853 Personal history of malignant neoplasm of breast: Secondary | ICD-10-CM | POA: Diagnosis not present

## 2015-12-14 DIAGNOSIS — Z950 Presence of cardiac pacemaker: Secondary | ICD-10-CM | POA: Diagnosis not present

## 2015-12-14 DIAGNOSIS — I110 Hypertensive heart disease with heart failure: Secondary | ICD-10-CM | POA: Diagnosis not present

## 2015-12-14 DIAGNOSIS — M1712 Unilateral primary osteoarthritis, left knee: Secondary | ICD-10-CM | POA: Diagnosis not present

## 2015-12-14 DIAGNOSIS — I509 Heart failure, unspecified: Secondary | ICD-10-CM | POA: Insufficient documentation

## 2015-12-14 DIAGNOSIS — R112 Nausea with vomiting, unspecified: Secondary | ICD-10-CM | POA: Insufficient documentation

## 2015-12-14 LAB — URINALYSIS, ROUTINE W REFLEX MICROSCOPIC
Glucose, UA: 100 mg/dL — AB
HGB URINE DIPSTICK: NEGATIVE
Nitrite: NEGATIVE
Protein, ur: 100 mg/dL — AB
SPECIFIC GRAVITY, URINE: 1.02 (ref 1.005–1.030)
pH: 5 (ref 5.0–8.0)

## 2015-12-14 LAB — URINE MICROSCOPIC-ADD ON

## 2015-12-14 LAB — COMPREHENSIVE METABOLIC PANEL
ALBUMIN: 3.9 g/dL (ref 3.5–5.0)
ALK PHOS: 94 U/L (ref 38–126)
ALT: 12 U/L — AB (ref 14–54)
AST: 15 U/L (ref 15–41)
Anion gap: 9 (ref 5–15)
BUN: 34 mg/dL — ABNORMAL HIGH (ref 6–20)
CHLORIDE: 100 mmol/L — AB (ref 101–111)
CO2: 27 mmol/L (ref 22–32)
CREATININE: 2.07 mg/dL — AB (ref 0.44–1.00)
Calcium: 8.9 mg/dL (ref 8.9–10.3)
GFR calc Af Amer: 26 mL/min — ABNORMAL LOW (ref >60)
GFR calc non Af Amer: 23 mL/min — ABNORMAL LOW (ref >60)
GLUCOSE: 156 mg/dL — AB (ref 65–99)
Potassium: 3.8 mmol/L (ref 3.5–5.1)
SODIUM: 136 mmol/L (ref 135–145)
Total Bilirubin: 0.3 mg/dL (ref 0.3–1.2)
Total Protein: 8 g/dL (ref 6.5–8.1)

## 2015-12-14 LAB — LIPASE, BLOOD: Lipase: 47 U/L (ref 11–51)

## 2015-12-14 LAB — CBC WITH DIFFERENTIAL/PLATELET
BASOS PCT: 0 %
Basophils Absolute: 0 10*3/uL (ref 0.0–0.1)
EOS ABS: 0.3 10*3/uL (ref 0.0–0.7)
Eosinophils Relative: 4 %
HEMATOCRIT: 31.6 % — AB (ref 36.0–46.0)
HEMOGLOBIN: 9.9 g/dL — AB (ref 12.0–15.0)
LYMPHS ABS: 3.2 10*3/uL (ref 0.7–4.0)
Lymphocytes Relative: 34 %
MCH: 29 pg (ref 26.0–34.0)
MCHC: 31.3 g/dL (ref 30.0–36.0)
MCV: 92.7 fL (ref 78.0–100.0)
Monocytes Absolute: 0.7 10*3/uL (ref 0.1–1.0)
Monocytes Relative: 7 %
NEUTROS ABS: 5.3 10*3/uL (ref 1.7–7.7)
NEUTROS PCT: 55 %
Platelets: 308 10*3/uL (ref 150–400)
RBC: 3.41 MIL/uL — AB (ref 3.87–5.11)
RDW: 13.4 % (ref 11.5–15.5)
WBC: 9.5 10*3/uL (ref 4.0–10.5)

## 2015-12-14 MED ORDER — SODIUM CHLORIDE 0.9 % IV BOLUS (SEPSIS)
1000.0000 mL | Freq: Once | INTRAVENOUS | Status: AC
  Administered 2015-12-15: 1000 mL via INTRAVENOUS

## 2015-12-14 MED ORDER — GI COCKTAIL ~~LOC~~
30.0000 mL | Freq: Once | ORAL | Status: AC
  Administered 2015-12-15: 30 mL via ORAL
  Filled 2015-12-14: qty 30

## 2015-12-14 MED ORDER — ONDANSETRON HCL 4 MG/2ML IJ SOLN
4.0000 mg | Freq: Once | INTRAMUSCULAR | Status: AC
  Administered 2015-12-15: 4 mg via INTRAVENOUS
  Filled 2015-12-14: qty 2

## 2015-12-14 NOTE — ED Provider Notes (Signed)
CSN: [id number]     Arrival date & time 12/14/15  2010 History  By signing my name below, I, Cassandra Parker, attest that this documentation has been prepared under the direction and in the presence of physician practitioner, Rolland Porter, MD at 2335. Electronically Signed: [name], Scribe. 12/14/2015. 11:05 PM.      Chief Complaint  Patient presents with  . Abdominal Pain    The history is provided by the patient. No language interpreter was used.     HPI Comments: Cassandra Parker is a 75 y.o. female with h/o HTN, hypercholesterolemia, DM, cancer, CHF, and cardiomyopathy who presents to the Emergency Department complaining of sudden onset, intermittent, severe, aching, burning, epigastric pain for the last 3-4 days. She notes abdominal pain exacerbation with eating any food followed by vomiting. She states that her abdominal pain will present for around an hour and is relieved by vomiting. She endorses pain exacerbation with palpation to her epigastric region and her LUQ. She notes that her emesis is mostly regurgitated food. She also endorses mild fever, diarrhea, nausea, vomiting, and weakness as well. Pt notes that she had multiple episodes of diarrhea 3 days ago and has not experienced diarrhea since. Pt had a left knee replacement performed by Dr. Rex Kras 6 weeks ago and believes her pain medication caused her diarrhea. She notes that she has been taking her pain medication irregularly for the last 1-2 weeks. She regularly attends physical therapy for her left knee. Pt lives with her grandson. She uses a cane to ambulate since her surgery. She has not had similar abdominal issues in the past. She has PSHx of cholecystectomy and appendectomy. Pt does not take medication for stomach acid and has never been treated for this issue. Pt has been taking nausea medication with relief. She denies acid regurgitation, lower abdominal pain, chills, dizziness, lightheadedness, or any other associated  symptoms.  PCP: Dr. Quintin Alto  Past Medical History  Diagnosis Date  . Hypertension   . Hypercholesterolemia   . Diabetes mellitus     with neuropathy  . Cancer [facility]) 2008    right breast  . Cardiomyopathy ([place])     dating back to 2010  . CHF (congestive heart failure) ([place]) pt stated she was recently diagnosed in another hospital with heart failure and a "weak heart"  . Presence of permanent cardiac pacemaker   . Arthritis   . History of blood transfusion   . Tingling     feet bilat    Past Surgical History  Procedure Laterality Date  . Mastectomy  2008     mmh-right  . Knee arthroscopy      right-mmh  . Cholecystectomy      mmh  . Appendectomy      mmh  . Cataract extraction w/phaco  06/29/2011    Procedure: CATARACT EXTRACTION PHACO AND INTRAOCULAR LENS PLACEMENT (IOC);  Surgeon: Tonny Branch;  Location: [facility];  Service: Ophthalmology;  Laterality: Right;  CDE: 14.41  . Cataract extraction w/phaco  09/04/2011    Procedure: CATARACT EXTRACTION PHACO AND INTRAOCULAR LENS PLACEMENT (IOC);  Surgeon: Tonny Branch;  Location: [facility];  Service: Ophthalmology;  Laterality: Left;  CDE: 15.21  . Cardiomyopathy    . Temporary pacemaker insertion N/A 10/13/2013    Procedure: TEMPORARY PACEMAKER INSERTION;  Surgeon: Jolaine Artist, MD;  Location: [facility] CATH LAB;  Service: Cardiovascular;  Laterality: N/A;  . Bi-ventricular pacemaker insertion N/A 10/15/2013    Procedure: BI-VENTRICULAR PACEMAKER INSERTION (CRT-P);  Surgeon:  Evans Lance, MD;  Location: [facility] CATH LAB;  Service: Cardiovascular;  Laterality: N/A;  . Total knee arthroplasty Left 11/04/2015    Procedure: LEFT TOTAL KNEE ARTHROPLASTY;  Surgeon: Rod Can, MD;  Location: [facility];  Service: Orthopedics;  Laterality: Left;   Family History  Problem Relation Age of Onset  . Anesthesia problems Neg Hx   . Hypotension Neg Hx   . Malignant hyperthermia Neg Hx   . Pseudochol deficiency Neg Hx    Social History  Substance Use  Topics  . Smoking status: Never Smoker   . Smokeless tobacco: Never Used  . Alcohol Use: No   Lives at home Lives with Grandson  OB History    No data available     Review of Systems  Constitutional: Positive for fever. Negative for chills.  Gastrointestinal: Positive for nausea, vomiting, abdominal pain and diarrhea.       No lower abdominal pain  Neurological: Positive for weakness. Negative for dizziness and light-headedness.  All other systems reviewed and are negative.     Allergies  Review of patient's allergies indicates no known allergies.  Home Medications   Prior to Admission medications   Medication Sig Start Date End Date Taking? Authorizing Provider  acetaminophen (TYLENOL) 500 MG tablet Take 1,000 mg by mouth 2 (two) times daily.   Yes Historical Provider, MD  Azilsartan Medoxomil (EDARBI) 80 MG TABS Take 1 tablet by mouth daily.   Yes Historical Provider, MD  carvedilol (COREG) 6.25 MG tablet Take 0.5 tablets (3.125 mg total) by mouth 2 (two) times daily. 10/16/13  Yes Brooke O Edmisten, PA-C  chlorthalidone (HYGROTON) 25 MG tablet Take 12.5 mg by mouth daily.   Yes Historical Provider, MD  Cholecalciferol (VITAMIN D) 2000 UNITS tablet Take 2,000 Units by mouth daily.    Yes Historical Provider, MD  glipiZIDE (GLUCOTROL XL) 5 MG 24 hr tablet Take 5 mg by mouth 2 (two) times daily.    Yes Historical Provider, MD  Omega-3 Fatty Acids (FISH OIL) 1200 MG CAPS Take 1 capsule by mouth daily.   Yes Historical Provider, MD  pravastatin (PRAVACHOL) 10 MG tablet Take 10 mg by mouth at bedtime.    Yes Historical Provider, MD  senna (SENOKOT) 8.6 MG TABS tablet Take 2 tablets (17.2 mg total) by mouth at bedtime. 11/05/15  Yes Rod Can, MD  traMADol (ULTRAM) 50 MG tablet Take 50 mg by mouth every 6 (six) hours as needed. for pain 11/18/15  Yes Historical Provider, MD  apixaban (ELIQUIS) 2.5 MG TABS tablet Take 1 tablet (2.5 mg total) by mouth every 12 (twelve)  hours. Patient not taking: Reported on 12/14/2015 11/05/15   Rod Can, MD  aspirin EC 81 MG tablet Take 81 mg by mouth daily.    Historical Provider, MD  docusate sodium (COLACE) 100 MG capsule Take 1 capsule (100 mg total) by mouth 2 (two) times daily. Patient not taking: Reported on 12/03/2015 11/05/15   Rod Can, MD  HYDROcodone-acetaminophen [facility]) 5-325 MG tablet Take 1-2 tablets by mouth every 4 (four) hours as needed for moderate pain. Patient not taking: Reported on 12/03/2015 11/05/15   Rod Can, MD  omeprazole (PRILOSEC) 20 MG capsule Take 1 po BID x 2 weeks then once a day 12/15/15   Rolland Porter, MD  ondansetron (ZOFRAN) 4 MG tablet Take 1 tablet (4 mg total) by mouth every 8 (eight) hours as needed for nausea or vomiting. 12/15/15   Rolland Porter, MD   BP 178/70  mmHg  Pulse 84  Temp(Src) 98.9 F (37.2 C) (Temporal)  Resp 16  Ht 5\' 3"  (1.6 m)  Wt 220 lb (99.791 kg)  BMI 38.98 kg/m2  SpO2 100%  Vital signs normal except for hypertension  Physical Exam  Constitutional: She is oriented to person, place, and time. She appears well-developed and well-nourished.  Non-toxic appearance. She does not appear ill. No distress.  HENT:  Head: Normocephalic and atraumatic.  Right Ear: External ear normal.  Left Ear: External ear normal.  Nose: Nose normal. No mucosal edema or rhinorrhea.  Mouth/Throat: Oropharynx is clear and moist and mucous membranes are normal. No dental abscesses or uvula swelling.  Eyes: Conjunctivae and EOM are normal. Pupils are equal, round, and reactive to light.  Neck: Normal range of motion and full passive range of motion without pain. Neck supple.  Cardiovascular: Normal rate, regular rhythm and normal heart sounds.  Exam reveals no gallop and no friction rub.   No murmur heard. Pulmonary/Chest: Effort normal and breath sounds normal. No respiratory distress. She has no wheezes. She has no rhonchi. She has no rales. She exhibits no tenderness and no  crepitus.  Abdominal: Soft. Normal appearance and bowel sounds are normal. She exhibits no distension. There is tenderness. There is no rebound and no guarding.    Tender epigastric and left upper quadrant  Musculoskeletal: Normal range of motion. She exhibits no edema or tenderness.  Moves all extremities well.   Neurological: She is alert and oriented to person, place, and time. She has normal strength. No cranial nerve deficit.  Skin: Skin is warm, dry and intact. No rash noted. No erythema. No pallor.  Psychiatric: She has a normal mood and affect. Her speech is normal and behavior is normal. Her mood appears not anxious.  Nursing note and vitals reviewed.   ED Course  Procedures (including critical care time)  Medications  sodium chloride 0.9 % bolus 1,000 mL (0 mLs Intravenous Stopped 12/15/15 0159)  ondansetron (ZOFRAN) injection 4 mg (4 mg Intravenous Given 12/15/15 0036)  gi cocktail (Maalox,Lidocaine,Donnatal) (30 mLs Oral Given 12/15/15 0036)  sodium chloride 0.9 % bolus 500 mL (0 mLs Intravenous Stopped 12/15/15 0256)     DIAGNOSTIC STUDIES: Oxygen Saturation is 100% on RA, normal by my interpretation.    COORDINATION OF CARE: 11:06 PM Discussed treatment plan with pt at bedside and pt agreed to plan.Patient was given IV fluids, IV nausea medicine and a GI cocktail.  Patient was rechecked at 1:45 AM. She states she's feeling improved. She has not had urinary output yet. She was given an additional 500 mL of normal saline. She feels like she can try oral fluids.  The patient was able to tolerate fluids without making her abdominal pain return or make her nauseated. She was discharged home on Prilosec and given Zofran for nausea and vomiting. She was advised be rechecked if she got worse.  Labs Review Results for orders placed or performed during the hospital encounter of 12/14/15  CBC with Differential  Result Value Ref Range   WBC 9.5 4.0 - 10.5 K/uL   RBC 3.41 (L) 3.87  - 5.11 MIL/uL   Hemoglobin 9.9 (L) 12.0 - 15.0 g/dL   HCT 31.6 (L) 36.0 - 46.0 %   MCV 92.7 78.0 - 100.0 fL   MCH 29.0 26.0 - 34.0 pg   MCHC 31.3 30.0 - 36.0 g/dL   RDW 13.4 11.5 - 15.5 %   Platelets 308 150 - 400 K/uL  Neutrophils Relative % 55 %   Neutro Abs 5.3 1.7 - 7.7 K/uL   Lymphocytes Relative 34 %   Lymphs Abs 3.2 0.7 - 4.0 K/uL   Monocytes Relative 7 %   Monocytes Absolute 0.7 0.1 - 1.0 K/uL   Eosinophils Relative 4 %   Eosinophils Absolute 0.3 0.0 - 0.7 K/uL   Basophils Relative 0 %   Basophils Absolute 0.0 0.0 - 0.1 K/uL  Comprehensive metabolic panel  Result Value Ref Range   Sodium 136 135 - 145 mmol/L   Potassium 3.8 3.5 - 5.1 mmol/L   Chloride 100 (L) 101 - 111 mmol/L   CO2 27 22 - 32 mmol/L   Glucose, Bld 156 (H) 65 - 99 mg/dL   BUN 34 (H) 6 - 20 mg/dL   Creatinine, Ser 2.07 (H) 0.44 - 1.00 mg/dL   Calcium 8.9 8.9 - 10.3 mg/dL   Total Protein 8.0 6.5 - 8.1 g/dL   Albumin 3.9 3.5 - 5.0 g/dL   AST 15 15 - 41 U/L   ALT 12 (L) 14 - 54 U/L   Alkaline Phosphatase 94 38 - 126 U/L   Total Bilirubin 0.3 0.3 - 1.2 mg/dL   GFR calc non Af Amer 23 (L) >60 mL/min   GFR calc Af Amer 26 (L) >60 mL/min   Anion gap 9 5 - 15  Lipase, blood  Result Value Ref Range   Lipase 47 11 - 51 U/L  Urinalysis, Routine w reflex microscopic (not at [facility])  Result Value Ref Range   Color, Urine YELLOW YELLOW   APPearance CLEAR CLEAR   Specific Gravity, Urine 1.020 1.005 - 1.030   pH 5.0 5.0 - 8.0   Glucose, UA 100 (A) NEGATIVE mg/dL   Hgb urine dipstick NEGATIVE NEGATIVE   Bilirubin Urine SMALL (A) NEGATIVE   Ketones, ur TRACE (A) NEGATIVE mg/dL   Protein, ur 100 (A) NEGATIVE mg/dL   Nitrite NEGATIVE NEGATIVE   Leukocytes, UA TRACE (A) NEGATIVE  Urine microscopic-add on  Result Value Ref Range   Squamous Epithelial / LPF 6-30 (A) NONE SEEN   WBC, UA 0-5 0 - 5 WBC/hpf   RBC / HPF 0-5 0 - 5 RBC/hpf   Bacteria, UA FEW (A) NONE SEEN   Laboratory interpretation all normal  except stable CRI, improving anemia after TKR, hyperglycemia   Imaging Review Dg Abd Acute W/chest  12/15/2015  CLINICAL DATA:  Initial evaluation for acute epigastric pain, nausea, diarrhea, symptomatic for 1 week. EXAM: DG ABDOMEN ACUTE W/ 1V CHEST COMPARISON:  Prior study from 10/16/2013. FINDINGS: Cardiomegaly stable from prior. Left-sided transvenous pacemaker/ AICD unchanged. Mediastinal silhouette within normal limits. Lungs are normally inflated. No focal infiltrate, pulmonary edema, or pleural effusion. No pneumothorax. Bowel gas pattern within normal limits without evidence for obstruction or ileus. No abnormal bowel wall thickening. No free air. No soft tissue mass or abnormal calcification. Scatter calcified fluid was overlie the pelvis. Cholecystectomy clips noted. Advanced degenerative changes noted within the lower lumbar spine. No acute osseus abnormality. IMPRESSION: 1. Nonobstructive bowel gas pattern with no radiographic evidence for acute intra-abdominal process. 2. Stable cardiomegaly.  No active cardiopulmonary disease. Electronically Signed   By: Jeannine Boga M.D.   On: 12/15/2015 01:22   I have personally reviewed and evaluated these images and lab results as part of my medical decision-making.    MDM   Final diagnoses:  Epigastric abdominal pain  Nausea and vomiting, vomiting of unspecified type   Discharge Medication List  as of 12/15/2015  2:48 AM    START taking these medications   Details  omeprazole (PRILOSEC) 20 MG capsule Take 1 po BID x 2 weeks then once a day, Print    ondansetron (ZOFRAN) 4 MG tablet Take 1 tablet (4 mg total) by mouth every 8 (eight) hours as needed for nausea or vomiting., Starting 12/15/2015, Until Discontinued, Print        Plan discharge  Rolland Porter, MD, FACEP   I personally performed the services described in this documentation, which was scribed in my presence. The recorded information has been reviewed and  considered.  Rolland Porter, MD, Barbette Or, MD 12/15/15 [phone]

## 2015-12-14 NOTE — ED Notes (Signed)
Pt c/o abdominal pain and n/v x 1 week. Pt states she is unable to see her PCP until Thursday.

## 2015-12-14 NOTE — ED Notes (Signed)
Patient ambulated to restroom with no assistance.  

## 2015-12-15 DIAGNOSIS — R1013 Epigastric pain: Secondary | ICD-10-CM | POA: Diagnosis not present

## 2015-12-15 MED ORDER — OMEPRAZOLE 20 MG PO CPDR: DELAYED_RELEASE_CAPSULE | ORAL | Status: DC

## 2015-12-15 MED ORDER — ONDANSETRON HCL 4 MG PO TABS: 4.0000 mg | ORAL_TABLET | Freq: Three times a day (TID) | ORAL | Status: DC | PRN

## 2015-12-15 MED ORDER — SODIUM CHLORIDE 0.9 % IV BOLUS (SEPSIS)
500.0000 mL | Freq: Once | INTRAVENOUS | Status: AC
  Administered 2015-12-15: 500 mL via INTRAVENOUS

## 2015-12-15 NOTE — ED Notes (Signed)
Pt drank Diet Sprite with no further vomiting.

## 2015-12-15 NOTE — ED Notes (Signed)
Patient resting with eyes closed at this time. States that GI cocktail helped her stomach.

## 2015-12-15 NOTE — Discharge Instructions (Signed)
Drink plenty of fluids. Avoid fried, spicy, or greasy foods for the next week. Eat a bland diet today, Wednesday. That would be toast, crackers, Jell-O, [employer] chicken noodle soup. Use the Zofran for nausea or vomiting. Take the as directed. Recheck if your pain gets worse or you start having uncontrolled vomiting.x

## 2015-12-15 NOTE — ED Notes (Signed)
Pt states understanding of care given and follow up instructions.  Taken to car in wheelchair.  Pt was able to ambulate to wheelchair and from wheelchair to the toilet on own.

## 2015-12-16 DIAGNOSIS — K2970 Gastritis, unspecified, without bleeding: Secondary | ICD-10-CM | POA: Diagnosis not present

## 2015-12-16 DIAGNOSIS — R112 Nausea with vomiting, unspecified: Secondary | ICD-10-CM | POA: Diagnosis not present

## 2015-12-17 DIAGNOSIS — M1712 Unilateral primary osteoarthritis, left knee: Secondary | ICD-10-CM | POA: Diagnosis not present

## 2015-12-20 DIAGNOSIS — Z471 Aftercare following joint replacement surgery: Secondary | ICD-10-CM | POA: Diagnosis not present

## 2015-12-20 DIAGNOSIS — Z96652 Presence of left artificial knee joint: Secondary | ICD-10-CM | POA: Diagnosis not present

## 2015-12-20 LAB — CUP PACEART INCLINIC DEVICE CHECK
Date Time Interrogation Session: 20170327140834
Implantable Lead Location: 753858
Implantable Lead Location: 753859
MDC IDC LEAD IMPLANT DT: 20150121
MDC IDC LEAD IMPLANT DT: 20150121
MDC IDC LEAD IMPLANT DT: 20150121
MDC IDC LEAD LOCATION: 753860
MDC IDC PG SERIAL: 7557114
Pulse Gen Model: 3242

## 2016-01-04 DIAGNOSIS — N183 Chronic kidney disease, stage 3 (moderate): Secondary | ICD-10-CM | POA: Diagnosis not present

## 2016-01-04 DIAGNOSIS — I10 Essential (primary) hypertension: Secondary | ICD-10-CM | POA: Diagnosis not present

## 2016-01-04 DIAGNOSIS — E7800 Pure hypercholesterolemia, unspecified: Secondary | ICD-10-CM | POA: Diagnosis not present

## 2016-01-04 DIAGNOSIS — E1121 Type 2 diabetes mellitus with diabetic nephropathy: Secondary | ICD-10-CM | POA: Diagnosis not present

## 2016-01-05 ENCOUNTER — Encounter: Payer: Self-pay | Admitting: Cardiovascular Disease

## 2016-01-05 ENCOUNTER — Ambulatory Visit (INDEPENDENT_AMBULATORY_CARE_PROVIDER_SITE_OTHER): Payer: Commercial Managed Care - HMO | Admitting: Cardiovascular Disease

## 2016-01-05 VITALS — BP 148/88 | HR 74 | Ht 63.0 in | Wt 215.0 lb

## 2016-01-05 DIAGNOSIS — E785 Hyperlipidemia, unspecified: Secondary | ICD-10-CM | POA: Diagnosis not present

## 2016-01-05 DIAGNOSIS — I10 Essential (primary) hypertension: Secondary | ICD-10-CM

## 2016-01-05 DIAGNOSIS — Z950 Presence of cardiac pacemaker: Secondary | ICD-10-CM | POA: Diagnosis not present

## 2016-01-05 DIAGNOSIS — I429 Cardiomyopathy, unspecified: Secondary | ICD-10-CM | POA: Diagnosis not present

## 2016-01-05 DIAGNOSIS — I050 Rheumatic mitral stenosis: Secondary | ICD-10-CM

## 2016-01-05 MED ORDER — VALSARTAN 160 MG PO TABS: 160.0000 mg | ORAL_TABLET | Freq: Every day | ORAL | Status: DC

## 2016-01-05 NOTE — Patient Instructions (Addendum)
Your physician has recommended you make the following change in your medication:  Stop edarbi. Start diovan (valsartan) 160 mg daily. Continue all other medications the same. Your physician recommends that you schedule a follow-up appointment in: 1&1/2 years. You will receive a reminder letter in the mail in about 16 months reminding you to call and schedule your appointment. If you don't receive this letter, please contact our office.

## 2016-01-05 NOTE — Progress Notes (Signed)
Patient ID: [name], female   DOB: [birth year], 75 y.o.   MRN: [id number]      SUBJECTIVE: The patient presents for routine follow-up. She has a history of hypertension, hyperlipidemia, breast cancer, insulin-dependent diabetes mellitus, and a cardiomyopathy. She was hospitalized in 2015 for a syncopal episode and was found to be in complete heart block. An echocardiogram at that time revealed an EF of 35-40%. She ultimately underwent placement of a BiV pacemaker. She also had moderate mitral stenosis with a mean gradient of 7 mmHg.  A repeat echocardiogram on 06/04/2014 demonstrated normalization of left ventricular systolic function, EF 0000000, grade 1 diastolic dysfunction, elevated filling pressures, moderate left atrial dilatation, and mild mitral stenosis with mean gradient of 3 mm mercury.  The patient denies any symptoms of chest pain, palpitations, shortness of breath, lightheadedness, dizziness, leg swelling, orthopnea, PND, and syncope.   Review of Systems: As per "subjective", otherwise negative.  No Known Allergies  Current Outpatient Prescriptions  Medication Sig Dispense Refill  . acetaminophen (TYLENOL) 500 MG tablet Take 1,000 mg by mouth 2 (two) times daily.    Marland Kitchen aspirin EC 81 MG tablet Take 81 mg by mouth daily.    . Azilsartan Medoxomil (EDARBI) 80 MG TABS Take 1 tablet by mouth daily.    . carvedilol (COREG) 6.25 MG tablet Take 0.5 tablets (3.125 mg total) by mouth 2 (two) times daily. 30 tablet 4  . chlorthalidone (HYGROTON) 25 MG tablet Take 12.5 mg by mouth daily.    . Cholecalciferol (VITAMIN D) 2000 UNITS tablet Take 2,000 Units by mouth daily.     Marland Kitchen glipiZIDE (GLUCOTROL XL) 5 MG 24 hr tablet Take 5 mg by mouth 2 (two) times daily.     . Omega-3 Fatty Acids (FISH OIL) 1200 MG CAPS Take 1 capsule by mouth daily.    . pravastatin (PRAVACHOL) 10 MG tablet Take 10 mg by mouth at bedtime.      No current facility-administered medications for this visit.     Past Medical History  Diagnosis Date  . Hypertension   . Hypercholesterolemia   . Diabetes mellitus     with neuropathy  . Cancer [facility]) 2008    right breast  . Cardiomyopathy ([place])     dating back to 2010  . CHF (congestive heart failure) ([place]) pt stated she was recently diagnosed in another hospital with heart failure and a "weak heart"  . Presence of permanent cardiac pacemaker   . Arthritis   . History of blood transfusion   . Tingling     feet bilat     Past Surgical History  Procedure Laterality Date  . Mastectomy  2008     mmh-right  . Knee arthroscopy      right-mmh  . Cholecystectomy      mmh  . Appendectomy      mmh  . Cataract extraction w/phaco  06/29/2011    Procedure: CATARACT EXTRACTION PHACO AND INTRAOCULAR LENS PLACEMENT (IOC);  Surgeon: Tonny Branch;  Location: [facility] ORS;  Service: Ophthalmology;  Laterality: Right;  CDE: 14.41  . Cataract extraction w/phaco  09/04/2011    Procedure: CATARACT EXTRACTION PHACO AND INTRAOCULAR LENS PLACEMENT (IOC);  Surgeon: Tonny Branch;  Location: [facility] ORS;  Service: Ophthalmology;  Laterality: Left;  CDE: 15.21  . Cardiomyopathy    . Temporary pacemaker insertion N/A 10/13/2013    Procedure: TEMPORARY PACEMAKER INSERTION;  Surgeon: Jolaine Artist, MD;  Location: [facility] CATH LAB;  Service: Cardiovascular;  Laterality: N/A;  .  Bi-ventricular pacemaker insertion N/A 10/15/2013    Procedure: BI-VENTRICULAR PACEMAKER INSERTION (CRT-P);  Surgeon: Evans Lance, MD;  Location: [facility] CATH LAB;  Service: Cardiovascular;  Laterality: N/A;  . Total knee arthroplasty Left 11/04/2015    Procedure: LEFT TOTAL KNEE ARTHROPLASTY;  Surgeon: Rod Can, MD;  Location: [facility] ORS;  Service: Orthopedics;  Laterality: Left;    Social History   Social History  . Marital Status: Widowed    Spouse Name: N/A  . Number of Children: N/A  . Years of Education: N/A   Occupational History  . Not on file.   Social History Main Topics  . Smoking status:  Never Smoker   . Smokeless tobacco: Never Used  . Alcohol Use: No  . Drug Use: No  . Sexual Activity: Yes    Birth Control/ Protection: Post-menopausal   Other Topics Concern  . Not on file   Social History Narrative     Filed Vitals:   01/05/16 0852  BP: 148/88  Pulse: 74  Height: 5\' 3"  (1.6 m)  Weight: 215 lb (97.523 kg)  SpO2: 97%    PHYSICAL EXAM General: NAD HEENT: Normal. Neck: No JVD, no thyromegaly. Lungs: Clear to auscultation bilaterally with normal respiratory effort. CV: Nondisplaced PMI. Regular rate and rhythm, normal S1/S2, no [id number], I/VI systolic murmur along left sternal border. Trace left pretibial edema.  Abdomen: Soft, obese, no distention.  Neurologic: Alert and oriented x 3.  Psych: Normal affect. Skin: Normal. Musculoskeletal: No gross deformities. Extremities: No clubbing or cyanosis.   ECG: Most recent ECG reviewed.      ASSESSMENT AND PLAN: 1. S/p BiV PPM for complete heart block and cardiomyopathy with pronounced paradoxical septal motion: Stable with normal device function. Follows with Dr. Lovena Le.   2. Cardiomyopathy: Normalization of LV systolic function by echo on 06/04/14. Currently on Coreg and ARB.  3. Essential HTN: Elevated. For cost purposes, I will switch Edarbi to valsartan 160 mg daily.  4. Mild mitral stenosis: No signs of heart failure at present. Mean gradient 3 mmHg. Continue current therapy. Monitor for atrial arrhythmias.   Dispo: f/u 1.5 years.   Kate Sable, M.D., F.A.C.C.

## 2016-01-13 DIAGNOSIS — I10 Essential (primary) hypertension: Secondary | ICD-10-CM | POA: Diagnosis not present

## 2016-01-13 DIAGNOSIS — E1121 Type 2 diabetes mellitus with diabetic nephropathy: Secondary | ICD-10-CM | POA: Diagnosis not present

## 2016-01-13 DIAGNOSIS — E1165 Type 2 diabetes mellitus with hyperglycemia: Secondary | ICD-10-CM | POA: Diagnosis not present

## 2016-01-13 DIAGNOSIS — M1712 Unilateral primary osteoarthritis, left knee: Secondary | ICD-10-CM | POA: Diagnosis not present

## 2016-01-13 DIAGNOSIS — E782 Mixed hyperlipidemia: Secondary | ICD-10-CM | POA: Diagnosis not present

## 2016-01-13 DIAGNOSIS — N183 Chronic kidney disease, stage 3 (moderate): Secondary | ICD-10-CM | POA: Diagnosis not present

## 2016-03-06 ENCOUNTER — Encounter: Payer: Commercial Managed Care - HMO | Admitting: *Deleted

## 2016-03-06 ENCOUNTER — Telehealth: Payer: Self-pay | Admitting: Cardiology

## 2016-03-06 ENCOUNTER — Ambulatory Visit (INDEPENDENT_AMBULATORY_CARE_PROVIDER_SITE_OTHER): Payer: Commercial Managed Care - HMO | Admitting: *Deleted

## 2016-03-06 DIAGNOSIS — I442 Atrioventricular block, complete: Secondary | ICD-10-CM | POA: Diagnosis not present

## 2016-03-06 DIAGNOSIS — Z950 Presence of cardiac pacemaker: Secondary | ICD-10-CM | POA: Diagnosis not present

## 2016-03-06 DIAGNOSIS — I429 Cardiomyopathy, unspecified: Secondary | ICD-10-CM

## 2016-03-06 LAB — CUP PACEART REMOTE DEVICE CHECK
Brady Statistic AP VS Percent: 1 %
Brady Statistic AS VP Percent: 99 %
Date Time Interrogation Session: 20170612145039
Implantable Lead Implant Date: 20150121
Implantable Lead Location: 753859
Lead Channel Impedance Value: 440 Ohm
Lead Channel Impedance Value: 510 Ohm
Lead Channel Sensing Intrinsic Amplitude: 5 mV
Lead Channel Setting Pacing Amplitude: 2 V
Lead Channel Setting Pacing Amplitude: 2.25 V
Lead Channel Setting Pacing Amplitude: 2.5 V
Lead Channel Setting Pacing Pulse Width: 0.5 ms
MDC IDC LEAD IMPLANT DT: 20150121
MDC IDC LEAD IMPLANT DT: 20150121
MDC IDC LEAD LOCATION: 753858
MDC IDC LEAD LOCATION: 753860
MDC IDC MSMT BATTERY REMAINING LONGEVITY: 85 mo
MDC IDC MSMT BATTERY REMAINING PERCENTAGE: 95.5 %
MDC IDC MSMT BATTERY VOLTAGE: 2.98 V
MDC IDC MSMT LEADCHNL LV IMPEDANCE VALUE: 400 Ohm
MDC IDC PG MODEL: 3242
MDC IDC PG SERIAL: 7557114
MDC IDC SET LEADCHNL RV PACING PULSEWIDTH: 0.5 ms
MDC IDC SET LEADCHNL RV SENSING SENSITIVITY: 2 mV
MDC IDC STAT BRADY AP VP PERCENT: 1 %
MDC IDC STAT BRADY AS VS PERCENT: 1 %
MDC IDC STAT BRADY RA PERCENT PACED: 1 %

## 2016-03-06 NOTE — Telephone Encounter (Signed)
Spoke with pt and reminded pt of remote transmission that is due today. Pt verbalized understanding.   

## 2016-03-10 ENCOUNTER — Encounter: Payer: Self-pay | Admitting: Cardiology

## 2016-03-15 NOTE — Progress Notes (Signed)
Remote pacemaker transmission.   

## 2016-03-16 ENCOUNTER — Encounter: Payer: Self-pay | Admitting: Cardiology

## 2016-03-20 ENCOUNTER — Telehealth: Payer: Self-pay | Admitting: *Deleted

## 2016-03-20 NOTE — Telephone Encounter (Signed)
Spoke w/ pt and informed her that we received her remote transmission on 03-13-16. Pt verbalized understanding.

## 2016-03-20 NOTE — Telephone Encounter (Signed)
PATIENT IS CALLING IN. SHE STATES SHE ATTEMPTING TO TRANSMIT A DOWNLOAD OF BI-V PACER  INFORMATION PER PATIENT SHE DOES NOT THINK IT IS WORKING. SHE STATES SHE HAS BEEN TRYING FOR @30 -40 MIN AND THE MACHINE LIGHT IS STILL BLINKING. RN INFORMED PATIENT WILL SEND REQUEST  TO DEVICE CLINIC TO ASSIST HER IN THE TRANSMISSION  DEFER TO DEVICE CLINIC PATIENT VERBALIZED UNDERSTANDING

## 2016-05-10 DIAGNOSIS — I10 Essential (primary) hypertension: Secondary | ICD-10-CM | POA: Diagnosis not present

## 2016-05-10 DIAGNOSIS — E1165 Type 2 diabetes mellitus with hyperglycemia: Secondary | ICD-10-CM | POA: Diagnosis not present

## 2016-05-10 DIAGNOSIS — N183 Chronic kidney disease, stage 3 (moderate): Secondary | ICD-10-CM | POA: Diagnosis not present

## 2016-05-10 DIAGNOSIS — E782 Mixed hyperlipidemia: Secondary | ICD-10-CM | POA: Diagnosis not present

## 2016-05-10 DIAGNOSIS — M1712 Unilateral primary osteoarthritis, left knee: Secondary | ICD-10-CM | POA: Diagnosis not present

## 2016-05-16 DIAGNOSIS — N184 Chronic kidney disease, stage 4 (severe): Secondary | ICD-10-CM | POA: Diagnosis not present

## 2016-05-16 DIAGNOSIS — I10 Essential (primary) hypertension: Secondary | ICD-10-CM | POA: Diagnosis not present

## 2016-05-16 DIAGNOSIS — Z6841 Body Mass Index (BMI) 40.0 and over, adult: Secondary | ICD-10-CM | POA: Diagnosis not present

## 2016-05-16 DIAGNOSIS — M1712 Unilateral primary osteoarthritis, left knee: Secondary | ICD-10-CM | POA: Diagnosis not present

## 2016-05-16 DIAGNOSIS — E782 Mixed hyperlipidemia: Secondary | ICD-10-CM | POA: Diagnosis not present

## 2016-05-16 DIAGNOSIS — E1121 Type 2 diabetes mellitus with diabetic nephropathy: Secondary | ICD-10-CM | POA: Diagnosis not present

## 2016-05-16 DIAGNOSIS — Z23 Encounter for immunization: Secondary | ICD-10-CM | POA: Diagnosis not present

## 2016-05-16 DIAGNOSIS — E1165 Type 2 diabetes mellitus with hyperglycemia: Secondary | ICD-10-CM | POA: Diagnosis not present

## 2016-06-14 ENCOUNTER — Ambulatory Visit (INDEPENDENT_AMBULATORY_CARE_PROVIDER_SITE_OTHER): Payer: Commercial Managed Care - HMO | Admitting: *Deleted

## 2016-06-14 DIAGNOSIS — I442 Atrioventricular block, complete: Secondary | ICD-10-CM | POA: Diagnosis not present

## 2016-06-14 NOTE — Progress Notes (Signed)
Remote pacemaker transmission.   

## 2016-06-16 ENCOUNTER — Encounter: Payer: Self-pay | Admitting: Cardiology

## 2016-07-05 ENCOUNTER — Other Ambulatory Visit: Payer: Self-pay | Admitting: *Deleted

## 2016-07-05 NOTE — Patient Outreach (Signed)
[facility]) Care Management  07/05/2016  Cassandra Parker 23-Mar-1941 [id number]  EMMI-Prevent referral:  Telephone call to patient who was advised of reason for call & of [facility] care management services.  HIPPA verification received.   Patient states she does not currently have any health care concerns. States most recent MD appointment was in September 2017.  Voices that she is independent in her care & drives self to primary care & heart specialist appointments. . Voices that she has no problems obtaining medication & manages them well & takes as prescribed by MD.    Patient has declined [place] does not wish to receive contact information via mail.   Plan: Close case. Send MD closure letter.  Sherrin Daisy, RN BSN CCM Care Management Coordinator [facility] Care Management  [phone] .

## 2016-07-06 ENCOUNTER — Encounter: Payer: Self-pay | Admitting: *Deleted

## 2016-07-11 LAB — CUP PACEART REMOTE DEVICE CHECK
Battery Remaining Longevity: 82 mo
Battery Remaining Percentage: 95.5 %
Battery Voltage: 2.96 V
Date Time Interrogation Session: 20170920074226
Implantable Lead Implant Date: 20150121
Implantable Lead Location: 753859
Lead Channel Pacing Threshold Amplitude: 0.5 V
Lead Channel Pacing Threshold Pulse Width: 0.5 ms
Lead Channel Pacing Threshold Pulse Width: 0.5 ms
Lead Channel Pacing Threshold Pulse Width: 0.5 ms
Lead Channel Sensing Intrinsic Amplitude: 5 mV
Lead Channel Setting Pacing Amplitude: 2 V
Lead Channel Setting Pacing Amplitude: 2.25 V
Lead Channel Setting Pacing Amplitude: 2.5 V
Lead Channel Setting Pacing Pulse Width: 0.5 ms
MDC IDC LEAD IMPLANT DT: 20150121
MDC IDC LEAD IMPLANT DT: 20150121
MDC IDC LEAD LOCATION: 753858
MDC IDC LEAD LOCATION: 753860
MDC IDC MSMT LEADCHNL LV IMPEDANCE VALUE: 410 Ohm
MDC IDC MSMT LEADCHNL LV PACING THRESHOLD AMPLITUDE: 1.25 V
MDC IDC MSMT LEADCHNL RA IMPEDANCE VALUE: 440 Ohm
MDC IDC MSMT LEADCHNL RV IMPEDANCE VALUE: 550 Ohm
MDC IDC MSMT LEADCHNL RV PACING THRESHOLD AMPLITUDE: 0.75 V
MDC IDC MSMT LEADCHNL RV SENSING INTR AMPL: 12 mV
MDC IDC PG MODEL: 3242
MDC IDC PG SERIAL: 7557114
MDC IDC SET LEADCHNL RV PACING PULSEWIDTH: 0.5 ms
MDC IDC SET LEADCHNL RV SENSING SENSITIVITY: 2 mV
MDC IDC STAT BRADY AP VP PERCENT: 2.7 %
MDC IDC STAT BRADY AP VS PERCENT: 1 %
MDC IDC STAT BRADY AS VP PERCENT: 97 %
MDC IDC STAT BRADY AS VS PERCENT: 1 %
MDC IDC STAT BRADY RA PERCENT PACED: 2.6 %

## 2016-08-13 ENCOUNTER — Emergency Department (HOSPITAL_COMMUNITY)
Admission: EM | Admit: 2016-08-13 | Discharge: 2016-08-13 | Disposition: A | Discharge status: Home / Self Care | Payer: Commercial Managed Care - HMO | Attending: Emergency Medicine | Admitting: Emergency Medicine

## 2016-08-13 ENCOUNTER — Encounter (HOSPITAL_COMMUNITY): Payer: Self-pay | Admitting: Emergency Medicine

## 2016-08-13 ENCOUNTER — Emergency Department (HOSPITAL_COMMUNITY): Payer: Commercial Managed Care - HMO

## 2016-08-13 DIAGNOSIS — I110 Hypertensive heart disease with heart failure: Secondary | ICD-10-CM | POA: Diagnosis not present

## 2016-08-13 DIAGNOSIS — Z7984 Long term (current) use of oral hypoglycemic drugs: Secondary | ICD-10-CM | POA: Insufficient documentation

## 2016-08-13 DIAGNOSIS — Z853 Personal history of malignant neoplasm of breast: Secondary | ICD-10-CM | POA: Diagnosis not present

## 2016-08-13 DIAGNOSIS — Z79899 Other long term (current) drug therapy: Secondary | ICD-10-CM | POA: Insufficient documentation

## 2016-08-13 DIAGNOSIS — M5441 Lumbago with sciatica, right side: Secondary | ICD-10-CM | POA: Insufficient documentation

## 2016-08-13 DIAGNOSIS — I509 Heart failure, unspecified: Secondary | ICD-10-CM | POA: Diagnosis not present

## 2016-08-13 DIAGNOSIS — Z7982 Long term (current) use of aspirin: Secondary | ICD-10-CM | POA: Diagnosis not present

## 2016-08-13 DIAGNOSIS — M545 Low back pain: Secondary | ICD-10-CM | POA: Diagnosis not present

## 2016-08-13 DIAGNOSIS — E119 Type 2 diabetes mellitus without complications: Secondary | ICD-10-CM | POA: Diagnosis not present

## 2016-08-13 DIAGNOSIS — M5431 Sciatica, right side: Secondary | ICD-10-CM

## 2016-08-13 DIAGNOSIS — M25551 Pain in right hip: Secondary | ICD-10-CM | POA: Diagnosis present

## 2016-08-13 MED ORDER — OXYCODONE-ACETAMINOPHEN 5-325 MG PO TABS
1.0000 | ORAL_TABLET | Freq: Once | ORAL | Status: AC
  Administered 2016-08-13: 1 via ORAL
  Filled 2016-08-13: qty 1

## 2016-08-13 MED ORDER — OXYCODONE-ACETAMINOPHEN 5-325 MG PO TABS: 1.0000 | ORAL_TABLET | ORAL | 0 refills | Status: DC | PRN

## 2016-08-13 NOTE — ED Notes (Signed)
Patient returned from xray.

## 2016-08-13 NOTE — ED Triage Notes (Signed)
Pt reports R hip pain with radiation down her leg that started 2 days ago. No known injury.

## 2016-08-13 NOTE — ED Provider Notes (Signed)
[facility] DEPT Provider Note   CSN: [id number] Arrival date & time: 08/13/16  1045  By signing my name below, I, [name], attest that this documentation has been prepared under the direction and in the presence of Evalee Jefferson, PA-C Electronically Signed: [name], ED Scribe. 08/13/16. 12:17 PM.   History   Chief Complaint Chief Complaint  Patient presents with  . Hip Pain    HPI Cassandra Parker is a 75 y.o. female with a PMHx of arthritis, DM, HTN, who presents to the Emergency Department complaining of right hip pain onset 2 days ago worsening yesterday. Pt notes that her right hip pain radiates to her right ankle upon standing. Pt states that her right hip pain is worsened with ambulation and prolonged sitting. Pt denies alleviating factors for her right hip pain. Pt denies recent injury, falls, or trauma. Pt denies having a hx of back pain in the past. Pt is having associated symptoms of gait problem due to pain. She notes that she has tried tylenol and Rx hydrocodone for the relief of her symptoms. She denies color change, wound, rash, abdominal pain, diarrhea, weakness, numbness, and any other symptoms. Pt reports that she had a left knee surgery in February 2017. Denies allergies to medications. Pt states that her blood sugar this morning was 126. Pt reports that she has a follow up appointment with her PCP in December 2017.   The history is provided by the patient. No language interpreter was used.    Past Medical History:  Diagnosis Date  . Arthritis   . Cancer [facility]) 2008   right breast  . Cardiomyopathy ([place])    dating back to 2010  . CHF (congestive heart failure) ([place]) pt stated she was recently diagnosed in another hospital with heart failure and a "weak heart"  . Diabetes mellitus    with neuropathy  . History of blood transfusion   . Hypercholesterolemia   . Hypertension   . Presence of permanent cardiac pacemaker   . Tingling    feet bilat      Patient Active Problem List   Diagnosis Date Noted  . Primary osteoarthritis of left knee 11/04/2015  . Pacemaker 10/23/2014  . Acute renal insufficiency 10/15/2013  . Mitral stenosis- moderate with trivial MR  10/15/2013  . CHB (complete heart block) ([place]) 10/13/2013  . Syncope 10/13/2013  . HTN (hypertension) 10/13/2013  . Diabetes mellitus type 2, controlled, without complications ([place]) 78/93/8101  . Cancer of breast- s/p chemo and Rt mastectomy 2008 10/13/2013  . Cardiomyopathy- EF 35-40% (etiology not yet determined) 10/13/2013  . Dyslipidemia 10/13/2013    Past Surgical History:  Procedure Laterality Date  . APPENDECTOMY     mmh  . BI-VENTRICULAR PACEMAKER INSERTION N/A 10/15/2013   Procedure: BI-VENTRICULAR PACEMAKER INSERTION (CRT-P);  Surgeon: Evans Lance, MD;  Location: [facility] CATH LAB;  Service: Cardiovascular;  Laterality: N/A;  . Cardiomyopathy    . CATARACT EXTRACTION W/PHACO  06/29/2011   Procedure: CATARACT EXTRACTION PHACO AND INTRAOCULAR LENS PLACEMENT (IOC);  Surgeon: Tonny Branch;  Location: [facility]S;  Service: Ophthalmology;  Laterality: Right;  CDE: 14.41  . CATARACT EXTRACTION W/PHACO  09/04/2011   Procedure: CATARACT EXTRACTION PHACO AND INTRAOCULAR LENS PLACEMENT (IOC);  Surgeon: Tonny Branch;  Location: [facility] ORS;  Service: Ophthalmology;  Laterality: Left;  CDE: 15.21  . CHOLECYSTECTOMY     mmh  . KNEE ARTHROSCOPY     right-mmh  . MASTECTOMY  2008    mmh-right  .  TEMPORARY PACEMAKER INSERTION N/A 10/13/2013   Procedure: TEMPORARY PACEMAKER INSERTION;  Surgeon: Jolaine Artist, MD;  Location: [facility] CATH LAB;  Service: Cardiovascular;  Laterality: N/A;  . TOTAL KNEE ARTHROPLASTY Left 11/04/2015   Procedure: LEFT TOTAL KNEE ARTHROPLASTY;  Surgeon: Rod Can, MD;  Location: [facility] ORS;  Service: Orthopedics;  Laterality: Left;    OB History    Gravida Para Term Preterm AB Living   3 3 3          SAB TAB Ectopic Multiple Live Births                   Home  Medications    Prior to Admission medications   Medication Sig Start Date End Date Taking? Authorizing Provider  acetaminophen (TYLENOL) 500 MG tablet Take 1,000 mg by mouth 2 (two) times daily.   Yes Historical Provider, MD  aspirin EC 81 MG tablet Take 81 mg by mouth daily.   Yes Historical Provider, MD  carvedilol (COREG) 6.25 MG tablet Take 0.5 tablets (3.125 mg total) by mouth 2 (two) times daily. 10/16/13  Yes Brooke O Edmisten, PA-C  chlorthalidone (HYGROTON) 25 MG tablet Take 12.5 mg by mouth daily.   Yes Historical Provider, MD  Cholecalciferol (VITAMIN D) 2000 UNITS tablet Take 2,000 Units by mouth daily.    Yes Historical Provider, MD  glipiZIDE (GLUCOTROL XL) 5 MG 24 hr tablet Take 5 mg by mouth 2 (two) times daily.    Yes Historical Provider, MD  Omega-3 Fatty Acids (FISH OIL) 1200 MG CAPS Take 1 capsule by mouth daily.   Yes Historical Provider, MD  pioglitazone (ACTOS) 15 MG tablet Take 15 mg by mouth daily.   Yes Historical Provider, MD  pravastatin (PRAVACHOL) 10 MG tablet Take 10 mg by mouth at bedtime.    Yes Historical Provider, MD  valsartan (DIOVAN) 160 MG tablet Take 1 tablet (160 mg total) by mouth daily. 01/05/16  Yes Herminio Commons, MD  oxyCODONE-acetaminophen (PERCOCET/ROXICET) 5-325 MG tablet Take 1 tablet by mouth every 4 (four) hours as needed. 08/13/16   Evalee Jefferson, PA-C    Family History Family History  Problem Relation Age of Onset  . Hypertension Father   . Diabetes Father   . Anesthesia problems Neg Hx   . Hypotension Neg Hx   . Malignant hyperthermia Neg Hx   . Pseudochol deficiency Neg Hx     Social History Social History  Substance Use Topics  . Smoking status: Never Smoker  . Smokeless tobacco: Never Used  . Alcohol use No     Allergies   Patient has no known allergies.   Review of Systems Review of Systems  Gastrointestinal: Negative for abdominal pain and diarrhea.  Musculoskeletal: Positive for arthralgias (right hip) and gait  problem (due to pain).  Skin: Negative for color change, rash and wound.  Neurological: Negative for weakness and numbness.   Physical Exam Updated Vital Signs BP 176/61 (BP Location: Left Arm)   Pulse 68   Temp 97.7 F (36.5 C) (Oral)   Resp 16   Ht 5\' 3"  (1.6 m)   Wt 216 lb (98 kg)   SpO2 98%   BMI 38.26 kg/m   Physical Exam  Constitutional: She appears well-developed and well-nourished.  HENT:  Head: Normocephalic.  Eyes: Conjunctivae are normal.  Neck: Normal range of motion. Neck supple.  Cardiovascular: Normal rate and intact distal pulses.   Pedal pulses normal.  Pulmonary/Chest: Effort normal.  Abdominal: Soft. Bowel sounds are  normal. She exhibits no distension and no mass.  Musculoskeletal: Normal range of motion. She exhibits no edema.       Lumbar back: She exhibits tenderness. She exhibits no swelling, no edema and no spasm.  Neurological: She is alert. She has normal strength. She displays no atrophy and no tremor. No sensory deficit. Gait normal.  Reflex Scores:      Patellar reflexes are 2+ on the right side and 2+ on the left side.      Achilles reflexes are 2+ on the right side and 2+ on the left side. No strength deficit noted in hip and knee flexor and extensor muscle groups.  Ankle flexion and extension intact.  Skin: Skin is warm and dry.  Psychiatric: She has a normal mood and affect.  Nursing note and vitals reviewed.    ED Treatments / Results  DIAGNOSTIC STUDIES: Oxygen Saturation is 98% on RA, nl by my interpretation.    COORDINATION OF CARE: 12:12 PM Discussed treatment plan with pt at bedside which includes lumbar spine xray and pt agreed to plan.  12:45 PM- Consult with attending, Dr. Lacinda Axon who agrees with the treatment plan.    Radiology No results found.  Procedures Procedures (including critical care time)  Medications Ordered in ED Medications  oxyCODONE-acetaminophen (PERCOCET/ROXICET) 5-325 MG per tablet 1 tablet (1 tablet  Oral Given 08/13/16 1244)     Initial Impression / Assessment and Plan / ED Course  I have reviewed the triage vital signs and the nursing notes.  Pertinent imaging results that were available during my care of the patient were reviewed by me and considered in my medical decision making (see chart for details).  Clinical Course     No neuro deficit on exam or by history to suggest emergent or surgical presentation.  discussed worsened sx that should prompt immediate re-evaluation including distal weakness, bowel/bladder retention/incontinence.  [place] controlled substance database reviewed. Cautioned re sedation with oxycodone.  Pt reports has taken this previously and tolerated well.  Advised f/u with pcp in one week if sx not improved. Pt left with family.         Final Clinical Impressions(s) / ED Diagnoses   Final diagnoses:  Sciatica of right side    New Prescriptions Discharge Medication List as of 08/13/2016 12:49 PM    START taking these medications   Details  oxyCODONE-acetaminophen (PERCOCET/ROXICET) 5-325 MG tablet Take 1 tablet by mouth every 4 (four) hours as needed., Starting Sun 08/13/2016, Print       I personally performed the services described in this documentation, which was scribed in my presence. The recorded information has been reviewed and is accurate.     Evalee Jefferson, PA-C 08/16/16 2440    Nat Christen, MD 08/22/16 1134

## 2016-08-13 NOTE — Discharge Instructions (Signed)
You may take the oxycodone prescribed for pain relief.  This will make you drowsy - do not drive within 4 hours of taking this medication.  Apply a heating pad set at a low setting to your site of pain for 15 minutes 3-4 times daily.

## 2016-08-24 DIAGNOSIS — Z7984 Long term (current) use of oral hypoglycemic drugs: Secondary | ICD-10-CM | POA: Diagnosis not present

## 2016-08-24 DIAGNOSIS — Z961 Presence of intraocular lens: Secondary | ICD-10-CM | POA: Diagnosis not present

## 2016-08-24 DIAGNOSIS — E119 Type 2 diabetes mellitus without complications: Secondary | ICD-10-CM | POA: Diagnosis not present

## 2016-08-24 DIAGNOSIS — H524 Presbyopia: Secondary | ICD-10-CM | POA: Diagnosis not present

## 2016-08-28 DIAGNOSIS — M5431 Sciatica, right side: Secondary | ICD-10-CM | POA: Diagnosis not present

## 2016-08-28 DIAGNOSIS — Z6841 Body Mass Index (BMI) 40.0 and over, adult: Secondary | ICD-10-CM | POA: Diagnosis not present

## 2016-09-12 DIAGNOSIS — E782 Mixed hyperlipidemia: Secondary | ICD-10-CM | POA: Diagnosis not present

## 2016-09-12 DIAGNOSIS — I10 Essential (primary) hypertension: Secondary | ICD-10-CM | POA: Diagnosis not present

## 2016-09-12 DIAGNOSIS — N184 Chronic kidney disease, stage 4 (severe): Secondary | ICD-10-CM | POA: Diagnosis not present

## 2016-09-12 DIAGNOSIS — E1165 Type 2 diabetes mellitus with hyperglycemia: Secondary | ICD-10-CM | POA: Diagnosis not present

## 2016-09-13 ENCOUNTER — Ambulatory Visit (INDEPENDENT_AMBULATORY_CARE_PROVIDER_SITE_OTHER): Payer: Commercial Managed Care - HMO | Admitting: *Deleted

## 2016-09-13 DIAGNOSIS — I442 Atrioventricular block, complete: Secondary | ICD-10-CM

## 2016-09-13 NOTE — Progress Notes (Signed)
Remote pacemaker transmission.   

## 2016-09-21 DIAGNOSIS — M5431 Sciatica, right side: Secondary | ICD-10-CM | POA: Diagnosis not present

## 2016-09-21 DIAGNOSIS — Z23 Encounter for immunization: Secondary | ICD-10-CM | POA: Diagnosis not present

## 2016-09-21 DIAGNOSIS — E1165 Type 2 diabetes mellitus with hyperglycemia: Secondary | ICD-10-CM | POA: Diagnosis not present

## 2016-09-21 DIAGNOSIS — R800 Isolated proteinuria: Secondary | ICD-10-CM | POA: Diagnosis not present

## 2016-09-21 DIAGNOSIS — I10 Essential (primary) hypertension: Secondary | ICD-10-CM | POA: Diagnosis not present

## 2016-09-21 DIAGNOSIS — E782 Mixed hyperlipidemia: Secondary | ICD-10-CM | POA: Diagnosis not present

## 2016-09-21 DIAGNOSIS — E1121 Type 2 diabetes mellitus with diabetic nephropathy: Secondary | ICD-10-CM | POA: Diagnosis not present

## 2016-09-21 DIAGNOSIS — N184 Chronic kidney disease, stage 4 (severe): Secondary | ICD-10-CM | POA: Diagnosis not present

## 2016-09-22 LAB — CUP PACEART REMOTE DEVICE CHECK
Battery Remaining Longevity: 80 mo
Brady Statistic AP VS Percent: 1 %
Brady Statistic AS VS Percent: 1 %
Date Time Interrogation Session: 20171220141120
Implantable Lead Implant Date: 20150121
Implantable Lead Location: 753859
Implantable Lead Location: 753860
Implantable Pulse Generator Implant Date: 20150121
Lead Channel Impedance Value: 380 Ohm
Lead Channel Impedance Value: 430 Ohm
Lead Channel Pacing Threshold Amplitude: 0.5 V
Lead Channel Pacing Threshold Amplitude: 0.75 V
Lead Channel Pacing Threshold Amplitude: 1.25 V
Lead Channel Sensing Intrinsic Amplitude: 12 mV
Lead Channel Setting Pacing Amplitude: 2 V
Lead Channel Setting Pacing Pulse Width: 0.5 ms
Lead Channel Setting Pacing Pulse Width: 0.5 ms
MDC IDC LEAD IMPLANT DT: 20150121
MDC IDC LEAD IMPLANT DT: 20150121
MDC IDC LEAD LOCATION: 753858
MDC IDC MSMT BATTERY REMAINING PERCENTAGE: 95.5 %
MDC IDC MSMT BATTERY VOLTAGE: 2.96 V
MDC IDC MSMT LEADCHNL LV PACING THRESHOLD PULSEWIDTH: 0.5 ms
MDC IDC MSMT LEADCHNL RA PACING THRESHOLD PULSEWIDTH: 0.5 ms
MDC IDC MSMT LEADCHNL RA SENSING INTR AMPL: 5 mV
MDC IDC MSMT LEADCHNL RV IMPEDANCE VALUE: 510 Ohm
MDC IDC MSMT LEADCHNL RV PACING THRESHOLD PULSEWIDTH: 0.5 ms
MDC IDC PG SERIAL: 7557114
MDC IDC SET LEADCHNL LV PACING AMPLITUDE: 2.25 V
MDC IDC SET LEADCHNL RV PACING AMPLITUDE: 2.5 V
MDC IDC SET LEADCHNL RV SENSING SENSITIVITY: 2 mV
MDC IDC STAT BRADY AP VP PERCENT: 3.2 %
MDC IDC STAT BRADY AS VP PERCENT: 97 %
MDC IDC STAT BRADY RA PERCENT PACED: 3.1 %
Pulse Gen Model: 3242

## 2016-10-03 ENCOUNTER — Emergency Department (HOSPITAL_COMMUNITY)
Admission: EM | Admit: 2016-10-03 | Discharge: 2016-10-03 | Disposition: A | Discharge status: Home / Self Care | Payer: Commercial Managed Care - HMO | Attending: Emergency Medicine | Admitting: Emergency Medicine

## 2016-10-03 ENCOUNTER — Encounter (HOSPITAL_COMMUNITY): Payer: Self-pay

## 2016-10-03 DIAGNOSIS — Z7984 Long term (current) use of oral hypoglycemic drugs: Secondary | ICD-10-CM | POA: Insufficient documentation

## 2016-10-03 DIAGNOSIS — I110 Hypertensive heart disease with heart failure: Secondary | ICD-10-CM | POA: Insufficient documentation

## 2016-10-03 DIAGNOSIS — M5410 Radiculopathy, site unspecified: Secondary | ICD-10-CM | POA: Diagnosis not present

## 2016-10-03 DIAGNOSIS — E1140 Type 2 diabetes mellitus with diabetic neuropathy, unspecified: Secondary | ICD-10-CM | POA: Insufficient documentation

## 2016-10-03 DIAGNOSIS — E119 Type 2 diabetes mellitus without complications: Secondary | ICD-10-CM | POA: Diagnosis not present

## 2016-10-03 DIAGNOSIS — Z950 Presence of cardiac pacemaker: Secondary | ICD-10-CM | POA: Diagnosis not present

## 2016-10-03 DIAGNOSIS — Z853 Personal history of malignant neoplasm of breast: Secondary | ICD-10-CM | POA: Diagnosis not present

## 2016-10-03 DIAGNOSIS — I10 Essential (primary) hypertension: Secondary | ICD-10-CM | POA: Diagnosis not present

## 2016-10-03 DIAGNOSIS — M25551 Pain in right hip: Secondary | ICD-10-CM | POA: Diagnosis present

## 2016-10-03 DIAGNOSIS — I509 Heart failure, unspecified: Secondary | ICD-10-CM | POA: Insufficient documentation

## 2016-10-03 DIAGNOSIS — Z96652 Presence of left artificial knee joint: Secondary | ICD-10-CM | POA: Insufficient documentation

## 2016-10-03 DIAGNOSIS — Z7982 Long term (current) use of aspirin: Secondary | ICD-10-CM | POA: Insufficient documentation

## 2016-10-03 MED ORDER — HYDROCODONE-ACETAMINOPHEN 5-325 MG PO TABS
1.0000 | ORAL_TABLET | Freq: Once | ORAL | Status: AC
  Administered 2016-10-03: 1 via ORAL
  Filled 2016-10-03: qty 1

## 2016-10-03 MED ORDER — HYDROCODONE-ACETAMINOPHEN 5-325 MG PO TABS: 1.0000 | ORAL_TABLET | Freq: Four times a day (QID) | ORAL | 0 refills | Status: DC | PRN

## 2016-10-03 NOTE — ED Provider Notes (Signed)
[facility] DEPT Provider Note   CSN: [id number] Arrival date & time: 10/03/16  1129  By signing my name below, I, Cassandra Parker, attest that this documentation has been prepared under the direction and in the presence of Cassandra Muskrat, MD. Electronically Signed: Soijett Parker, ED Scribe. 10/03/16. 2:54 PM.  History   Chief Complaint Chief Complaint  Patient presents with  . Hip Pain    HPI Cassandra Parker is a 76 y.o. female with a PMHx of arthritis, DM, HTN, who presents to the Emergency Department complaining of gradually worsening, right hip pain radiating to right foot onset 3 months ago. Pt notes that she was evaluated at [facility] and dx with sciatica with no improvement in her pain since. Pt denies recent pain or injury prior to the onset of her symptoms. She has tried Rx prednisone and tylenol with no relief of her symptoms. Pt notes she was seen by her PCP, Dr. Quintin Parker and he attempted to order a MRI for further evaluation, but was unable due to the pt hx of pacemaker placement. She denies numbness, tingling, fever, incontinence, CP, abdominal pain, and any other symptoms. Denies being evaluated by an orthopedist. Denies hx of back surgery or back issues in the past.    The history is provided by the patient. No language interpreter was used.    Past Medical History:  Diagnosis Date  . Arthritis   . Cancer [facility]) 2008   right breast  . Cardiomyopathy ([place])    dating back to 2010  . CHF (congestive heart failure) ([place]) pt stated she was recently diagnosed in another hospital with heart failure and a "weak heart"  . Diabetes mellitus    with neuropathy  . History of blood transfusion   . Hypercholesterolemia   . Hypertension   . Presence of permanent cardiac pacemaker   . Tingling    feet bilat     Patient Active Problem List   Diagnosis Date Noted  . Primary osteoarthritis of left knee 11/04/2015  . Pacemaker 10/23/2014  . Acute renal insufficiency 10/15/2013  .  Mitral stenosis- moderate with trivial MR  10/15/2013  . CHB (complete heart block) ([place]) 10/13/2013  . Syncope 10/13/2013  . HTN (hypertension) 10/13/2013  . Diabetes mellitus type 2, controlled, without complications ([place]) 49/44/9675  . Cancer of breast- s/p chemo and Rt mastectomy 2008 10/13/2013  . Cardiomyopathy- EF 35-40% (etiology not yet determined) 10/13/2013  . Dyslipidemia 10/13/2013    Past Surgical History:  Procedure Laterality Date  . APPENDECTOMY     mmh  . BI-VENTRICULAR PACEMAKER INSERTION N/A 10/15/2013   Procedure: BI-VENTRICULAR PACEMAKER INSERTION (CRT-P);  Surgeon: Cassandra Lance, MD;  Location: [facility] CATH LAB;  Service: Cardiovascular;  Laterality: N/A;  . Cardiomyopathy    . CATARACT EXTRACTION W/PHACO  06/29/2011   Procedure: CATARACT EXTRACTION PHACO AND INTRAOCULAR LENS PLACEMENT (IOC);  Surgeon: Cassandra Parker;  Location: [facility] ORS;  Service: Ophthalmology;  Laterality: Right;  CDE: 14.41  . CATARACT EXTRACTION W/PHACO  09/04/2011   Procedure: CATARACT EXTRACTION PHACO AND INTRAOCULAR LENS PLACEMENT (IOC);  Surgeon: Cassandra Parker;  Location: [facility] ORS;  Service: Ophthalmology;  Laterality: Left;  CDE: 15.21  . CHOLECYSTECTOMY     mmh  . KNEE ARTHROSCOPY     right-mmh  . MASTECTOMY  2008    mmh-right  . TEMPORARY PACEMAKER INSERTION N/A 10/13/2013   Procedure: TEMPORARY PACEMAKER INSERTION;  Surgeon: Cassandra Artist, MD;  Location: [facility] CATH LAB;  Service: Cardiovascular;  Laterality: N/A;  .  TOTAL KNEE ARTHROPLASTY Left 11/04/2015   Procedure: LEFT TOTAL KNEE ARTHROPLASTY;  Surgeon: Cassandra Can, MD;  Location: [facility] ORS;  Service: Orthopedics;  Laterality: Left;    OB History    Gravida Para Term Preterm AB Living   3 3 3          SAB TAB Ectopic Multiple Live Births                   Home Medications    Prior to Admission medications   Medication Sig Start Date End Date Taking? Authorizing Provider  acetaminophen (TYLENOL) 500 MG tablet Take 1,000 mg by mouth 2  (two) times daily.    Historical Provider, MD  aspirin EC 81 MG tablet Take 81 mg by mouth daily.    Historical Provider, MD  carvedilol (COREG) 6.25 MG tablet Take 0.5 tablets (3.125 mg total) by mouth 2 (two) times daily. 10/16/13   Cassandra O Edmisten, PA-C  chlorthalidone (HYGROTON) 25 MG tablet Take 12.5 mg by mouth daily.    Historical Provider, MD  Cholecalciferol (VITAMIN D) 2000 UNITS tablet Take 2,000 Units by mouth daily.     Historical Provider, MD  glipiZIDE (GLUCOTROL XL) 5 MG 24 hr tablet Take 5 mg by mouth 2 (two) times daily.     Historical Provider, MD  Omega-3 Fatty Acids (FISH OIL) 1200 MG CAPS Take 1 capsule by mouth daily.    Historical Provider, MD  oxyCODONE-acetaminophen (PERCOCET/ROXICET) 5-325 MG tablet Take 1 tablet by mouth every 4 (four) hours as needed. 08/13/16   Cassandra Jefferson, PA-C  pioglitazone (ACTOS) 15 MG tablet Take 15 mg by mouth daily.    Historical Provider, MD  pravastatin (PRAVACHOL) 10 MG tablet Take 10 mg by mouth at bedtime.     Historical Provider, MD  valsartan (DIOVAN) 160 MG tablet Take 1 tablet (160 mg total) by mouth daily. 01/05/16   Cassandra Commons, MD    Family History Family History  Problem Relation Age of Onset  . Hypertension Father   . Diabetes Father   . Anesthesia problems Neg Hx   . Hypotension Neg Hx   . Malignant hyperthermia Neg Hx   . Pseudochol deficiency Neg Hx     Social History Social History  Substance Use Topics  . Smoking status: Never Smoker  . Smokeless tobacco: Never Used  . Alcohol use No     Allergies   Patient has no known allergies.   Review of Systems Review of Systems  Constitutional:       Per HPI, otherwise negative  HENT:       Per HPI, otherwise negative  Respiratory:       Per HPI, otherwise negative  Cardiovascular:       Per HPI, otherwise negative  Gastrointestinal: Negative for vomiting.  Endocrine:       Negative aside from HPI  Genitourinary:       Neg aside from HPI     Musculoskeletal:       Per HPI, otherwise negative  Skin: Negative.   Neurological: Negative for syncope.     Physical Exam Updated Vital Signs BP 162/80 (BP Location: Left Arm)   Pulse 92   Temp 98.4 F (36.9 C) (Oral)   Resp 17   SpO2 98%   Physical Exam  Constitutional: She is oriented to person, place, and time. She appears well-developed and well-nourished. No distress.  HENT:  Head: Normocephalic and atraumatic.  Eyes: Conjunctivae and EOM are normal.  Cardiovascular: Normal rate and regular rhythm.   Pulmonary/Chest: Effort normal and breath sounds normal. No stridor. No respiratory distress.  Abdominal: She exhibits no distension.  Musculoskeletal: She exhibits no edema.  Exam secondary to body habitus. LLE unremarkable. Pain with ROM of right hip, but pt is able to move it freely. Pain with right knee flexion and extension.   Neurological: She is alert and oriented to person, place, and time. No cranial nerve deficit.  Skin: Skin is warm and dry.  Psychiatric: She has a normal mood and affect.  Nursing note and vitals reviewed.  ED Treatments / Results  DIAGNOSTIC STUDIES: Oxygen Saturation is 98% on RA, nl by my interpretation.    COORDINATION OF CARE: 2:57 PM Discussed treatment plan with pt at bedside which includes norco Rx and referral and follow up with orthopedist, and pt agreed to plan.  Procedures Procedures (including critical care time)  Medications Ordered in ED Medications - No data to display   Initial Impression / Assessment and Plan / ED Course  I have reviewed the triage vital signs and the nursing notes.   Clinical Course     Elderly F p/w R radicular LE pain.  Prior XR reviewed and notable for degenerative changes.  No MRI due to pacer.  Patient has not seen ortho, and with suspicion of radicular pain, possible sciatica, she will f/u in clinic.  Additional analgesia provided here.   Final Clinical Impressions(s) / ED Diagnoses    Final diagnoses:  Radicular pain of right lower extremity    New Prescriptions New Prescriptions   HYDROCODONE-ACETAMINOPHEN (NORCO/VICODIN) 5-325 MG TABLET    Take 1 tablet by mouth every 6 (six) hours as needed for severe pain.   I personally performed the services described in this documentation, which was scribed in my presence. The recorded information has been reviewed and is accurate.     Cassandra Muskrat, MD 10/03/16 1505

## 2016-10-03 NOTE — Discharge Instructions (Signed)
As discussed, your evaluation today has been largely reassuring.  But, it is important that you monitor your condition carefully, and do not hesitate to return to the ED if you develop new, or concerning changes in your condition.  Your pain is likely due to irritation of the nerves as they leave the spinal column.

## 2016-10-03 NOTE — ED Notes (Signed)
Pt states pain I n rt hip x 2 months , was seen  In ER at [facility] and given pain meds f/u with her pcp and given prednisone pack, when she finished the pain came back after 2 days, was to have MRI but she cannot because she has pacemaker, feels that her leg is more swollen and has been using a walker

## 2016-10-03 NOTE — ED Triage Notes (Addendum)
Pt reports right hip pain that travels down her right leg. She states she was seen at [facility] last month and was told it was sciatica. Pain has not improved. Pt is ambulatory with a walker.

## 2016-10-16 ENCOUNTER — Other Ambulatory Visit (HOSPITAL_COMMUNITY): Payer: Self-pay | Admitting: Physical Medicine and Rehabilitation

## 2016-10-16 DIAGNOSIS — M545 Low back pain, unspecified: Secondary | ICD-10-CM

## 2016-10-16 DIAGNOSIS — G8929 Other chronic pain: Secondary | ICD-10-CM

## 2016-10-16 DIAGNOSIS — M5416 Radiculopathy, lumbar region: Secondary | ICD-10-CM

## 2016-10-18 ENCOUNTER — Other Ambulatory Visit: Payer: Self-pay | Admitting: Radiology

## 2016-10-20 ENCOUNTER — Ambulatory Visit (HOSPITAL_COMMUNITY)
Admission: RE | Admit: 2016-10-20 | Discharge: 2016-10-20 | Disposition: A | Discharge status: Home / Self Care | Payer: Commercial Managed Care - HMO | Source: Ambulatory Visit | Attending: Physical Medicine and Rehabilitation | Admitting: Physical Medicine and Rehabilitation

## 2016-10-20 ENCOUNTER — Other Ambulatory Visit: Payer: Self-pay | Admitting: Cardiovascular Disease

## 2016-10-20 DIAGNOSIS — M48061 Spinal stenosis, lumbar region without neurogenic claudication: Secondary | ICD-10-CM | POA: Insufficient documentation

## 2016-10-20 DIAGNOSIS — Z981 Arthrodesis status: Secondary | ICD-10-CM | POA: Diagnosis not present

## 2016-10-20 DIAGNOSIS — G8929 Other chronic pain: Secondary | ICD-10-CM

## 2016-10-20 DIAGNOSIS — M5416 Radiculopathy, lumbar region: Secondary | ICD-10-CM | POA: Diagnosis present

## 2016-10-20 DIAGNOSIS — M5116 Intervertebral disc disorders with radiculopathy, lumbar region: Secondary | ICD-10-CM | POA: Insufficient documentation

## 2016-10-20 DIAGNOSIS — M545 Low back pain, unspecified: Secondary | ICD-10-CM

## 2016-10-20 DIAGNOSIS — M5127 Other intervertebral disc displacement, lumbosacral region: Secondary | ICD-10-CM | POA: Insufficient documentation

## 2016-10-20 LAB — GLUCOSE, CAPILLARY
GLUCOSE-CAPILLARY: 96 mg/dL (ref 65–99)
Glucose-Capillary: 75 mg/dL (ref 65–99)

## 2016-10-20 MED ORDER — ONDANSETRON HCL 4 MG/2ML IJ SOLN: 4.0000 mg | Freq: Four times a day (QID) | INTRAMUSCULAR | Status: DC | PRN

## 2016-10-20 MED ORDER — HYDROCODONE-ACETAMINOPHEN 5-325 MG PO TABS
ORAL_TABLET | ORAL | Status: AC
Start: 2016-10-20 — End: 2016-10-20
  Administered 2016-10-20: 1 via ORAL
  Filled 2016-10-20: qty 1

## 2016-10-20 MED ORDER — IOPAMIDOL (ISOVUE-M 200) INJECTION 41%
20.0000 mL | Freq: Once | INTRAMUSCULAR | Status: AC
  Administered 2016-10-20: 14 mL via INTRATHECAL

## 2016-10-20 MED ORDER — IOPAMIDOL (ISOVUE-M 200) INJECTION 41%
INTRAMUSCULAR | Status: AC
Start: 2016-10-20 — End: 2016-10-20
  Administered 2016-10-20: 14 mL via INTRATHECAL
  Filled 2016-10-20: qty 10

## 2016-10-20 MED ORDER — LIDOCAINE HCL 1 % IJ SOLN
INTRAMUSCULAR | Status: AC
  Filled 2016-10-20: qty 10

## 2016-10-20 MED ORDER — HYDROCODONE-ACETAMINOPHEN 5-325 MG PO TABS
1.0000 | ORAL_TABLET | Freq: Once | ORAL | Status: AC
  Administered 2016-10-20: 1 via ORAL

## 2016-10-20 MED ORDER — LIDOCAINE HCL (PF) 1 % IJ SOLN
10.0000 mL | Freq: Once | INTRAMUSCULAR | Status: AC
  Administered 2016-10-20: 5 mL via INTRADERMAL

## 2016-10-20 MED ORDER — DIAZEPAM 5 MG PO TABS
10.0000 mg | ORAL_TABLET | Freq: Once | ORAL | Status: AC
  Administered 2016-10-20: 10 mg via ORAL
  Filled 2016-10-20: qty 2

## 2016-10-20 MED ORDER — HYDROCODONE-ACETAMINOPHEN 5-325 MG PO TABS
ORAL_TABLET | ORAL | Status: DC
Start: 2016-10-20 — End: 2016-10-21
  Filled 2016-10-20: qty 1

## 2016-10-20 MED ORDER — DIAZEPAM 5 MG PO TABS
ORAL_TABLET | ORAL | Status: AC
  Filled 2016-10-20: qty 2

## 2016-10-20 MED ORDER — HYDROCODONE-ACETAMINOPHEN 5-325 MG PO TABS
ORAL_TABLET | ORAL | Status: DC
Start: 2016-10-20 — End: 2016-10-20
  Filled 2016-10-20: qty 1

## 2016-10-20 NOTE — Discharge Instructions (Signed)

## 2016-10-20 NOTE — Progress Notes (Signed)
Client refused pain medication at present

## 2016-10-20 NOTE — Progress Notes (Signed)
Dr Logan Bores notified of client's b/p and c/o pain and order noted and per Dr Jeralyn Ruths ok to d/c home with elevated b/p

## 2016-10-20 NOTE — Procedures (Signed)
Lumbar puncture performed at L2-3 for lumbar myelogram. 15 mL of Isovue-M 200 instilled with free flow in subarachnoid space. CT to follow. No immediate complication.

## 2016-10-20 NOTE — Progress Notes (Signed)
Paged Dr Jeralyn Ruths to notify of b/p

## 2017-03-13 ENCOUNTER — Encounter: Payer: Self-pay | Admitting: Internal Medicine

## 2017-03-13 ENCOUNTER — Ambulatory Visit (INDEPENDENT_AMBULATORY_CARE_PROVIDER_SITE_OTHER): Payer: Medicare PPO | Admitting: Internal Medicine

## 2017-03-13 VITALS — BP 136/62 | HR 59 | Ht 69.0 in | Wt 239.0 lb

## 2017-03-13 DIAGNOSIS — I10 Essential (primary) hypertension: Secondary | ICD-10-CM

## 2017-03-13 DIAGNOSIS — I442 Atrioventricular block, complete: Secondary | ICD-10-CM | POA: Diagnosis not present

## 2017-03-13 DIAGNOSIS — Z950 Presence of cardiac pacemaker: Secondary | ICD-10-CM

## 2017-03-13 DIAGNOSIS — I5022 Chronic systolic (congestive) heart failure: Secondary | ICD-10-CM | POA: Diagnosis not present

## 2017-03-13 LAB — CUP PACEART INCLINIC DEVICE CHECK
Date Time Interrogation Session: 20180619140056
Implantable Lead Implant Date: 20150121
Implantable Lead Implant Date: 20150121
Implantable Lead Implant Date: 20150121
Implantable Lead Location: 753858
Implantable Lead Location: 753859
Implantable Pulse Generator Implant Date: 20150121
Lead Channel Pacing Threshold Amplitude: 0.75 V
Lead Channel Pacing Threshold Amplitude: 1.5 V
Lead Channel Pacing Threshold Pulse Width: 0.5 ms
Lead Channel Pacing Threshold Pulse Width: 0.5 ms
Lead Channel Sensing Intrinsic Amplitude: 12 mV
Lead Channel Setting Pacing Amplitude: 2 V
Lead Channel Setting Pacing Pulse Width: 0.5 ms
Lead Channel Setting Pacing Pulse Width: 0.5 ms
MDC IDC LEAD LOCATION: 753860
MDC IDC MSMT BATTERY REMAINING LONGEVITY: 78 mo
MDC IDC MSMT BATTERY VOLTAGE: 2.96 V
MDC IDC MSMT LEADCHNL LV IMPEDANCE VALUE: 400 Ohm
MDC IDC MSMT LEADCHNL LV PACING THRESHOLD AMPLITUDE: 1.5 V
MDC IDC MSMT LEADCHNL LV PACING THRESHOLD PULSEWIDTH: 0.5 ms
MDC IDC MSMT LEADCHNL RA IMPEDANCE VALUE: 425 Ohm
MDC IDC MSMT LEADCHNL RA PACING THRESHOLD AMPLITUDE: 0.75 V
MDC IDC MSMT LEADCHNL RA PACING THRESHOLD PULSEWIDTH: 0.5 ms
MDC IDC MSMT LEADCHNL RA SENSING INTR AMPL: 5 mV
MDC IDC MSMT LEADCHNL RV IMPEDANCE VALUE: 512.5 Ohm
MDC IDC MSMT LEADCHNL RV PACING THRESHOLD AMPLITUDE: 0.75 V
MDC IDC MSMT LEADCHNL RV PACING THRESHOLD AMPLITUDE: 0.75 V
MDC IDC MSMT LEADCHNL RV PACING THRESHOLD PULSEWIDTH: 0.5 ms
MDC IDC MSMT LEADCHNL RV PACING THRESHOLD PULSEWIDTH: 0.5 ms
MDC IDC SET LEADCHNL LV PACING AMPLITUDE: 2.25 V
MDC IDC SET LEADCHNL RV PACING AMPLITUDE: 2.5 V
MDC IDC SET LEADCHNL RV SENSING SENSITIVITY: 2 mV
MDC IDC STAT BRADY RA PERCENT PACED: 2.2 %
MDC IDC STAT BRADY RV PERCENT PACED: 99.9 %
Pulse Gen Model: 3242
Pulse Gen Serial Number: 7557114

## 2017-03-13 NOTE — Patient Instructions (Signed)
Medication Instructions:  Your physician recommends that you continue on your current medications as directed. Please refer to the Current Medication list given to you today.   Labwork: NONE  Testing/Procedures: Remote monitoring is used to monitor your Pacemaker of ICD from home. This monitoring reduces the number of office visits required to check your device to one time per year. It allows [place] to keep an eye on the functioning of your device to ensure it is working properly. You are scheduled for a device check from home on 06/12/17. You may send your transmission at any time that day. If you have a wireless device, the transmission will be sent automatically. After your physician reviews your transmission, you will receive a postcard with your next transmission date.    Follow-Up: Your physician wants you to follow-up in: 1 YEAR . You will receive a reminder letter in the mail two months in advance. If you don't receive a letter, please call our office to schedule the follow-up appointment.  Your physician wants you to follow-up in: December with Dr. Virgina Jock will receive a reminder letter in the mail two months in advance. If you don't receive a letter, please call our office to schedule the follow-up appointment.  Any Other Special Instructions Will Be Listed Below (If Applicable).     If you need a refill on your cardiac medications before your next appointment, please call your pharmacy.

## 2017-03-13 NOTE — Progress Notes (Signed)
HPI [name] returns today for followup. She is a 76 yo woman with chronic systolic heart failure and LV dysfunction who underwent BiV PPM insertion in the past. Her EF normalized. In the interim she has done well. She notes mild peripheral edema but denies dyspnea with exertion. She has been unable to lose weight.    No Known Allergies     Current Outpatient Prescriptions  Medication Sig Dispense Refill  . acetaminophen (TYLENOL) 500 MG tablet Take 1,000 mg by mouth 2 (two) times daily.    Marland Kitchen aspirin EC 81 MG tablet Take 81 mg by mouth daily.    . carvedilol (COREG) 6.25 MG tablet Take 0.5 tablets (3.125 mg total) by mouth 2 (two) times daily. 30 tablet 4  . chlorthalidone (HYGROTON) 25 MG tablet Take 25 mg by mouth daily.     . Cholecalciferol (VITAMIN D) 2000 UNITS tablet Take 2,000 Units by mouth daily.     Marland Kitchen glipiZIDE (GLUCOTROL XL) 5 MG 24 hr tablet Take 5 mg by mouth 2 (two) times daily.     . Omega-3 Fatty Acids (FISH OIL) 1200 MG CAPS Take 1 capsule by mouth daily.    . pioglitazone (ACTOS) 15 MG tablet Take 15 mg by mouth daily.    . pravastatin (PRAVACHOL) 10 MG tablet Take 10 mg by mouth at bedtime.     . valsartan (DIOVAN) 160 MG tablet TAKE 1 TABLET (160 MG TOTAL) BY MOUTH DAILY. 90 tablet 3   No current facility-administered medications for this visit.      Past Medical History:  Diagnosis Date  . Arthritis   . Cancer [facility]) 2008   right breast  . Cardiomyopathy ([place])    dating back to 2010  . CHF (congestive heart failure) ([place]) pt stated she was recently diagnosed in another hospital with heart failure and a "weak heart"  . Diabetes mellitus    with neuropathy  . History of blood transfusion   . Hypercholesterolemia   . Hypertension   . Presence of permanent cardiac pacemaker   . Tingling    feet bilat     ROS:   All systems reviewed and negative except as noted in the HPI.   Past Surgical History:  Procedure Laterality Date  .  APPENDECTOMY     mmh  . BI-VENTRICULAR PACEMAKER INSERTION N/A 10/15/2013   Procedure: BI-VENTRICULAR PACEMAKER INSERTION (CRT-P);  Surgeon: Evans Lance, MD;  Location: [facility] CATH LAB;  Service: Cardiovascular;  Laterality: N/A;  . Cardiomyopathy    . CATARACT EXTRACTION W/PHACO  06/29/2011   Procedure: CATARACT EXTRACTION PHACO AND INTRAOCULAR LENS PLACEMENT (IOC);  Surgeon: Tonny Branch;  Location: [facility] ORS;  Service: Ophthalmology;  Laterality: Right;  CDE: 14.41  . CATARACT EXTRACTION W/PHACO  09/04/2011   Procedure: CATARACT EXTRACTION PHACO AND INTRAOCULAR LENS PLACEMENT (IOC);  Surgeon: Tonny Branch;  Location: [facility] ORS;  Service: Ophthalmology;  Laterality: Left;  CDE: 15.21  . CHOLECYSTECTOMY     mmh  . KNEE ARTHROSCOPY     right-mmh  . MASTECTOMY  2008    mmh-right  . TEMPORARY PACEMAKER INSERTION N/A 10/13/2013   Procedure: TEMPORARY PACEMAKER INSERTION;  Surgeon: Jolaine Artist, MD;  Location: [facility] CATH LAB;  Service: Cardiovascular;  Laterality: N/A;  . TOTAL KNEE ARTHROPLASTY Left 11/04/2015   Procedure: LEFT TOTAL KNEE ARTHROPLASTY;  Surgeon: Rod Can, MD;  Location: [facility] ORS;  Service: Orthopedics;  Laterality: Left;     Family History  Problem Relation  Age of Onset  . Hypertension Father   . Diabetes Father   . Anesthesia problems Neg Hx   . Hypotension Neg Hx   . Malignant hyperthermia Neg Hx   . Pseudochol deficiency Neg Hx      Social History   Social History  . Marital status: Widowed    Spouse name: N/A  . Number of children: N/A  . Years of education: N/A   Occupational History  . Not on file.   Social History Main Topics  . Smoking status: Never Smoker  . Smokeless tobacco: Never Used  . Alcohol use No  . Drug use: No  . Sexual activity: Yes    Birth control/ protection: Post-menopausal   Other Topics Concern  . Not on file   Social History Narrative  . No narrative on file     BP 136/62   Pulse (!) 59   Ht 5\' 9"  (1.753 m)   Wt 239 lb  (108.4 kg)   SpO2 95%   BMI 35.29 kg/m   Physical Exam:  Well appearing 76 yo woman, NAD HEENT: Unremarkable Neck:  6 cm JVD, no thyromegally Back:  No CVA tenderness Lungs:  Clear with no wheezes HEART:  Regular rate rhythm, no murmurs, no rubs, no clicks Abd:  soft, obese, positive bowel sounds, no organomegally, no rebound, no guarding Ext:  2 plus pulses, no edema, no cyanosis, no clubbing Skin:  No rashes no nodules Neuro:  CN II through XII intact, motor grossly intact   DEVICE  Normal device function.  See PaceArt for details.   Assess/Plan: 1. Chronic systolic heart failure - she remains class 1 and had normalization of her LV function after biv pacing. She will continue her current meds and maintain a low sodium diet. 2. SVT - she has had no sustained SVT. Will follow. 3. PPM - Her [employer] biv PPM is working normally. Will recheck in several months. 4. Obesity - her weight remains up and I have encouraged her to lose weight.   Mikle Bosworth.D.

## 2017-03-15 ENCOUNTER — Encounter: Payer: Commercial Managed Care - HMO | Admitting: Internal Medicine

## 2017-05-01 ENCOUNTER — Other Ambulatory Visit: Payer: Self-pay | Admitting: *Deleted

## 2017-05-01 MED ORDER — LOSARTAN POTASSIUM 100 MG PO TABS: 100.0000 mg | ORAL_TABLET | Freq: Every day | ORAL | 3 refills | Status: DC

## 2017-06-12 ENCOUNTER — Encounter: Payer: Medicare PPO | Admitting: *Deleted

## 2017-06-12 ENCOUNTER — Telehealth: Payer: Self-pay | Admitting: Cardiology

## 2017-06-12 NOTE — Telephone Encounter (Signed)
Spoke with pt and reminded pt of remote transmission that is due today. Pt verbalized understanding.   

## 2017-06-15 ENCOUNTER — Encounter: Payer: Self-pay | Admitting: Cardiology

## 2017-06-25 ENCOUNTER — Ambulatory Visit (INDEPENDENT_AMBULATORY_CARE_PROVIDER_SITE_OTHER): Payer: Medicare PPO | Admitting: *Deleted

## 2017-06-25 DIAGNOSIS — I442 Atrioventricular block, complete: Secondary | ICD-10-CM

## 2017-06-26 NOTE — Progress Notes (Signed)
Remote pacemaker transmission.   

## 2017-06-27 ENCOUNTER — Encounter: Payer: Self-pay | Admitting: Cardiology

## 2017-06-28 LAB — CUP PACEART REMOTE DEVICE CHECK
Battery Remaining Longevity: 82 mo
Battery Voltage: 2.96 V
Brady Statistic AP VP Percent: 2.8 %
Brady Statistic RA Percent Paced: 2.7 %
Implantable Lead Implant Date: 20150121
Implantable Lead Implant Date: 20150121
Implantable Lead Location: 753858
Implantable Lead Location: 753859
Implantable Lead Location: 753860
Implantable Pulse Generator Implant Date: 20150121
Lead Channel Impedance Value: 390 Ohm
Lead Channel Impedance Value: 430 Ohm
Lead Channel Pacing Threshold Amplitude: 0.75 V
Lead Channel Pacing Threshold Pulse Width: 0.5 ms
Lead Channel Setting Pacing Amplitude: 2.25 V
Lead Channel Setting Pacing Pulse Width: 0.5 ms
Lead Channel Setting Pacing Pulse Width: 0.5 ms
Lead Channel Setting Sensing Sensitivity: 2 mV
MDC IDC LEAD IMPLANT DT: 20150121
MDC IDC MSMT BATTERY REMAINING PERCENTAGE: 95.5 %
MDC IDC MSMT LEADCHNL LV PACING THRESHOLD AMPLITUDE: 1.5 V
MDC IDC MSMT LEADCHNL LV PACING THRESHOLD PULSEWIDTH: 0.5 ms
MDC IDC MSMT LEADCHNL RA SENSING INTR AMPL: 5 mV
MDC IDC MSMT LEADCHNL RV IMPEDANCE VALUE: 600 Ohm
MDC IDC MSMT LEADCHNL RV PACING THRESHOLD AMPLITUDE: 0.75 V
MDC IDC MSMT LEADCHNL RV PACING THRESHOLD PULSEWIDTH: 0.5 ms
MDC IDC MSMT LEADCHNL RV SENSING INTR AMPL: 12 mV
MDC IDC SESS DTM: 20181001133648
MDC IDC SET LEADCHNL RA PACING AMPLITUDE: 2 V
MDC IDC SET LEADCHNL RV PACING AMPLITUDE: 2.5 V
MDC IDC STAT BRADY AP VS PERCENT: 1 %
MDC IDC STAT BRADY AS VP PERCENT: 97 %
MDC IDC STAT BRADY AS VS PERCENT: 1 %
Pulse Gen Serial Number: 7557114

## 2017-07-04 ENCOUNTER — Ambulatory Visit (INDEPENDENT_AMBULATORY_CARE_PROVIDER_SITE_OTHER): Payer: Medicare PPO | Admitting: Cardiovascular Disease

## 2017-07-04 ENCOUNTER — Encounter: Payer: Self-pay | Admitting: Cardiovascular Disease

## 2017-07-04 VITALS — BP 138/84 | HR 86 | Ht 63.0 in | Wt 234.0 lb

## 2017-07-04 DIAGNOSIS — I10 Essential (primary) hypertension: Secondary | ICD-10-CM | POA: Diagnosis not present

## 2017-07-04 DIAGNOSIS — I5022 Chronic systolic (congestive) heart failure: Secondary | ICD-10-CM

## 2017-07-04 DIAGNOSIS — I429 Cardiomyopathy, unspecified: Secondary | ICD-10-CM

## 2017-07-04 DIAGNOSIS — I050 Rheumatic mitral stenosis: Secondary | ICD-10-CM

## 2017-07-04 DIAGNOSIS — Z23 Encounter for immunization: Secondary | ICD-10-CM

## 2017-07-04 DIAGNOSIS — E785 Hyperlipidemia, unspecified: Secondary | ICD-10-CM

## 2017-07-04 NOTE — Patient Instructions (Signed)
Your physician wants you to follow-up in: 1 YEAR WITH DR KONESWARAN You will receive a reminder letter in the mail two months in advance. If you don't receive a letter, please call our office to schedule the follow-up appointment.  Your physician recommends that you continue on your current medications as directed. Please refer to the Current Medication list given to you today.  Thank you for choosing [facility]!!    

## 2017-07-04 NOTE — Progress Notes (Signed)
SUBJECTIVE: The patient presents for routine follow-up. She has a history of hypertension, hyperlipidemia, breast cancer, insulin-dependent diabetes mellitus, and a cardiomyopathy. She was hospitalized in 2015 for a syncopal episode and was found to be in complete heart block. An echocardiogram at that time revealed an EF of 35-40%. She ultimately underwent placement of a BiV pacemaker. She also had moderate mitral stenosis with a mean gradient of 7 mmHg.  A repeat echocardiogram on 06/04/2014 demonstrated normalization of left ventricular systolic function, EF 93-26%, grade 1 diastolic dysfunction, elevated filling pressures, moderate left atrial dilatation, and mild mitral stenosis with mean gradient of 3 mm mercury.  The patient denies any symptoms of chest pain, palpitations, shortness of breath, lightheadedness, dizziness, leg swelling, orthopnea, PND, and syncope.  ECG performed in the office today which I ordered and personally interpreted demonstrates a ventricular paced rhythm.  She requests influenza vaccination today.   Review of Systems: As per "subjective", otherwise negative.  No Known Allergies  Current Outpatient Prescriptions  Medication Sig Dispense Refill  . acetaminophen (TYLENOL) 500 MG tablet Take 1,000 mg by mouth 2 (two) times daily.    Marland Kitchen aspirin EC 81 MG tablet Take 81 mg by mouth daily.    . carvedilol (COREG) 6.25 MG tablet Take 0.5 tablets (3.125 mg total) by mouth 2 (two) times daily. 30 tablet 4  . chlorthalidone (HYGROTON) 25 MG tablet Take 25 mg by mouth daily.     . Cholecalciferol (VITAMIN D) 2000 UNITS tablet Take 2,000 Units by mouth daily.     Marland Kitchen glipiZIDE (GLUCOTROL XL) 5 MG 24 hr tablet Take 5 mg by mouth 2 (two) times daily.     Marland Kitchen losartan (COZAAR) 100 MG tablet Take 1 tablet (100 mg total) by mouth daily. 90 tablet 3  . Omega-3 Fatty Acids (FISH OIL) 1200 MG CAPS Take 1 capsule by mouth daily.    . pravastatin (PRAVACHOL) 10 MG tablet Take  10 mg by mouth at bedtime.      No current facility-administered medications for this visit.     Past Medical History:  Diagnosis Date  . Arthritis   . Cancer [facility]) 2008   right breast  . Cardiomyopathy ([place])    dating back to 2010  . CHF (congestive heart failure) ([place]) pt stated she was recently diagnosed in another hospital with heart failure and a "weak heart"  . Diabetes mellitus    with neuropathy  . History of blood transfusion   . Hypercholesterolemia   . Hypertension   . Presence of permanent cardiac pacemaker   . Tingling    feet bilat     Past Surgical History:  Procedure Laterality Date  . APPENDECTOMY     mmh  . BI-VENTRICULAR PACEMAKER INSERTION N/A 10/15/2013   Procedure: BI-VENTRICULAR PACEMAKER INSERTION (CRT-P);  Surgeon: Evans Lance, MD;  Location[facility] CATH LAB;  Service: Cardiovascular;  Laterality: N/A;  . Cardiomyopathy    . CATARACT EXTRACTION W/PHACO  06/29/2011   Procedure: CATARACT EXTRACTION PHACO AND INTRAOCULAR LENS PLACEMENT (IOC);  Surgeon: Tonny Branch;  Location: [facility] ORS;  Service: Ophthalmology;  Laterality: Right;  CDE: 14.41  . CATARACT EXTRACTION W/PHACO  09/04/2011   Procedure: CATARACT EXTRACTION PHACO AND INTRAOCULAR LENS PLACEMENT (IOC);  Surgeon: Tonny Branch;  Location: [facility] ORS;  Service: Ophthalmology;  Laterality: Left;  CDE: 15.21  . CHOLECYSTECTOMY     mmh  . KNEE ARTHROSCOPY     right-mmh  . MASTECTOMY  2008  mmh-right  . TEMPORARY PACEMAKER INSERTION N/A 10/13/2013   Procedure: TEMPORARY PACEMAKER INSERTION;  Surgeon: Jolaine Artist, MD;  Location: [facility] CATH LAB;  Service: Cardiovascular;  Laterality: N/A;  . TOTAL KNEE ARTHROPLASTY Left 11/04/2015   Procedure: LEFT TOTAL KNEE ARTHROPLASTY;  Surgeon: Rod Can, MD;  Location: [facility] ORS;  Service: Orthopedics;  Laterality: Left;    Social History   Social History  . Marital status: Widowed    Spouse name: N/A  . Number of children: N/A  . Years of education: N/A    Occupational History  . Not on file.   Social History Main Topics  . Smoking status: Never Smoker  . Smokeless tobacco: Never Used  . Alcohol use No  . Drug use: No  . Sexual activity: Yes    Birth control/ protection: Post-menopausal   Other Topics Concern  . Not on file   Social History Narrative  . No narrative on file     Vitals:   07/04/17 1505  BP: 138/84  Pulse: 86  SpO2: 96%  Weight: 234 lb (106.1 kg)  Height: 5\' 3"  (1.6 m)    Wt Readings from Last 3 Encounters:  07/04/17 234 lb (106.1 kg)  03/13/17 239 lb (108.4 kg)  10/20/16 222 lb (100.7 kg)     PHYSICAL EXAM General: NAD HEENT: Normal. Neck: No JVD, no thyromegaly. Lungs: Clear to auscultation bilaterally with normal respiratory effort. CV: Nondisplaced PMI.  Regular rate and rhythm, normal S1/S2, no D5/H2, I/VI systolic murmur along left sternal border. No pretibial or periankle edema.  No carotid bruit.   Abdomen: Soft, nontender, no distention.  Neurologic: Alert and oriented.  Psych: Normal affect. Skin: Normal. Musculoskeletal: No gross deformities.    ECG: Most recent ECG reviewed.   Labs: Lab Results  Component Value Date/Time   K 3.8 12/14/2015 09:59 PM   BUN 34 (H) 12/14/2015 09:59 PM   CREATININE 2.07 (H) 12/14/2015 09:59 PM   ALT 12 (L) 12/14/2015 09:59 PM   TSH 1.033 10/13/2013 08:00 PM   HGB 9.9 (L) 12/14/2015 09:59 PM     Lipids: No results found for: LDLCALC, LDLDIRECT, CHOL, TRIG, HDL     ASSESSMENT AND PLAN:  1. S/p BiV PPM for complete heart block and cardiomyopathy with pronounced paradoxical septal motion: Stable with normal device function. Follows with Dr. Lovena Le.   2. Cardiomyopathy: Normalization of LV systolic function by echo on 06/04/14. Currently on Coreg and losartan.Symptomatically stable. No changes to therapy.  3. Essential HTN: Controlled. No changes.  4. Mild mitral stenosis: No signs of heart failure at present. Mean gradient 3 mmHg.  Continue current therapy. Monitor for atrial arrhythmias.   5. Will provide influenza vaccination.    Disposition: Follow up 1 year.   Kate Sable, M.D., F.A.C.C.

## 2017-07-09 ENCOUNTER — Ambulatory Visit: Payer: Medicare PPO | Admitting: Cardiovascular Disease

## 2017-09-24 ENCOUNTER — Ambulatory Visit (INDEPENDENT_AMBULATORY_CARE_PROVIDER_SITE_OTHER): Payer: Medicare PPO | Admitting: *Deleted

## 2017-09-24 DIAGNOSIS — I429 Cardiomyopathy, unspecified: Secondary | ICD-10-CM | POA: Diagnosis not present

## 2017-09-24 NOTE — Progress Notes (Signed)
Remote pacemaker transmission.   

## 2017-09-27 LAB — CUP PACEART REMOTE DEVICE CHECK
Battery Remaining Longevity: 74 mo
Brady Statistic AP VS Percent: 1 %
Brady Statistic AS VS Percent: 1 %
Implantable Lead Implant Date: 20150121
Implantable Lead Location: 753859
Implantable Lead Location: 753860
Implantable Pulse Generator Implant Date: 20150121
Lead Channel Impedance Value: 380 Ohm
Lead Channel Impedance Value: 430 Ohm
Lead Channel Pacing Threshold Amplitude: 0.75 V
Lead Channel Pacing Threshold Amplitude: 1.5 V
Lead Channel Pacing Threshold Pulse Width: 0.5 ms
Lead Channel Sensing Intrinsic Amplitude: 12 mV
Lead Channel Setting Pacing Amplitude: 2 V
Lead Channel Setting Pacing Pulse Width: 0.5 ms
Lead Channel Setting Sensing Sensitivity: 2 mV
MDC IDC LEAD IMPLANT DT: 20150121
MDC IDC LEAD IMPLANT DT: 20150121
MDC IDC LEAD LOCATION: 753858
MDC IDC MSMT BATTERY REMAINING PERCENTAGE: 89 %
MDC IDC MSMT BATTERY VOLTAGE: 2.95 V
MDC IDC MSMT LEADCHNL RA PACING THRESHOLD AMPLITUDE: 0.75 V
MDC IDC MSMT LEADCHNL RA PACING THRESHOLD PULSEWIDTH: 0.5 ms
MDC IDC MSMT LEADCHNL RA SENSING INTR AMPL: 5 mV
MDC IDC MSMT LEADCHNL RV IMPEDANCE VALUE: 580 Ohm
MDC IDC MSMT LEADCHNL RV PACING THRESHOLD PULSEWIDTH: 0.5 ms
MDC IDC PG SERIAL: 7557114
MDC IDC SESS DTM: 20181231090934
MDC IDC SET LEADCHNL LV PACING AMPLITUDE: 2.25 V
MDC IDC SET LEADCHNL LV PACING PULSEWIDTH: 0.5 ms
MDC IDC SET LEADCHNL RV PACING AMPLITUDE: 2.5 V
MDC IDC STAT BRADY AP VP PERCENT: 1.7 %
MDC IDC STAT BRADY AS VP PERCENT: 98 %
MDC IDC STAT BRADY RA PERCENT PACED: 1.7 %

## 2017-09-28 ENCOUNTER — Encounter: Payer: Self-pay | Admitting: Cardiology

## 2017-12-24 ENCOUNTER — Ambulatory Visit (INDEPENDENT_AMBULATORY_CARE_PROVIDER_SITE_OTHER): Payer: Medicare PPO | Admitting: *Deleted

## 2017-12-24 DIAGNOSIS — I442 Atrioventricular block, complete: Secondary | ICD-10-CM

## 2017-12-24 NOTE — Progress Notes (Signed)
Remote pacemaker transmission.   

## 2017-12-25 ENCOUNTER — Encounter: Payer: Self-pay | Admitting: Cardiology

## 2017-12-27 IMAGING — DX DG KNEE 1-2V*L*
2 series · 2 of 2 positions shown · non-contrast
Comparison: Portable exam 0665 hours compared to 05/04/2015

CLINICAL DATA: Post LEFT total knee replacement

EXAM:
LEFT KNEE - 1-2 VIEW

[knee ap]
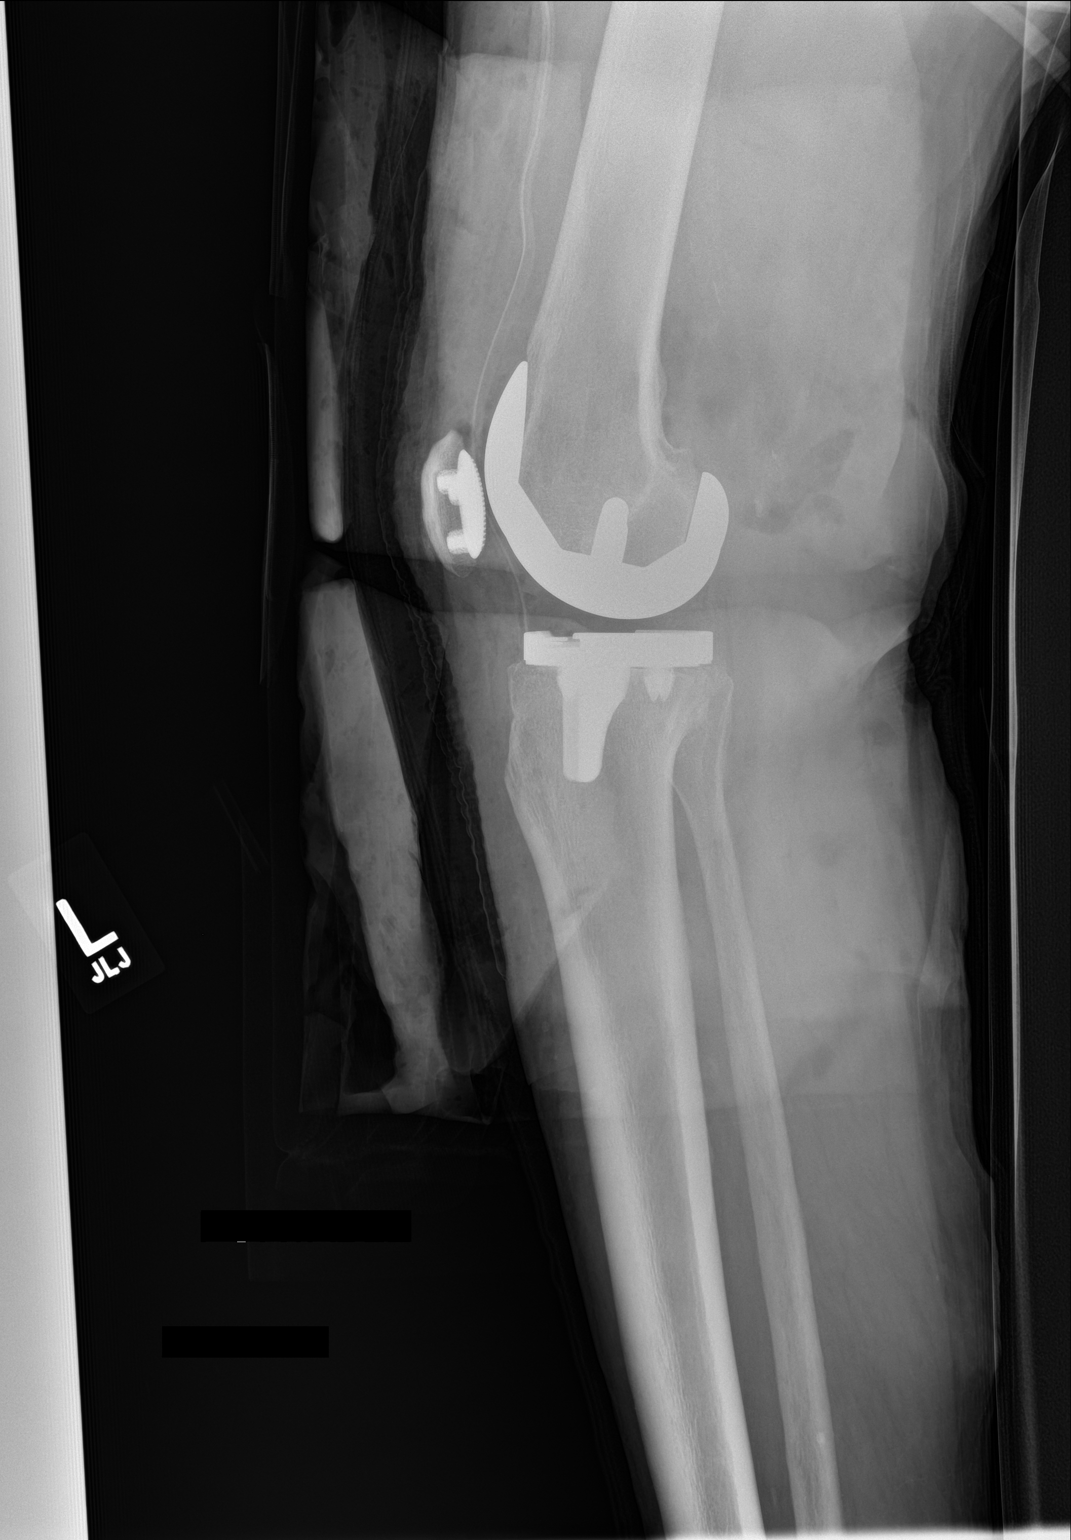

[knee lat]
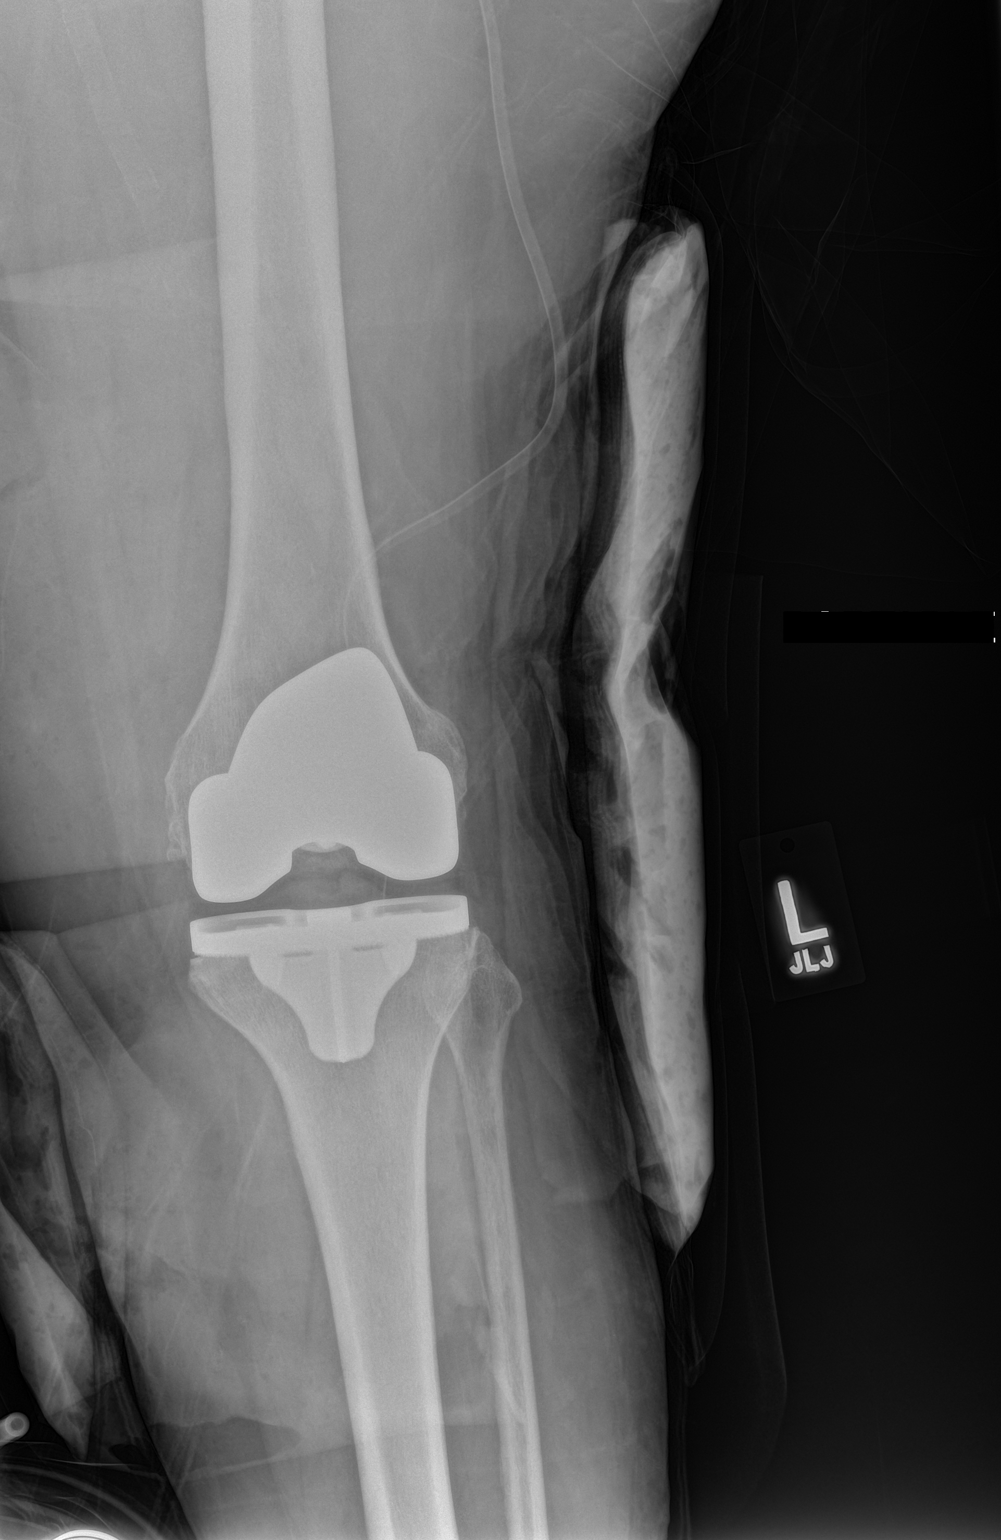

[2 of 2 positions shown; findings below may reference images not displayed]

FINDINGS: Components of LEFT knee prosthesis identified in expected positions.

No acute fracture or dislocation.

Surgical drain and expected postsurgical soft tissue changes
present.

Ice pack noted.
IMPRESSION: LEFT knee prosthesis without acute complication.

## 2018-01-08 LAB — CUP PACEART REMOTE DEVICE CHECK
Battery Remaining Longevity: 76 mo
Battery Voltage: 2.95 V
Brady Statistic AP VS Percent: 1 %
Brady Statistic AS VP Percent: 99 %
Brady Statistic AS VS Percent: 1 %
Implantable Lead Implant Date: 20150121
Implantable Lead Location: 753859
Implantable Pulse Generator Implant Date: 20150121
Lead Channel Impedance Value: 410 Ohm
Lead Channel Impedance Value: 430 Ohm
Lead Channel Pacing Threshold Amplitude: 0.75 V
Lead Channel Pacing Threshold Amplitude: 0.75 V
Lead Channel Pacing Threshold Amplitude: 1.5 V
Lead Channel Pacing Threshold Pulse Width: 0.5 ms
Lead Channel Sensing Intrinsic Amplitude: 12 mV
Lead Channel Sensing Intrinsic Amplitude: 5 mV
Lead Channel Setting Pacing Amplitude: 2 V
Lead Channel Setting Pacing Amplitude: 2.25 V
Lead Channel Setting Pacing Amplitude: 2.5 V
Lead Channel Setting Pacing Pulse Width: 0.5 ms
Lead Channel Setting Pacing Pulse Width: 0.5 ms
MDC IDC LEAD IMPLANT DT: 20150121
MDC IDC LEAD IMPLANT DT: 20150121
MDC IDC LEAD LOCATION: 753858
MDC IDC LEAD LOCATION: 753860
MDC IDC MSMT BATTERY REMAINING PERCENTAGE: 89 %
MDC IDC MSMT LEADCHNL LV PACING THRESHOLD PULSEWIDTH: 0.5 ms
MDC IDC MSMT LEADCHNL RV IMPEDANCE VALUE: 580 Ohm
MDC IDC MSMT LEADCHNL RV PACING THRESHOLD PULSEWIDTH: 0.5 ms
MDC IDC SESS DTM: 20190401095409
MDC IDC SET LEADCHNL RV SENSING SENSITIVITY: 2 mV
MDC IDC STAT BRADY AP VP PERCENT: 1.3 %
MDC IDC STAT BRADY RA PERCENT PACED: 1.3 %
Pulse Gen Model: 3242
Pulse Gen Serial Number: 7557114

## 2018-03-15 ENCOUNTER — Encounter: Payer: Self-pay | Admitting: Internal Medicine

## 2018-03-15 ENCOUNTER — Ambulatory Visit: Payer: Medicare PPO | Admitting: Internal Medicine

## 2018-03-15 VITALS — BP 144/70 | HR 79 | Ht 63.0 in | Wt 235.0 lb

## 2018-03-15 DIAGNOSIS — I442 Atrioventricular block, complete: Secondary | ICD-10-CM

## 2018-03-15 DIAGNOSIS — I5022 Chronic systolic (congestive) heart failure: Secondary | ICD-10-CM | POA: Diagnosis not present

## 2018-03-15 DIAGNOSIS — Z950 Presence of cardiac pacemaker: Secondary | ICD-10-CM | POA: Diagnosis not present

## 2018-03-15 LAB — CUP PACEART INCLINIC DEVICE CHECK
Brady Statistic RV Percent Paced: 99.87 %
Date Time Interrogation Session: 20190621102350
Implantable Lead Implant Date: 20150121
Implantable Lead Implant Date: 20150121
Implantable Lead Location: 753859
Implantable Pulse Generator Implant Date: 20150121
Lead Channel Impedance Value: 525 Ohm
Lead Channel Pacing Threshold Amplitude: 0.75 V
Lead Channel Pacing Threshold Amplitude: 1.25 V
Lead Channel Pacing Threshold Pulse Width: 0.5 ms
Lead Channel Pacing Threshold Pulse Width: 0.5 ms
Lead Channel Pacing Threshold Pulse Width: 0.5 ms
Lead Channel Pacing Threshold Pulse Width: 0.5 ms
Lead Channel Sensing Intrinsic Amplitude: 12 mV
Lead Channel Sensing Intrinsic Amplitude: 5 mV
Lead Channel Setting Pacing Amplitude: 2 V
Lead Channel Setting Pacing Amplitude: 2.5 V
Lead Channel Setting Pacing Pulse Width: 0.5 ms
Lead Channel Setting Pacing Pulse Width: 0.5 ms
MDC IDC LEAD IMPLANT DT: 20150121
MDC IDC LEAD LOCATION: 753858
MDC IDC LEAD LOCATION: 753860
MDC IDC MSMT BATTERY REMAINING LONGEVITY: 72 mo
MDC IDC MSMT BATTERY VOLTAGE: 2.95 V
MDC IDC MSMT LEADCHNL LV IMPEDANCE VALUE: 387.5 Ohm
MDC IDC MSMT LEADCHNL LV PACING THRESHOLD AMPLITUDE: 1.25 V
MDC IDC MSMT LEADCHNL RA IMPEDANCE VALUE: 400 Ohm
MDC IDC MSMT LEADCHNL RA PACING THRESHOLD AMPLITUDE: 0.75 V
MDC IDC MSMT LEADCHNL RA PACING THRESHOLD PULSEWIDTH: 0.5 ms
MDC IDC MSMT LEADCHNL RV PACING THRESHOLD AMPLITUDE: 0.75 V
MDC IDC MSMT LEADCHNL RV PACING THRESHOLD AMPLITUDE: 0.75 V
MDC IDC MSMT LEADCHNL RV PACING THRESHOLD PULSEWIDTH: 0.5 ms
MDC IDC SET LEADCHNL LV PACING AMPLITUDE: 2.25 V
MDC IDC SET LEADCHNL RV SENSING SENSITIVITY: 2 mV
MDC IDC STAT BRADY RA PERCENT PACED: 1 %
Pulse Gen Model: 3242
Pulse Gen Serial Number: 7557114

## 2018-03-15 NOTE — Patient Instructions (Signed)
Medication Instructions:  Your physician recommends that you continue on your current medications as directed. Please refer to the Current Medication list given to you today.  Labwork: None ordered.  Testing/Procedures: None ordered.  Follow-Up: Your physician wants you to follow-up in: one year with Dr. Lovena Le.   You will receive a reminder letter in the mail two months in advance. If you don't receive a letter, please call our office to schedule the follow-up appointment.  Remote monitoring is used to monitor your Pacemaker from home. This monitoring reduces the number of office visits required to check your device to one time per year. It allows [place] to keep an eye on the functioning of your device to ensure it is working properly. You are scheduled for a device check from home on 03/25/2018. You may send your transmission at any time that day. If you have a wireless device, the transmission will be sent automatically. After your physician reviews your transmission, you will receive a postcard with your next transmission date.  Any Other Special Instructions Will Be Listed Below (If Applicable).  If you need a refill on your cardiac medications before your next appointment, please call your pharmacy.

## 2018-03-15 NOTE — Progress Notes (Signed)
HPI Cassandra Parker returns today for followup. She is a 77 yo woman with chronic systolic heart failure and LV dysfunction who underwent BiV PPM insertion in the past. Her EF normalized. In the interim she has done well. She notes mild peripheral edema but denies dyspnea with exertion. She has been unable to lose weight.  No Known Allergies   Current Outpatient Medications  Medication Sig Dispense Refill  . aspirin EC 81 MG tablet Take 81 mg by mouth daily.    . carvedilol (COREG) 6.25 MG tablet Take 0.5 tablets (3.125 mg total) by mouth 2 (two) times daily. 30 tablet 4  . chlorthalidone (HYGROTON) 25 MG tablet Take 25 mg by mouth daily.     . Cholecalciferol (VITAMIN D) 2000 UNITS tablet Take 2,000 Units by mouth daily.     Marland Kitchen glipiZIDE (GLUCOTROL XL) 5 MG 24 hr tablet Take 5 mg by mouth daily with breakfast.     . losartan (COZAAR) 100 MG tablet Take 1 tablet (100 mg total) by mouth daily. 90 tablet 3  . Omega-3 Fatty Acids (FISH OIL) 1200 MG CAPS Take 1 capsule by mouth daily.    . pioglitazone (ACTOS) 15 MG tablet Take 1 tablet by mouth daily.    . pravastatin (PRAVACHOL) 10 MG tablet Take 10 mg by mouth at bedtime.      No current facility-administered medications for this visit.      Past Medical History:  Diagnosis Date  . Acute renal insufficiency 10/15/2013  . Arthritis   . Cancer [facility]) 2008   right breast  . Cancer of breast- s/p chemo and Rt mastectomy 2008 10/13/2013  . Cardiomyopathy ([place])    dating back to 2010  . CHB (complete heart block) ([place]) 10/13/2013  . CHF (congestive heart failure) ([place]) pt stated she was recently diagnosed in another hospital with heart failure and a "weak heart"  . Diabetes mellitus    with neuropathy  . Diabetes mellitus type 2, controlled, without complications ([place]) 7/48/2707  . Dyslipidemia 10/13/2013  . History of blood transfusion   . Hypercholesterolemia   . Hypertension   . Mitral stenosis- moderate with trivial MR   10/15/2013  . Pacemaker 10/23/2014  . Presence of permanent cardiac pacemaker   . Primary osteoarthritis of left knee 11/04/2015  . Syncope 10/13/2013  . Tingling    feet bilat     ROS:   All systems reviewed and negative except as noted in the HPI.   Past Surgical History:  Procedure Laterality Date  . APPENDECTOMY     mmh  . BI-VENTRICULAR PACEMAKER INSERTION N/A 10/15/2013   Procedure: BI-VENTRICULAR PACEMAKER INSERTION (CRT-P);  Surgeon: Evans Lance, MD;  Location: [facility] C[facility] LAB;  Service: Cardiovascular;  Laterality: N/A;  . Cardiomyopathy    . CATARACT EXTRACTION W/PHACO  06/29/2011   Procedure: CATARACT EXTRACTION PHACO AND INTRAOCULAR LENS PLACEMENT (IOC);  Surgeon: Tonny Branch;  Location: [facility] ORS;  Service: Ophthalmology;  Laterality: Right;  CDE: 14.41  . CATARACT EXTRACTION W/PHACO  09/04/2011   Procedure: CATARACT EXTRACTION PHACO AND INTRAOCULAR LENS PLACEMENT (IOC);  Surgeon: Tonny Branch;  Location: [facility] ORS;  Service: Ophthalmology;  Laterality: Left;  CDE: 15.21  . CHOLECYSTECTOMY     mmh  . KNEE ARTHROSCOPY     right-mmh  . MASTECTOMY  2008    mmh-right  . TEMPORARY PACEMAKER INSERTION N/A 10/13/2013   Procedure: TEMPORARY PACEMAKER INSERTION;  Surgeon: Jolaine Artist, MD;  Location: [facility] CATH LAB;  Service: Cardiovascular;  Laterality: N/A;  . TOTAL KNEE ARTHROPLASTY Left 11/04/2015   Procedure: LEFT TOTAL KNEE ARTHROPLASTY;  Surgeon: Rod Can, MD;  Location: [facility] ORS;  Service: Orthopedics;  Laterality: Left;     Family History  Problem Relation Age of Onset  . Hypertension Father   . Diabetes Father   . Anesthesia problems Neg Hx   . Hypotension Neg Hx   . Malignant hyperthermia Neg Hx   . Pseudochol deficiency Neg Hx      Social History   Socioeconomic History  . Marital status: Widowed    Spouse name: Not on file  . Number of children: Not on file  . Years of education: Not on file  . Highest education level: Not on file  Occupational History  .  Not on file  Social Needs  . Financial resource strain: Not on file  . Food insecurity:    Worry: Not on file    Inability: Not on file  . Transportation needs:    Medical: Not on file    Non-medical: Not on file  Tobacco Use  . Smoking status: Never Smoker  . Smokeless tobacco: Never Used  Substance and Sexual Activity  . Alcohol use: No    Alcohol/week: 0.0 oz  . Drug use: No  . Sexual activity: Yes    Birth control/protection: Post-menopausal  Lifestyle  . Physical activity:    Days per week: Not on file    Minutes per session: Not on file  . Stress: Not on file  Relationships  . Social connections:    Talks on phone: Not on file    Gets together: Not on file    Attends religious service: Not on file    Active member of club or organization: Not on file    Attends meetings of clubs or organizations: Not on file    Relationship status: Not on file  . Intimate partner violence:    Fear of current or ex partner: Not on file    Emotionally abused: Not on file    Physically abused: Not on file    Forced sexual activity: Not on file  Other Topics Concern  . Not on file  Social History Narrative  . Not on file     BP (!) 144/70   Pulse 79   Ht 5\' 3"  (1.6 m)   Wt 235 lb (106.6 kg)   SpO2 99%   BMI 41.63 kg/m   Physical Exam:  Well appearing NAD HEENT: Unremarkable Neck:  No JVD, no thyromegally Lymphatics:  No adenopathy Back:  No CVA tenderness Lungs:  Clear HEART:  Regular rate rhythm, no murmurs, no rubs, no clicks Abd:  soft, positive bowel sounds, no organomegally, no rebound, no guarding Ext:  2 plus pulses, no edema, no cyanosis, no clubbing Skin:  No rashes no nodules Neuro:  CN II through XII intact, motor grossly intact  EKG - NSR with p synchronous ventricular pacing  DEVICE  Normal device function.  See PaceArt for details.   Assess/Plan: 1. Chronic systolic heart failure - her EF has normalized and her symptoms are class 2. Her fluid  monitor was up a bit today suggesting she is a little wet. SHe has not taken any lasix for over a week.  I have asked her to take her lasix for 2 days. 2. PPM - her [employer] biv PPM is working normally. Will recheck in several months. 3. Obesity - I have encouraged the patient to  lose weight. She plans to start exercising. She may proceed with no limitation.  Mikle Bosworth.D.

## 2018-03-25 ENCOUNTER — Ambulatory Visit (INDEPENDENT_AMBULATORY_CARE_PROVIDER_SITE_OTHER): Payer: Medicare PPO | Admitting: *Deleted

## 2018-03-25 DIAGNOSIS — I442 Atrioventricular block, complete: Secondary | ICD-10-CM | POA: Diagnosis not present

## 2018-03-25 NOTE — Progress Notes (Signed)
Remote pacemaker transmission.   

## 2018-04-11 LAB — CUP PACEART REMOTE DEVICE CHECK
Battery Remaining Longevity: 76 mo
Brady Statistic AP VS Percent: 1 %
Brady Statistic AS VS Percent: 1 %
Date Time Interrogation Session: 20190701103416
Implantable Lead Implant Date: 20150121
Implantable Lead Location: 753858
Implantable Lead Location: 753859
Implantable Pulse Generator Implant Date: 20150121
Lead Channel Impedance Value: 430 Ohm
Lead Channel Pacing Threshold Amplitude: 0.75 V
Lead Channel Pacing Threshold Amplitude: 0.75 V
Lead Channel Pacing Threshold Amplitude: 1.25 V
Lead Channel Pacing Threshold Pulse Width: 0.5 ms
Lead Channel Pacing Threshold Pulse Width: 0.5 ms
Lead Channel Sensing Intrinsic Amplitude: 12 mV
Lead Channel Setting Pacing Amplitude: 2.25 V
Lead Channel Setting Pacing Amplitude: 2.5 V
Lead Channel Setting Pacing Pulse Width: 0.5 ms
Lead Channel Setting Pacing Pulse Width: 0.5 ms
MDC IDC LEAD IMPLANT DT: 20150121
MDC IDC LEAD IMPLANT DT: 20150121
MDC IDC LEAD LOCATION: 753860
MDC IDC MSMT BATTERY REMAINING PERCENTAGE: 89 %
MDC IDC MSMT BATTERY VOLTAGE: 2.95 V
MDC IDC MSMT LEADCHNL LV IMPEDANCE VALUE: 410 Ohm
MDC IDC MSMT LEADCHNL RA SENSING INTR AMPL: 5 mV
MDC IDC MSMT LEADCHNL RV IMPEDANCE VALUE: 560 Ohm
MDC IDC MSMT LEADCHNL RV PACING THRESHOLD PULSEWIDTH: 0.5 ms
MDC IDC SET LEADCHNL RA PACING AMPLITUDE: 2 V
MDC IDC SET LEADCHNL RV SENSING SENSITIVITY: 2 mV
MDC IDC STAT BRADY AP VP PERCENT: 1.8 %
MDC IDC STAT BRADY AS VP PERCENT: 98 %
MDC IDC STAT BRADY RA PERCENT PACED: 1.7 %
Pulse Gen Model: 3242
Pulse Gen Serial Number: 7557114

## 2018-04-15 ENCOUNTER — Other Ambulatory Visit: Payer: Self-pay | Admitting: Cardiovascular Disease

## 2018-06-24 ENCOUNTER — Ambulatory Visit (INDEPENDENT_AMBULATORY_CARE_PROVIDER_SITE_OTHER): Payer: Medicare PPO | Admitting: *Deleted

## 2018-06-24 DIAGNOSIS — I442 Atrioventricular block, complete: Secondary | ICD-10-CM

## 2018-06-24 DIAGNOSIS — I5022 Chronic systolic (congestive) heart failure: Secondary | ICD-10-CM

## 2018-06-24 NOTE — Progress Notes (Signed)
Remote pacemaker transmission.   

## 2018-07-18 LAB — CUP PACEART REMOTE DEVICE CHECK
Battery Remaining Longevity: 65 mo
Brady Statistic AP VP Percent: 1 %
Brady Statistic AP VS Percent: 1 %
Brady Statistic AS VS Percent: 1 %
Brady Statistic RA Percent Paced: 1 %
Date Time Interrogation Session: 20190930080019
Implantable Lead Implant Date: 20150121
Implantable Lead Implant Date: 20150121
Implantable Lead Location: 753859
Implantable Pulse Generator Implant Date: 20150121
Lead Channel Impedance Value: 380 Ohm
Lead Channel Impedance Value: 430 Ohm
Lead Channel Pacing Threshold Amplitude: 0.75 V
Lead Channel Pacing Threshold Pulse Width: 0.5 ms
Lead Channel Pacing Threshold Pulse Width: 0.5 ms
Lead Channel Sensing Intrinsic Amplitude: 12 mV
Lead Channel Sensing Intrinsic Amplitude: 5 mV
Lead Channel Setting Pacing Amplitude: 2 V
Lead Channel Setting Pacing Amplitude: 2.5 V
Lead Channel Setting Pacing Pulse Width: 0.5 ms
Lead Channel Setting Pacing Pulse Width: 0.5 ms
MDC IDC LEAD IMPLANT DT: 20150121
MDC IDC LEAD LOCATION: 753858
MDC IDC LEAD LOCATION: 753860
MDC IDC MSMT BATTERY REMAINING PERCENTAGE: 80 %
MDC IDC MSMT BATTERY VOLTAGE: 2.93 V
MDC IDC MSMT LEADCHNL LV PACING THRESHOLD AMPLITUDE: 1.25 V
MDC IDC MSMT LEADCHNL RA PACING THRESHOLD AMPLITUDE: 0.75 V
MDC IDC MSMT LEADCHNL RV IMPEDANCE VALUE: 550 Ohm
MDC IDC MSMT LEADCHNL RV PACING THRESHOLD PULSEWIDTH: 0.5 ms
MDC IDC PG SERIAL: 7557114
MDC IDC SET LEADCHNL LV PACING AMPLITUDE: 2.25 V
MDC IDC SET LEADCHNL RV SENSING SENSITIVITY: 2 mV
MDC IDC STAT BRADY AS VP PERCENT: 99 %

## 2018-07-26 ENCOUNTER — Ambulatory Visit: Payer: Medicare PPO | Admitting: Cardiovascular Disease

## 2018-08-01 ENCOUNTER — Ambulatory Visit: Payer: Medicare PPO | Admitting: Cardiovascular Disease

## 2018-08-01 ENCOUNTER — Encounter: Payer: Self-pay | Admitting: Cardiovascular Disease

## 2018-08-01 VITALS — BP 158/70 | HR 80 | Ht 63.0 in | Wt 232.0 lb

## 2018-08-01 DIAGNOSIS — I5022 Chronic systolic (congestive) heart failure: Secondary | ICD-10-CM

## 2018-08-01 DIAGNOSIS — Z7182 Exercise counseling: Secondary | ICD-10-CM

## 2018-08-01 DIAGNOSIS — I429 Cardiomyopathy, unspecified: Secondary | ICD-10-CM

## 2018-08-01 DIAGNOSIS — I050 Rheumatic mitral stenosis: Secondary | ICD-10-CM

## 2018-08-01 DIAGNOSIS — I10 Essential (primary) hypertension: Secondary | ICD-10-CM

## 2018-08-01 DIAGNOSIS — I442 Atrioventricular block, complete: Secondary | ICD-10-CM

## 2018-08-01 DIAGNOSIS — Z950 Presence of cardiac pacemaker: Secondary | ICD-10-CM | POA: Diagnosis not present

## 2018-08-01 NOTE — Patient Instructions (Signed)

## 2018-08-01 NOTE — Progress Notes (Signed)
SUBJECTIVE: The patient presents for routine follow-up. She has a history of hypertension, hyperlipidemia, breast cancer, insulin-dependent diabetes mellitus, and a cardiomyopathy. She was hospitalized in 2015 for a syncopal episode and was found to be in complete heart block. An echocardiogram at that time revealed an EF of 35-40%. She ultimately underwent placement of a BiV pacemaker. She also had moderate mitral stenosis with a mean gradient of 7 mmHg.  A repeat echocardiogram on 06/04/2014 demonstrated normalization of left ventricular systolic function, EF 17-49%, grade 1 diastolic dysfunction, elevated filling pressures, moderate left atrial dilatation, and mild mitral stenosis with mean gradient of 3 mm mercury.  The patient denies any symptoms of chest pain, palpitations, lightheadedness, dizziness, leg swelling, orthopnea, PND, and syncope. She gets short of breath with any significant amount of walking but she does not do any routine exercising whatsoever.  She said she needs to start walking on a regular basis.    Review of Systems: As per "subjective", otherwise negative.  No Known Allergies  Current Outpatient Medications  Medication Sig Dispense Refill  . aspirin EC 81 MG tablet Take 81 mg by mouth daily.    . carvedilol (COREG) 6.25 MG tablet Take 0.5 tablets (3.125 mg total) by mouth 2 (two) times daily. 30 tablet 4  . chlorthalidone (HYGROTON) 25 MG tablet Take 25 mg by mouth daily.     . Cholecalciferol (VITAMIN D) 2000 UNITS tablet Take 2,000 Units by mouth daily.     Marland Kitchen glipiZIDE (GLUCOTROL XL) 5 MG 24 hr tablet Take 5 mg by mouth 2 (two) times daily.     Marland Kitchen losartan (COZAAR) 100 MG tablet TAKE 1 TABLET ONE TIME DAILY (DISCONTINUE  VALSARTAN 160MG  ) 90 tablet 3  . Omega-3 Fatty Acids (FISH OIL) 1200 MG CAPS Take 1 capsule by mouth daily.    . pioglitazone (ACTOS) 15 MG tablet Take 1 tablet by mouth daily.    . pravastatin (PRAVACHOL) 10 MG tablet Take 10 mg by  mouth at bedtime.      No current facility-administered medications for this visit.     Past Medical History:  Diagnosis Date  . Acute renal insufficiency 10/15/2013  . Arthritis   . Cancer [facility]) 2008   right breast  . Cancer of breast- s/p chemo and Rt mastectomy 2008 10/13/2013  . Cardiomyopathy ([place])    dating back to 2010  . CHB (complete heart block) ([place]) 10/13/2013  . CHF (congestive heart failure) ([place]) pt stated she was recently diagnosed in another hospital with heart failure and a "weak heart"  . Diabetes mellitus    with neuropathy  . Diabetes mellitus type 2, controlled, without complications ([place]) 4/49/6759  . Dyslipidemia 10/13/2013  . History of blood transfusion   . Hypercholesterolemia   . Hypertension   . Mitral stenosis- moderate with trivial MR  10/15/2013  . Pacemaker 10/23/2014  . Presence of permanent cardiac pacemaker   . Primary osteoarthritis of left knee 11/04/2015  . Syncope 10/13/2013  . Tingling    feet bilat     Past Surgical History:  Procedure Laterality Date  . APPENDECTOMY     mmh  . BI-VENTRICULAR PACEMAKER INSERTION N/A 10/15/2013   Procedure: BI-VENTRICULAR PACEMAKER INSERTION (CRT-P);  Surgeon: Evans Lance, MD;  Location[facility] CATH LAB;  Service: Cardiovascular;  Laterality: N/A;  . Cardiomyopathy    . CATARACT EXTRACTION W/PHACO  06/29/2011   Procedure: CATARACT EXTRACTION PHACO AND INTRAOCULAR LENS PLACEMENT (IOC);  Surgeon: Tonny Branch;  Location: [facility];  Service: Ophthalmology;  Laterality: Right;  CDE: 14.41  . CATARACT EXTRACTION W/PHACO  09/04/2011   Procedure: CATARACT EXTRACTION PHACO AND INTRAOCULAR LENS PLACEMENT (IOC);  Surgeon: Tonny Branch;  Location: [facility];  Service: Ophthalmology;  Laterality: Left;  CDE: 15.21  . CHOLECYSTECTOMY     mmh  . KNEE ARTHROSCOPY     right-mmh  . MASTECTOMY  2008    mmh-right  . TEMPORARY PACEMAKER INSERTION N/A 10/13/2013   Procedure: TEMPORARY PACEMAKER INSERTION;  Surgeon: Jolaine Artist,  MD;  Location: [facility] CATH LAB;  Service: Cardiovascular;  Laterality: N/A;  . TOTAL KNEE ARTHROPLASTY Left 11/04/2015   Procedure: LEFT TOTAL KNEE ARTHROPLASTY;  Surgeon: Rod Can, MD;  Location[facility];  Service: Orthopedics;  Laterality: Left;    Social History   Socioeconomic History  . Marital status: Widowed    Spouse name: Not on file  . Number of children: Not on file  . Years of education: Not on file  . Highest education level: Not on file  Occupational History  . Not on file  Social Needs  . Financial resource strain: Not on file  . Food insecurity:    Worry: Not on file    Inability: Not on file  . Transportation needs:    Medical: Not on file    Non-medical: Not on file  Tobacco Use  . Smoking status: Never Smoker  . Smokeless tobacco: Never Used  Substance and Sexual Activity  . Alcohol use: No    Alcohol/week: 0.0 standard drinks  . Drug use: No  . Sexual activity: Yes    Birth control/protection: Post-menopausal  Lifestyle  . Physical activity:    Days per week: Not on file    Minutes per session: Not on file  . Stress: Not on file  Relationships  . Social connections:    Talks on phone: Not on file    Gets together: Not on file    Attends religious service: Not on file    Active member of club or organization: Not on file    Attends meetings of clubs or organizations: Not on file    Relationship status: Not on file  . Intimate partner violence:    Fear of current or ex partner: Not on file    Emotionally abused: Not on file    Physically abused: Not on file    Forced sexual activity: Not on file  Other Topics Concern  . Not on file  Social History Narrative  . Not on file     Vitals:   08/01/18 1034  BP: (!) 158/70  Pulse: 80  SpO2: 94%  Weight: 232 lb (105.2 kg)  Height: 5\' 3"  (1.6 m)    Wt Readings from Last 3 Encounters:  08/01/18 232 lb (105.2 kg)  03/15/18 235 lb (106.6 kg)  07/04/17 234 lb (106.1 kg)     PHYSICAL  EXAM General: NAD HEENT: Normal. Neck: No JVD, no thyromegaly. Lungs: Clear to auscultation bilaterally with normal respiratory effort. CV: Regular rate and rhythm, normal S1/S2, no N2/D7, I/VI systolic murmur along left sternal border. No pretibial or periankle edema.  No carotid bruit.   Abdomen: Soft, nontender, no distention.  Neurologic: Alert and oriented.  Psych: Normal affect. Skin: Normal. Musculoskeletal: No gross deformities.    ECG: Reviewed above under Subjective   Labs: Lab Results  Component Value Date/Time   K 3.8 12/14/2015 09:59 PM   BUN 34 (H) 12/14/2015 09:59 PM  CREATININE 2.07 (H) 12/14/2015 09:59 PM   ALT 12 (L) 12/14/2015 09:59 PM   TSH 1.033 10/13/2013 08:00 PM   HGB 9.9 (L) 12/14/2015 09:59 PM     Lipids: No results found for: LDLCALC, LDLDIRECT, CHOL, TRIG, HDL     ASSESSMENT AND PLAN: 1. S/p BiV PPM for complete heart block and cardiomyopathy with pronounced paradoxical septal motion: Stable with normal device function. Follows with Dr. Lovena Le.   2. Cardiomyopathy: Normalization of LV systolic function by echo on 06/04/14. Currently on Coreg and losartan.Symptomatically stable. No changes to therapy.  3. Essential HTN:  Blood pressure is elevated.  She is obese and needs lifestyle modification with exercise and dietary changes.  No changes to medical therapy.  I encouraged her to begin walking at least 3 days/week and to join the [facility].  4. Mild mitral stenosis: No signs of heart failure at present. Mean gradient 3 mmHg. Continue current therapy. Monitor for atrial arrhythmias.   5.  Obesity: She is obese and needs lifestyle modification with exercise and dietary changes.  I encouraged her to begin walking at least 3 days/week and to join the [facility].    Disposition: Follow up 1 year   Kate Sable, M.D., F.A.C.C.

## 2018-09-23 ENCOUNTER — Ambulatory Visit (INDEPENDENT_AMBULATORY_CARE_PROVIDER_SITE_OTHER): Payer: Medicare PPO

## 2018-09-23 DIAGNOSIS — I442 Atrioventricular block, complete: Secondary | ICD-10-CM

## 2018-09-23 DIAGNOSIS — I5022 Chronic systolic (congestive) heart failure: Secondary | ICD-10-CM

## 2018-09-23 NOTE — Progress Notes (Signed)
Remote pacemaker transmission.   

## 2018-09-24 LAB — CUP PACEART REMOTE DEVICE CHECK
Battery Remaining Percentage: 80 %
Battery Voltage: 2.93 V
Brady Statistic AP VP Percent: 1 %
Brady Statistic AP VS Percent: 1 %
Brady Statistic AS VP Percent: 99 %
Implantable Lead Implant Date: 20150121
Implantable Lead Implant Date: 20150121
Implantable Lead Implant Date: 20150121
Implantable Lead Location: 753858
Implantable Lead Location: 753860
Implantable Pulse Generator Implant Date: 20150121
Lead Channel Impedance Value: 380 Ohm
Lead Channel Impedance Value: 550 Ohm
Lead Channel Pacing Threshold Amplitude: 0.75 V
Lead Channel Pacing Threshold Pulse Width: 0.5 ms
Lead Channel Pacing Threshold Pulse Width: 0.5 ms
Lead Channel Sensing Intrinsic Amplitude: 12 mV
Lead Channel Sensing Intrinsic Amplitude: 5 mV
Lead Channel Setting Pacing Amplitude: 2 V
Lead Channel Setting Pacing Amplitude: 2.5 V
MDC IDC LEAD LOCATION: 753859
MDC IDC MSMT BATTERY REMAINING LONGEVITY: 65 mo
MDC IDC MSMT LEADCHNL LV PACING THRESHOLD AMPLITUDE: 1.25 V
MDC IDC MSMT LEADCHNL RA IMPEDANCE VALUE: 410 Ohm
MDC IDC MSMT LEADCHNL RA PACING THRESHOLD PULSEWIDTH: 0.5 ms
MDC IDC MSMT LEADCHNL RV PACING THRESHOLD AMPLITUDE: 0.75 V
MDC IDC PG SERIAL: 7557114
MDC IDC SESS DTM: 20191230133530
MDC IDC SET LEADCHNL LV PACING AMPLITUDE: 2.25 V
MDC IDC SET LEADCHNL LV PACING PULSEWIDTH: 0.5 ms
MDC IDC SET LEADCHNL RV PACING PULSEWIDTH: 0.5 ms
MDC IDC SET LEADCHNL RV SENSING SENSITIVITY: 2 mV
MDC IDC STAT BRADY AS VS PERCENT: 1 %
MDC IDC STAT BRADY RA PERCENT PACED: 1 %

## 2018-11-08 DIAGNOSIS — R809 Proteinuria, unspecified: Secondary | ICD-10-CM | POA: Diagnosis not present

## 2018-11-08 DIAGNOSIS — E559 Vitamin D deficiency, unspecified: Secondary | ICD-10-CM | POA: Diagnosis not present

## 2018-11-08 DIAGNOSIS — Z79899 Other long term (current) drug therapy: Secondary | ICD-10-CM | POA: Diagnosis not present

## 2018-11-08 DIAGNOSIS — I129 Hypertensive chronic kidney disease with stage 1 through stage 4 chronic kidney disease, or unspecified chronic kidney disease: Secondary | ICD-10-CM | POA: Diagnosis not present

## 2018-11-08 DIAGNOSIS — Z1159 Encounter for screening for other viral diseases: Secondary | ICD-10-CM | POA: Diagnosis not present

## 2018-11-08 DIAGNOSIS — N183 Chronic kidney disease, stage 3 (moderate): Secondary | ICD-10-CM | POA: Diagnosis not present

## 2018-11-08 DIAGNOSIS — D509 Iron deficiency anemia, unspecified: Secondary | ICD-10-CM | POA: Diagnosis not present

## 2018-11-09 DIAGNOSIS — Z79899 Other long term (current) drug therapy: Secondary | ICD-10-CM | POA: Diagnosis not present

## 2018-11-09 DIAGNOSIS — N183 Chronic kidney disease, stage 3 (moderate): Secondary | ICD-10-CM | POA: Diagnosis not present

## 2018-11-09 DIAGNOSIS — D509 Iron deficiency anemia, unspecified: Secondary | ICD-10-CM | POA: Diagnosis not present

## 2018-11-09 DIAGNOSIS — E559 Vitamin D deficiency, unspecified: Secondary | ICD-10-CM | POA: Diagnosis not present

## 2018-11-09 DIAGNOSIS — R809 Proteinuria, unspecified: Secondary | ICD-10-CM | POA: Diagnosis not present

## 2018-11-09 DIAGNOSIS — Z1159 Encounter for screening for other viral diseases: Secondary | ICD-10-CM | POA: Diagnosis not present

## 2018-11-09 DIAGNOSIS — I129 Hypertensive chronic kidney disease with stage 1 through stage 4 chronic kidney disease, or unspecified chronic kidney disease: Secondary | ICD-10-CM | POA: Diagnosis not present

## 2018-11-13 DIAGNOSIS — E559 Vitamin D deficiency, unspecified: Secondary | ICD-10-CM | POA: Diagnosis not present

## 2018-11-13 DIAGNOSIS — N183 Chronic kidney disease, stage 3 (moderate): Secondary | ICD-10-CM | POA: Diagnosis not present

## 2018-11-13 DIAGNOSIS — D638 Anemia in other chronic diseases classified elsewhere: Secondary | ICD-10-CM | POA: Diagnosis not present

## 2018-11-13 DIAGNOSIS — R809 Proteinuria, unspecified: Secondary | ICD-10-CM | POA: Diagnosis not present

## 2018-12-23 ENCOUNTER — Ambulatory Visit (INDEPENDENT_AMBULATORY_CARE_PROVIDER_SITE_OTHER): Payer: Medicare HMO | Admitting: *Deleted

## 2018-12-23 ENCOUNTER — Other Ambulatory Visit: Payer: Self-pay

## 2018-12-23 DIAGNOSIS — I442 Atrioventricular block, complete: Secondary | ICD-10-CM | POA: Diagnosis not present

## 2018-12-23 DIAGNOSIS — I5022 Chronic systolic (congestive) heart failure: Secondary | ICD-10-CM

## 2018-12-23 LAB — CUP PACEART REMOTE DEVICE CHECK
Battery Voltage: 2.92 V
Brady Statistic AP VP Percent: 1 %
Brady Statistic AP VS Percent: 1 %
Brady Statistic AS VP Percent: 99 %
Brady Statistic RA Percent Paced: 1 %
Date Time Interrogation Session: 20200330080013
Implantable Lead Implant Date: 20150121
Implantable Lead Implant Date: 20150121
Implantable Lead Location: 753858
Implantable Lead Location: 753860
Lead Channel Impedance Value: 430 Ohm
Lead Channel Impedance Value: 530 Ohm
Lead Channel Pacing Threshold Pulse Width: 0.5 ms
Lead Channel Sensing Intrinsic Amplitude: 4.8 mV
Lead Channel Setting Pacing Amplitude: 2.5 V
Lead Channel Setting Pacing Pulse Width: 0.5 ms
MDC IDC LEAD IMPLANT DT: 20150121
MDC IDC LEAD LOCATION: 753859
MDC IDC MSMT BATTERY REMAINING LONGEVITY: 58 mo
MDC IDC MSMT BATTERY REMAINING PERCENTAGE: 71 %
MDC IDC MSMT LEADCHNL LV IMPEDANCE VALUE: 390 Ohm
MDC IDC MSMT LEADCHNL LV PACING THRESHOLD AMPLITUDE: 1.25 V
MDC IDC MSMT LEADCHNL RA PACING THRESHOLD AMPLITUDE: 0.75 V
MDC IDC MSMT LEADCHNL RA PACING THRESHOLD PULSEWIDTH: 0.5 ms
MDC IDC MSMT LEADCHNL RV PACING THRESHOLD AMPLITUDE: 0.75 V
MDC IDC MSMT LEADCHNL RV PACING THRESHOLD PULSEWIDTH: 0.5 ms
MDC IDC MSMT LEADCHNL RV SENSING INTR AMPL: 12 mV
MDC IDC PG IMPLANT DT: 20150121
MDC IDC SET LEADCHNL LV PACING AMPLITUDE: 2.25 V
MDC IDC SET LEADCHNL LV PACING PULSEWIDTH: 0.5 ms
MDC IDC SET LEADCHNL RA PACING AMPLITUDE: 2 V
MDC IDC SET LEADCHNL RV SENSING SENSITIVITY: 2 mV
MDC IDC STAT BRADY AS VS PERCENT: 1 %
Pulse Gen Model: 3242
Pulse Gen Serial Number: 7557114

## 2018-12-30 NOTE — Progress Notes (Signed)
Remote pacemaker transmission.   

## 2019-01-02 DIAGNOSIS — N184 Chronic kidney disease, stage 4 (severe): Secondary | ICD-10-CM | POA: Diagnosis not present

## 2019-01-02 DIAGNOSIS — I2789 Other specified pulmonary heart diseases: Secondary | ICD-10-CM | POA: Diagnosis not present

## 2019-01-02 DIAGNOSIS — E1165 Type 2 diabetes mellitus with hyperglycemia: Secondary | ICD-10-CM | POA: Diagnosis not present

## 2019-01-02 DIAGNOSIS — E782 Mixed hyperlipidemia: Secondary | ICD-10-CM | POA: Diagnosis not present

## 2019-01-02 DIAGNOSIS — I10 Essential (primary) hypertension: Secondary | ICD-10-CM | POA: Diagnosis not present

## 2019-01-07 DIAGNOSIS — N184 Chronic kidney disease, stage 4 (severe): Secondary | ICD-10-CM | POA: Diagnosis not present

## 2019-01-07 DIAGNOSIS — I10 Essential (primary) hypertension: Secondary | ICD-10-CM | POA: Diagnosis not present

## 2019-01-07 DIAGNOSIS — R197 Diarrhea, unspecified: Secondary | ICD-10-CM | POA: Diagnosis not present

## 2019-01-07 DIAGNOSIS — Z6841 Body Mass Index (BMI) 40.0 and over, adult: Secondary | ICD-10-CM | POA: Diagnosis not present

## 2019-01-07 DIAGNOSIS — Z0001 Encounter for general adult medical examination with abnormal findings: Secondary | ICD-10-CM | POA: Diagnosis not present

## 2019-01-07 DIAGNOSIS — E1121 Type 2 diabetes mellitus with diabetic nephropathy: Secondary | ICD-10-CM | POA: Diagnosis not present

## 2019-01-07 DIAGNOSIS — E1165 Type 2 diabetes mellitus with hyperglycemia: Secondary | ICD-10-CM | POA: Diagnosis not present

## 2019-02-18 ENCOUNTER — Telehealth: Payer: Self-pay | Admitting: Internal Medicine

## 2019-02-18 NOTE — Telephone Encounter (Signed)
New message    Spoke w/pt about appt on 06.23.20 with Dr. Lovena Le. RS pt to 05.28.20 at 940a. Pt will do a phone visit. Pt phone number is listed in appt notes.       Virtual Visit Pre-Appointment Phone Call  "(Name), I am calling you today to discuss your upcoming appointment. We are currently trying to limit exposure to the virus that causes COVID-19 by seeing patients at home rather than in the office."  1. "What is the BEST phone number to call the day of the visit?" - include this in appointment notes  2. Do you have or have access to (through a family member/friend) a smartphone with video capability that we can use for your visit?" a. If yes - list this number in appt notes as cell (if different from BEST phone #) and list the appointment type as a VIDEO visit in appointment notes b. If no - list the appointment type as a PHONE visit in appointment notes  3. Confirm consent - "In the setting of the current Covid19 crisis, you are scheduled for a (phone or video) visit with your provider on (date) at (time).  Just as we do with many in-office visits, in order for you to participate in this visit, we must obtain consent.  If you'd like, I can send this to your mychart (if signed up) or email for you to review.  Otherwise, I can obtain your verbal consent now.  All virtual visits are billed to your insurance company just like a normal visit would be.  By agreeing to a virtual visit, we'd like you to understand that the technology does not allow for your provider to perform an examination, and thus may limit your provider's ability to fully assess your condition. If your provider identifies any concerns that need to be evaluated in person, we will make arrangements to do so.  Finally, though the technology is pretty good, we cannot assure that it will always work on either your or our end, and in the setting of a video visit, we may have to convert it to a phone-only visit.  In either  situation, we cannot ensure that we have a secure connection.  Are you willing to proceed?" STAFF: Did the patient verbally acknowledge consent to telehealth visit? Document YES/NO here: YES  4. Advise patient to be prepared - "Two hours prior to your appointment, go ahead and check your blood pressure, pulse, oxygen saturation, and your weight (if you have the equipment to check those) and write them all down. When your visit starts, your provider will ask you for this information. If you have an Apple Watch or Kardia device, please plan to have heart rate information ready on the day of your appointment. Please have a pen and paper handy nearby the day of the visit as well."  5. Give patient instructions for MyChart download to smartphone OR Doximity/Doxy.me as below if video visit (depending on what platform provider is using)  6. Inform patient they will receive a phone call 15 minutes prior to their appointment time (may be from unknown caller ID) so they should be prepared to answer    Cassandra Parker has been deemed a candidate for a follow-up tele-health visit to limit community exposure during the Covid-19 pandemic. I spoke with the patient via phone to ensure availability of phone/video source, confirm preferred email & phone number, and discuss instructions and expectations.  I reminded Cassandra Sermons  Parker to be prepared with any vital sign and/or heart rhythm information that could potentially be obtained via home monitoring, at the time of her visit. I reminded Cassandra Parker to expect a phone call prior to her visit.  [place] Harriette Ohara 02/18/2019 10:30 AM   INSTRUCTIONS FOR DOWNLOADING THE MYCHART APP TO SMARTPHONE  - The patient must first make sure to have activated MyChart and know their login information - If Apple, go to [employer] and type in MyChart in the search bar and download the app. If Android, ask patient to go to [employer] and type  in [place] in the search bar and download the app. The app is free but as with any other app downloads, their phone may require them to verify saved payment information or Apple/Android password.  - The patient will need to then log into the app with their MyChart username and password, and select [facility] as their healthcare provider to link the account. When it is time for your visit, go to the MyChart app, find appointments, and click Begin Video Visit. Be sure to Select Allow for your device to access the Microphone and Camera for your visit. You will then be connected, and your provider will be with you shortly.  **If they have any issues connecting, or need assistance please contact MyChart service desk [phone])**  **If using a computer, in order to ensure the best quality for their visit they will need to use either of the following Internet Browsers: [employer], or Google Chrome**  IF USING DOXIMITY or DOXY.ME - The patient will receive a link just prior to their visit by text.     FULL LENGTH CONSENT FOR TELE-HEALTH VISIT   I hereby voluntarily request, consent and authorize [facility] and its employed or contracted physicians, physician assistants, nurse practitioners or other licensed health care professionals (the Practitioner), to provide me with telemedicine health care services (the Services") as deemed necessary by the treating Practitioner. I acknowledge and consent to receive the Services by the Practitioner via telemedicine. I understand that the telemedicine visit will involve communicating with the Practitioner through live audiovisual communication technology and the disclosure of certain medical information by electronic transmission. I acknowledge that I have been given the opportunity to request an in-person assessment or other available alternative prior to the telemedicine visit and am voluntarily participating in the telemedicine visit.  I  understand that I have the right to withhold or withdraw my consent to the use of telemedicine in the course of my care at any time, without affecting my right to future care or treatment, and that the Practitioner or I may terminate the telemedicine visit at any time. I understand that I have the right to inspect all information obtained and/or recorded in the course of the telemedicine visit and may receive copies of available information for a reasonable fee.  I understand that some of the potential risks of receiving the Services via telemedicine include:   Delay or interruption in medical evaluation due to technological equipment failure or disruption;  Information transmitted may not be sufficient (e.g. poor resolution of images) to allow for appropriate medical decision making by the Practitioner; and/or   In rare instances, security protocols could fail, causing a breach of personal health information.  Furthermore, I acknowledge that it is my responsibility to provide information about my medical history, conditions and care that is complete and accurate to the best of my ability. I acknowledge  that Practitioner's advice, recommendations, and/or decision may be based on factors not within their control, such as incomplete or inaccurate data provided by me or distortions of diagnostic images or specimens that may result from electronic transmissions. I understand that the practice of medicine is not an exact science and that Practitioner makes no warranties or guarantees regarding treatment outcomes. I acknowledge that I will receive a copy of this consent concurrently upon execution via email to the email address I last provided but may also request a printed copy by calling the office of [facility].    I understand that my insurance will be billed for this visit.   I have read or had this consent read to me.  I understand the contents of this consent, which adequately explains the  benefits and risks of the Services being provided via telemedicine.   I have been provided ample opportunity to ask questions regarding this consent and the Services and have had my questions answered to my satisfaction.  I give my informed consent for the services to be provided through the use of telemedicine in my medical care  By participating in this telemedicine visit I agree to the above.

## 2019-02-20 ENCOUNTER — Telehealth (INDEPENDENT_AMBULATORY_CARE_PROVIDER_SITE_OTHER): Payer: Medicare HMO | Admitting: Internal Medicine

## 2019-02-20 ENCOUNTER — Other Ambulatory Visit: Payer: Self-pay

## 2019-02-20 DIAGNOSIS — I5022 Chronic systolic (congestive) heart failure: Secondary | ICD-10-CM | POA: Diagnosis not present

## 2019-02-20 DIAGNOSIS — Z950 Presence of cardiac pacemaker: Secondary | ICD-10-CM | POA: Diagnosis not present

## 2019-02-20 DIAGNOSIS — I442 Atrioventricular block, complete: Secondary | ICD-10-CM | POA: Diagnosis not present

## 2019-02-20 NOTE — Progress Notes (Signed)
Electrophysiology TeleHealth Note   Due to national recommendations of social distancing due to COVID 19, an audio/video telehealth visit is felt to be most appropriate for this patient at this time.  See MyChart message from today for the patient's consent to telehealth for [facility].   Date:  02/20/2019   ID:  Cassandra Parker, DOB 1941-07-17, MRN [id number]  Location: patient's home  Provider location: [address], [place]  Evaluation Performed: Follow-up visit  PCP:  Manon Hilding, MD  Cardiologist:  Kate Sable, MD Electrophysiologist:  Dr Lovena Le  Chief Complaint:  " I been a little sob."  History of Present Illness:    Cassandra Parker is a 78 y.o. female who presents via audio/video conferencing for a telehealth visit today. She is a pleasant 78 yo woman with chronic systolic heart failure who had normalization of her LV function after biv ICD was inserted. She notes some sob. She admits to dietary indiscretion and has not exercised in the pandemic. Today, she denies symptoms of palpitations, chest pain, dizziness, presyncope, or syncope.  The patient is otherwise without complaint today.  The patient denies symptoms of fevers, chills, cough, or new SOB worrisome for COVID 19.  Past Medical History:  Diagnosis Date  . Acute renal insufficiency 10/15/2013  . Arthritis   . Cancer [facility]) 2008   right breast  . Cancer of breast- s/p chemo and Rt mastectomy 2008 10/13/2013  . Cardiomyopathy ([place])    dating back to 2010  . CHB (complete heart block) ([place]) 10/13/2013  . CHF (congestive heart failure) ([place]) pt stated she was recently diagnosed in another hospital with heart failure and a "weak heart"  . Diabetes mellitus    with neuropathy  . Diabetes mellitus type 2, controlled, without complications ([place]) 05/19/538  . Dyslipidemia 10/13/2013  . History of blood transfusion   . Hypercholesterolemia   . Hypertension   . Mitral stenosis- moderate  with trivial MR  10/15/2013  . Pacemaker 10/23/2014  . Presence of permanent cardiac pacemaker   . Primary osteoarthritis of left knee 11/04/2015  . Syncope 10/13/2013  . Tingling    feet bilat     Past Surgical History:  Procedure Laterality Date  . APPENDECTOMY     mmh  . BI-VENTRICULAR PACEMAKER INSERTION N/A 10/15/2013   Procedure: BI-VENTRICULAR PACEMAKER INSERTION (CRT-P);  Surgeon: Evans Lance, MD;  Location: [facility] CATH LAB;  Service: Cardiovascular;  Laterality: N/A;  . Cardiomyopathy    . CATARACT EXTRACTION W/PHACO  06/29/2011   Procedure: CATARACT EXTRACTION PHACO AND INTRAOCULAR LENS PLACEMENT (IOC);  Surgeon: Tonny Branch;  Location: [facility] ORS;  Service: Ophthalmology;  Laterality: Right;  CDE: 14.41  . CATARACT EXTRACTION W/PHACO  09/04/2011   Procedure: CATARACT EXTRACTION PHACO AND INTRAOCULAR LENS PLACEMENT (IOC);  Surgeon: Tonny Branch;  Location: [facility] ORS;  Service: Ophthalmology;  Laterality: Left;  CDE: 15.21  . CHOLECYSTECTOMY     mmh  . KNEE ARTHROSCOPY     right-mmh  . MASTECTOMY  2008    mmh-right  . TEMPORARY PACEMAKER INSERTION N/A 10/13/2013   Procedure: TEMPORARY PACEMAKER INSERTION;  Surgeon: Jolaine Artist, MD;  Location: [facility] CATH LAB;  Service: Cardiovascular;  Laterality: N/A;  . TOTAL KNEE ARTHROPLASTY Left 11/04/2015   Procedure: LEFT TOTAL KNEE ARTHROPLASTY;  Surgeon: Rod Can, MD;  Location: [facility] ORS;  Service: Orthopedics;  Laterality: Left;    Current Outpatient Medications  Medication Sig Dispense Refill  . aspirin EC  81 MG tablet Take 81 mg by mouth daily.    . carvedilol (COREG) 6.25 MG tablet Take 0.5 tablets (3.125 mg total) by mouth 2 (two) times daily. 30 tablet 4  . chlorthalidone (HYGROTON) 25 MG tablet Take 25 mg by mouth daily.     . Cholecalciferol (VITAMIN D) 2000 UNITS tablet Take 2,000 Units by mouth daily.     Marland Kitchen glipiZIDE (GLUCOTROL XL) 5 MG 24 hr tablet Take 5 mg by mouth 2 (two) times daily.     Marland Kitchen losartan (COZAAR) 100 MG tablet TAKE  1 TABLET ONE TIME DAILY (DISCONTINUE  VALSARTAN 160MG  ) 90 tablet 3  . Omega-3 Fatty Acids (FISH OIL) 1200 MG CAPS Take 1 capsule by mouth daily.    . pioglitazone (ACTOS) 15 MG tablet Take 1 tablet by mouth daily.    . pravastatin (PRAVACHOL) 10 MG tablet Take 10 mg by mouth at bedtime.      No current facility-administered medications for this visit.     Allergies:   Patient has no known allergies.   Social History:  The patient  reports that she has never smoked. She has never used smokeless tobacco. She reports that she does not drink alcohol or use drugs.   Family History:  The patient's family history includes Diabetes in her father; Hypertension in her father.   ROS:  Please see the history of present illness.   All other systems are personally reviewed and negative.    Exam:    Vital Signs:  Wt. - 241    Labs/Other Tests and Data Reviewed:    Recent Labs: No results found for requested labs within last 8760 hours.   Wt Readings from Last 3 Encounters:  08/01/18 232 lb (105.2 kg)  03/15/18 235 lb (106.6 kg)  07/04/17 234 lb (106.1 kg)     Other studies personally reviewed:  Last device remote is reviewed from [facility] PDF dated 12/23/18 which reveals normal device function, no arrhythmias    ASSESSMENT & PLAN:    1.  Chronic systolic heart failure - she has had some increased sob. She has taken a chlorthalidone once or twice a week. I asked her to avoid salty foods. She will likely need lasix instead of chlorthalidone in the long term. She will see Dr. Raliegh Ip back in a couple of months. 2. ICD - her [employer] device is working normally.  3. Obesity - I encouraged her to eat less. She admits to a sedentary lifestyle.  4. HTN - she is not checking her bp and I encouraged her to restart doing so. 5. COVID 19 screen The patient denies symptoms of COVID 19 at this time.  The importance of social distancing was discussed today.  Follow-up:  12 months Next remote: 7/20   Current medicines are reviewed at length with the patient today.   The patient does not have concerns regarding her medicines.  The following changes were made today:  none  Labs/ tests ordered today include: none No orders of the defined types were placed in this encounter.    Patient Risk:  after full review of this patients clinical status, I feel that they are at moderate risk at this time.  Today, I have spent 15 minutes with the patient with telehealth technology discussing all of the above.    Signed, Cristopher Peru, MD  02/20/2019 9:51 AM     [address] [place] [address] [phone] (office) [phone] (fax)

## 2019-02-27 ENCOUNTER — Other Ambulatory Visit: Payer: Self-pay | Admitting: Cardiovascular Disease

## 2019-03-10 ENCOUNTER — Telehealth: Payer: Self-pay | Admitting: *Deleted

## 2019-03-10 NOTE — Telephone Encounter (Signed)
Contacted about weights. Says she weighs at different times of the day and does not write them down. Advised to start weighing same time, same amount of clothes, same scale daily and record readings. Informed about recommendations from Dr. Harl Bowie. Verbalized understanding.

## 2019-03-10 NOTE — Telephone Encounter (Signed)
Patient contacted office d/t continued SOB. Patient said she was instructed to call our office if sob continued by Dr Lovena Le at recent [place]. Currently taking furosemide 40 mg daily and says its helped swelling but still SOB. Offered OV for today @1 :30 pm w/[facility] but declined and says she will wait until 06/25 OV w/[facility]. Advised to contact our office back if she changed her mind. Verbalized understanding.

## 2019-03-10 NOTE — Telephone Encounter (Signed)
I dont see lasix on her med list, was she given a Rx at her last visit? How long has she been taking the lasix? Is she able to weigh herself at home?    J Vershawn Westrup MD

## 2019-03-10 NOTE — Telephone Encounter (Addendum)
Furosemide 40 mg daily as needed was given to patient by her nephrologist Lowanda Foster) at the June 2019. Took daily for 2 days after VV on 02/20/2019, then skipped a day, then started taking every other day. Currently taking QOD. Weight 05/28  241 lbs Weight  06/13 241 lbs  Weight today 232lbs

## 2019-03-10 NOTE — Telephone Encounter (Signed)
Im confused by her weight, did she really lose 9 lbs from in 2 days? I would keep taking her diuretic as presribed and we will evaluate everything in detail at her appt next week, if significant worsening in symptoms call us back

## 2019-03-18 ENCOUNTER — Encounter: Payer: Medicare PPO | Admitting: Internal Medicine

## 2019-03-19 ENCOUNTER — Encounter: Payer: Self-pay | Admitting: *Deleted

## 2019-03-20 ENCOUNTER — Other Ambulatory Visit: Payer: Self-pay | Admitting: *Deleted

## 2019-03-20 ENCOUNTER — Ambulatory Visit: Payer: Medicare HMO | Admitting: Physician Assistant

## 2019-03-20 ENCOUNTER — Encounter: Payer: Self-pay | Admitting: Physician Assistant

## 2019-03-20 ENCOUNTER — Telehealth: Payer: Self-pay | Admitting: Physician Assistant

## 2019-03-20 ENCOUNTER — Other Ambulatory Visit: Payer: Self-pay

## 2019-03-20 VITALS — BP 156/64 | HR 80 | Ht 63.0 in | Wt 238.0 lb

## 2019-03-20 DIAGNOSIS — I5043 Acute on chronic combined systolic (congestive) and diastolic (congestive) heart failure: Secondary | ICD-10-CM

## 2019-03-20 DIAGNOSIS — N183 Chronic kidney disease, stage 3 unspecified: Secondary | ICD-10-CM

## 2019-03-20 DIAGNOSIS — I442 Atrioventricular block, complete: Secondary | ICD-10-CM | POA: Diagnosis not present

## 2019-03-20 DIAGNOSIS — I5022 Chronic systolic (congestive) heart failure: Secondary | ICD-10-CM | POA: Diagnosis not present

## 2019-03-20 MED ORDER — POTASSIUM CHLORIDE ER 10 MEQ PO TBCR: 10.0000 meq | EXTENDED_RELEASE_TABLET | Freq: Every day | ORAL | 1 refills | Status: DC

## 2019-03-20 MED ORDER — FUROSEMIDE 40 MG PO TABS: 40.0000 mg | ORAL_TABLET | Freq: Every day | ORAL | 1 refills | Status: DC

## 2019-03-20 NOTE — Patient Instructions (Addendum)
Medication Instructions:   Your physician has recommended you make the following change in your medication:   Take furosemide 40 mg daily for 5 days, then daily as needed for weight gain of 3 lbs in one day or 5 lbs in one week  Start potassium 10 meq daily for 5 days, then daily as needed with furosemide  Continue all other medications the same  Labwork:  Your physician recommends that you return for lab work in: TODAY to check your BMET & Mg levels. You may have this done at [facility].  Testing/Procedures: Your physician has requested that you have an echocardiogram. Echocardiography is a painless test that uses sound waves to create images of your heart. It provides your doctor with information about the size and shape of your heart and how well your heart's chambers and valves are working. This procedure takes approximately one hour. There are no restrictions for this procedure.  Follow-Up:  Your physician recommends that you schedule a follow-up appointment in: 2-3 months with Dr. Bronson Ing.  Please call our office if you need to be seen sooner  Any Other Special Instructions Will Be Listed Below (If Applicable).  Please limit your sodium intake to no more than 500 mg per meal or 1500 mg daily.  Please limit your fluid/liquid intake to no more than 1500 ml or 1&1/2 quart daily Your physician recommends that you weigh, daily, at the same time every day, and in the same amount of clothing. Please record your daily weights on the handout provided and bring it to your next appointment.   If you need a refill on your cardiac medications before your next appointment, please call your pharmacy. Fluid Restriction With some health conditions, you must restrict your fluid intake. This means that you need to limit the amount of fluid that you drink each day (fluid restriction). When you have a fluid restriction, you must carefully measure and keep track of the amount of  fluid that you drink. Your health care provider will identify the specific amount of fluid you are allowed each day (fluid allowance). This amount may depend on several things, such as:  How well your kidneys function.  How much fluid you are keeping (retaining) in your body tissues.  Your blood pressure.  Your heart function.  Your blood sodium level. What is my plan? Your health care provider recommends that you limit your fluid intake to 1500 ml or 1&1/2 quart per day. What counts toward my fluid intake? Your fluid intake includes all liquids that you drink, as well as any foods that become liquid at room temperature. The following are examples of some fluids that you will have to restrict:  Tea, coffee, soda, lemonade, milk, water, juice, sports drinks, and nutritional supplement beverages.  Alcoholic beverages.  Cream.  Gravy.  Ice cubes.  Soup and broth. The following are examples of foods that become liquid at room temperature. These foods will also count toward your fluid intake.  Ice cream and ice milk.  Frozen yogurt and sherbet.  Frozen ice pops.  Flavored gelatin. How do I keep track of my fluid intake? Each morning, fill a jug with the amount of water that is equal to your daily fluid allowance. You can use this water as a guideline for fluid allowance. Each time you take in any form of fluid (including ice cubes and foods that become liquid at room temperature), pour an equal amount of water out of the container. This helps you  to see how much fluid you are taking in. It also helps you to see how much more fluid you can take in during the rest of the day. The following conversions may also be helpful in measuring your fluid intake:  1 cup equals 8 oz (240 mL).   cup equals 6 oz (180 mL).  ? cup equals 5? oz (160 mL).   cup equals 4 oz (120 mL).  ? cup equals 2? oz (80 mL).   cup equals 2 oz (60 mL).  2 Tbsp equals 1 oz (30 mL). What are tips for  following this plan? General instructions  Make sure that you stay within your recommended fluid allowance each day. Always measure and keep track of your fluids (including ice cubes and foods that become liquid at room temperature).  Use small cups and glasses and learn to sip fluids slowly.  Try frozen fruits between meals, such as grapes or strawberries. These can satisfy thirst without adding to your fluid intake.  Swallow your pills along with meals or soft foods such as applesauce or mashed potatoes, instead of with liquids. Doing this helps you to save your fluid allowance for something that you enjoy. Weigh yourself each day     Weigh yourself every day. Keeping track of your daily weight can help you and your health care provider to notice as soon as possible if you are retaining too much fluid in your body.  Follow this sequence every morning: 1. Urinate. 2. Weigh yourself. 3. Eat breakfast.  Wear the same amount of clothing each time you weigh yourself.  Write down your daily weight. Give this weight record to your health care provider. If your weight is going up, you may be retaining too much fluid. Every 1 lb (0.45 kg) of body weight that you gain is a sign that your body is retaining 2 cups (480 mL) of fluid.  Manage your thirst  Add lemon juice or a slice of fresh lemon to water or ice. Doing this helps to satisfy your thirst.  Freeze fruit juice or water in an ice cube tray. Use this as part of your fluid allowance. These cubes are useful for quenching your thirst. Before you freeze the juice or water, measure how much liquid you use to fill a cube section of the ice tray. Subtract this amount from your day's allowance each time you consume a frozen cube.  Avoid salty (high-sodium) foods. These foods make you thirsty and make it more difficult to stay within your daily fluid allowance.  Keep the temperature in your home at a cooler level.  Keep the air in your home  as humid as possible. Dry air increases thirst.  Avoid being out in the hot sun, which can cause you to sweat and become thirsty.  To help avoid dry mouth, brush your teeth often or rinse out your mouth with mouthwash. Lemon wedges, hard sour candies, chewing gum, or breath spray may also help to moisten your mouth. What are some signs that I may be taking in too much fluid? You may be taking in too much fluid if:  Your weight increases. Contact your health care provider if you gain weight rapidly.  Your face, hands, legs, feet, and abdomen start to swell.  You have trouble breathing. Summary  With some health conditions, you must limit (restrict) your fluid intake. This means that you need to limit the amount of fluid you drink each day (fluid restriction). Your health care  provider will identify the specific amount of fluid that you are allowed each day.  When you have a fluid restriction, you must carefully measure and keep track of the amount of fluid that you drink.  Your fluid intake includes all liquids that you drink, as well as any foods that become liquid at room temperature (such as ice cream and gelatin).  You may be taking in too much fluid if your weight increases, your body starts to swell, or you have trouble breathing. This information is not intended to replace advice given to you by your health care provider. Make sure you discuss any questions you have with your health care provider. Document Released: 07/09/2007 Document Revised: 05/16/2017 Document Reviewed: 05/16/2017 Elsevier Interactive Patient Education  2019 [employer].  Low-Sodium Eating Plan Sodium, which is an element that makes up salt, helps you maintain a healthy balance of fluids in your body. Too much sodium can increase your blood pressure and cause fluid and waste to be held in your body. Your health care provider or dietitian may recommend following this plan if you have high blood pressure  (hypertension), kidney disease, liver disease, or heart failure. Eating less sodium can help lower your blood pressure, reduce swelling, and protect your heart, liver, and kidneys. What are tips for following this plan? General guidelines  Please limit your sodium intake to 1,500 mg (milligrams) of sodium each day. Reading food labels   The Nutrition Facts label lists the amount of sodium in one serving of the food. If you eat more than one serving, you must multiply the listed amount of sodium by the number of servings.  Choose foods with less than 140 mg of sodium per serving.  Avoid foods with 300 mg of sodium or more per serving. Shopping  Look for lower-sodium products, often labeled as "low-sodium" or "no salt added."  Always check the sodium content even if foods are labeled as "unsalted" or "no salt added".  Buy fresh foods. ? Avoid canned foods and premade or frozen meals. ? Avoid canned, cured, or processed meats  Buy breads that have less than 80 mg of sodium per slice. Cooking  Eat more home-cooked food and less restaurant, buffet, and fast food.  Avoid adding salt when cooking. Use salt-free seasonings or herbs instead of table salt or sea salt. Check with your health care provider or pharmacist before using salt substitutes.  Cook with plant-based oils, such as canola, sunflower, or olive oil. Meal planning  When eating at a restaurant, ask that your food be prepared with less salt or no salt, if possible.  Avoid foods that contain MSG (monosodium glutamate). MSG is sometimes added to [place] food, bouillon, and some canned foods. What foods are recommended? The items listed may not be a complete list. Talk with your dietitian about what dietary choices are best for you. Grains Low-sodium cereals, including oats, puffed wheat and rice, and shredded wheat. Low-sodium crackers. Unsalted rice. Unsalted pasta. Low-sodium bread. Whole-grain breads and whole-grain  pasta. Vegetables Fresh or frozen vegetables. "No salt added" canned vegetables. "No salt added" tomato sauce and paste. Low-sodium or reduced-sodium tomato and vegetable juice. Fruits Fresh, frozen, or canned fruit. Fruit juice. Meats and other protein foods Fresh or frozen (no salt added) meat, poultry, seafood, and fish. Low-sodium canned tuna and salmon. Unsalted nuts. Dried peas, beans, and lentils without added salt. Unsalted canned beans. Eggs. Unsalted nut butters. Dairy Milk. Soy milk. Cheese that is naturally low in sodium, such  as ricotta cheese, fresh mozzarella, or Swiss cheese Low-sodium or reduced-sodium cheese. Cream cheese. Yogurt. Fats and oils Unsalted butter. Unsalted margarine with no trans fat. Vegetable oils such as canola or olive oils. Seasonings and other foods Fresh and dried herbs and spices. Salt-free seasonings. Low-sodium mustard and ketchup. Sodium-free salad dressing. Sodium-free light mayonnaise. Fresh or refrigerated horseradish. Lemon juice. Vinegar. Homemade, reduced-sodium, or low-sodium soups. Unsalted popcorn and pretzels. Low-salt or salt-free chips. What foods are not recommended? The items listed may not be a complete list. Talk with your dietitian about what dietary choices are best for you. Grains Instant hot cereals. Bread stuffing, pancake, and biscuit mixes. Croutons. Seasoned rice or pasta mixes. Noodle soup cups. Boxed or frozen macaroni and cheese. Regular salted crackers. Self-rising flour. Vegetables Sauerkraut, pickled vegetables, and relishes. Olives. [place] fries. Onion rings. Regular canned vegetables (not low-sodium or reduced-sodium). Regular canned tomato sauce and paste (not low-sodium or reduced-sodium). Regular tomato and vegetable juice (not low-sodium or reduced-sodium). Frozen vegetables in sauces. Meats and other protein foods Meat or fish that is salted, canned, smoked, spiced, or pickled. Bacon, ham, sausage, hotdogs, corned  beef, chipped beef, packaged lunch meats, salt pork, jerky, pickled herring, anchovies, regular canned tuna, sardines, salted nuts. Dairy Processed cheese and cheese spreads. Cheese curds. Blue cheese. Feta cheese. String cheese. Regular cottage cheese. Buttermilk. Canned milk. Fats and oils Salted butter. Regular margarine. Ghee. Bacon fat. Seasonings and other foods Onion salt, garlic salt, seasoned salt, table salt, and sea salt. Canned and packaged gravies. Worcestershire sauce. Tartar sauce. Barbecue sauce. Teriyaki sauce. Soy sauce, including reduced-sodium. Steak sauce. Fish sauce. Oyster sauce. Cocktail sauce. Horseradish that you find on the shelf. Regular ketchup and mustard. Meat flavorings and tenderizers. Bouillon cubes. Hot sauce and Tabasco sauce. Premade or packaged marinades. Premade or packaged taco seasonings. Relishes. Regular salad dressings. Salsa. Potato and tortilla chips. Corn chips and puffs. Salted popcorn and pretzels. Canned or dried soups. Pizza. Frozen entrees and pot pies. Summary  Eating less sodium can help lower your blood pressure, reduce swelling, and protect your heart, liver, and kidneys.  Most people on this plan should limit their sodium intake to 1,500-2,000 mg (milligrams) of sodium each day.  Canned, boxed, and frozen foods are high in sodium. Restaurant foods, fast foods, and pizza are also very high in sodium. You also get sodium by adding salt to food.  Try to cook at home, eat more fresh fruits and vegetables, and eat less fast food, canned, processed, or prepared foods. This information is not intended to replace advice given to you by your health care provider. Make sure you discuss any questions you have with your health care provider. Document Released: 03/03/2002 Document Revised: 09/04/2016 Document Reviewed: 09/04/2016 Elsevier Interactive Patient Education  2019 [employer].

## 2019-03-20 NOTE — Telephone Encounter (Signed)
°  Precert needed for: Echo   Location: [facility]     Date:  April 10, 2019

## 2019-03-20 NOTE — Progress Notes (Signed)
Cardiology Office Note   Date:  03/20/2019   ID:  Cassandra, Parker 1941/08/10, MRN [id number]  PCP:  Manon Hilding, MD Cardiologist:  Kate Sable, MD 08/01/2018 Electrophysiologist: Cristopher Peru, MD  Rosaria Ferries, PA-C   No chief complaint on file.   History of Present Illness: Cassandra Parker is a 78 y.o. female with a history of IDDM, HTN, HLD, breast CA s/p R mastectomy, CHB early 2015 w/ EF 35-40% s/p BiV SJM PPM w/ BiV pacing > 99%, EF 55-60% echo 05/2014  Cassandra Parker presents for cardiology follow up.   She started noticing LE edema and DOE for about a month.  She had a virtual visit with Dr. Lovena Le on 5/28, she was taking chlorthalidone once or twice a week.  Since that she will likely need Lasix long-term.  She called the office on 06/15 and was on Lasix at that time.  She was told to continue taking the Lasix and f/u in the office. She has been weighing herself daily since 06/15. Her weight has ranged from [place]. It would improve w/ Lasix 40 mg but go back up.   She could not take Lasix daily due to cramping.   No labs since April, Cr 2.33 and K+ 4.6 at that time  Sometimes she wakes in the night due to SOB. No orthopnea, sleeps on 2 pillows chronically.   She is compliant with her pacemaker checks and has one scheduled for next week.  She has not had chest pain.   Past Medical History:  Diagnosis Date  . Acute renal insufficiency 10/15/2013  . Arthritis   . Cancer [facility]) 2008   right breast  . Cancer of breast- s/p chemo and Rt mastectomy 2008 10/13/2013  . Cardiomyopathy ([place])    dating back to 2010  . CHB (complete heart block) ([place]) 10/13/2013  . Chronic combined systolic and diastolic congestive heart failure ([place])   . Diabetes mellitus    with neuropathy  . Diabetes mellitus type 2, controlled, without complications ([place]) 7/51/0258  . Dyslipidemia 10/13/2013  . History of blood transfusion   . Hypercholesterolemia   .  Hypertension   . Mitral stenosis- moderate with trivial MR  10/15/2013  . Pacemaker 10/23/2014  . Presence of permanent cardiac pacemaker   . Primary osteoarthritis of left knee 11/04/2015  . Syncope 10/13/2013  . Tingling    feet bilat     Past Surgical History:  Procedure Laterality Date  . APPENDECTOMY     mmh  . BI-VENTRICULAR PACEMAKER INSERTION N/A 10/15/2013   Procedure: BI-VENTRICULAR PACEMAKER INSERTION (CRT-P);  Surgeon: Evans Lance, MD;  Location: [facility] CATH LAB;  Service: Cardiovascular;  Laterality: N/A;  . Cardiomyopathy    . CATARACT EXTRACTION W/PHACO  06/29/2011   Procedure: CATARACT EXTRACTION PHACO AND INTRAOCULAR LENS PLACEMENT (IOC);  Surgeon: Tonny Branch;  Location: [facility] ORS;  Service: Ophthalmology;  Laterality: Right;  CDE: 14.41  . CATARACT EXTRACTION W/PHACO  09/04/2011   Procedure: CATARACT EXTRACTION PHACO AND INTRAOCULAR LENS PLACEMENT (IOC);  Surgeon: Tonny Branch;  Location: [facility] ORS;  Service: Ophthalmology;  Laterality: Left;  CDE: 15.21  . CHOLECYSTECTOMY     mmh  . KNEE ARTHROSCOPY     right-mmh  . MASTECTOMY  2008    mmh-right  . TEMPORARY PACEMAKER INSERTION N/A 10/13/2013   Procedure: TEMPORARY PACEMAKER INSERTION;  Surgeon: Jolaine Artist, MD;  Location: [facility] CATH LAB;  Service: Cardiovascular;  Laterality: N/A;  . TOTAL KNEE  ARTHROPLASTY Left 11/04/2015   Procedure: LEFT TOTAL KNEE ARTHROPLASTY;  Surgeon: Rod Can, MD;  Location: [facility] ORS;  Service: Orthopedics;  Laterality: Left;    Current Outpatient Medications  Medication Sig Dispense Refill  . aspirin EC 81 MG tablet Take 81 mg by mouth daily.    . carvedilol (COREG) 6.25 MG tablet Take 0.5 tablets (3.125 mg total) by mouth 2 (two) times daily. 30 tablet 4  . chlorthalidone (HYGROTON) 25 MG tablet Take 25 mg by mouth daily.     . Cholecalciferol (VITAMIN D) 2000 UNITS tablet Take 2,000 Units by mouth daily.     . furosemide (LASIX) 40 MG tablet Take 40 mg by mouth daily as needed.    Marland Kitchen  glipiZIDE (GLUCOTROL XL) 5 MG 24 hr tablet Take 5 mg by mouth 2 (two) times daily.     Marland Kitchen losartan (COZAAR) 100 MG tablet TAKE 1 TABLET EVERY DAY 90 tablet 3  . Omega-3 Fatty Acids (FISH OIL) 1200 MG CAPS Take 1 capsule by mouth daily.    . pioglitazone (ACTOS) 15 MG tablet Take 1 tablet by mouth daily.    . pravastatin (PRAVACHOL) 10 MG tablet Take 10 mg by mouth at bedtime.      No current facility-administered medications for this visit.     Allergies:   Patient has no known allergies.    Social History:  The patient  reports that she has never smoked. She has never used smokeless tobacco. She reports that she does not drink alcohol or use drugs.   Family History:  The patient's family history includes Diabetes in her father; Hypertension in her father.  She indicated that the status of her father is unknown. She indicated that the status of her neg hx is unknown.    ROS:  Please see the history of present illness. All other systems are reviewed and negative.    PHYSICAL EXAM: VS:  BP (!) 156/64   Pulse 80   Ht 5\' 3"  (1.6 m)   Wt 238 lb (108 kg)   SpO2 90%   BMI 42.16 kg/m  , BMI Body mass index is 42.16 kg/m. GEN: Well nourished, well developed, female in no acute distress HEENT: normal for age  Neck: JVD 9-10 cm, no carotid bruit, no masses Cardiac: RRR; soft murmur, no rubs, or gallops Respiratory: decreased breath sounds bases with some rales bilaterally, normal work of breathing GI: soft, nontender, nondistended, + BS MS: no deformity or atrophy; 2+ lower extremity edema; distal pulses are 2+ in upper extremities, difficult to palpate in lower extremities due to edema Skin: warm and dry, no rash Neuro:  Strength and sensation are intact Psych: euthymic mood, full affect   EKG:  EKG is ordered today. The ekg ordered today demonstrates sinus rhythm with biventricular pacing, heart rate 77  ECHO: 06/04/2014 - Left ventricle: The cavity size was normal. Systolic  function was  normal. The estimated ejection fraction was in the range of 55%  to 60%. Wall motion was normal; there were no regional wall  motion abnormalities. Doppler parameters are consistent with  abnormal left ventricular relaxation (grade 1 diastolic  dysfunction). Doppler parameters are consistent with both  elevated ventricular end-diastolic filling pressure and elevated  left atrial filling pressure. Mild to moderate concentric and  severe, focal basal septal hypertrophy.  - Aortic valve: Moderately calcified annulus. Trileaflet.  - Mitral valve: Severe, primarily posterior mitral annular  calcification. Moderately thickened leaflets . Restricted  posterior leaflet motion. Mild  mitral stenosis. Mean gradient 3  mmHg, peak gradient 12 mmHg. Valve area by pressure half-time:  2.14 cm^2. Valve area by continuity equation (using LVOT flow):  2.12 cm^2.  - Left atrium: The atrium was moderately dilated.  - Pulmonic valve: Mildly thickened leaflets.  - Pericardium, extracardiac: A trivial pericardial effusion was  identified.   Impressions:   - When compared to the report dated 0/98/11, LV systolic function  has normalized. Mitral stenosis severity has decreased from  moderate to mild.   Recent Labs: No results found for requested labs within last 8760 hours.  CBC    Component Value Date/Time   WBC 9.5 12/14/2015 2159   RBC 3.41 (L) 12/14/2015 2159   HGB 9.9 (L) 12/14/2015 2159   HCT 31.6 (L) 12/14/2015 2159   PLT 308 12/14/2015 2159   MCV 92.7 12/14/2015 2159   MCH 29.0 12/14/2015 2159   MCHC 31.3 12/14/2015 2159   RDW 13.4 12/14/2015 2159   LYMPHSABS 3.2 12/14/2015 2159   MONOABS 0.7 12/14/2015 2159   EOSABS 0.3 12/14/2015 2159   BASOSABS 0.0 12/14/2015 2159   CMP Latest Ref Rng & Units  01/02/2019 11/05/2015 10/29/2015  Glucose 65 - 99 mg/dL  91  324(H) 172(H)  BUN 6 - 20 mg/dL  63  54(H) 53(H)  Creatinine 0.44 - 1.00 mg/dL  2.33   2.06(H) 1.97(H)  Sodium 135 - 145 mmol/L  144  135 139  Potassium 3.5 - 5.1 mmol/L  4.6  4.7 4.2  Chloride 101 - 111 mmol/L  107  108 105  CO2 22 - 32 mmol/L  20  18(L) 23  Calcium 8.9 - 10.3 mg/dL    8.3(L) 9.6  Total Protein 6.5 - 8.1 g/dL   - 7.9  Total Bilirubin 0.3 - 1.2 mg/dL   - 0.2(L)  Alkaline Phos 38 - 126 U/L   - 72  AST 15 - 41 U/L   - 18  ALT 14 - 54 U/L  - 15     Lipid Panel from PCP, 01/02/2019   Total cholesterol  231   Triglycerides  102   HDL  82   LDL  129       Wt Readings from Last 3 Encounters:  03/20/19 238 lb (108 kg)  08/01/18 232 lb (105.2 kg)  03/15/18 235 lb (106.6 kg)     Other studies Reviewed: Additional studies/ records that were reviewed today include: Office notes, hospital records and testing.  ASSESSMENT AND PLAN:  1.  Acute on chronic combined systolic and diastolic CHF: - Continue daily weights - Watch the sodium carefully in the food she eats, she may be getting hidden sodium - Make the Lasix 40 mg daily for 5 days, then daily as needed with parameters - Give potassium 10 mEq daily with the Lasix. - Check a BMET today, if her renal function is worse, try to get an earlier follow-up with her Nephrologist - Check an echocardiogram, hopefully her EF is still preserved  2.  Chronic kidney disease: - On her most recent labs, her BUN was 16 creatinine 2.33, she may have been a little dry -She follows with a Nephrologist, but her appointment is not until August. - Because of her cramping, she may need potassium supplementation with the Lasix.  Check a BMET and a magnesium level today - If her renal function is worsened, try to get an earlier follow-up with her Nephrologist  3.  Complete heart block, status post [facility] pacemaker -  Her pacemaker was previously functioning normally on pacemaker checks, she is compliant with them - Keep appointment for phone check next week   Current medicines are reviewed at length with the patient  today.  The patient does not have concerns regarding medicines.  The following changes have been made: Increase Lasix temporarily  Labs/ tests ordered today include:   Orders Placed This Encounter  Procedures  . Basic metabolic panel  . Magnesium  . EKG 12-Lead  . ECHOCARDIOGRAM COMPLETE     Disposition:   FU with Kate Sable, MD  Signed, Rosaria Ferries, PA-C  03/20/2019 1:25 PM    [facility] Phone: [phone]; Fax: [phone]

## 2019-03-21 ENCOUNTER — Other Ambulatory Visit: Payer: Self-pay | Admitting: *Deleted

## 2019-03-21 MED ORDER — POTASSIUM CHLORIDE ER 10 MEQ PO TBCR: 10.0000 meq | EXTENDED_RELEASE_TABLET | Freq: Every day | ORAL | 1 refills | Status: DC

## 2019-03-21 MED ORDER — FUROSEMIDE 40 MG PO TABS: 40.0000 mg | ORAL_TABLET | Freq: Every day | ORAL | 1 refills | Status: DC

## 2019-03-24 ENCOUNTER — Ambulatory Visit (INDEPENDENT_AMBULATORY_CARE_PROVIDER_SITE_OTHER): Payer: Medicare HMO | Admitting: *Deleted

## 2019-03-24 DIAGNOSIS — I442 Atrioventricular block, complete: Secondary | ICD-10-CM

## 2019-03-24 DIAGNOSIS — I429 Cardiomyopathy, unspecified: Secondary | ICD-10-CM

## 2019-03-24 LAB — CUP PACEART REMOTE DEVICE CHECK
Battery Remaining Longevity: 52 mo
Battery Remaining Percentage: 63 %
Battery Voltage: 2.9 V
Brady Statistic AP VP Percent: 1 %
Brady Statistic AP VS Percent: 1 %
Brady Statistic AS VP Percent: 99 %
Brady Statistic AS VS Percent: 1 %
Brady Statistic RA Percent Paced: 1 %
Date Time Interrogation Session: 20200629111513
Implantable Lead Implant Date: 20150121
Implantable Lead Implant Date: 20150121
Implantable Lead Implant Date: 20150121
Implantable Lead Location: 753858
Implantable Lead Location: 753859
Implantable Lead Location: 753860
Implantable Pulse Generator Implant Date: 20150121
Lead Channel Impedance Value: 390 Ohm
Lead Channel Impedance Value: 410 Ohm
Lead Channel Impedance Value: 510 Ohm
Lead Channel Pacing Threshold Amplitude: 0.75 V
Lead Channel Pacing Threshold Amplitude: 0.75 V
Lead Channel Pacing Threshold Amplitude: 1.25 V
Lead Channel Pacing Threshold Pulse Width: 0.5 ms
Lead Channel Pacing Threshold Pulse Width: 0.5 ms
Lead Channel Pacing Threshold Pulse Width: 0.5 ms
Lead Channel Sensing Intrinsic Amplitude: 12 mV
Lead Channel Sensing Intrinsic Amplitude: 4 mV
Lead Channel Setting Pacing Amplitude: 2 V
Lead Channel Setting Pacing Amplitude: 2.25 V
Lead Channel Setting Pacing Amplitude: 2.5 V
Lead Channel Setting Pacing Pulse Width: 0.5 ms
Lead Channel Setting Pacing Pulse Width: 0.5 ms
Lead Channel Setting Sensing Sensitivity: 2 mV
Pulse Gen Model: 3242
Pulse Gen Serial Number: 7557114

## 2019-04-01 NOTE — Progress Notes (Signed)
Remote pacemaker transmission.   

## 2019-04-10 ENCOUNTER — Other Ambulatory Visit: Payer: Medicare HMO

## 2019-04-23 ENCOUNTER — Other Ambulatory Visit: Payer: Self-pay

## 2019-04-23 ENCOUNTER — Ambulatory Visit (INDEPENDENT_AMBULATORY_CARE_PROVIDER_SITE_OTHER): Payer: Medicare HMO

## 2019-04-23 DIAGNOSIS — I5043 Acute on chronic combined systolic (congestive) and diastolic (congestive) heart failure: Secondary | ICD-10-CM

## 2019-04-25 DIAGNOSIS — E785 Hyperlipidemia, unspecified: Secondary | ICD-10-CM | POA: Diagnosis not present

## 2019-04-25 DIAGNOSIS — I10 Essential (primary) hypertension: Secondary | ICD-10-CM | POA: Diagnosis not present

## 2019-05-05 DIAGNOSIS — E7800 Pure hypercholesterolemia, unspecified: Secondary | ICD-10-CM | POA: Diagnosis not present

## 2019-05-05 DIAGNOSIS — E1121 Type 2 diabetes mellitus with diabetic nephropathy: Secondary | ICD-10-CM | POA: Diagnosis not present

## 2019-05-05 DIAGNOSIS — E1165 Type 2 diabetes mellitus with hyperglycemia: Secondary | ICD-10-CM | POA: Diagnosis not present

## 2019-05-05 DIAGNOSIS — E782 Mixed hyperlipidemia: Secondary | ICD-10-CM | POA: Diagnosis not present

## 2019-05-05 DIAGNOSIS — N184 Chronic kidney disease, stage 4 (severe): Secondary | ICD-10-CM | POA: Diagnosis not present

## 2019-05-05 DIAGNOSIS — I10 Essential (primary) hypertension: Secondary | ICD-10-CM | POA: Diagnosis not present

## 2019-05-08 DIAGNOSIS — E1165 Type 2 diabetes mellitus with hyperglycemia: Secondary | ICD-10-CM | POA: Diagnosis not present

## 2019-05-08 DIAGNOSIS — Z1389 Encounter for screening for other disorder: Secondary | ICD-10-CM | POA: Diagnosis not present

## 2019-05-08 DIAGNOSIS — E1121 Type 2 diabetes mellitus with diabetic nephropathy: Secondary | ICD-10-CM | POA: Diagnosis not present

## 2019-05-08 DIAGNOSIS — Z853 Personal history of malignant neoplasm of breast: Secondary | ICD-10-CM | POA: Diagnosis not present

## 2019-05-08 DIAGNOSIS — Z6841 Body Mass Index (BMI) 40.0 and over, adult: Secondary | ICD-10-CM | POA: Diagnosis not present

## 2019-05-08 DIAGNOSIS — Z1331 Encounter for screening for depression: Secondary | ICD-10-CM | POA: Diagnosis not present

## 2019-05-08 DIAGNOSIS — N184 Chronic kidney disease, stage 4 (severe): Secondary | ICD-10-CM | POA: Diagnosis not present

## 2019-05-08 DIAGNOSIS — I10 Essential (primary) hypertension: Secondary | ICD-10-CM | POA: Diagnosis not present

## 2019-05-26 DIAGNOSIS — E782 Mixed hyperlipidemia: Secondary | ICD-10-CM | POA: Diagnosis not present

## 2019-05-26 DIAGNOSIS — I10 Essential (primary) hypertension: Secondary | ICD-10-CM | POA: Diagnosis not present

## 2019-05-26 DIAGNOSIS — E1165 Type 2 diabetes mellitus with hyperglycemia: Secondary | ICD-10-CM | POA: Diagnosis not present

## 2019-06-18 ENCOUNTER — Telehealth: Payer: Self-pay | Admitting: Cardiovascular Disease

## 2019-06-18 NOTE — Telephone Encounter (Signed)
Virtual Visit Pre-Appointment Phone Call  "(Name), I am calling you today to discuss your upcoming appointment. We are currently trying to limit exposure to the virus that causes COVID-19 by seeing patients at home rather than in the office."  1. "What is the BEST phone number to call the day of the visit?" - include this in appointment notes  2. Do you have or have access to (through a family member/friend) a smartphone with video capability that we can use for your visit?" a. If yes - list this number in appt notes as cell (if different from BEST phone #) and list the appointment type as a VIDEO visit in appointment notes b. If no - list the appointment type as a PHONE visit in appointment notes  3. Confirm consent - "In the setting of the current Covid19 crisis, you are scheduled for a (phone or video) visit with your provider on (date) at (time).  Just as we do with many in-office visits, in order for you to participate in this visit, we must obtain consent.  If you'd like, I can send this to your mychart (if signed up) or email for you to review.  Otherwise, I can obtain your verbal consent now.  All virtual visits are billed to your insurance company just like a normal visit would be.  By agreeing to a virtual visit, we'd like you to understand that the technology does not allow for your provider to perform an examination, and thus may limit your provider's ability to fully assess your condition. If your provider identifies any concerns that need to be evaluated in person, we will make arrangements to do so.  Finally, though the technology is pretty good, we cannot assure that it will always work on either your or our end, and in the setting of a video visit, we may have to convert it to a phone-only visit.  In either situation, we cannot ensure that we have a secure connection.  Are you willing to proceed?" STAFF: Did the patient verbally acknowledge consent to telehealth visit? Document  YES/NO here: yes  4. Advise patient to be prepared - "Two hours prior to your appointment, go ahead and check your blood pressure, pulse, oxygen saturation, and your weight (if you have the equipment to check those) and write them all down. When your visit starts, your provider will ask you for this information. If you have an Apple Watch or Kardia device, please plan to have heart rate information ready on the day of your appointment. Please have a pen and paper handy nearby the day of the visit as well."  5. Give patient instructions for MyChart download to smartphone OR Doximity/Doxy.me as below if video visit (depending on what platform provider is using)  6. Inform patient they will receive a phone call 15 minutes prior to their appointment time (may be from unknown caller ID) so they should be prepared to answer    [place] [facility] has been deemed a candidate for a follow-up tele-health visit to limit community exposure during the Covid-19 pandemic. I spoke with the patient via phone to ensure availability of phone/video source, confirm preferred email & phone number, and discuss instructions and expectations.  I reminded JAWANA REAGOR to be prepared with any vital sign and/or heart rhythm information that could potentially be obtained via home monitoring, at the time of her visit. I reminded LAMICA MCCART to expect a phone call prior to  her visit.  Weston Anna 06/18/2019 1:17 PM   INSTRUCTIONS FOR DOWNLOADING THE MYCHART APP TO SMARTPHONE  - The patient must first make sure to have activated MyChart and know their login information - If Apple, go to [employer] and type in MyChart in the search bar and download the app. If Android, ask patient to go to [employer] and type in [place] in the search bar and download the app. The app is free but as with any other app downloads, their phone may require them to verify saved payment information or  Apple/Android password.  - The patient will need to then log into the app with their MyChart username and password, and select [facility] as their healthcare provider to link the account. When it is time for your visit, go to the MyChart app, find appointments, and click Begin Video Visit. Be sure to Select Allow for your device to access the Microphone and Camera for your visit. You will then be connected, and your provider will be with you shortly.  **If they have any issues connecting, or need assistance please contact MyChart service desk [phone])**  **If using a computer, in order to ensure the best quality for their visit they will need to use either of the following Internet Browsers: [employer], or Google Chrome**  IF USING DOXIMITY or DOXY.ME - The patient will receive a link just prior to their visit by text.     FULL LENGTH CONSENT FOR TELE-HEALTH VISIT   I hereby voluntarily request, consent and authorize [facility] and its employed or contracted physicians, physician assistants, nurse practitioners or other licensed health care professionals (the Practitioner), to provide me with telemedicine health care services (the Services") as deemed necessary by the treating Practitioner. I acknowledge and consent to receive the Services by the Practitioner via telemedicine. I understand that the telemedicine visit will involve communicating with the Practitioner through live audiovisual communication technology and the disclosure of certain medical information by electronic transmission. I acknowledge that I have been given the opportunity to request an in-person assessment or other available alternative prior to the telemedicine visit and am voluntarily participating in the telemedicine visit.  I understand that I have the right to withhold or withdraw my consent to the use of telemedicine in the course of my care at any time, without affecting my right to future care  or treatment, and that the Practitioner or I may terminate the telemedicine visit at any time. I understand that I have the right to inspect all information obtained and/or recorded in the course of the telemedicine visit and may receive copies of available information for a reasonable fee.  I understand that some of the potential risks of receiving the Services via telemedicine include:   Delay or interruption in medical evaluation due to technological equipment failure or disruption;  Information transmitted may not be sufficient (e.g. poor resolution of images) to allow for appropriate medical decision making by the Practitioner; and/or   In rare instances, security protocols could fail, causing a breach of personal health information.  Furthermore, I acknowledge that it is my responsibility to provide information about my medical history, conditions and care that is complete and accurate to the best of my ability. I acknowledge that Practitioner's advice, recommendations, and/or decision may be based on factors not within their control, such as incomplete or inaccurate data provided by me or distortions of diagnostic images or specimens that may result from electronic transmissions. I  understand that the practice of medicine is not an exact science and that Practitioner makes no warranties or guarantees regarding treatment outcomes. I acknowledge that I will receive a copy of this consent concurrently upon execution via email to the email address I last provided but may also request a printed copy by calling the office of [facility].    I understand that my insurance will be billed for this visit.   I have read or had this consent read to me.  I understand the contents of this consent, which adequately explains the benefits and risks of the Services being provided via telemedicine.   I have been provided ample opportunity to ask questions regarding this consent and the Services and have had  my questions answered to my satisfaction.  I give my informed consent for the services to be provided through the use of telemedicine in my medical care  By participating in this telemedicine visit I agree to the above.

## 2019-06-24 ENCOUNTER — Ambulatory Visit (INDEPENDENT_AMBULATORY_CARE_PROVIDER_SITE_OTHER): Payer: Medicare HMO | Admitting: *Deleted

## 2019-06-24 ENCOUNTER — Other Ambulatory Visit: Payer: Self-pay

## 2019-06-24 ENCOUNTER — Encounter: Payer: Self-pay | Admitting: Cardiovascular Disease

## 2019-06-24 ENCOUNTER — Ambulatory Visit: Payer: Medicare HMO | Admitting: Cardiovascular Disease

## 2019-06-24 VITALS — BP 150/81 | HR 86 | Ht 63.0 in | Wt 238.0 lb

## 2019-06-24 DIAGNOSIS — I429 Cardiomyopathy, unspecified: Secondary | ICD-10-CM | POA: Diagnosis not present

## 2019-06-24 DIAGNOSIS — N184 Chronic kidney disease, stage 4 (severe): Secondary | ICD-10-CM | POA: Diagnosis not present

## 2019-06-24 DIAGNOSIS — Z950 Presence of cardiac pacemaker: Secondary | ICD-10-CM

## 2019-06-24 DIAGNOSIS — I442 Atrioventricular block, complete: Secondary | ICD-10-CM | POA: Diagnosis not present

## 2019-06-24 DIAGNOSIS — I10 Essential (primary) hypertension: Secondary | ICD-10-CM

## 2019-06-24 DIAGNOSIS — I5032 Chronic diastolic (congestive) heart failure: Secondary | ICD-10-CM

## 2019-06-24 DIAGNOSIS — Z23 Encounter for immunization: Secondary | ICD-10-CM

## 2019-06-24 DIAGNOSIS — R55 Syncope and collapse: Secondary | ICD-10-CM

## 2019-06-24 MED ORDER — HYDRALAZINE HCL 50 MG PO TABS: 50.0000 mg | ORAL_TABLET | Freq: Three times a day (TID) | ORAL | 0 refills | Status: DC

## 2019-06-24 MED ORDER — HYDRALAZINE HCL 50 MG PO TABS: 50.0000 mg | ORAL_TABLET | Freq: Three times a day (TID) | ORAL | 1 refills | Status: DC

## 2019-06-24 NOTE — Progress Notes (Signed)
SUBJECTIVE: The patient presents for follow-up of CHF.  She was evaluated in our office on 03/20/2019.  Past medical history also includes insulin-dependent diabetes mellitus, hypertension, hyperlipidemia, and breast cancer status post right mastectomy.  She also has a history of complete heart block.  Echocardiogram on 04/23/2019 demonstrated normalization of LV systolic function, EF 60 to 65%.  There was grade 1 diastolic dysfunction.  There was moderate left atrial dilatation and a trivial pericardial effusion.  She has a biventricular pacemaker.  After she took Lasix 40 mg daily for 5 days in late June, she has felt so much better and denies shortness of breath altogether.  She denies chest pain, orthopnea, and paroxysmal nocturnal dyspnea.  She is scheduled to have lab work done on 06/30/2019 and then see her nephrologist on 07/09/2019.  I reviewed her weight log.  She weighs daily.  She has had to take Lasix on a few occasions.    Review of Systems: As per "subjective", otherwise negative.  No Known Allergies  Current Outpatient Medications  Medication Sig Dispense Refill  . aspirin EC 81 MG tablet Take 81 mg by mouth daily.    . carvedilol (COREG) 6.25 MG tablet Take 0.5 tablets (3.125 mg total) by mouth 2 (two) times daily. 30 tablet 4  . chlorthalidone (HYGROTON) 25 MG tablet Take 25 mg by mouth daily.     . Cholecalciferol (VITAMIN D) 2000 UNITS tablet Take 2,000 Units by mouth daily.     . Cyanocobalamin (B-12) 3000 MCG CAPS Take 1 capsule by mouth daily.    . furosemide (LASIX) 40 MG tablet Take 1 tablet (40 mg total) by mouth daily. For 5 days, then daily as needed for weight gain of 3 lbs/24 hour or 5 lbs in one week 90 tablet 1  . glipiZIDE (GLUCOTROL XL) 5 MG 24 hr tablet Take 10 mg by mouth daily with breakfast.     . losartan (COZAAR) 100 MG tablet TAKE 1 TABLET EVERY DAY 90 tablet 3  . pioglitazone (ACTOS) 15 MG tablet Take 1 tablet by mouth daily.    .  potassium chloride (K-DUR) 10 MEQ tablet Take 1 tablet (10 mEq total) by mouth daily. With furosemide for 5 days, then daily as needed with furosemide 90 tablet 1  . pravastatin (PRAVACHOL) 10 MG tablet Take 10 mg by mouth at bedtime.      No current facility-administered medications for this visit.     Past Medical History:  Diagnosis Date  . Acute renal insufficiency 10/15/2013  . Arthritis   . Cancer [facility]) 2008   right breast  . Cancer of breast- s/p chemo and Rt mastectomy 2008 10/13/2013  . Cardiomyopathy ([place])    dating back to 2010  . CHB (complete heart block) ([place]) 10/13/2013  . Chronic combined systolic and diastolic congestive heart failure ([place])   . Diabetes mellitus    with neuropathy  . Diabetes mellitus type 2, controlled, without complications ([place]) 9/98/3382  . Dyslipidemia 10/13/2013  . History of blood transfusion   . Hypercholesterolemia   . Hypertension   . Mitral stenosis- moderate with trivial MR  10/15/2013  . Pacemaker 10/23/2014  . Presence of permanent cardiac pacemaker   . Primary osteoarthritis of left knee 11/04/2015  . Syncope 10/13/2013  . Tingling    feet bilat     Past Surgical History:  Procedure Laterality Date  . APPENDECTOMY     mmh  . BI-VENTRICULAR PACEMAKER INSERTION N/A 10/15/2013  Procedure: BI-VENTRICULAR PACEMAKER INSERTION (CRT-P);  Surgeon: Evans Lance, MD;  Location: [facility] CATH LAB;  Service: Cardiovascular;  Laterality: N/A;  . Cardiomyopathy    . CATARACT EXTRACTION W/PHACO  06/29/2011   Procedure: CATARACT EXTRACTION PHACO AND INTRAOCULAR LENS PLACEMENT (IOC);  Surgeon: Tonny Branch;  Location: [facility] ORS;  Service: Ophthalmology;  Laterality: Right;  CDE: 14.41  . CATARACT EXTRACTION W/PHACO  09/04/2011   Procedure: CATARACT EXTRACTION PHACO AND INTRAOCULAR LENS PLACEMENT (IOC);  Surgeon: Tonny Branch;  Location: [facility] ORS;  Service: Ophthalmology;  Laterality: Left;  CDE: 15.21  . CHOLECYSTECTOMY     mmh  . KNEE ARTHROSCOPY     right-mmh   . MASTECTOMY  2008    mmh-right  . TEMPORARY PACEMAKER INSERTION N/A 10/13/2013   Procedure: TEMPORARY PACEMAKER INSERTION;  Surgeon: Jolaine Artist, MD;  [facility] CATH LAB;  Service: Cardiovascular;  Laterality: N/A;  . TOTAL KNEE ARTHROPLASTY Left 11/04/2015   Procedure: LEFT TOTAL KNEE ARTHROPLASTY;  Surgeon: Rod Can, MD;  Location: [facility] ORS;  Service: Orthopedics;  Laterality: Left;    Social History   Socioeconomic History  . Marital status: Widowed    Spouse name: Not on file  . Number of children: Not on file  . Years of education: Not on file  . Highest education level: Not on file  Occupational History  . Not on file  Social Needs  . Financial resource strain: Not on file  . Food insecurity    Worry: Not on file    Inability: Not on file  . Transportation needs    Medical: Not on file    Non-medical: Not on file  Tobacco Use  . Smoking status: Never Smoker  . Smokeless tobacco: Never Used  Substance and Sexual Activity  . Alcohol use: No    Alcohol/week: 0.0 standard drinks  . Drug use: No  . Sexual activity: Yes    Birth control/protection: Post-menopausal  Lifestyle  . Physical activity    Days per week: Not on file    Minutes per session: Not on file  . Stress: Not on file  Relationships  . Social Herbalist on phone: Not on file    Gets together: Not on file    Attends religious service: Not on file    Active member of club or organization: Not on file    Attends meetings of clubs or organizations: Not on file    Relationship status: Not on file  . Intimate partner violence    Fear of current or ex partner: Not on file    Emotionally abused: Not on file    Physically abused: Not on file    Forced sexual activity: Not on file  Other Topics Concern  . Not on file  Social History Narrative  . Not on file     Vitals:   06/24/19 1044  BP: (!) 150/81  Pulse: 86  SpO2: 96%  Weight: 238 lb (108 kg)  Height: 5\' 3"  (1.6 m)     Wt Readings from Last 3 Encounters:  06/24/19 238 lb (108 kg)  03/20/19 238 lb (108 kg)  08/01/18 232 lb (105.2 kg)     PHYSICAL EXAM General: NAD HEENT: Normal. Neck: No JVD, no thyromegaly. Lungs: Clear to auscultation bilaterally with normal respiratory effort. CV: Regular rate and rhythm, normal S1/S2, no S3/S4, no murmur. No pretibial or periankle edema.    Abdomen: Soft, nontender, no distention.  Neurologic: Alert and oriented.  Psych:  Normal affect. Skin: Normal. Musculoskeletal: No gross deformities.      Labs: Lab Results  Component Value Date/Time   K 3.8 12/14/2015 09:59 PM   BUN 34 (H) 12/14/2015 09:59 PM   CREATININE 2.07 (H) 12/14/2015 09:59 PM   ALT 12 (L) 12/14/2015 09:59 PM   TSH 1.033 10/13/2013 08:00 PM   HGB 9.9 (L) 12/14/2015 09:59 PM     Lipids: No results found for: LDLCALC, LDLDIRECT, CHOL, TRIG, HDL     ASSESSMENT AND PLAN: 1.  Chronic diastolic heart failure: She has grade 1 diastolic dysfunction by most recent echocardiogram.  Weight stable since June.  Appears euvolemic.  Blood pressure is elevated.  BUN 66 and creatinine 2.35 on 05/06/2019.  I will stop losartan and chlorthalidone.  I will start hydralazine 50 mg 3 times daily.  She is scheduled to have follow-up blood work by her nephrologist on 06/30/2019.  2.  Hypertension: Blood pressure is elevated.  Given creatinine of 2.35 on 05/06/2019, I will stop both chlorthalidone and losartan.  I will start hydralazine 50 mg 3 times daily.  3.  Status post biventricular pacemaker for complete heart block: Normal device function.   4.  Chronic kidney disease stage IV: She has follow-up blood work on 06/30/2019 and follow-up with nephrology on 07/09/2019.  Creatinine 2.35 on 05/06/19.  I will stop chlorthalidone and losartan.   Disposition: Follow up 4 months   Kate Sable, M.D., F.A.C.C.

## 2019-06-24 NOTE — Patient Instructions (Signed)
Your physician recommends that you schedule a follow-up appointment in: [place] physician has recommended you make the following change in your medication:   STOP LOSARTAN  STOP CHLORTHALIDONE  START HYDRALAZINE 50 MG THREE TIMES DAILY   Thank you for choosing [facility]!!

## 2019-06-26 LAB — CUP PACEART REMOTE DEVICE CHECK
Battery Remaining Longevity: 41 mo
Battery Remaining Percentage: 50 %
Battery Voltage: 2.87 V
Brady Statistic AP VP Percent: 1 %
Brady Statistic AP VS Percent: 1 %
Brady Statistic AS VP Percent: 99 %
Brady Statistic AS VS Percent: 1 %
Brady Statistic RA Percent Paced: 1 %
Date Time Interrogation Session: 20200929024037
Implantable Lead Implant Date: 20150121
Implantable Lead Implant Date: 20150121
Implantable Lead Implant Date: 20150121
Implantable Lead Location: 753858
Implantable Lead Location: 753859
Implantable Lead Location: 753860
Implantable Pulse Generator Implant Date: 20150121
Lead Channel Impedance Value: 380 Ohm
Lead Channel Impedance Value: 430 Ohm
Lead Channel Impedance Value: 550 Ohm
Lead Channel Pacing Threshold Amplitude: 0.75 V
Lead Channel Pacing Threshold Amplitude: 0.75 V
Lead Channel Pacing Threshold Amplitude: 1.25 V
Lead Channel Pacing Threshold Pulse Width: 0.5 ms
Lead Channel Pacing Threshold Pulse Width: 0.5 ms
Lead Channel Pacing Threshold Pulse Width: 0.5 ms
Lead Channel Sensing Intrinsic Amplitude: 12 mV
Lead Channel Sensing Intrinsic Amplitude: 4.4 mV
Lead Channel Setting Pacing Amplitude: 2 V
Lead Channel Setting Pacing Amplitude: 2.25 V
Lead Channel Setting Pacing Amplitude: 2.5 V
Lead Channel Setting Pacing Pulse Width: 0.5 ms
Lead Channel Setting Pacing Pulse Width: 0.5 ms
Lead Channel Setting Sensing Sensitivity: 2 mV
Pulse Gen Model: 3242
Pulse Gen Serial Number: 7557114

## 2019-07-02 DIAGNOSIS — I129 Hypertensive chronic kidney disease with stage 1 through stage 4 chronic kidney disease, or unspecified chronic kidney disease: Secondary | ICD-10-CM | POA: Diagnosis not present

## 2019-07-02 DIAGNOSIS — D649 Anemia, unspecified: Secondary | ICD-10-CM | POA: Diagnosis not present

## 2019-07-02 DIAGNOSIS — Z79899 Other long term (current) drug therapy: Secondary | ICD-10-CM | POA: Diagnosis not present

## 2019-07-02 DIAGNOSIS — R809 Proteinuria, unspecified: Secondary | ICD-10-CM | POA: Diagnosis not present

## 2019-07-02 DIAGNOSIS — N183 Chronic kidney disease, stage 3 unspecified: Secondary | ICD-10-CM | POA: Diagnosis not present

## 2019-07-02 DIAGNOSIS — E559 Vitamin D deficiency, unspecified: Secondary | ICD-10-CM | POA: Diagnosis not present

## 2019-07-03 NOTE — Progress Notes (Signed)
Remote pacemaker transmission.   

## 2019-07-09 DIAGNOSIS — N189 Chronic kidney disease, unspecified: Secondary | ICD-10-CM | POA: Diagnosis not present

## 2019-07-09 DIAGNOSIS — E559 Vitamin D deficiency, unspecified: Secondary | ICD-10-CM | POA: Diagnosis not present

## 2019-07-09 DIAGNOSIS — E1122 Type 2 diabetes mellitus with diabetic chronic kidney disease: Secondary | ICD-10-CM | POA: Diagnosis not present

## 2019-07-09 DIAGNOSIS — R809 Proteinuria, unspecified: Secondary | ICD-10-CM | POA: Diagnosis not present

## 2019-07-09 DIAGNOSIS — D631 Anemia in chronic kidney disease: Secondary | ICD-10-CM | POA: Diagnosis not present

## 2019-07-09 DIAGNOSIS — I129 Hypertensive chronic kidney disease with stage 1 through stage 4 chronic kidney disease, or unspecified chronic kidney disease: Secondary | ICD-10-CM | POA: Diagnosis not present

## 2019-07-09 DIAGNOSIS — E1129 Type 2 diabetes mellitus with other diabetic kidney complication: Secondary | ICD-10-CM | POA: Diagnosis not present

## 2019-07-09 DIAGNOSIS — N393 Stress incontinence (female) (male): Secondary | ICD-10-CM | POA: Diagnosis not present

## 2019-07-15 DIAGNOSIS — Z79899 Other long term (current) drug therapy: Secondary | ICD-10-CM | POA: Diagnosis not present

## 2019-07-15 DIAGNOSIS — N183 Chronic kidney disease, stage 3 unspecified: Secondary | ICD-10-CM | POA: Diagnosis not present

## 2019-07-17 ENCOUNTER — Other Ambulatory Visit: Payer: Self-pay | Admitting: Cardiovascular Disease

## 2019-07-24 ENCOUNTER — Other Ambulatory Visit: Payer: Self-pay | Admitting: Physician Assistant

## 2019-07-24 DIAGNOSIS — I129 Hypertensive chronic kidney disease with stage 1 through stage 4 chronic kidney disease, or unspecified chronic kidney disease: Secondary | ICD-10-CM | POA: Diagnosis not present

## 2019-07-24 DIAGNOSIS — E1129 Type 2 diabetes mellitus with other diabetic kidney complication: Secondary | ICD-10-CM | POA: Diagnosis not present

## 2019-07-24 DIAGNOSIS — R809 Proteinuria, unspecified: Secondary | ICD-10-CM | POA: Diagnosis not present

## 2019-07-24 DIAGNOSIS — E1122 Type 2 diabetes mellitus with diabetic chronic kidney disease: Secondary | ICD-10-CM | POA: Diagnosis not present

## 2019-07-24 DIAGNOSIS — I5032 Chronic diastolic (congestive) heart failure: Secondary | ICD-10-CM | POA: Diagnosis not present

## 2019-07-24 DIAGNOSIS — E559 Vitamin D deficiency, unspecified: Secondary | ICD-10-CM | POA: Diagnosis not present

## 2019-07-24 DIAGNOSIS — R062 Wheezing: Secondary | ICD-10-CM | POA: Diagnosis not present

## 2019-07-24 DIAGNOSIS — N189 Chronic kidney disease, unspecified: Secondary | ICD-10-CM | POA: Diagnosis not present

## 2019-07-24 DIAGNOSIS — D631 Anemia in chronic kidney disease: Secondary | ICD-10-CM | POA: Diagnosis not present

## 2019-07-24 NOTE — Telephone Encounter (Signed)
This is a [place] pt °

## 2019-07-25 DIAGNOSIS — I10 Essential (primary) hypertension: Secondary | ICD-10-CM | POA: Diagnosis not present

## 2019-07-25 DIAGNOSIS — E782 Mixed hyperlipidemia: Secondary | ICD-10-CM | POA: Diagnosis not present

## 2019-07-25 DIAGNOSIS — R0602 Shortness of breath: Secondary | ICD-10-CM | POA: Diagnosis not present

## 2019-07-25 DIAGNOSIS — R062 Wheezing: Secondary | ICD-10-CM | POA: Diagnosis not present

## 2019-07-25 DIAGNOSIS — Z6841 Body Mass Index (BMI) 40.0 and over, adult: Secondary | ICD-10-CM | POA: Diagnosis not present

## 2019-07-26 ENCOUNTER — Emergency Department (HOSPITAL_COMMUNITY): Payer: Medicare HMO

## 2019-07-26 ENCOUNTER — Emergency Department (HOSPITAL_COMMUNITY)
Admission: EM | Admit: 2019-07-26 | Discharge: 2019-07-26 | Disposition: A | Discharge status: Home / Self Care | Payer: Medicare HMO | Attending: Emergency Medicine | Admitting: Emergency Medicine

## 2019-07-26 ENCOUNTER — Other Ambulatory Visit: Payer: Self-pay

## 2019-07-26 ENCOUNTER — Encounter (HOSPITAL_COMMUNITY): Payer: Self-pay

## 2019-07-26 DIAGNOSIS — E162 Hypoglycemia, unspecified: Secondary | ICD-10-CM | POA: Diagnosis not present

## 2019-07-26 DIAGNOSIS — Z7982 Long term (current) use of aspirin: Secondary | ICD-10-CM | POA: Diagnosis not present

## 2019-07-26 DIAGNOSIS — J189 Pneumonia, unspecified organism: Secondary | ICD-10-CM | POA: Diagnosis not present

## 2019-07-26 DIAGNOSIS — Z96652 Presence of left artificial knee joint: Secondary | ICD-10-CM | POA: Insufficient documentation

## 2019-07-26 DIAGNOSIS — E11649 Type 2 diabetes mellitus with hypoglycemia without coma: Secondary | ICD-10-CM | POA: Insufficient documentation

## 2019-07-26 DIAGNOSIS — R4781 Slurred speech: Secondary | ICD-10-CM | POA: Diagnosis not present

## 2019-07-26 DIAGNOSIS — Z950 Presence of cardiac pacemaker: Secondary | ICD-10-CM | POA: Diagnosis not present

## 2019-07-26 DIAGNOSIS — I110 Hypertensive heart disease with heart failure: Secondary | ICD-10-CM | POA: Diagnosis not present

## 2019-07-26 DIAGNOSIS — R05 Cough: Secondary | ICD-10-CM | POA: Diagnosis not present

## 2019-07-26 DIAGNOSIS — I5042 Chronic combined systolic (congestive) and diastolic (congestive) heart failure: Secondary | ICD-10-CM | POA: Insufficient documentation

## 2019-07-26 DIAGNOSIS — E161 Other hypoglycemia: Secondary | ICD-10-CM | POA: Diagnosis not present

## 2019-07-26 DIAGNOSIS — Z79899 Other long term (current) drug therapy: Secondary | ICD-10-CM | POA: Diagnosis not present

## 2019-07-26 DIAGNOSIS — R0902 Hypoxemia: Secondary | ICD-10-CM | POA: Diagnosis not present

## 2019-07-26 DIAGNOSIS — Z7984 Long term (current) use of oral hypoglycemic drugs: Secondary | ICD-10-CM | POA: Diagnosis not present

## 2019-07-26 LAB — CBC WITH DIFFERENTIAL/PLATELET
Abs Immature Granulocytes: 0.04 10*3/uL (ref 0.00–0.07)
Basophils Absolute: 0 10*3/uL (ref 0.0–0.1)
Basophils Relative: 0 %
Eosinophils Absolute: 0 10*3/uL (ref 0.0–0.5)
Eosinophils Relative: 0 %
HCT: 34.7 % — ABNORMAL LOW (ref 36.0–46.0)
Hemoglobin: 10.3 g/dL — ABNORMAL LOW (ref 12.0–15.0)
Immature Granulocytes: 1 %
Lymphocytes Relative: 15 %
Lymphs Abs: 1.3 10*3/uL (ref 0.7–4.0)
MCH: 28.8 pg (ref 26.0–34.0)
MCHC: 29.7 g/dL — ABNORMAL LOW (ref 30.0–36.0)
MCV: 96.9 fL (ref 80.0–100.0)
Monocytes Absolute: 0.8 10*3/uL (ref 0.1–1.0)
Monocytes Relative: 10 %
Neutro Abs: 6.2 10*3/uL (ref 1.7–7.7)
Neutrophils Relative %: 74 %
Platelets: 218 10*3/uL (ref 150–400)
RBC: 3.58 MIL/uL — ABNORMAL LOW (ref 3.87–5.11)
RDW: 15.5 % (ref 11.5–15.5)
WBC: 8.4 10*3/uL (ref 4.0–10.5)
nRBC: 0 % (ref 0.0–0.2)

## 2019-07-26 LAB — BASIC METABOLIC PANEL
Anion gap: 7 (ref 5–15)
BUN: 59 mg/dL — ABNORMAL HIGH (ref 8–23)
CO2: 21 mmol/L — ABNORMAL LOW (ref 22–32)
Calcium: 8.1 mg/dL — ABNORMAL LOW (ref 8.9–10.3)
Chloride: 111 mmol/L (ref 98–111)
Creatinine, Ser: 2.64 mg/dL — ABNORMAL HIGH (ref 0.44–1.00)
GFR calc Af Amer: 20 mL/min — ABNORMAL LOW (ref >60)
GFR calc non Af Amer: 17 mL/min — ABNORMAL LOW (ref >60)
Glucose, Bld: 148 mg/dL — ABNORMAL HIGH (ref 70–99)
Potassium: 4.3 mmol/L (ref 3.5–5.1)
Sodium: 139 mmol/L (ref 135–145)

## 2019-07-26 LAB — CBG MONITORING, ED
Glucose-Capillary: 120 mg/dL — ABNORMAL HIGH (ref 70–99)
Glucose-Capillary: 167 mg/dL — ABNORMAL HIGH (ref 70–99)

## 2019-07-26 MED ORDER — SODIUM CHLORIDE 0.9 % IV BOLUS: 500.0000 mL | Freq: Once | INTRAVENOUS | Status: DC

## 2019-07-26 NOTE — ED Provider Notes (Addendum)
[facility] EMERGENCY DEPARTMENT Provider Note   CSN: [id number] Arrival date & time: 07/26/19  1056     History   Chief Complaint Chief Complaint  Patient presents with  . Hypoglycemia    HPI Cassandra Parker is a 77 y.o. female.     Level 5 caveat for hypoglycemia.  Patient awoke with slurred speech this morning.  Glucose 40 per EMS.  Supplemental glucose was given which improved her reading to 120.  Diagnosis of "pneumonia" yesterday and prescribed an antibiotic.  No obvious coughing today.  She is feeling better now.     Past Medical History:  Diagnosis Date  . Acute renal insufficiency 10/15/2013  . Arthritis   . Cancer [facility]) 2008   right breast  . Cancer of breast- s/p chemo and Rt mastectomy 2008 10/13/2013  . Cardiomyopathy ([place])    dating back to 2010  . CHB (complete heart block) ([place]) 10/13/2013  . Chronic combined systolic and diastolic congestive heart failure ([place])   . Diabetes mellitus    with neuropathy  . Diabetes mellitus type 2, controlled, without complications ([place]) 01/02/7352  . Dyslipidemia 10/13/2013  . History of blood transfusion   . Hypercholesterolemia   . Hypertension   . Mitral stenosis- moderate with trivial MR  10/15/2013  . Pacemaker 10/23/2014  . Presence of permanent cardiac pacemaker   . Primary osteoarthritis of left knee 11/04/2015  . Syncope 10/13/2013  . Tingling    feet bilat     Patient Active Problem List   Diagnosis Date Noted  . Primary osteoarthritis of left knee 11/04/2015  . Pacemaker 10/23/2014  . Acute renal insufficiency 10/15/2013  . Mitral stenosis- moderate with trivial MR  10/15/2013  . CHB (complete heart block) ([place]) 10/13/2013  . Syncope 10/13/2013  . HTN (hypertension) 10/13/2013  . Diabetes mellitus type 2, controlled, without complications ([place]) 29/92/4268  . Cancer of breast- s/p chemo and Rt mastectomy 2008 10/13/2013  . Cardiomyopathy- EF 35-40% (etiology not yet determined) 10/13/2013  .  Dyslipidemia 10/13/2013    Past Surgical History:  Procedure Laterality Date  . APPENDECTOMY     mmh  . BI-VENTRICULAR PACEMAKER INSERTION N/A 10/15/2013   Procedure: BI-VENTRICULAR PACEMAKER INSERTION (CRT-P);  Surgeon: Evans Lance, MD;  Location: [facility] CATH LAB;  Service: Cardiovascular;  Laterality: N/A;  . Cardiomyopathy    . CATARACT EXTRACTION W/PHACO  06/29/2011   Procedure: CATARACT EXTRACTION PHACO AND INTRAOCULAR LENS PLACEMENT (IOC);  Surgeon: Tonny Branch;  Location: [facility] ORS;  Service: Ophthalmology;  Laterality: Right;  CDE: 14.41  . CATARACT EXTRACTION W/PHACO  09/04/2011   Procedure: CATARACT EXTRACTION PHACO AND INTRAOCULAR LENS PLACEMENT (IOC);  Surgeon: Tonny Branch;  Location: [facility] ORS;  Service: Ophthalmology;  Laterality: Left;  CDE: 15.21  . CHOLECYSTECTOMY     mmh  . KNEE ARTHROSCOPY     right-mmh  . MASTECTOMY  2008    mmh-right  . TEMPORARY PACEMAKER INSERTION N/A 10/13/2013   Procedure: TEMPORARY PACEMAKER INSERTION;  Surgeon: Jolaine Artist, MD;  Location: [facility] CATH LAB;  Service: Cardiovascular;  Laterality: N/A;  . TOTAL KNEE ARTHROPLASTY Left 11/04/2015   Procedure: LEFT TOTAL KNEE ARTHROPLASTY;  Surgeon: Rod Can, MD;  Location: [facility] ORS;  Service: Orthopedics;  Laterality: Left;     OB History    Gravida  3   Para  3   Term  3   Preterm      AB      Living  SAB      TAB      Ectopic      Multiple      Live Births               Home Medications    Prior to Admission medications   Medication Sig Start Date End Date Taking? Authorizing Provider  aspirin EC 81 MG tablet Take 81 mg by mouth daily.    [provider]  carvedilol (COREG) 6.25 MG tablet Take 0.5 tablets (3.125 mg total) by mouth 2 (two) times daily. 10/16/13   Edmisten, Azzie Roup, PA-C  Cholecalciferol (VITAMIN D) 2000 UNITS tablet Take 2,000 Units by mouth daily.     [provider]  Cyanocobalamin (B-12) 3000 MCG CAPS Take 1 capsule by mouth  daily.    [provider]  furosemide (LASIX) 40 MG tablet TAKE 1 TABLET EVERY DAY FOR 5 DAYS, THEN DAILY AS NEEDED FOR WEIGHT GAIN OF 3 POUNDS IN 24 HOURS, OR 5 POUNDS IN 1 WEEK 07/24/19   Herminio Commons, MD  glipiZIDE (GLUCOTROL XL) 5 MG 24 hr tablet Take 10 mg by mouth daily with breakfast.     [provider]  hydrALAZINE (APRESOLINE) 50 MG tablet TAKE 1 TABLET BY MOUTH THREE TIMES A DAY 07/17/19   Herminio Commons, MD  pioglitazone (ACTOS) 15 MG tablet Take 1 tablet by mouth daily. 01/10/18   [provider]  potassium chloride (K-DUR) 10 MEQ tablet Take 1 tablet (10 mEq total) by mouth daily. With furosemide for 5 days, then daily as needed with furosemide 03/21/19 06/24/19  Barrett, Evelene Croon, PA-C  pravastatin (PRAVACHOL) 10 MG tablet Take 10 mg by mouth at bedtime.     [provider]    Family History Family History  Problem Relation Age of Onset  . Hypertension Father   . Diabetes Father   . Anesthesia problems Neg Hx   . Hypotension Neg Hx   . Malignant hyperthermia Neg Hx   . Pseudochol deficiency Neg Hx     Social History Social History   Tobacco Use  . Smoking status: Never Smoker  . Smokeless tobacco: Never Used  Substance Use Topics  . Alcohol use: No    Alcohol/week: 0.0 standard drinks  . Drug use: No     Allergies   Patient has no known allergies.   Review of Systems Review of Systems  Unable to perform ROS: Acuity of condition     Physical Exam Updated Vital Signs BP (!) 157/63 (BP Location: Left Arm)   Pulse 65   Temp 99.4 F (37.4 C) (Oral)   Resp 18   Ht 5\' 3"  (1.6 m)   Wt 105.7 kg   SpO2 98%   BMI 41.27 kg/m   Physical Exam Vitals signs and nursing note reviewed.  Constitutional:      Appearance: She is well-developed.     Comments: nad  HENT:     Head: Normocephalic and atraumatic.  Eyes:     Conjunctiva/sclera: Conjunctivae normal.  Neck:     Musculoskeletal: Neck supple.   Cardiovascular:     Rate and Rhythm: Normal rate and regular rhythm.  Pulmonary:     Effort: Pulmonary effort is normal.     Breath sounds: Normal breath sounds.  Abdominal:     General: Bowel sounds are normal.     Palpations: Abdomen is soft.  Musculoskeletal: Normal range of motion.  Skin:    General: Skin is warm and  dry.  Neurological:     General: No focal deficit present.     Mental Status: She is alert and oriented to person, place, and time.  Psychiatric:        Behavior: Behavior normal.      ED Treatments / Results  Labs (all labs ordered are listed, but only abnormal results are displayed) Labs Reviewed  CBC WITH DIFFERENTIAL/PLATELET - Abnormal; Notable for the following components:      Result Value   RBC 3.58 (*)    Hemoglobin 10.3 (*)    HCT 34.7 (*)    MCHC 29.7 (*)    All other components within normal limits  BASIC METABOLIC PANEL - Abnormal; Notable for the following components:   CO2 21 (*)    Glucose, Bld 148 (*)    BUN 59 (*)    Creatinine, Ser 2.64 (*)    Calcium 8.1 (*)    GFR calc non Af Amer 17 (*)    GFR calc Af Amer 20 (*)    All other components within normal limits  CBG MONITORING, ED - Abnormal; Notable for the following components:   Glucose-Capillary 120 (*)    All other components within normal limits  CBG MONITORING, ED - Abnormal; Notable for the following components:   Glucose-Capillary 167 (*)    All other components within normal limits    EKG None  Radiology Dg Chest Port 1 View  Result Date: 07/26/2019 CLINICAL DATA:  Cough. EXAM: PORTABLE CHEST 1 VIEW COMPARISON:  July 25, 2019 FINDINGS: Patient has a 3 lead pacemaker. No pneumothorax. Cardiomegaly. The hila and mediastinum are normal. No overt pulmonary edema, nodule, mass, or focal infiltrate. IMPRESSION: No active disease. Electronically Signed   By: Dorise Bullion III M.D   On: 07/26/2019 13:27    Procedures Procedures (including critical care time)   Medications Ordered in ED Medications  sodium chloride 0.9 % bolus 500 mL (has no administration in time range)     Initial Impression / Assessment and Plan / ED Course  I have reviewed the triage vital signs and the nursing notes.  Pertinent labs & imaging results that were available during my care of the patient were reviewed by me and considered in my medical decision making (see chart for details).        Patient is hemodynamically stable.  Will obtain chest x-ray and basic labs.  1400: Glucose has improved.  Patient is alert.  Stable for outpatient management. Final Clinical Impressions(s) / ED Diagnoses   Final diagnoses:  Hypoglycemia    ED Discharge Orders    None       Nat Christen, MD 07/26/19 1259    Nat Christen, MD 07/26/19 1400

## 2019-07-26 NOTE — ED Triage Notes (Addendum)
Pt reports waking up with her speech slurred and EMS was called Blood sugar was 40. Sugar packet was given and sugar came up to 58 and words stopped slurring. Sugar at this time 120. Pt reports that she was told she had pneumonia dx yesterday and was given an antibiotic

## 2019-07-26 NOTE — Discharge Instructions (Signed)
Glucose is improved.  Check your sugars carefully.  Would hold any diabetes medicine today.

## 2019-08-07 ENCOUNTER — Other Ambulatory Visit: Payer: Self-pay | Admitting: Physician Assistant

## 2019-08-26 ENCOUNTER — Ambulatory Visit: Payer: Medicare HMO | Admitting: Urology

## 2019-08-27 DIAGNOSIS — E1122 Type 2 diabetes mellitus with diabetic chronic kidney disease: Secondary | ICD-10-CM | POA: Diagnosis not present

## 2019-08-27 DIAGNOSIS — D631 Anemia in chronic kidney disease: Secondary | ICD-10-CM | POA: Diagnosis not present

## 2019-08-27 DIAGNOSIS — Z9089 Acquired absence of other organs: Secondary | ICD-10-CM | POA: Diagnosis not present

## 2019-08-27 DIAGNOSIS — D509 Iron deficiency anemia, unspecified: Secondary | ICD-10-CM | POA: Diagnosis not present

## 2019-08-27 DIAGNOSIS — N184 Chronic kidney disease, stage 4 (severe): Secondary | ICD-10-CM | POA: Diagnosis not present

## 2019-08-27 DIAGNOSIS — E785 Hyperlipidemia, unspecified: Secondary | ICD-10-CM | POA: Diagnosis not present

## 2019-08-27 DIAGNOSIS — Z9049 Acquired absence of other specified parts of digestive tract: Secondary | ICD-10-CM | POA: Diagnosis not present

## 2019-08-27 DIAGNOSIS — E559 Vitamin D deficiency, unspecified: Secondary | ICD-10-CM | POA: Diagnosis not present

## 2019-08-27 DIAGNOSIS — I129 Hypertensive chronic kidney disease with stage 1 through stage 4 chronic kidney disease, or unspecified chronic kidney disease: Secondary | ICD-10-CM | POA: Diagnosis not present

## 2019-08-27 DIAGNOSIS — Z9011 Acquired absence of right breast and nipple: Secondary | ICD-10-CM | POA: Diagnosis not present

## 2019-08-27 DIAGNOSIS — Z96652 Presence of left artificial knee joint: Secondary | ICD-10-CM | POA: Diagnosis not present

## 2019-08-27 DIAGNOSIS — Z950 Presence of cardiac pacemaker: Secondary | ICD-10-CM | POA: Diagnosis not present

## 2019-08-27 DIAGNOSIS — R609 Edema, unspecified: Secondary | ICD-10-CM | POA: Diagnosis not present

## 2019-09-04 DIAGNOSIS — I10 Essential (primary) hypertension: Secondary | ICD-10-CM | POA: Diagnosis not present

## 2019-09-04 DIAGNOSIS — E7800 Pure hypercholesterolemia, unspecified: Secondary | ICD-10-CM | POA: Diagnosis not present

## 2019-09-04 DIAGNOSIS — E1165 Type 2 diabetes mellitus with hyperglycemia: Secondary | ICD-10-CM | POA: Diagnosis not present

## 2019-09-04 DIAGNOSIS — N184 Chronic kidney disease, stage 4 (severe): Secondary | ICD-10-CM | POA: Diagnosis not present

## 2019-09-08 DIAGNOSIS — E1165 Type 2 diabetes mellitus with hyperglycemia: Secondary | ICD-10-CM | POA: Diagnosis not present

## 2019-09-08 DIAGNOSIS — I10 Essential (primary) hypertension: Secondary | ICD-10-CM | POA: Diagnosis not present

## 2019-09-08 DIAGNOSIS — N184 Chronic kidney disease, stage 4 (severe): Secondary | ICD-10-CM | POA: Diagnosis not present

## 2019-09-08 DIAGNOSIS — Z6841 Body Mass Index (BMI) 40.0 and over, adult: Secondary | ICD-10-CM | POA: Diagnosis not present

## 2019-09-08 DIAGNOSIS — J189 Pneumonia, unspecified organism: Secondary | ICD-10-CM | POA: Diagnosis not present

## 2019-09-08 DIAGNOSIS — E1121 Type 2 diabetes mellitus with diabetic nephropathy: Secondary | ICD-10-CM | POA: Diagnosis not present

## 2019-09-08 DIAGNOSIS — I5033 Acute on chronic diastolic (congestive) heart failure: Secondary | ICD-10-CM | POA: Diagnosis not present

## 2019-09-08 DIAGNOSIS — R0602 Shortness of breath: Secondary | ICD-10-CM | POA: Diagnosis not present

## 2019-09-23 ENCOUNTER — Ambulatory Visit (INDEPENDENT_AMBULATORY_CARE_PROVIDER_SITE_OTHER): Payer: Medicare HMO | Admitting: *Deleted

## 2019-09-23 DIAGNOSIS — I442 Atrioventricular block, complete: Secondary | ICD-10-CM

## 2019-09-23 LAB — CUP PACEART REMOTE DEVICE CHECK
Battery Remaining Longevity: 40 mo
Battery Remaining Percentage: 50 %
Battery Voltage: 2.87 V
Brady Statistic AP VP Percent: 1.3 %
Brady Statistic AP VS Percent: 1 %
Brady Statistic AS VP Percent: 99 %
Brady Statistic AS VS Percent: 1 %
Brady Statistic RA Percent Paced: 1.2 %
Date Time Interrogation Session: 20201229020019
Implantable Lead Implant Date: 20150121
Implantable Lead Implant Date: 20150121
Implantable Lead Implant Date: 20150121
Implantable Lead Location: 753858
Implantable Lead Location: 753859
Implantable Lead Location: 753860
Implantable Pulse Generator Implant Date: 20150121
Lead Channel Impedance Value: 360 Ohm
Lead Channel Impedance Value: 390 Ohm
Lead Channel Impedance Value: 480 Ohm
Lead Channel Pacing Threshold Amplitude: 0.75 V
Lead Channel Pacing Threshold Amplitude: 0.75 V
Lead Channel Pacing Threshold Amplitude: 1.25 V
Lead Channel Pacing Threshold Pulse Width: 0.5 ms
Lead Channel Pacing Threshold Pulse Width: 0.5 ms
Lead Channel Pacing Threshold Pulse Width: 0.5 ms
Lead Channel Sensing Intrinsic Amplitude: 12 mV
Lead Channel Sensing Intrinsic Amplitude: 3.8 mV
Lead Channel Setting Pacing Amplitude: 2 V
Lead Channel Setting Pacing Amplitude: 2.25 V
Lead Channel Setting Pacing Amplitude: 2.5 V
Lead Channel Setting Pacing Pulse Width: 0.5 ms
Lead Channel Setting Pacing Pulse Width: 0.5 ms
Lead Channel Setting Sensing Sensitivity: 2 mV
Pulse Gen Model: 3242
Pulse Gen Serial Number: 7557114

## 2019-09-25 DIAGNOSIS — E7800 Pure hypercholesterolemia, unspecified: Secondary | ICD-10-CM | POA: Diagnosis not present

## 2019-09-25 DIAGNOSIS — I10 Essential (primary) hypertension: Secondary | ICD-10-CM | POA: Diagnosis not present

## 2019-10-20 DIAGNOSIS — Z1152 Encounter for screening for COVID-19: Secondary | ICD-10-CM | POA: Diagnosis not present

## 2019-10-24 DIAGNOSIS — E7849 Other hyperlipidemia: Secondary | ICD-10-CM | POA: Diagnosis not present

## 2019-10-24 DIAGNOSIS — I10 Essential (primary) hypertension: Secondary | ICD-10-CM | POA: Diagnosis not present

## 2019-10-24 DIAGNOSIS — E1165 Type 2 diabetes mellitus with hyperglycemia: Secondary | ICD-10-CM | POA: Diagnosis not present

## 2019-10-30 ENCOUNTER — Other Ambulatory Visit: Payer: Self-pay | Admitting: Cardiovascular Disease

## 2019-11-12 ENCOUNTER — Other Ambulatory Visit: Payer: Self-pay | Admitting: Physician Assistant

## 2019-11-19 DIAGNOSIS — Z20828 Contact with and (suspected) exposure to other viral communicable diseases: Secondary | ICD-10-CM | POA: Diagnosis not present

## 2019-11-19 DIAGNOSIS — I10 Essential (primary) hypertension: Secondary | ICD-10-CM | POA: Diagnosis not present

## 2019-11-19 DIAGNOSIS — R0602 Shortness of breath: Secondary | ICD-10-CM | POA: Diagnosis not present

## 2019-11-19 DIAGNOSIS — R05 Cough: Secondary | ICD-10-CM | POA: Diagnosis not present

## 2019-11-19 DIAGNOSIS — I5033 Acute on chronic diastolic (congestive) heart failure: Secondary | ICD-10-CM | POA: Diagnosis not present

## 2019-11-21 DIAGNOSIS — E1165 Type 2 diabetes mellitus with hyperglycemia: Secondary | ICD-10-CM | POA: Diagnosis not present

## 2019-11-21 DIAGNOSIS — I10 Essential (primary) hypertension: Secondary | ICD-10-CM | POA: Diagnosis not present

## 2019-11-26 ENCOUNTER — Other Ambulatory Visit: Payer: Self-pay | Admitting: Cardiovascular Disease

## 2019-12-05 ENCOUNTER — Other Ambulatory Visit: Payer: Self-pay | Admitting: Cardiovascular Disease

## 2019-12-10 DIAGNOSIS — I5033 Acute on chronic diastolic (congestive) heart failure: Secondary | ICD-10-CM | POA: Diagnosis not present

## 2019-12-10 DIAGNOSIS — I10 Essential (primary) hypertension: Secondary | ICD-10-CM | POA: Diagnosis not present

## 2019-12-10 DIAGNOSIS — E1165 Type 2 diabetes mellitus with hyperglycemia: Secondary | ICD-10-CM | POA: Diagnosis not present

## 2019-12-10 DIAGNOSIS — E1121 Type 2 diabetes mellitus with diabetic nephropathy: Secondary | ICD-10-CM | POA: Diagnosis not present

## 2019-12-10 DIAGNOSIS — E782 Mixed hyperlipidemia: Secondary | ICD-10-CM | POA: Diagnosis not present

## 2019-12-10 DIAGNOSIS — Z6839 Body mass index (BMI) 39.0-39.9, adult: Secondary | ICD-10-CM | POA: Diagnosis not present

## 2019-12-10 DIAGNOSIS — N184 Chronic kidney disease, stage 4 (severe): Secondary | ICD-10-CM | POA: Diagnosis not present

## 2019-12-10 DIAGNOSIS — M5431 Sciatica, right side: Secondary | ICD-10-CM | POA: Diagnosis not present

## 2019-12-18 ENCOUNTER — Other Ambulatory Visit: Payer: Self-pay

## 2019-12-18 ENCOUNTER — Ambulatory Visit: Payer: Medicare HMO | Admitting: Cardiovascular Disease

## 2019-12-18 ENCOUNTER — Telehealth: Payer: Self-pay | Admitting: Cardiovascular Disease

## 2019-12-18 ENCOUNTER — Encounter: Payer: Self-pay | Admitting: Cardiovascular Disease

## 2019-12-18 VITALS — BP 130/64 | HR 80 | Ht 63.0 in | Wt 225.4 lb

## 2019-12-18 DIAGNOSIS — I5042 Chronic combined systolic (congestive) and diastolic (congestive) heart failure: Secondary | ICD-10-CM | POA: Diagnosis not present

## 2019-12-18 DIAGNOSIS — I442 Atrioventricular block, complete: Secondary | ICD-10-CM

## 2019-12-18 DIAGNOSIS — R0602 Shortness of breath: Secondary | ICD-10-CM

## 2019-12-18 DIAGNOSIS — Z950 Presence of cardiac pacemaker: Secondary | ICD-10-CM

## 2019-12-18 DIAGNOSIS — I429 Cardiomyopathy, unspecified: Secondary | ICD-10-CM

## 2019-12-18 DIAGNOSIS — N184 Chronic kidney disease, stage 4 (severe): Secondary | ICD-10-CM

## 2019-12-18 DIAGNOSIS — I5022 Chronic systolic (congestive) heart failure: Secondary | ICD-10-CM

## 2019-12-18 DIAGNOSIS — I10 Essential (primary) hypertension: Secondary | ICD-10-CM | POA: Diagnosis not present

## 2019-12-18 NOTE — Telephone Encounter (Signed)
Pre-cert Verification for the following procedure    ECHO  DATE:    12/24/2019  LOCATION:  [facility]

## 2019-12-18 NOTE — Patient Instructions (Addendum)
Medication Instructions:  Continue all current medications.  Labwork:  BMET - order given today.   Office will contact with results via phone or letter.    Testing/Procedures:  Your physician has requested that you have an echocardiogram. Echocardiography is a painless test that uses sound waves to create images of your heart. It provides your doctor with information about the size and shape of your heart and how well your heart's chambers and valves are working. This procedure takes approximately one hour. There are no restrictions for this procedure.  Office will contact with results via phone or letter.    Follow-Up: 4 months - office   Any Other Special Instructions Will Be Listed Below (If Applicable). You have been referred to:  [facility]   If you need a refill on your cardiac medications before your next appointment, please call your pharmacy.

## 2019-12-18 NOTE — Progress Notes (Signed)
SUBJECTIVE: The patient presents for follow-up of CHF. Past medical history also includes insulin-dependent diabetes mellitus, hypertension, CKD stage IV, hyperlipidemia, and breast cancer status post right mastectomy.  She also has a history of complete heart block.  Echocardiogram on 04/23/2019 demonstrated normalization of LV systolic function, EF 60 to 65%.  There was grade 1 diastolic dysfunction.  There was moderate left atrial dilatation and a trivial pericardial effusion.  She has a biventricular pacemaker.  She has been more short of breath over the last few months.  She feels something has changed.  Her PCP increase Lasix to 60 mg daily and had her weigh daily.  Her weight has been gradually dropping over the last 6 weeks.  Her shortness of breath has improved.  She denies chest pain.  She has an appointment with her PCP next month and sees Dr. Lovena Le next month as well.  She is not had any recent blood work.  She has not seen her nephrologist recently.  She would like to see a different nephrologist.    Review of Systems: As per "subjective", otherwise negative.  No Known Allergies  Current Outpatient Medications  Medication Sig Dispense Refill  . aspirin EC 81 MG tablet Take 81 mg by mouth daily.    . carvedilol (COREG) 6.25 MG tablet Take 0.5 tablets (3.125 mg total) by mouth 2 (two) times daily. 30 tablet 4  . Cholecalciferol (VITAMIN D) 2000 UNITS tablet Take 2,000 Units by mouth daily.     . furosemide (LASIX) 40 MG tablet TAKE 1 TABLET EVERY DAY FOR 5 DAYS, THEN DAILY AS NEEDED FOR WEIGHT GAIN OF 3 POUNDS IN 24 HOURS, OR 5 POUNDS IN 1 WEEK (Patient taking differently: Take 60 mg by mouth daily. ) 90 tablet 0  . glipiZIDE (GLUCOTROL XL) 5 MG 24 hr tablet Take 5 mg by mouth in the morning and at bedtime.     . hydrALAZINE (APRESOLINE) 50 MG tablet TAKE 1 TABLET (50 MG TOTAL) BY MOUTH 3 (THREE) TIMES DAILY. 270 tablet 0  . losartan (COZAAR) 100 MG tablet Take 1  tablet by mouth daily.    . potassium chloride (KLOR-CON) 10 MEQ tablet TAKE 1 TABLET EVERY DAY AS NEEDED  (Patient taking differently: Take 10 mEq by mouth daily. ) 90 tablet 0  . pravastatin (PRAVACHOL) 10 MG tablet Take 10 mg by mouth at bedtime.     . vitamin B-12 (CYANOCOBALAMIN) 1000 MCG tablet Take 1,000 mcg by mouth daily.     No current facility-administered medications for this visit.    Past Medical History:  Diagnosis Date  . Acute renal insufficiency 10/15/2013  . Arthritis   . Cancer [facility]) 2008   right breast  . Cancer of breast- s/p chemo and Rt mastectomy 2008 10/13/2013  . Cardiomyopathy ([place])    dating back to 2010  . CHB (complete heart block) ([place]) 10/13/2013  . Chronic combined systolic and diastolic congestive heart failure ([place])   . Diabetes mellitus    with neuropathy  . Diabetes mellitus type 2, controlled, without complications ([place]) 0/97/3532  . Dyslipidemia 10/13/2013  . History of blood transfusion   . Hypercholesterolemia   . Hypertension   . Mitral stenosis- moderate with trivial MR  10/15/2013  . Pacemaker 10/23/2014  . Presence of permanent cardiac pacemaker   . Primary osteoarthritis of left knee 11/04/2015  . Syncope 10/13/2013  . Tingling    feet bilat     Past Surgical History:  Procedure Laterality Date  . APPENDECTOMY     mmh  . BI-VENTRICULAR PACEMAKER INSERTION N/A 10/15/2013   Procedure: BI-VENTRICULAR PACEMAKER INSERTION (CRT-P);  Surgeon: Evans Lance, MD;  Location: [facility] CATH LAB;  Service: Cardiovascular;  Laterality: N/A;  . Cardiomyopathy    . CATARACT EXTRACTION W/PHACO  06/29/2011   Procedure: CATARACT EXTRACTION PHACO AND INTRAOCULAR LENS PLACEMENT (IOC);  Surgeon: Tonny Branch;  Location: [facility] ORS;  Service: Ophthalmology;  Laterality: Right;  CDE: 14.41  . CATARACT EXTRACTION W/PHACO  09/04/2011   Procedure: CATARACT EXTRACTION PHACO AND INTRAOCULAR LENS PLACEMENT (IOC);  Surgeon: Tonny Branch;  Location: [facility] ORS;  Service:  Ophthalmology;  Laterality: Left;  CDE: 15.21  . CHOLECYSTECTOMY     mmh  . KNEE ARTHROSCOPY     right-mmh  . MASTECTOMY  2008    mmh-right  . TEMPORARY PACEMAKER INSERTION N/A 10/13/2013   Procedure: TEMPORARY PACEMAKER INSERTION;  Surgeon: Jolaine Artist, MD;  Location: [facility] CATH LAB;  Service: Cardiovascular;  Laterality: N/A;  . TOTAL KNEE ARTHROPLASTY Left 11/04/2015   Procedure: LEFT TOTAL KNEE ARTHROPLASTY;  Surgeon: Rod Can, MD;  Location: [facility] ORS;  Service: Orthopedics;  Laterality: Left;    Social History   Socioeconomic History  . Marital status: Widowed    Spouse name: Not on file  . Number of children: Not on file  . Years of education: Not on file  . Highest education level: Not on file  Occupational History  . Not on file  Tobacco Use  . Smoking status: Never Smoker  . Smokeless tobacco: Never Used  Substance and Sexual Activity  . Alcohol use: No    Alcohol/week: 0.0 standard drinks  . Drug use: No  . Sexual activity: Yes    Birth control/protection: Post-menopausal  Other Topics Concern  . Not on file  Social History Narrative  . Not on file   Social Determinants of Health   Financial Resource Strain:   . Difficulty of Paying Living Expenses:   Food Insecurity:   . Worried About Charity fundraiser in the Last Year:   . Arboriculturist in the Last Year:   Transportation Needs:   . Film/video editor (Medical):   Marland Kitchen Lack of Transportation (Non-Medical):   Physical Activity:   . Days of Exercise per Week:   . Minutes of Exercise per Session:   Stress:   . Feeling of Stress :   Social Connections:   . Frequency of Communication with Friends and Family:   . Frequency of Social Gatherings with Friends and Family:   . Attends Religious Services:   . Active Member of Clubs or Organizations:   . Attends Archivist Meetings:   Marland Kitchen Marital Status:   Intimate Partner Violence:   . Fear of Current or Ex-Partner:   . Emotionally Abused:    Marland Kitchen Physically Abused:   . Sexually Abused:     Orson Slick, LPN was present throughout the entirety of the encounter.  Vitals:   12/18/19 1120  BP: 130/64  Pulse: 80  SpO2: 96%  Weight: 225 lb 6.4 oz (102.2 kg)  Height: 5\' 3"  (1.6 m)    Wt Readings from Last 3 Encounters:  12/18/19 225 lb 6.4 oz (102.2 kg)  07/26/19 233 lb (105.7 kg)  06/24/19 238 lb (108 kg)     PHYSICAL EXAM General: NAD HEENT: Normal. Neck: No JVD, no thyromegaly. Lungs: Clear to auscultation bilaterally with normal respiratory effort. CV: Regular rate  and rhythm, normal S1/S2, no S3/S4, no murmur. No pretibial or periankle edema.  No carotid bruit.   Abdomen: Soft, nontender, no distention.  Neurologic: Alert and oriented.  Psych: Normal affect. Skin: Normal. Musculoskeletal: No gross deformities.      Labs: Lab Results  Component Value Date/Time   K 4.3 07/26/2019 12:40 PM   BUN 59 (H) 07/26/2019 12:40 PM   CREATININE 2.64 (H) 07/26/2019 12:40 PM   ALT 12 (L) 12/14/2015 09:59 PM   TSH 1.033 10/13/2013 08:00 PM   HGB 10.3 (L) 07/26/2019 12:40 PM     Lipids: No results found for: LDLCALC, LDLDIRECT, CHOL, TRIG, HDL     ASSESSMENT AND PLAN:  1.  Chronic combined heart failure: She has grade 1 diastolic dysfunction by most recent echocardiogram.  LV function was normal in July 2020.  Weight is down 8 pounds since 07/26/2019.    She appears euvolemic.  Blood pressure is normal.  BUN 66 and creatinine 2.35 on 05/06/2019.  She remains on losartan, carvedilol, and hydralazine.  She is also on Lasix 60 mg daily as prescribed by her PCP.  I will obtain a basic metabolic panel as she has not had any recent blood work.  She has been more short of breath over the last few months and feels something has changed. I will obtain a follow-up echocardiogram to assess for interval changes in cardiac structure and function given her change in symptoms over the last few months.  2.  Hypertension: Blood  pressure is normal.  No changes to therapy.  3.  Status post biventricular pacemaker for complete heart block: Normal device function.  She sees Dr. Lovena Le next month.   4.  Chronic kidney disease stage IV: BUN 59, creatinine 2.64 on 07/26/2019.  I will obtain a basic metabolic panel.  She would like to see a different nephrologist.  For that reason, I will make a referral to [facility].   Disposition: Follow up with Dr. Lovena Le as scheduled.  Follow-up with me in July 2021.   Kate Sable, M.D., F.A.C.C.

## 2019-12-19 ENCOUNTER — Other Ambulatory Visit (HOSPITAL_COMMUNITY)
Admission: RE | Admit: 2019-12-19 | Discharge: 2019-12-19 | Disposition: A | Discharge status: Home / Self Care | Payer: Medicare HMO | Source: Ambulatory Visit | Attending: Cardiovascular Disease | Admitting: Cardiovascular Disease

## 2019-12-19 DIAGNOSIS — I2699 Other pulmonary embolism without acute cor pulmonale: Secondary | ICD-10-CM | POA: Diagnosis not present

## 2019-12-19 DIAGNOSIS — N184 Chronic kidney disease, stage 4 (severe): Secondary | ICD-10-CM | POA: Insufficient documentation

## 2019-12-19 DIAGNOSIS — I5022 Chronic systolic (congestive) heart failure: Secondary | ICD-10-CM | POA: Insufficient documentation

## 2019-12-19 DIAGNOSIS — R0602 Shortness of breath: Secondary | ICD-10-CM | POA: Diagnosis not present

## 2019-12-19 LAB — BASIC METABOLIC PANEL
Anion gap: 10 (ref 5–15)
BUN: 80 mg/dL — ABNORMAL HIGH (ref 8–23)
CO2: 21 mmol/L — ABNORMAL LOW (ref 22–32)
Calcium: 9 mg/dL (ref 8.9–10.3)
Chloride: 109 mmol/L (ref 98–111)
Creatinine, Ser: 3.37 mg/dL — ABNORMAL HIGH (ref 0.44–1.00)
GFR calc Af Amer: 15 mL/min — ABNORMAL LOW (ref >60)
GFR calc non Af Amer: 13 mL/min — ABNORMAL LOW (ref >60)
Glucose, Bld: 189 mg/dL — ABNORMAL HIGH (ref 70–99)
Potassium: 4.5 mmol/L (ref 3.5–5.1)
Sodium: 140 mmol/L (ref 135–145)

## 2019-12-21 ENCOUNTER — Emergency Department (HOSPITAL_COMMUNITY): Payer: Medicare HMO

## 2019-12-21 ENCOUNTER — Inpatient Hospital Stay (HOSPITAL_COMMUNITY)
Admission: EM | Admit: 2019-12-21 | Discharge: 2019-12-23 | DRG: 176 | Disposition: A | Discharge status: Home / Self Care | Payer: Medicare HMO | Attending: Family Medicine | Admitting: Family Medicine

## 2019-12-21 ENCOUNTER — Other Ambulatory Visit: Payer: Self-pay

## 2019-12-21 ENCOUNTER — Encounter (HOSPITAL_COMMUNITY): Payer: Self-pay

## 2019-12-21 DIAGNOSIS — I442 Atrioventricular block, complete: Secondary | ICD-10-CM | POA: Diagnosis present

## 2019-12-21 DIAGNOSIS — Z9011 Acquired absence of right breast and nipple: Secondary | ICD-10-CM

## 2019-12-21 DIAGNOSIS — Z79899 Other long term (current) drug therapy: Secondary | ICD-10-CM

## 2019-12-21 DIAGNOSIS — C50919 Malignant neoplasm of unspecified site of unspecified female breast: Secondary | ICD-10-CM | POA: Diagnosis present

## 2019-12-21 DIAGNOSIS — Z833 Family history of diabetes mellitus: Secondary | ICD-10-CM

## 2019-12-21 DIAGNOSIS — R071 Chest pain on breathing: Secondary | ICD-10-CM

## 2019-12-21 DIAGNOSIS — I361 Nonrheumatic tricuspid (valve) insufficiency: Secondary | ICD-10-CM | POA: Diagnosis not present

## 2019-12-21 DIAGNOSIS — R7989 Other specified abnormal findings of blood chemistry: Secondary | ICD-10-CM | POA: Diagnosis not present

## 2019-12-21 DIAGNOSIS — E7800 Pure hypercholesterolemia, unspecified: Secondary | ICD-10-CM | POA: Diagnosis present

## 2019-12-21 DIAGNOSIS — I1310 Hypertensive heart and chronic kidney disease without heart failure, with stage 1 through stage 4 chronic kidney disease, or unspecified chronic kidney disease: Secondary | ICD-10-CM | POA: Diagnosis present

## 2019-12-21 DIAGNOSIS — R0902 Hypoxemia: Secondary | ICD-10-CM | POA: Diagnosis not present

## 2019-12-21 DIAGNOSIS — E119 Type 2 diabetes mellitus without complications: Secondary | ICD-10-CM

## 2019-12-21 DIAGNOSIS — R0600 Dyspnea, unspecified: Secondary | ICD-10-CM

## 2019-12-21 DIAGNOSIS — R079 Chest pain, unspecified: Secondary | ICD-10-CM | POA: Diagnosis not present

## 2019-12-21 DIAGNOSIS — M1712 Unilateral primary osteoarthritis, left knee: Secondary | ICD-10-CM | POA: Diagnosis present

## 2019-12-21 DIAGNOSIS — Z7982 Long term (current) use of aspirin: Secondary | ICD-10-CM

## 2019-12-21 DIAGNOSIS — R778 Other specified abnormalities of plasma proteins: Secondary | ICD-10-CM

## 2019-12-21 DIAGNOSIS — I050 Rheumatic mitral stenosis: Secondary | ICD-10-CM | POA: Diagnosis not present

## 2019-12-21 DIAGNOSIS — Z9842 Cataract extraction status, left eye: Secondary | ICD-10-CM | POA: Diagnosis not present

## 2019-12-21 DIAGNOSIS — Z20822 Contact with and (suspected) exposure to covid-19: Secondary | ICD-10-CM | POA: Diagnosis not present

## 2019-12-21 DIAGNOSIS — N184 Chronic kidney disease, stage 4 (severe): Secondary | ICD-10-CM | POA: Diagnosis present

## 2019-12-21 DIAGNOSIS — Z9221 Personal history of antineoplastic chemotherapy: Secondary | ICD-10-CM

## 2019-12-21 DIAGNOSIS — E1122 Type 2 diabetes mellitus with diabetic chronic kidney disease: Secondary | ICD-10-CM | POA: Diagnosis present

## 2019-12-21 DIAGNOSIS — Z8249 Family history of ischemic heart disease and other diseases of the circulatory system: Secondary | ICD-10-CM

## 2019-12-21 DIAGNOSIS — Z950 Presence of cardiac pacemaker: Secondary | ICD-10-CM | POA: Diagnosis not present

## 2019-12-21 DIAGNOSIS — R0609 Other forms of dyspnea: Secondary | ICD-10-CM

## 2019-12-21 DIAGNOSIS — I10 Essential (primary) hypertension: Secondary | ICD-10-CM | POA: Diagnosis present

## 2019-12-21 DIAGNOSIS — Z853 Personal history of malignant neoplasm of breast: Secondary | ICD-10-CM | POA: Diagnosis not present

## 2019-12-21 DIAGNOSIS — Z9841 Cataract extraction status, right eye: Secondary | ICD-10-CM

## 2019-12-21 DIAGNOSIS — I5042 Chronic combined systolic (congestive) and diastolic (congestive) heart failure: Secondary | ICD-10-CM | POA: Diagnosis present

## 2019-12-21 DIAGNOSIS — E1165 Type 2 diabetes mellitus with hyperglycemia: Secondary | ICD-10-CM | POA: Diagnosis not present

## 2019-12-21 DIAGNOSIS — I429 Cardiomyopathy, unspecified: Secondary | ICD-10-CM | POA: Diagnosis not present

## 2019-12-21 DIAGNOSIS — Z96652 Presence of left artificial knee joint: Secondary | ICD-10-CM | POA: Diagnosis present

## 2019-12-21 DIAGNOSIS — R0602 Shortness of breath: Secondary | ICD-10-CM

## 2019-12-21 DIAGNOSIS — E785 Hyperlipidemia, unspecified: Secondary | ICD-10-CM | POA: Diagnosis present

## 2019-12-21 DIAGNOSIS — Z7984 Long term (current) use of oral hypoglycemic drugs: Secondary | ICD-10-CM

## 2019-12-21 DIAGNOSIS — I371 Nonrheumatic pulmonary valve insufficiency: Secondary | ICD-10-CM | POA: Diagnosis not present

## 2019-12-21 DIAGNOSIS — R0789 Other chest pain: Secondary | ICD-10-CM | POA: Diagnosis not present

## 2019-12-21 DIAGNOSIS — I2699 Other pulmonary embolism without acute cor pulmonale: Secondary | ICD-10-CM | POA: Diagnosis not present

## 2019-12-21 DIAGNOSIS — Z961 Presence of intraocular lens: Secondary | ICD-10-CM | POA: Diagnosis present

## 2019-12-21 LAB — CBC WITH DIFFERENTIAL/PLATELET
Abs Immature Granulocytes: 0.02 10*3/uL (ref 0.00–0.07)
Basophils Absolute: 0.1 10*3/uL (ref 0.0–0.1)
Basophils Relative: 1 %
Eosinophils Absolute: 0.1 10*3/uL (ref 0.0–0.5)
Eosinophils Relative: 1 %
HCT: 38.4 % (ref 36.0–46.0)
Hemoglobin: 11.9 g/dL — ABNORMAL LOW (ref 12.0–15.0)
Immature Granulocytes: 0 %
Lymphocytes Relative: 20 %
Lymphs Abs: 2.1 10*3/uL (ref 0.7–4.0)
MCH: 28.9 pg (ref 26.0–34.0)
MCHC: 31 g/dL (ref 30.0–36.0)
MCV: 93.2 fL (ref 80.0–100.0)
Monocytes Absolute: 0.6 10*3/uL (ref 0.1–1.0)
Monocytes Relative: 5 %
Neutro Abs: 7.3 10*3/uL (ref 1.7–7.7)
Neutrophils Relative %: 73 %
Platelets: 253 10*3/uL (ref 150–400)
RBC: 4.12 MIL/uL (ref 3.87–5.11)
RDW: 14.7 % (ref 11.5–15.5)
WBC: 10.1 10*3/uL (ref 4.0–10.5)
nRBC: 0 % (ref 0.0–0.2)

## 2019-12-21 LAB — COMPREHENSIVE METABOLIC PANEL
ALT: 17 U/L (ref 0–44)
AST: 17 U/L (ref 15–41)
Albumin: 3.2 g/dL — ABNORMAL LOW (ref 3.5–5.0)
Alkaline Phosphatase: 63 U/L (ref 38–126)
Anion gap: 9 (ref 5–15)
BUN: 96 mg/dL — ABNORMAL HIGH (ref 8–23)
CO2: 20 mmol/L — ABNORMAL LOW (ref 22–32)
Calcium: 8.8 mg/dL — ABNORMAL LOW (ref 8.9–10.3)
Chloride: 111 mmol/L (ref 98–111)
Creatinine, Ser: 3.88 mg/dL — ABNORMAL HIGH (ref 0.44–1.00)
GFR calc Af Amer: 12 mL/min — ABNORMAL LOW (ref >60)
GFR calc non Af Amer: 11 mL/min — ABNORMAL LOW (ref >60)
Glucose, Bld: 209 mg/dL — ABNORMAL HIGH (ref 70–99)
Potassium: 4.6 mmol/L (ref 3.5–5.1)
Sodium: 140 mmol/L (ref 135–145)
Total Bilirubin: 0.3 mg/dL (ref 0.3–1.2)
Total Protein: 6.7 g/dL (ref 6.5–8.1)

## 2019-12-21 LAB — TSH: TSH: 4.918 u[IU]/mL — ABNORMAL HIGH (ref 0.350–4.500)

## 2019-12-21 LAB — D-DIMER, QUANTITATIVE: D-Dimer, Quant: 7.78 ug/mL-FEU — ABNORMAL HIGH (ref 0.00–0.50)

## 2019-12-21 LAB — TROPONIN I (HIGH SENSITIVITY): Troponin I (High Sensitivity): 97 ng/L — ABNORMAL HIGH (ref <18)

## 2019-12-21 MED ORDER — TECHNETIUM TO 99M ALBUMIN AGGREGATED
1.6000 | Freq: Once | INTRAVENOUS | Status: AC | PRN
  Administered 2019-12-21: 1.6 via INTRAVENOUS

## 2019-12-21 NOTE — ED Notes (Signed)
Dr Cook in to see pt. 

## 2019-12-21 NOTE — ED Provider Notes (Addendum)
[facility] EMERGENCY DEPARTMENT Provider Note   CSN: [id number] Arrival date & time: 12/21/19  1453     History Chief Complaint  Patient presents with  . Chest Pain    Cassandra Parker is a 79 y.o. female.  Shortness of breath for several months, questionable worse today with associated chest pain.  She has been evaluated by her primary care doctor and cardiologist.  Lasix recently increased from 40 to 60 mg daily.  Questionable hypotension and low O2 sats at home.  (This has not been confirmed here).  Patient has a pacemaker secondary to complete heart block, history of CHF, diabetes, mitral stenosis, hypertension, hypercholesterolemia, many others.        Past Medical History:  Diagnosis Date  . Acute renal insufficiency 10/15/2013  . Arthritis   . Cancer [facility]) 2008   right breast  . Cancer of breast- s/p chemo and Rt mastectomy 2008 10/13/2013  . Cardiomyopathy ([place])    dating back to 2010  . CHB (complete heart block) ([place]) 10/13/2013  . Chronic combined systolic and diastolic congestive heart failure ([place])   . Diabetes mellitus    with neuropathy  . Diabetes mellitus type 2, controlled, without complications ([place]) 1/93/7902  . Dyslipidemia 10/13/2013  . History of blood transfusion   . Hypercholesterolemia   . Hypertension   . Mitral stenosis- moderate with trivial MR  10/15/2013  . Pacemaker 10/23/2014  . Presence of permanent cardiac pacemaker   . Primary osteoarthritis of left knee 11/04/2015  . Syncope 10/13/2013  . Tingling    feet bilat     Patient Active Problem List   Diagnosis Date Noted  . Chest pain 12/21/2019  . Primary osteoarthritis of left knee 11/04/2015  . Pacemaker 10/23/2014  . Acute renal insufficiency 10/15/2013  . Mitral stenosis- moderate with trivial MR  10/15/2013  . CHB (complete heart block) ([place]) 10/13/2013  . Syncope 10/13/2013  . HTN (hypertension) 10/13/2013  . Diabetes mellitus type 2, controlled, without complications ([place])  40/97/3532  . Cancer of breast- s/p chemo and Rt mastectomy 2008 10/13/2013  . Cardiomyopathy- EF 35-40% (etiology not yet determined) 10/13/2013  . Dyslipidemia 10/13/2013    Past Surgical History:  Procedure Laterality Date  . APPENDECTOMY     mmh  . BI-VENTRICULAR PACEMAKER INSERTION N/A 10/15/2013   Procedure: BI-VENTRICULAR PACEMAKER INSERTION (CRT-P);  Surgeon: Evans Lance, MD;  Location: [facility] CATH LAB;  Service: Cardiovascular;  Laterality: N/A;  . Cardiomyopathy    . CATARACT EXTRACTION W/PHACO  06/29/2011   Procedure: CATARACT EXTRACTION PHACO AND INTRAOCULAR LENS PLACEMENT (IOC);  Surgeon: Tonny Branch;  Location: [facility] ORS;  Service: Ophthalmology;  Laterality: Right;  CDE: 14.41  . CATARACT EXTRACTION W/PHACO  09/04/2011   Procedure: CATARACT EXTRACTION PHACO AND INTRAOCULAR LENS PLACEMENT (IOC);  Surgeon: Tonny Branch;  Location: [facility] ORS;  Service: Ophthalmology;  Laterality: Left;  CDE: 15.21  . CHOLECYSTECTOMY     mmh  . KNEE ARTHROSCOPY     right-mmh  . MASTECTOMY  2008    mmh-right  . TEMPORARY PACEMAKER INSERTION N/A 10/13/2013   Procedure: TEMPORARY PACEMAKER INSERTION;  Surgeon: Jolaine Artist, MD;  Location: [facility] CATH LAB;  Service: Cardiovascular;  Laterality: N/A;  . TOTAL KNEE ARTHROPLASTY Left 11/04/2015   Procedure: LEFT TOTAL KNEE ARTHROPLASTY;  Surgeon: Rod Can, MD;  Location: [facility] ORS;  Service: Orthopedics;  Laterality: Left;     OB History    Gravida  3   Para  3  Term  3   Preterm      AB      Living        SAB      TAB      Ectopic      Multiple      Live Births              Family History  Problem Relation Age of Onset  . Hypertension Father   . Diabetes Father   . Anesthesia problems Neg Hx   . Hypotension Neg Hx   . Malignant hyperthermia Neg Hx   . Pseudochol deficiency Neg Hx     Social History   Tobacco Use  . Smoking status: Never Smoker  . Smokeless tobacco: Never Used  Substance Use Topics  . Alcohol use:  No    Alcohol/week: 0.0 standard drinks  . Drug use: No    Home Medications Prior to Admission medications   Medication Sig Start Date End Date Taking? Authorizing Provider  aspirin 81 MG chewable tablet Chew 243 mg by mouth once.    Yes [provider]  aspirin EC 81 MG tablet Take 81 mg by mouth daily.   Yes [provider]  carvedilol (COREG) 6.25 MG tablet Take 0.5 tablets (3.125 mg total) by mouth 2 (two) times daily. Patient taking differently: Take 6.25 mg by mouth 2 (two) times daily with a meal.  10/16/13  Yes Edmisten, Brooke O, PA-C  chlorthalidone (HYGROTON) 25 MG tablet Take 25 mg by mouth daily. 10/03/19  Yes [provider]  Cholecalciferol (VITAMIN D) 2000 UNITS tablet Take 2,000 Units by mouth daily.    Yes [provider]  furosemide (LASIX) 40 MG tablet TAKE 1 TABLET EVERY DAY FOR 5 DAYS, THEN DAILY AS NEEDED FOR WEIGHT GAIN OF 3 POUNDS IN 24 HOURS, OR 5 POUNDS IN 1 WEEK Patient taking differently: Take 60 mg by mouth in the morning.  12/05/19  Yes Herminio Commons, MD  glipiZIDE (GLUCOTROL XL) 5 MG 24 hr tablet Take 5 mg by mouth in the morning and at bedtime.    Yes [provider]  hydrALAZINE (APRESOLINE) 50 MG tablet TAKE 1 TABLET (50 MG TOTAL) BY MOUTH 3 (THREE) TIMES DAILY. Patient taking differently: Take 50 mg by mouth in the morning and at bedtime.  10/30/19  Yes Herminio Commons, MD  losartan (COZAAR) 100 MG tablet Take 100 mg by mouth daily.  10/15/19  Yes [provider]  potassium chloride (KLOR-CON) 10 MEQ tablet TAKE 1 TABLET EVERY DAY AS NEEDED  Patient taking differently: Take 10 mEq by mouth in the morning.  11/12/19  Yes Herminio Commons, MD  pravastatin (PRAVACHOL) 10 MG tablet Take 10 mg by mouth at bedtime.    Yes [provider]  vitamin B-12 (CYANOCOBALAMIN) 1000 MCG tablet Take 1,000 mcg by mouth daily.   Yes [provider]    Allergies    Patient has no known  allergies.  Review of Systems   Review of Systems  All other systems reviewed and are negative.   Physical Exam Updated Vital Signs BP (!) 164/69   Pulse 75   Temp 98.2 F (36.8 C) (Oral)   Resp 17   Ht 5\' 3"  (1.6 m)   Wt 102.1 kg   SpO2 95%   BMI 39.86 kg/m   Physical Exam Vitals and nursing note reviewed.  Constitutional:      Appearance: She is well-developed.  Comments: No respiratory distress  HENT:     Head: Normocephalic and atraumatic.  Eyes:     Conjunctiva/sclera: Conjunctivae normal.  Cardiovascular:     Rate and Rhythm: Normal rate and regular rhythm.  Pulmonary:     Effort: Pulmonary effort is normal.     Breath sounds: Normal breath sounds.  Abdominal:     General: Bowel sounds are normal.     Palpations: Abdomen is soft.  Musculoskeletal:        General: Normal range of motion.     Cervical back: Neck supple.  Skin:    General: Skin is warm and dry.     Comments: No peripheral edema.  Neurological:     General: No focal deficit present.     Mental Status: She is alert and oriented to person, place, and time.  Psychiatric:        Behavior: Behavior normal.     ED Results / Procedures / Treatments   Labs (all labs ordered are listed, but only abnormal results are displayed) Labs Reviewed  CBC WITH DIFFERENTIAL/PLATELET - Abnormal; Notable for the following components:      Result Value   Hemoglobin 11.9 (*)    All other components within normal limits  COMPREHENSIVE METABOLIC PANEL - Abnormal; Notable for the following components:   CO2 20 (*)    Glucose, Bld 209 (*)    BUN 96 (*)    Creatinine, Ser 3.88 (*)    Calcium 8.8 (*)    Albumin 3.2 (*)    GFR calc non Af Amer 11 (*)    GFR calc Af Amer 12 (*)    All other components within normal limits  D-DIMER, QUANTITATIVE (NOT AT [facility]) - Abnormal; Notable for the following components:   D-Dimer, Quant 7.78 (*)    All other components within normal limits  TSH - Abnormal; Notable for  the following components:   TSH 4.918 (*)    All other components within normal limits  TROPONIN I (HIGH SENSITIVITY) - Abnormal; Notable for the following components:   Troponin I (High Sensitivity) 97 (*)    All other components within normal limits  SARS CORONAVIRUS 2 (TAT 6-24 HRS)  TROPONIN I (HIGH SENSITIVITY)    EKG EKG Interpretation  Date/Time:  Sunday December 21 2019 15:04:35 EDT Ventricular Rate:  90 PR Interval:    QRS Duration: 145 QT Interval:  439 QTC Calculation: 538 R Axis:   -94 Text Interpretation: Sinus rhythm Borderline short PR interval Probable left atrial enlargement IVCD, consider atypical RBBB LVH with secondary repolarization abnormality Anterior Q waves, possibly due to LVH Baseline wander in lead(s) V6 Confirmed by Nat Christen [phone]) on 12/21/2019 4:30:32 PM   Radiology DG Chest Port 1 View  Result Date: 12/21/2019 CLINICAL DATA:  Chest pain, shortness of breath, history of pacemaker EXAM: PORTABLE CHEST 1 VIEW COMPARISON:  11/19/2019 FINDINGS: Single frontal view of the chest demonstrates stable multi lead pacemaker and enlarged cardiac silhouette. Stable central vascular congestion without airspace disease, effusion, or pneumothorax. IMPRESSION: 1. Stable exam, no acute process. Electronically Signed   By: Randa Ngo M.D.   On: 12/21/2019 16:14    Procedures Procedures (including critical care time)  Medications Ordered in ED Medications  technetium albumin aggregated (MAA) injection solution 1.6 millicurie (1.6 millicuries Intravenous Contrast Given 12/21/19 1955)    ED Course  I have reviewed the triage vital signs and the nursing notes.  Pertinent labs & imaging results that were available during  my care of the patient were reviewed by me and considered in my medical decision making (see chart for details).    MDM Rules/Calculators/A&P                      Patient is not in respiratory distress.  Will do basic screening tests including  chest x-ray, EKG, troponin, D-dimer  2100: D-dimer and creatinine elevated.  Will need to order ventilation/perfusion scan.  2300: Ventilation portion of VQ scan unable be performed until Covid negativity is confirmed.  Additionally, troponin slightly elevated (97).  Will admit to obs.  2330: Discussed with daughter at [phone].   CRITICAL CARE Performed by: Nat Christen Total critical care time: 30 minutes Critical care time was exclusive of separately billable procedures and treating other patients. Critical care was necessary to treat or prevent imminent or life-threatening deterioration. Critical care was time spent personally by me on the following activities: development of treatment plan with patient and/or surrogate as well as nursing, discussions with consultants, evaluation of patient's response to treatment, examination of patient, obtaining history from patient or surrogate, ordering and performing treatments and interventions, ordering and review of laboratory studies, ordering and review of radiographic studies, pulse oximetry and re-evaluation of patient's condition. Final Clinical Impression(s) / ED Diagnoses Final diagnoses:  Dyspnea, unspecified type  Chest pain, unspecified type  Elevated troponin  Elevated serum creatinine  Elevated d-dimer    Rx / DC Orders ED Discharge Orders    None       Nat Christen, MD 12/21/19 1637    Nat Christen, MD 12/21/19 2919    Nat Christen, MD 12/21/19 [phone]

## 2019-12-21 NOTE — H&P (Signed)
History and Physical    Patient Demographics:    Cassandra Parker RKY:[id number] DOB: 12/23/1940 DOA: 12/21/2019  PCP: Manon Hilding, MD  Patient coming from: Home  I have personally briefly reviewed patient's old medical records in [place]  Chief Complaint: Chest pain   Assessment & Plan:     Assessment/Plan Principal Problem:   Chest pain Active Problems:   CHB (complete heart block) (HCC)   HTN (hypertension)   Diabetes mellitus type 2, controlled, without complications ([place])   Cancer of breast- s/p chemo and Rt mastectomy 2008   Cardiomyopathy- EF 35-40% (etiology not yet determined)   Dyslipidemia   Mitral stenosis- moderate with trivial MR    Pacemaker   Primary osteoarthritis of left knee     Principal Problem: Chest pain possibly secondary to pulmonary embolism Patient presented with worsening chest pain over several weeks.  She was thought to have worsening CHF and had had an increase in her Lasix dose recently.  Had worsening chest pain today and presented to the ER.  Was apparently found to be hypoxemic by EMS into the 70s.  Noted to have an elevated D-dimer as well as troponin on presentation.  A VQ scan was ordered as a CTA could not be done due to renal failure.  The perfusion portion of the VQ scan found a single large perfusion defect in the right middle lobe with possible small defect in the posterior right lower lobe and anterior left lung apex.  The ventilation portion could not be performed as the patient's Covid test had not resulted. -will need ventilation portion of V/Q scan in the morning  -Continue heparin drip -Repeat echocardiogram has been ordered -Bilateral lower extremity venous Doppler  Other Active Problems: History of Complete Heart Block s/p Pacemaker placement -EKG shows paced rhythm.  Stable.  Routine follow-up as outpatient.  Chronic congestive Heart Failure with preserved EF Echocardiogram on 04/23/2019 demonstrated  normalization of LV systolic function, EF 60 to 65%. There was grade 1 diastolic dysfunction. There was moderate left atrial dilatation and a trivial pericardial effusion.  Patient appears euvolemic currently. -Will decrease Lasix to 40 mg daily from 60 mg due to worsening renal function. -Repeat echocardiogram has been ordered -Cardiology consult in a.m. -Continue beta-blockers, aspirin, statin  Acute on chronic renal failure with baseline chronic Kidney Disease stage 4 Patient presented with a creatinine of 3.8.  Renal function appears to be worsening.  Creatinine was fluctuating between 1.9 and 2.6 in 2017 and had been at 2.6 in 2020.  May be a gradual decline in baseline kidney function versus acute worsening during current presentation.  Does have an elevated BUN.  Has been on diuretics and I suspect she is having a decline in function overall. -We will monitor input output, renal function closely -Nephrology consult in a.m.  Hypertension -Hold losartan due to worsening renal function -Continue losartan, Coreg  Hyperlipidemia -Continue statin  Diabetes mellitus type 2 -Sliding scale insulin coverage with fingerstick monitoring -Hold glipizide for now  History of breast cancer Status post right-sided mastectomy and chemotherapy -Stable, follow-up as outpatient  DVT prophylaxis: Heparin Code Status:  Full code Family Communication: N/A  Disposition Plan: Place in observation for work-up of chest pain Consults called: N/A Admission status: Observation stay    HPI:     HPI: Cassandra Parker is a 79 y.o. female with medical history significant of insulin-dependent diabetes mellitus, hypertension, CKD stage IV, hyperlipidemia, and breast cancer status post right mastectomy.  She also has a history of complete heart block. She presented to the ER with chest pain ongoing for several months.  She also reported some exertional dyspnea to her PCP and had an increase in Lasix from  40 to 60 mg.  Saw her cardiologist on 12/18/2019 and at that time was thought to be euvolemic.  Patient reports having on and off intermittent chest pain going across the entire chest since at least November.  Has had some associated shortness of breath and exertional dyspnea.  Was reported by EMS to have a room air saturation in the 70s. No nausea, vomiting, abdominal pain, fever, chills, palpitations, dysuria, diarrhea, seizures, syncope, focal deficits, dizziness, lightheadedness. ED Course:  Vital Signs reviewed on presentation, significant for 97.7, heart rate 75, blood pressure 169/70, saturation 98% on 2 L nasal cannula. Labs reviewed, significant for sodium 140, potassium 4.6, BUN 96, creatinine 3.8, LFTs within normal limits, troponin 97 with repeat 148.  WBC count 10.2, hemoglobin 11.7, hematocrit 38, platelets 248.  D-dimer 7.78.  TSH 4.9.  SARS Covid RT-PCR is negative. Imaging personally Reviewed, chest x-ray shows stable exam, cardiomegaly, pacemaker in place, central vascular congestion with no evidence of effusion, airspace disease. EKG personally reviewed, shows ventricular paced rhythm.    Review of systems:    Review of Systems: As per HPI otherwise 10 point review of systems negative.  All other review of systems is negative except the ones noted above in the HPI.    Past Medical and Surgical History:  Reviewed by me  Past Medical History:  Diagnosis Date  . Acute renal insufficiency 10/15/2013  . Arthritis   . Cancer [facility]) 2008   right breast  . Cancer of breast- s/p chemo and Rt mastectomy 2008 10/13/2013  . Cardiomyopathy ([place])    dating back to 2010  . CHB (complete heart block) ([place]) 10/13/2013  . Chronic combined systolic and diastolic congestive heart failure ([place])   . Diabetes mellitus    with neuropathy  . Diabetes mellitus type 2, controlled, without complications ([place]) 8/84/1660  . Dyslipidemia 10/13/2013  . History of blood transfusion   .  Hypercholesterolemia   . Hypertension   . Mitral stenosis- moderate with trivial MR  10/15/2013  . Pacemaker 10/23/2014  . Presence of permanent cardiac pacemaker   . Primary osteoarthritis of left knee 11/04/2015  . Syncope 10/13/2013  . Tingling    feet bilat     Past Surgical History:  Procedure Laterality Date  . APPENDECTOMY     mmh  . BI-VENTRICULAR PACEMAKER INSERTION N/A 10/15/2013   Procedure: BI-VENTRICULAR PACEMAKER INSERTION (CRT-P);  Surgeon: Evans Lance, MD;  Location: [facility] CATH LAB;  Service: Cardiovascular;  Laterality: N/A;  . Cardiomyopathy    . CATARACT EXTRACTION W/PHACO  06/29/2011   Procedure: CATARACT EXTRACTION PHACO AND INTRAOCULAR LENS PLACEMENT (IOC);  Surgeon: Tonny Branch;  Location: [facility] ORS;  Service: Ophthalmology;  Laterality: Right;  CDE: 14.41  . CATARACT EXTRACTION W/PHACO  09/04/2011   Procedure: CATARACT EXTRACTION PHACO AND INTRAOCULAR LENS PLACEMENT (IOC);  Surgeon: Tonny Branch;  Location: [facility] ORS;  Service: Ophthalmology;  Laterality: Left;  CDE: 15.21  . CHOLECYSTECTOMY     mmh  . KNEE ARTHROSCOPY     right-mmh  . MASTECTOMY  2008    mmh-right  . TEMPORARY PACEMAKER INSERTION N/A 10/13/2013   Procedure: TEMPORARY PACEMAKER INSERTION;  Surgeon: Jolaine Artist, MD;  Location: [facility] CATH LAB;  Service: Cardiovascular;  Laterality: N/A;  . TOTAL KNEE  ARTHROPLASTY Left 11/04/2015   Procedure: LEFT TOTAL KNEE ARTHROPLASTY;  Surgeon: Rod Can, MD;  Location: [facility] ORS;  Service: Orthopedics;  Laterality: Left;     Social History:  Reviewed by me   reports that she has never smoked. She has never used smokeless tobacco. She reports that she does not drink alcohol or use drugs.  Allergies:    No Known Allergies  Family History :   Family History  Problem Relation Age of Onset  . Hypertension Father   . Diabetes Father   . Anesthesia problems Neg Hx   . Hypotension Neg Hx   . Malignant hyperthermia Neg Hx   . Pseudochol deficiency Neg Hx     Family history reviewed, noted as above, not pertinent to current presentation.   Home Medications:    Prior to Admission medications   Medication Sig Start Date End Date Taking? Authorizing Provider  aspirin 81 MG chewable tablet Chew 243 mg by mouth once.    Yes [provider]  aspirin EC 81 MG tablet Take 81 mg by mouth daily.   Yes [provider]  carvedilol (COREG) 6.25 MG tablet Take 0.5 tablets (3.125 mg total) by mouth 2 (two) times daily. Patient taking differently: Take 6.25 mg by mouth 2 (two) times daily with a meal.  10/16/13  Yes Edmisten, Brooke O, PA-C  chlorthalidone (HYGROTON) 25 MG tablet Take 25 mg by mouth daily. 10/03/19  Yes [provider]  Cholecalciferol (VITAMIN D) 2000 UNITS tablet Take 2,000 Units by mouth daily.    Yes [provider]  furosemide (LASIX) 40 MG tablet TAKE 1 TABLET EVERY DAY FOR 5 DAYS, THEN DAILY AS NEEDED FOR WEIGHT GAIN OF 3 POUNDS IN 24 HOURS, OR 5 POUNDS IN 1 WEEK Patient taking differently: Take 60 mg by mouth in the morning.  12/05/19  Yes Herminio Commons, MD  glipiZIDE (GLUCOTROL XL) 5 MG 24 hr tablet Take 5 mg by mouth in the morning and at bedtime.    Yes [provider]  hydrALAZINE (APRESOLINE) 50 MG tablet TAKE 1 TABLET (50 MG TOTAL) BY MOUTH 3 (THREE) TIMES DAILY. Patient taking differently: Take 50 mg by mouth in the morning and at bedtime.  10/30/19  Yes Herminio Commons, MD  losartan (COZAAR) 100 MG tablet Take 100 mg by mouth daily.  10/15/19  Yes [provider]  potassium chloride (KLOR-CON) 10 MEQ tablet TAKE 1 TABLET EVERY DAY AS NEEDED  Patient taking differently: Take 10 mEq by mouth in the morning.  11/12/19  Yes Herminio Commons, MD  pravastatin (PRAVACHOL) 10 MG tablet Take 10 mg by mouth at bedtime.    Yes [provider]  vitamin B-12 (CYANOCOBALAMIN) 1000 MCG tablet Take 1,000 mcg by mouth daily.   Yes [provider]    Physical Exam:     Physical Exam: Vitals:   12/21/19 2045 12/21/19 2100 12/21/19 2130 12/21/19 2230  BP:  (!) 165/78 124/68 (!) 164/69  Pulse: 77 73 76 75  Resp: (!) 25 17 17 17   Temp:      TempSrc:      SpO2: 96% 94% 98% 95%  Weight:      Height:        Constitutional: NAD, calm, comfortable Vitals:   12/21/19 2045 12/21/19 2100 12/21/19 2130 12/21/19 2230  BP:  (!) 165/78 124/68 (!) 164/69  Pulse: 77 73 76 75  Resp: (!) 25 17 17 17   Temp:  TempSrc:      SpO2: 96% 94% 98% 95%  Weight:      Height:       Eyes: PERRL, lids and conjunctivae normal ENMT: Mucous membranes are moist. Posterior pharynx clear of any exudate or lesions.Normal dentition.  Neck: normal, supple, no masses, no thyromegaly Respiratory: clear to auscultation bilaterally, no wheezing, no crackles. Normal respiratory effort. No accessory muscle use.  Cardiovascular: Regular rate and rhythm, no murmurs / rubs / gallops. No extremity edema. 2+ pedal pulses. No carotid bruits. Pacemaker in left chest wall Abdomen: no tenderness, no masses palpated. No hepatosplenomegaly. Bowel sounds positive.  Musculoskeletal: no clubbing / cyanosis. No joint deformity upper and lower extremities. Good ROM, no contractures. Normal muscle tone.  Skin: no rashes, lesions, ulcers. No induration Neurologic: CN 2-12 grossly intact. Sensation intact, DTR normal. Strength 5/5 in all 4.  Psychiatric: Normal judgment and insight. Alert and oriented x 3. Normal mood.    Decubitus Ulcers: Not present on admission Catheters and tubes: None  Data Review:    Labs on Admission: I have personally reviewed following labs and imaging studies  CBC: Recent Labs  Lab 12/21/19 1606  WBC 10.1  NEUTROABS 7.3  HGB 11.9*  HCT 38.4  MCV 93.2  PLT 124   Basic Metabolic Panel: Recent Labs  Lab 12/19/19 0951 12/21/19 1606  NA 140 140  K 4.5 4.6  CL 109 111  CO2 21* 20*  GLUCOSE 189* 209*  BUN 80* 96*  CREATININE 3.37* 3.88*  CALCIUM 9.0  8.8*   GFR: Estimated Creatinine Clearance: 14.1 mL/min (A) (by C-G formula based on SCr of 3.88 mg/dL (H)). Liver Function Tests: Recent Labs  Lab 12/21/19 1606  AST 17  ALT 17  ALKPHOS 63  BILITOT 0.3  PROT 6.7  ALBUMIN 3.2*   No results for input(s): LIPASE, AMYLASE in the last 168 hours. No results for input(s): AMMONIA in the last 168 hours. Coagulation Profile: No results for input(s): INR, PROTIME in the last 168 hours. Cardiac Enzymes: No results for input(s): CKTOTAL, CKMB, CKMBINDEX, TROPONINI in the last 168 hours. BNP (last 3 results) No results for input(s): PROBNP in the last 8760 hours. HbA1C: No results for input(s): HGBA1C in the last 72 hours. CBG: No results for input(s): GLUCAP in the last 168 hours. Lipid Profile: No results for input(s): CHOL, HDL, LDLCALC, TRIG, CHOLHDL, LDLDIRECT in the last 72 hours. Thyroid Function Tests: Recent Labs    12/21/19 1606  TSH 4.918*   Anemia Panel: No results for input(s): VITAMINB12, FOLATE, FERRITIN, TIBC, IRON, RETICCTPCT in the last 72 hours. Urine analysis:    Component Value Date/Time   COLORURINE YELLOW 12/14/2015 2300   APPEARANCEUR CLEAR 12/14/2015 2300   LABSPEC 1.020 12/14/2015 2300   PHURINE 5.0 12/14/2015 2300   GLUCOSEU 100 (A) 12/14/2015 2300   HGBUR NEGATIVE 12/14/2015 2300   BILIRUBINUR SMALL (A) 12/14/2015 2300   KETONESUR TRACE (A) 12/14/2015 2300   PROTEINUR 100 (A) 12/14/2015 2300   UROBILINOGEN 0.2 10/13/2013 2318   NITRITE NEGATIVE 12/14/2015 2300   LEUKOCYTESUR TRACE (A) 12/14/2015 2300     Imaging Results:      Radiological Exams on Admission: DG Chest Port 1 View  Result Date: 12/21/2019 CLINICAL DATA:  Chest pain, shortness of breath, history of pacemaker EXAM: PORTABLE CHEST 1 VIEW COMPARISON:  11/19/2019 FINDINGS: Single frontal view of the chest demonstrates stable multi lead pacemaker and enlarged cardiac silhouette. Stable central vascular congestion without airspace  disease, effusion, or  pneumothorax. IMPRESSION: 1. Stable exam, no acute process. Electronically Signed   By: Randa Ngo M.D.   On: 12/21/2019 16:14      Zeven Kocak Ginette Otto MD [facility]  If 7PM-7AM, please contact night-coverage   12/21/2019, 11:35 PM

## 2019-12-21 NOTE — ED Triage Notes (Addendum)
Ems reports pt c/o sob and chest pain today.  Reports has been having this cp off and on since Nov.  Reports pain worse today.  EMS says bp was 80/60 when they arrived.  CBG 267.  EMS started 20g iv in left hand and started fluids.  Pt took 324mg  asa prior to ems arrival.    EMS says was told her room air o2 sat was in 70's.  o2 sat increased to 96% on 2liters.

## 2019-12-22 ENCOUNTER — Observation Stay (HOSPITAL_COMMUNITY): Payer: Medicare HMO

## 2019-12-22 ENCOUNTER — Encounter (HOSPITAL_COMMUNITY): Payer: Self-pay | Admitting: Internal Medicine

## 2019-12-22 DIAGNOSIS — I2699 Other pulmonary embolism without acute cor pulmonale: Secondary | ICD-10-CM | POA: Diagnosis present

## 2019-12-22 DIAGNOSIS — Z9841 Cataract extraction status, right eye: Secondary | ICD-10-CM | POA: Diagnosis not present

## 2019-12-22 DIAGNOSIS — E1122 Type 2 diabetes mellitus with diabetic chronic kidney disease: Secondary | ICD-10-CM | POA: Diagnosis present

## 2019-12-22 DIAGNOSIS — Z9221 Personal history of antineoplastic chemotherapy: Secondary | ICD-10-CM | POA: Diagnosis not present

## 2019-12-22 DIAGNOSIS — Z20822 Contact with and (suspected) exposure to covid-19: Secondary | ICD-10-CM | POA: Diagnosis present

## 2019-12-22 DIAGNOSIS — Z8249 Family history of ischemic heart disease and other diseases of the circulatory system: Secondary | ICD-10-CM | POA: Diagnosis not present

## 2019-12-22 DIAGNOSIS — I10 Essential (primary) hypertension: Secondary | ICD-10-CM | POA: Diagnosis not present

## 2019-12-22 DIAGNOSIS — Z9842 Cataract extraction status, left eye: Secondary | ICD-10-CM | POA: Diagnosis not present

## 2019-12-22 DIAGNOSIS — E785 Hyperlipidemia, unspecified: Secondary | ICD-10-CM | POA: Diagnosis present

## 2019-12-22 DIAGNOSIS — N184 Chronic kidney disease, stage 4 (severe): Secondary | ICD-10-CM | POA: Diagnosis present

## 2019-12-22 DIAGNOSIS — Z950 Presence of cardiac pacemaker: Secondary | ICD-10-CM

## 2019-12-22 DIAGNOSIS — R0600 Dyspnea, unspecified: Secondary | ICD-10-CM | POA: Diagnosis not present

## 2019-12-22 DIAGNOSIS — R0602 Shortness of breath: Secondary | ICD-10-CM | POA: Diagnosis present

## 2019-12-22 DIAGNOSIS — I1310 Hypertensive heart and chronic kidney disease without heart failure, with stage 1 through stage 4 chronic kidney disease, or unspecified chronic kidney disease: Secondary | ICD-10-CM | POA: Diagnosis present

## 2019-12-22 DIAGNOSIS — I429 Cardiomyopathy, unspecified: Secondary | ICD-10-CM | POA: Diagnosis present

## 2019-12-22 DIAGNOSIS — I050 Rheumatic mitral stenosis: Secondary | ICD-10-CM | POA: Diagnosis present

## 2019-12-22 DIAGNOSIS — Z961 Presence of intraocular lens: Secondary | ICD-10-CM | POA: Diagnosis present

## 2019-12-22 DIAGNOSIS — R7989 Other specified abnormal findings of blood chemistry: Secondary | ICD-10-CM | POA: Diagnosis not present

## 2019-12-22 DIAGNOSIS — I442 Atrioventricular block, complete: Secondary | ICD-10-CM | POA: Diagnosis present

## 2019-12-22 DIAGNOSIS — R071 Chest pain on breathing: Secondary | ICD-10-CM | POA: Diagnosis not present

## 2019-12-22 DIAGNOSIS — Z853 Personal history of malignant neoplasm of breast: Secondary | ICD-10-CM | POA: Diagnosis not present

## 2019-12-22 DIAGNOSIS — Z833 Family history of diabetes mellitus: Secondary | ICD-10-CM | POA: Diagnosis not present

## 2019-12-22 DIAGNOSIS — E119 Type 2 diabetes mellitus without complications: Secondary | ICD-10-CM | POA: Diagnosis not present

## 2019-12-22 DIAGNOSIS — I361 Nonrheumatic tricuspid (valve) insufficiency: Secondary | ICD-10-CM | POA: Diagnosis not present

## 2019-12-22 DIAGNOSIS — Z79899 Other long term (current) drug therapy: Secondary | ICD-10-CM | POA: Diagnosis not present

## 2019-12-22 DIAGNOSIS — Z96652 Presence of left artificial knee joint: Secondary | ICD-10-CM | POA: Diagnosis present

## 2019-12-22 DIAGNOSIS — E7800 Pure hypercholesterolemia, unspecified: Secondary | ICD-10-CM | POA: Diagnosis present

## 2019-12-22 DIAGNOSIS — M1712 Unilateral primary osteoarthritis, left knee: Secondary | ICD-10-CM | POA: Diagnosis present

## 2019-12-22 DIAGNOSIS — Z7982 Long term (current) use of aspirin: Secondary | ICD-10-CM | POA: Diagnosis not present

## 2019-12-22 DIAGNOSIS — Z9011 Acquired absence of right breast and nipple: Secondary | ICD-10-CM | POA: Diagnosis not present

## 2019-12-22 DIAGNOSIS — I5042 Chronic combined systolic (congestive) and diastolic (congestive) heart failure: Secondary | ICD-10-CM | POA: Diagnosis present

## 2019-12-22 DIAGNOSIS — I371 Nonrheumatic pulmonary valve insufficiency: Secondary | ICD-10-CM | POA: Diagnosis not present

## 2019-12-22 LAB — CBC
HCT: 38.3 % (ref 36.0–46.0)
HCT: 38.3 % (ref 36.0–46.0)
Hemoglobin: 11.7 g/dL — ABNORMAL LOW (ref 12.0–15.0)
Hemoglobin: 11.9 g/dL — ABNORMAL LOW (ref 12.0–15.0)
MCH: 28.6 pg (ref 26.0–34.0)
MCH: 28.7 pg (ref 26.0–34.0)
MCHC: 30.5 g/dL (ref 30.0–36.0)
MCHC: 31.1 g/dL (ref 30.0–36.0)
MCV: 93.6 fL (ref 80.0–100.0)
MCV: 92.3 fL (ref 80.0–100.0)
Platelets: 248 10*3/uL (ref 150–400)
Platelets: 240 10*3/uL (ref 150–400)
RBC: 4.09 MIL/uL (ref 3.87–5.11)
RBC: 4.15 MIL/uL (ref 3.87–5.11)
RDW: 14.8 % (ref 11.5–15.5)
RDW: 14.8 % (ref 11.5–15.5)
WBC: 10.2 10*3/uL (ref 4.0–10.5)
WBC: 9.7 10*3/uL (ref 4.0–10.5)
nRBC: 0 % (ref 0.0–0.2)
nRBC: 0 % (ref 0.0–0.2)

## 2019-12-22 LAB — BASIC METABOLIC PANEL
Anion gap: 12 (ref 5–15)
BUN: 90 mg/dL — ABNORMAL HIGH (ref 8–23)
CO2: 21 mmol/L — ABNORMAL LOW (ref 22–32)
Calcium: 9.2 mg/dL (ref 8.9–10.3)
Chloride: 113 mmol/L — ABNORMAL HIGH (ref 98–111)
Creatinine, Ser: 3.39 mg/dL — ABNORMAL HIGH (ref 0.44–1.00)
GFR calc Af Amer: 14 mL/min — ABNORMAL LOW (ref >60)
GFR calc non Af Amer: 12 mL/min — ABNORMAL LOW (ref >60)
Glucose, Bld: 74 mg/dL (ref 70–99)
Potassium: 3.9 mmol/L (ref 3.5–5.1)
Sodium: 146 mmol/L — ABNORMAL HIGH (ref 135–145)

## 2019-12-22 LAB — SARS CORONAVIRUS 2 (TAT 6-24 HRS): SARS Coronavirus 2: NEGATIVE

## 2019-12-22 LAB — GLUCOSE, CAPILLARY
Glucose-Capillary: 64 mg/dL — ABNORMAL LOW (ref 70–99)
Glucose-Capillary: 142 mg/dL — ABNORMAL HIGH (ref 70–99)
Glucose-Capillary: 132 mg/dL — ABNORMAL HIGH (ref 70–99)
Glucose-Capillary: 116 mg/dL — ABNORMAL HIGH (ref 70–99)
Glucose-Capillary: 168 mg/dL — ABNORMAL HIGH (ref 70–99)

## 2019-12-22 LAB — TROPONIN I (HIGH SENSITIVITY)
Troponin I (High Sensitivity): 148 ng/L (ref <18)
Troponin I (High Sensitivity): 151 ng/L (ref <18)
Troponin I (High Sensitivity): 132 ng/L (ref <18)
Troponin I (High Sensitivity): 128 ng/L (ref <18)

## 2019-12-22 LAB — HEPARIN LEVEL (UNFRACTIONATED)
Heparin Unfractionated: 1.06 IU/mL — ABNORMAL HIGH (ref 0.30–0.70)
Heparin Unfractionated: 1.16 IU/mL — ABNORMAL HIGH (ref 0.30–0.70)

## 2019-12-22 MED ORDER — INSULIN ASPART 100 UNIT/ML ~~LOC~~ SOLN
0.0000 [IU] | Freq: Three times a day (TID) | SUBCUTANEOUS | Status: DC
  Administered 2019-12-22 (×2): 2 [IU] via SUBCUTANEOUS

## 2019-12-22 MED ORDER — ONDANSETRON HCL 4 MG PO TABS: 4.0000 mg | ORAL_TABLET | Freq: Four times a day (QID) | ORAL | Status: DC | PRN

## 2019-12-22 MED ORDER — POTASSIUM CHLORIDE CRYS ER 10 MEQ PO TBCR
10.0000 meq | EXTENDED_RELEASE_TABLET | Freq: Every morning | ORAL | Status: DC
  Administered 2019-12-22 – 2019-12-23 (×2): 10 meq via ORAL
  Filled 2019-12-22 (×2): qty 1

## 2019-12-22 MED ORDER — PRAVASTATIN SODIUM 10 MG PO TABS
10.0000 mg | ORAL_TABLET | Freq: Every day | ORAL | Status: DC
  Administered 2019-12-22: 10 mg via ORAL
  Filled 2019-12-22: qty 1

## 2019-12-22 MED ORDER — ACETAMINOPHEN 325 MG PO TABS
650.0000 mg | ORAL_TABLET | Freq: Four times a day (QID) | ORAL | Status: DC | PRN
  Administered 2019-12-22: 650 mg via ORAL
  Filled 2019-12-22: qty 2

## 2019-12-22 MED ORDER — BISACODYL 10 MG RE SUPP: 10.0000 mg | Freq: Every day | RECTAL | Status: DC | PRN

## 2019-12-22 MED ORDER — VITAMIN D 25 MCG (1000 UNIT) PO TABS
2000.0000 [IU] | ORAL_TABLET | Freq: Every day | ORAL | Status: DC
  Administered 2019-12-22 – 2019-12-23 (×2): 2000 [IU] via ORAL
  Filled 2019-12-22 (×3): qty 2

## 2019-12-22 MED ORDER — HEPARIN (PORCINE) 25000 UT/250ML-% IV SOLN
1100.0000 [IU]/h | INTRAVENOUS | Status: DC
  Administered 2019-12-22: 1100 [IU]/h via INTRAVENOUS
  Filled 2019-12-22: qty 250

## 2019-12-22 MED ORDER — POLYETHYLENE GLYCOL 3350 17 G PO PACK: 17.0000 g | PACK | Freq: Every day | ORAL | Status: DC | PRN

## 2019-12-22 MED ORDER — HYDRALAZINE HCL 25 MG PO TABS
50.0000 mg | ORAL_TABLET | Freq: Two times a day (BID) | ORAL | Status: DC
  Administered 2019-12-22 – 2019-12-23 (×3): 50 mg via ORAL
  Filled 2019-12-22 (×3): qty 2

## 2019-12-22 MED ORDER — LOSARTAN POTASSIUM 50 MG PO TABS: 100.0000 mg | ORAL_TABLET | Freq: Every day | ORAL | Status: DC

## 2019-12-22 MED ORDER — HEPARIN BOLUS VIA INFUSION
2000.0000 [IU] | Freq: Once | INTRAVENOUS | Status: AC
  Administered 2019-12-22: 2000 [IU] via INTRAVENOUS

## 2019-12-22 MED ORDER — ACETAMINOPHEN 650 MG RE SUPP: 650.0000 mg | Freq: Four times a day (QID) | RECTAL | Status: DC | PRN

## 2019-12-22 MED ORDER — HEPARIN SODIUM (PORCINE) 5000 UNIT/ML IJ SOLN: 5000.0000 [IU] | Freq: Three times a day (TID) | INTRAMUSCULAR | Status: DC

## 2019-12-22 MED ORDER — ORAL CARE MOUTH RINSE
15.0000 mL | Freq: Two times a day (BID) | OROMUCOSAL | Status: DC
  Administered 2019-12-22 – 2019-12-23 (×2): 15 mL via OROMUCOSAL

## 2019-12-22 MED ORDER — FUROSEMIDE 40 MG PO TABS: 60.0000 mg | ORAL_TABLET | Freq: Every morning | ORAL | Status: DC

## 2019-12-22 MED ORDER — CARVEDILOL 3.125 MG PO TABS
3.1250 mg | ORAL_TABLET | Freq: Two times a day (BID) | ORAL | Status: DC
  Administered 2019-12-22 – 2019-12-23 (×3): 3.125 mg via ORAL
  Filled 2019-12-22 (×4): qty 1

## 2019-12-22 MED ORDER — SODIUM CHLORIDE 0.9% FLUSH: 3.0000 mL | INTRAVENOUS | Status: DC | PRN

## 2019-12-22 MED ORDER — FUROSEMIDE 40 MG PO TABS
40.0000 mg | ORAL_TABLET | Freq: Every morning | ORAL | Status: DC
  Administered 2019-12-22 – 2019-12-23 (×2): 40 mg via ORAL
  Filled 2019-12-22 (×2): qty 1

## 2019-12-22 MED ORDER — VITAMIN B-12 1000 MCG PO TABS
1000.0000 ug | ORAL_TABLET | Freq: Every day | ORAL | Status: DC
  Administered 2019-12-22 – 2019-12-23 (×2): 1000 ug via ORAL
  Filled 2019-12-22 (×3): qty 1

## 2019-12-22 MED ORDER — ASPIRIN EC 81 MG PO TBEC
81.0000 mg | DELAYED_RELEASE_TABLET | Freq: Every day | ORAL | Status: DC
  Administered 2019-12-22 – 2019-12-23 (×2): 81 mg via ORAL
  Filled 2019-12-22 (×2): qty 1

## 2019-12-22 MED ORDER — HEPARIN (PORCINE) 25000 UT/250ML-% IV SOLN
1250.0000 [IU]/h | INTRAVENOUS | Status: DC
  Administered 2019-12-22: 1250 [IU]/h via INTRAVENOUS
  Filled 2019-12-22: qty 250

## 2019-12-22 MED ORDER — SODIUM CHLORIDE 0.9 % IV SOLN: 250.0000 mL | INTRAVENOUS | Status: DC | PRN

## 2019-12-22 MED ORDER — HEPARIN (PORCINE) 25000 UT/250ML-% IV SOLN
750.0000 [IU]/h | INTRAVENOUS | Status: DC
  Administered 2019-12-23: 900 [IU]/h via INTRAVENOUS

## 2019-12-22 MED ORDER — ONDANSETRON HCL 4 MG/2ML IJ SOLN: 4.0000 mg | Freq: Four times a day (QID) | INTRAMUSCULAR | Status: DC | PRN

## 2019-12-22 MED ORDER — TRAMADOL HCL 50 MG PO TABS: 50.0000 mg | ORAL_TABLET | Freq: Three times a day (TID) | ORAL | Status: DC | PRN

## 2019-12-22 MED ORDER — CHLORTHALIDONE 25 MG PO TABS
25.0000 mg | ORAL_TABLET | Freq: Every day | ORAL | Status: DC
  Administered 2019-12-22 – 2019-12-23 (×2): 25 mg via ORAL
  Filled 2019-12-22 (×3): qty 1

## 2019-12-22 MED ORDER — SODIUM CHLORIDE 0.9% FLUSH
3.0000 mL | Freq: Two times a day (BID) | INTRAVENOUS | Status: DC
  Administered 2019-12-22 – 2019-12-23 (×3): 3 mL via INTRAVENOUS

## 2019-12-22 NOTE — Progress Notes (Signed)
[place] for IV heparin  Indication: pulmonary embolus  No Known Allergies  Patient Measurements: Height: 5\' 3"  (160 cm) Weight: 219 lb 9.3 oz (99.6 kg) IBW/kg (Calculated) : 52.4 Heparin Dosing Weight: 76 kg  Vital Signs: Temp: 97.7 F (36.5 C) (03/29 2135) Temp Source: Oral (03/29 2135) BP: 151/69 (03/29 2135) Pulse Rate: 78 (03/29 2135)  Labs: Recent Labs    12/21/19 1606 12/21/19 2354 12/22/19 0301 12/22/19 0613 12/22/19 0935 12/22/19 1948  HGB 11.9*  --  11.7* 11.9*  --   --   HCT 38.4  --  38.3 38.3  --   --   PLT 253  --  248 240  --   --   HEPARINUNFRC  --   --   --   --  1.06* 1.16*  CREATININE 3.88*  --   --  3.39*  --   --   TROPONINIHS 97*   < > 151* 132* 128*  --    < > = values in this interval not displayed.    Estimated Creatinine Clearance: 15.9 mL/min (A) (by C-G formula based on SCr of 3.39 mg/dL (H)).   Medical History: Past Medical History:  Diagnosis Date  . Acute renal insufficiency 10/15/2013  . Arthritis   . Cancer [facility]) 2008   right breast  . Cancer of breast- s/p chemo and Rt mastectomy 2008 10/13/2013  . Cardiomyopathy ([place])    dating back to 2010  . CHB (complete heart block) ([place]) 10/13/2013  . Chronic combined systolic and diastolic congestive heart failure ([place])   . Diabetes mellitus    with neuropathy  . Diabetes mellitus type 2, controlled, without complications ([place]) 11/04/4707  . Dyslipidemia 10/13/2013  . History of blood transfusion   . Hypercholesterolemia   . Hypertension   . Mitral stenosis- moderate with trivial MR  10/15/2013  . Pacemaker 10/23/2014  . Presence of permanent cardiac pacemaker   . Primary osteoarthritis of left knee 11/04/2015  . Syncope 10/13/2013  . Tingling    feet bilat     Assessment: 79 yo female presented to ED with CP found to have new PE to start IV heparin per pharmacy. Baseline labs drawn - note patient in acute renal failure. Not on anticoagulation  prior to admission  3/29 PM update:  Heparin level remains supra-therapeutic  No issues per RN  Goal of Therapy:  Heparin level 0.3-0.7 units/ml Monitor platelets by anticoagulation protocol: Yes   Plan:  -Hold heparin x 1 hr -Re-start heparin drip at 900 units/hr after holding -Re-check heparin level at 0800 -Monitor for bleeding  Narda Bonds, PharmD, BCPS Clinical Pharmacist Phone: [phone]

## 2019-12-22 NOTE — Plan of Care (Signed)
  Problem: Education: Goal: Ability to demonstrate management of disease process will improve 12/22/2019 0831 by Melony Overly, RN Outcome: Progressing 12/22/2019 0830 by Melony Overly, RN Outcome: Progressing Goal: Ability to verbalize understanding of medication therapies will improve 12/22/2019 0831 by Melony Overly, RN Outcome: Progressing 12/22/2019 0830 by Melony Overly, RN Outcome: Progressing Goal: Individualized Educational Video(s) 12/22/2019 0831 by Melony Overly, RN Outcome: Progressing 12/22/2019 0830 by Melony Overly, RN Outcome: Progressing   Problem: Activity: Goal: Capacity to carry out activities will improve 12/22/2019 0831 by Melony Overly, RN Outcome: Progressing 12/22/2019 0830 by Melony Overly, RN Outcome: Progressing   Problem: Cardiac: Goal: Ability to achieve and maintain adequate cardiopulmonary perfusion will improve 12/22/2019 0831 by Melony Overly, RN Outcome: Progressing 12/22/2019 0830 by Melony Overly, RN Outcome: Progressing   Problem: Education: Goal: Knowledge of General Education information will improve Description: Including pain rating scale, medication(s)/side effects and non-pharmacologic comfort measures Outcome: Progressing   Problem: Health Behavior/Discharge Planning: Goal: Ability to manage health-related needs will improve Outcome: Progressing   Problem: Clinical Measurements: Goal: Ability to maintain clinical measurements within normal limits will improve Outcome: Progressing Goal: Will remain free from infection Outcome: Progressing Goal: Diagnostic test results will improve Outcome: Progressing Goal: Respiratory complications will improve Outcome: Progressing Goal: Cardiovascular complication will be avoided Outcome: Progressing   Problem: Activity: Goal: Risk for activity intolerance will decrease Outcome: Progressing   Problem: Nutrition: Goal: Adequate nutrition will be maintained Outcome:  Progressing   Problem: Coping: Goal: Level of anxiety will decrease Outcome: Progressing   Problem: Elimination: Goal: Will not experience complications related to bowel motility Outcome: Progressing Goal: Will not experience complications related to urinary retention Outcome: Progressing   Problem: Pain Managment: Goal: General experience of comfort will improve Outcome: Progressing   Problem: Safety: Goal: Ability to remain free from injury will improve Outcome: Progressing   Problem: Skin Integrity: Goal: Risk for impaired skin integrity will decrease Outcome: Progressing

## 2019-12-22 NOTE — Progress Notes (Signed)
ANTICOAGULATION CONSULT NOTE -  Pharmacy Consult for IV heparin  Indication: pulmonary embolus  No Known Allergies  Patient Measurements: Height: 5\' 3"  (160 cm) Weight: 219 lb 9.3 oz (99.6 kg) IBW/kg (Calculated) : 52.4 Heparin Dosing Weight: 76 kg  Vital Signs: Temp: 98.6 F (37 C) (03/29 0817) Temp Source: Oral (03/29 0817) BP: 156/72 (03/29 0817) Pulse Rate: 81 (03/29 0817)  Labs: Recent Labs    12/21/19 1606 12/21/19 2354 12/22/19 0301 12/22/19 0613 12/22/19 0935  HGB 11.9*  --  11.7* 11.9*  --   HCT 38.4  --  38.3 38.3  --   PLT 253  --  248 240  --   HEPARINUNFRC  --   --   --   --  1.06*  CREATININE 3.88*  --   --  3.39*  --   TROPONINIHS 97*   < > 151* 132* 128*   < > = values in this interval not displayed.    Estimated Creatinine Clearance: 15.9 mL/min (A) (by C-G formula based on SCr of 3.39 mg/dL (H)).     Assessment: 79 yo female presented to ED with CP found to have new PE to start IV heparin per pharmacy. Baseline labs drawn - note patient in acute renal failure. Not on anticoagulation prior to admission  Goal of Therapy:  Heparin level 0.3-0.7 units/ml Monitor platelets by anticoagulation protocol: Yes   12/22/19 1100 UPDATE Heparin level: 1.06 IU/mL, above therapeutic goal range on heparin at 1250 units/hr Hb low stable,  CBC otherwise stable RN reports no issues with bleeding or infusion site problems   Plan:   Hold heparin infusion x1 hour  Re-start heparin infusion at 1215 today at a rate of 1100units/hr  Re-check heparin level ~8 hours after rate change  Monitor signs/symptoms of bleeding.  Despina Pole 12/22/2019,11:17 AM

## 2019-12-22 NOTE — Progress Notes (Signed)
Patient Demographics:    Cassandra Parker, is a 79 y.o. female, DOB - [birth year], YQI:[id number]  Admit date - 12/21/2019   Admitting Physician Viktorya Arguijo Denton Brick, MD  Outpatient Primary MD for the patient is [name], [name], MD  LOS - 0   Chief Complaint  Patient presents with  . Chest Pain        Subjective:    [name] today has no fevers, no emesis,  No chest pain,   -Son and daughter at bedside, questions answered -No further chest pains -Diarrhea persist -On IV heparin without any bleeding concerns at this time  Assessment  & Plan :    Principal Problem:   Pulmonary embolism--- VQ scan with intermediate probability Active Problems:   Cardiomyopathy- EF 35-40% (etiology not yet determined)   CHB (complete heart block) (HCC)   HTN (hypertension)   Diabetes mellitus type 2, controlled, without complications (Cassandra Parker)   Cancer of breast- s/p chemo and Rt mastectomy 2008   Dyslipidemia   Mitral stenosis- moderate with trivial MR    Pacemaker   Primary osteoarthritis of left knee   Chest pain  A/p 1) chest pains ACS versus PE----continue IV heparin,  echocardiogram pending -Severely elevated D-dimer noted -Borderline elevated troponin noted-peaking at 151  2) possible pulmonary embolism--- VQ scan is intermediate probability for PE, unable to do CTA chest due to renal function --Lower extremity venous Dopplers without acute DVT -Risk versus benefits of anticoagulation discussed with patient and her son and her daughter --Await echocardiogram to see if she has any right heart strain /elevated pulmonary pressures -Patient and family to decide on decision on anticoagulation  3)HFpEF--patient with chronic diastolic dysfunction CHF with EF of 60 to 65% based on echo from 04/14/2019  4)AKI----acute kidney injury on CKD stage - IV  --- worsening renal function due to dehydration in setting  of diarrhea compounded by Lasix and losartan use  ---creatinine on admission= 3.37 , -- baseline creatinine = 2.6 (06/2019) --- creatinine is now=3.39 (peak this admission 3.88)  , renally adjust medications, avoid nephrotoxic agents / dehydration  / hypotension -Losartan on hold -Diuretics adjusted  5)DM2-no recent A1c, hold glipizide, Use Novolog/Humalog Sliding scale insulin with Accu-Cheks/Fingersticks as ordered   6) history of breast cancer--- status post prior right-sided mastectomy and chemotherapy--stable  7) chronic mild normocytic and normochromic anemia--- suspect due to underlying CKD--no evidence of ongoing bleeding  8)h/o CHB--patient is status post PPM--stable  Disposition/Need for in-Hospital Stay- patient unable to be discharged at this time due to --requiring -  IV heparin due to concerns about PE versus ACS -Not medically ready for discharge at this time pending echo and repeat troponins  Code Status : full  Family Communication:   (patient is alert, awake and coherent) --Discussed with son and daughter at bedside  Consults  : She may need cardiology and nephrology input  DVT Prophylaxis  :  Iv Heparin  Lab Results  Component Value Date   PLT 240 12/22/2019    Inpatient Medications  Scheduled Meds: . aspirin EC  81 mg Oral Daily  . carvedilol  3.125 mg Oral BID  . chlorthalidone  25 mg Oral Daily  . cholecalciferol  2,000 Units Oral Daily  . furosemide  40  mg Oral q AM  . hydrALAZINE  50 mg Oral BID  . insulin aspart  0-15 Units Subcutaneous TID WC  . mouth rinse  15 mL Mouth Rinse BID  . potassium chloride  10 mEq Oral q AM  . pravastatin  10 mg Oral QHS  . sodium chloride flush  3 mL Intravenous Q12H  . vitamin B-12  1,000 mcg Oral Daily   Continuous Infusions: . sodium chloride    . heparin 1,100 Units/hr (12/22/19 1228)   PRN Meds:.sodium chloride, acetaminophen **OR** acetaminophen, bisacodyl, ondansetron **OR** ondansetron (ZOFRAN) IV,  polyethylene glycol, sodium chloride flush, traMADol    Anti-infectives (From admission, onward)   None        Objective:   Vitals:   12/22/19 0456 12/22/19 0700 12/22/19 0817 12/22/19 0932  BP:  (!) 161/70 (!) 156/72   Pulse:   81   Resp:      Temp:   98.6 F (37 C)   TempSrc:   Oral   SpO2:   96% 94%  Weight: 99.6 kg     Height:        Wt Readings from Last 3 Encounters:  12/22/19 99.6 kg  12/18/19 102.2 kg  07/26/19 105.7 kg     Intake/Output Summary (Last 24 hours) at 12/22/2019 1800 Last data filed at 12/22/2019 1500 Gross per 24 hour  Intake 513.87 ml  Output 600 ml  Net -86.13 ml     Physical Exam  Gen:- Awake Alert,  In no apparent distress  HEENT:- [place].AT, No sclera icterus Neck-Supple Neck,No JVD,.  Lungs-diminished breath sounds, no wheezing  CV- S1, S2 normal, regular , pacemaker in situ Abd-  +ve B.Sounds, Abd Soft, No tenderness,    Extremity/Skin:- No  edema, pedal pulses present  Psych-affect is appropriate, oriented x3 Neuro-generalized weakness, no new focal deficits, no tremors   Data Review:   Micro Results Recent Results (from the past 240 hour(s))  SARS CORONAVIRUS 2 (TAT 6-24 HRS) Nasopharyngeal Nasopharyngeal Swab     Status: None   Collection Time: 12/21/19  3:48 PM   Specimen: Nasopharyngeal Swab  Result Value Ref Range Status   SARS Coronavirus 2 NEGATIVE NEGATIVE Final    Comment: (NOTE) SARS-CoV-2 target nucleic acids are NOT DETECTED. The SARS-CoV-2 RNA is generally detectable in upper and lower respiratory specimens during the acute phase of infection. Negative results do not preclude SARS-CoV-2 infection, do not rule out co-infections with other pathogens, and should not be used as the sole basis for treatment or other patient management decisions. Negative results must be combined with clinical observations, patient history, and epidemiological information. The expected result is Negative. Fact Sheet for Patients:  SugarRoll.be Fact Sheet for Healthcare Providers: https://www.woods-mathews.com/ This test is not yet approved or cleared by the [place] FDA and  has been authorized for detection and/or diagnosis of SARS-CoV-2 by FDA under an Emergency Use Authorization (EUA). This EUA will remain  in effect (meaning this test can be used) for the duration of the COVID-19 declaration under Section 56 4(b)(1) of the Act, 21 U.S.C. section 360bbb-3(b)(1), unless the authorization is terminated or revoked sooner. Performed at [facility], [place] [address], [place], [place] [address]     Radiology Reports NM Pulmonary Perfusion  Addendum Date: 12/22/2019   ADDENDUM REPORT: 12/22/2019 13:10 ADDENDUM: Patient returned for ventilation exam utilizing 44 millicuries of BO-17P DTPA aerosol. Images were obtained in 8 projections. Comparison is made to the preceding perfusion study, and chest radiograph  of 12/21/2019. Worse ventilation than perfusion in LEFT lower lobe. Ventilatory defects are identified within the RIGHT middle and RIGHT lower lobes, though slightly smaller than the accompanying perfusion abnormalities especially on RPO and RAO views. Chest radiograph clear at the RIGHT lung base. Findings represent an intermediate probability for pulmonary embolism. Electronically Signed   By: Lavonia Dana M.D.   On: 12/22/2019 13:10   Result Date: 12/22/2019 CLINICAL DATA:  Shortness of breath, elevated D-dimer, elevated creatinine EXAM: NUCLEAR MEDICINE PERFUSION LUNG SCAN TECHNIQUE: Perfusion images were obtained in multiple projections after intravenous injection of radiopharmaceutical. Ventilation scans intentionally deferred if perfusion scan and chest x-ray adequate for interpretation during COVID 19 epidemic. RADIOPHARMACEUTICALS:  1.6 mCi Tc-33m MAA IV COMPARISON:  Correlation with chest radiograph dated 12/21/2019 FINDINGS: Single large perfusion defect in the  right middle lobe. Possible small defects in the posterior right lower lobe and anterior left lung apex, although equivocal. Corresponding chest radiograph is clear, including specifically in these regions. Ventilation imaging not performed. IMPRESSION: Findings suspicious for pulmonary embolism, as described above. These results were called by telephone at the time of interpretation on 12/22/2019 at 12:10 am to provider Dr Reather Converse, who verbally acknowledged these results. Electronically Signed: By: Julian Hy M.D. On: 12/22/2019 00:12   US Venous Img Lower Bilateral (DVT)  Result Date: 12/22/2019 CLINICAL DATA:  Pulmonary embolism.  Elevated D-dimer. EXAM: BILATERAL LOWER EXTREMITY VENOUS DOPPLER ULTRASOUND TECHNIQUE: Gray-scale sonography with compression, as well as color and duplex ultrasound, were performed to evaluate the deep venous systems from the level of the common femoral vein through the popliteal and proximal calf veins. COMPARISON:  None. FINDINGS: VENOUS Normal compressibility of the common femoral, superficial femoral, and popliteal veins, as well as the visualized calf veins. Visualized portions of profunda femoral vein and great saphenous vein unremarkable. No filling defects to suggest DVT on grayscale or color Doppler imaging. Doppler waveforms show normal direction of venous flow, normal respiratory phasicity and response to augmentation. Limited views of the contralateral common femoral vein are unremarkable. OTHER None. Limitations: none IMPRESSION: No femoropopliteal DVT nor evidence of DVT within the visualized calf veins OF BOTH LOWER EXTREMITIES. If clinical symptoms are inconsistent or if there are persistent or worsening symptoms, further imaging (possibly involving the iliac veins) may be warranted. Electronically Signed   By: Lorriane Shire M.D.   On: 12/22/2019 10:34   DG Chest Port 1 View  Result Date: 12/21/2019 CLINICAL DATA:  Chest pain, shortness of breath, history  of pacemaker EXAM: PORTABLE CHEST 1 VIEW COMPARISON:  11/19/2019 FINDINGS: Single frontal view of the chest demonstrates stable multi lead pacemaker and enlarged cardiac silhouette. Stable central vascular congestion without airspace disease, effusion, or pneumothorax. IMPRESSION: 1. Stable exam, no acute process. Electronically Signed   By: Randa Ngo M.D.   On: 12/21/2019 16:14     CBC Recent Labs  Lab 12/21/19 1606 12/22/19 0301 12/22/19 0613  WBC 10.1 10.2 9.7  HGB 11.9* 11.7* 11.9*  HCT 38.4 38.3 38.3  PLT 253 248 240  MCV 93.2 93.6 92.3  MCH 28.9 28.6 28.7  MCHC 31.0 30.5 31.1  RDW 14.7 14.8 14.8  LYMPHSABS 2.1  --   --   MONOABS 0.6  --   --   EOSABS 0.1  --   --   BASOSABS 0.1  --   --     Chemistries  Recent Labs  Lab 12/19/19 0951 12/21/19 1606 12/22/19 0613  NA 140 140 146*  K 4.5  4.6 3.9  CL 109 111 113*  CO2 21* 20* 21*  GLUCOSE 189* 209* 74  BUN 80* 96* 90*  CREATININE 3.37* 3.88* 3.39*  CALCIUM 9.0 8.8* 9.2  AST  --  17  --   ALT  --  17  --   ALKPHOS  --  63  --   BILITOT  --  0.3  --    ------------------------------------------------------------------------------------------------------------------ No results for input(s): CHOL, HDL, LDLCALC, TRIG, CHOLHDL, LDLDIRECT in the last 72 hours.  Lab Results  Component Value Date   HGBA1C 6.6 (H) 10/13/2013   ------------------------------------------------------------------------------------------------------------------ Recent Labs    12/21/19 1606  TSH 4.918*   ------------------------------------------------------------------------------------------------------------------ No results for input(s): VITAMINB12, FOLATE, FERRITIN, TIBC, IRON, RETICCTPCT in the last 72 hours.  Coagulation profile No results for input(s): INR, PROTIME in the last 168 hours.  Recent Labs    12/21/19 1606  DDIMER 7.78*    Cardiac Enzymes No results for input(s): CKMB, TROPONINI, MYOGLOBIN in the last 168  hours.  Invalid input(s): CK ------------------------------------------------------------------------------------------------------------------ No results found for: BNP   Roxan Hockey M.D on 12/22/2019 at 6:00 PM  Go to [email or web address] - for contact info  [facility] - Office  [phone]

## 2019-12-22 NOTE — ED Provider Notes (Signed)
Patient CARE signed out to continue to monitor and follow-up until admission.  Patient presented with chest pain concerning for ACS versus pulmonary embolism.  Patient requiring 2 L nasal cannula.  D-dimer significantly elevated, troponin mild elevated.  Unable to get complete V/Q scan performed however initial portion is suspicious for pulmonary embolism per discussion with radiologist.  Angus Seller of the study will be performed later this morning.  The hospitalist was consulted for admission.  Heparin per pharmacy has been ordered.  CRITICAL CARE Performed by: Mariea Clonts   Total critical care time: 35 minutes  Critical care time was exclusive of separately billable procedures and treating other patients.  Critical care was necessary to treat or prevent imminent or life-threatening deterioration.  Critical care was time spent personally by me on the following activities: development of treatment plan with patient and/or surrogate as well as nursing, discussions with consultants, evaluation of patient's response to treatment, examination of patient, obtaining history from patient or surrogate, ordering and performing treatments and interventions, ordering and review of laboratory studies, ordering and review of radiographic studies, pulse oximetry and re-evaluation of patient's condition.     Elnora Morrison, MD 12/22/19 [phone]

## 2019-12-22 NOTE — Progress Notes (Signed)
ANTICOAGULATION CONSULT NOTE - Initial Consult  Pharmacy Consult for IV heparin  Indication: pulmonary embolus  No Known Allergies  Patient Measurements: Height: 5\' 3"  (160 cm) Weight: 225 lb (102.1 kg) IBW/kg (Calculated) : 52.4 Heparin Dosing Weight: 76 kg  Vital Signs: Temp: 98.2 F (36.8 C) (03/28 1504) Temp Source: Oral (03/28 1504) BP: 171/65 (03/29 0000) Pulse Rate: 72 (03/29 0000)  Labs: Recent Labs    12/19/19 0951 12/21/19 1606  HGB  --  11.9*  HCT  --  38.4  PLT  --  253  CREATININE 3.37* 3.88*  TROPONINIHS  --  97*    Estimated Creatinine Clearance: 14.1 mL/min (A) (by C-G formula based on SCr of 3.88 mg/dL (H)).   Medical History: Past Medical History:  Diagnosis Date  . Acute renal insufficiency 10/15/2013  . Arthritis   . Cancer [facility]) 2008   right breast  . Cancer of breast- s/p chemo and Rt mastectomy 2008 10/13/2013  . Cardiomyopathy ([place])    dating back to 2010  . CHB (complete heart block) ([place]) 10/13/2013  . Chronic combined systolic and diastolic congestive heart failure ([place])   . Diabetes mellitus    with neuropathy  . Diabetes mellitus type 2, controlled, without complications ([place]) 10/25/4386  . Dyslipidemia 10/13/2013  . History of blood transfusion   . Hypercholesterolemia   . Hypertension   . Mitral stenosis- moderate with trivial MR  10/15/2013  . Pacemaker 10/23/2014  . Presence of permanent cardiac pacemaker   . Primary osteoarthritis of left knee 11/04/2015  . Syncope 10/13/2013  . Tingling    feet bilat     Medications:  Scheduled:  Infusions:   Assessment: 79 yo female presented to ED with CP found to have new PE to start IV heparin per pharmacy. Baseline labs drawn - note patient in acute renal failure. Not on anticoagulation prior to admission  Goal of Therapy:  Heparin level 0.3-0.7 units/ml Monitor platelets by anticoagulation protocol: Yes   Plan:   IV heparin 2000 unit bolus then  IV heparin infusion rate of  1250 units/hr  Check heparin level 8 hours after start of IV heparin  Daily CBC  Kara Mead 12/22/2019,12:15 AM

## 2019-12-22 NOTE — ED Notes (Signed)
Date and time results received: 12/22/19 0112   Test: Troponin Critical Value: 148  Name of Provider Notified: Scherrie November, MD  Orders Received? Or Actions Taken?: acknowledged

## 2019-12-23 ENCOUNTER — Ambulatory Visit (INDEPENDENT_AMBULATORY_CARE_PROVIDER_SITE_OTHER): Payer: Medicare HMO | Admitting: *Deleted

## 2019-12-23 ENCOUNTER — Inpatient Hospital Stay (HOSPITAL_COMMUNITY): Payer: Medicare HMO

## 2019-12-23 DIAGNOSIS — I442 Atrioventricular block, complete: Secondary | ICD-10-CM

## 2019-12-23 DIAGNOSIS — E119 Type 2 diabetes mellitus without complications: Secondary | ICD-10-CM

## 2019-12-23 DIAGNOSIS — I361 Nonrheumatic tricuspid (valve) insufficiency: Secondary | ICD-10-CM

## 2019-12-23 DIAGNOSIS — I371 Nonrheumatic pulmonary valve insufficiency: Secondary | ICD-10-CM

## 2019-12-23 LAB — CBC
HCT: 37.5 % (ref 36.0–46.0)
Hemoglobin: 11.5 g/dL — ABNORMAL LOW (ref 12.0–15.0)
MCH: 28.4 pg (ref 26.0–34.0)
MCHC: 30.7 g/dL (ref 30.0–36.0)
MCV: 92.6 fL (ref 80.0–100.0)
Platelets: 235 10*3/uL (ref 150–400)
RBC: 4.05 MIL/uL (ref 3.87–5.11)
RDW: 14.6 % (ref 11.5–15.5)
WBC: 9.7 10*3/uL (ref 4.0–10.5)
nRBC: 0 % (ref 0.0–0.2)

## 2019-12-23 LAB — ECHOCARDIOGRAM COMPLETE
Height: 63 in
Weight: 3569.69 oz

## 2019-12-23 LAB — BASIC METABOLIC PANEL
Anion gap: 9 (ref 5–15)
BUN: 89 mg/dL — ABNORMAL HIGH (ref 8–23)
CO2: 20 mmol/L — ABNORMAL LOW (ref 22–32)
Calcium: 8.7 mg/dL — ABNORMAL LOW (ref 8.9–10.3)
Chloride: 110 mmol/L (ref 98–111)
Creatinine, Ser: 3.33 mg/dL — ABNORMAL HIGH (ref 0.44–1.00)
GFR calc Af Amer: 15 mL/min — ABNORMAL LOW (ref >60)
GFR calc non Af Amer: 13 mL/min — ABNORMAL LOW (ref >60)
Glucose, Bld: 132 mg/dL — ABNORMAL HIGH (ref 70–99)
Potassium: 4 mmol/L (ref 3.5–5.1)
Sodium: 139 mmol/L (ref 135–145)

## 2019-12-23 LAB — GLUCOSE, CAPILLARY
Glucose-Capillary: 142 mg/dL — ABNORMAL HIGH (ref 70–99)
Glucose-Capillary: 185 mg/dL — ABNORMAL HIGH (ref 70–99)

## 2019-12-23 LAB — TROPONIN I (HIGH SENSITIVITY): Troponin I (High Sensitivity): 105 ng/L (ref <18)

## 2019-12-23 LAB — HEPARIN LEVEL (UNFRACTIONATED): Heparin Unfractionated: 0.8 IU/mL — ABNORMAL HIGH (ref 0.30–0.70)

## 2019-12-23 MED ORDER — HYDRALAZINE HCL 50 MG PO TABS: 50.0000 mg | ORAL_TABLET | Freq: Two times a day (BID) | ORAL | 5 refills | Status: DC

## 2019-12-23 MED ORDER — ELIQUIS DVT/PE STARTER PACK 5 MG PO TBPK: ORAL_TABLET | ORAL | 0 refills | Status: DC

## 2019-12-23 MED ORDER — APIXABAN 5 MG PO TABS: 5.0000 mg | ORAL_TABLET | Freq: Two times a day (BID) | ORAL | Status: DC

## 2019-12-23 MED ORDER — AMLODIPINE BESYLATE 5 MG PO TABS: 5.0000 mg | ORAL_TABLET | Freq: Every day | ORAL | 11 refills | Status: DC

## 2019-12-23 MED ORDER — APIXABAN 5 MG PO TABS: 5.0000 mg | ORAL_TABLET | Freq: Two times a day (BID) | ORAL | 4 refills | Status: DC

## 2019-12-23 MED ORDER — CARVEDILOL 3.125 MG PO TABS: 3.1250 mg | ORAL_TABLET | Freq: Two times a day (BID) | ORAL | 2 refills | Status: DC

## 2019-12-23 MED ORDER — ACETAMINOPHEN 325 MG PO TABS: 650.0000 mg | ORAL_TABLET | Freq: Four times a day (QID) | ORAL | 2 refills | Status: AC | PRN

## 2019-12-23 MED ORDER — FUROSEMIDE 40 MG PO TABS: 40.0000 mg | ORAL_TABLET | Freq: Every morning | ORAL | 3 refills | Status: DC

## 2019-12-23 MED ORDER — POTASSIUM CHLORIDE CRYS ER 10 MEQ PO TBCR: 10.0000 meq | EXTENDED_RELEASE_TABLET | Freq: Every morning | ORAL | 2 refills | Status: DC

## 2019-12-23 MED ORDER — APIXABAN 5 MG PO TABS
10.0000 mg | ORAL_TABLET | Freq: Two times a day (BID) | ORAL | Status: DC
  Administered 2019-12-23: 10 mg via ORAL
  Filled 2019-12-23: qty 2

## 2019-12-23 MED ORDER — LOSARTAN POTASSIUM 50 MG PO TABS: 50.0000 mg | ORAL_TABLET | Freq: Every day | ORAL | 4 refills | Status: DC

## 2019-12-23 NOTE — Progress Notes (Signed)
[place] for IV heparin >> apixaban Indication: pulmonary embolus  No Known Allergies  Patient Measurements: Height: 5\' 3"  (160 cm) Weight: 223 lb 1.7 oz (101.2 kg) IBW/kg (Calculated) : 52.4 Heparin Dosing Weight: 76 kg  Vital Signs: Temp: 97.9 F (36.6 C) (03/30 0558) BP: 143/69 (03/30 0558) Pulse Rate: 76 (03/30 0558)  Labs: Recent Labs    12/21/19 1606 12/21/19 2354 12/22/19 0301 12/22/19 0301 12/22/19 4680 12/22/19 0935 12/22/19 1948 12/23/19 0535 12/23/19 0814  HGB 11.9*  --  11.7*   < > 11.9*  --   --  11.5*  --   HCT 38.4  --  38.3  --  38.3  --   --  37.5  --   PLT 253  --  248  --  240  --   --  235  --   HEPARINUNFRC  --   --   --   --   --  1.06* 1.16*  --  0.80*  CREATININE 3.88*  --   --   --  3.39*  --   --  3.33*  --   TROPONINIHS 97*   < > 151*   < > 132* 128*  --  105*  --    < > = values in this interval not displayed.    Estimated Creatinine Clearance: 16.3 mL/min (A) (by C-G formula based on SCr of 3.33 mg/dL (H)).   Medical History: Past Medical History:  Diagnosis Date  . Acute renal insufficiency 10/15/2013  . Arthritis   . Cancer [facility]) 2008   right breast  . Cancer of breast- s/p chemo and Rt mastectomy 2008 10/13/2013  . Cardiomyopathy ([place])    dating back to 2010  . CHB (complete heart block) ([place]) 10/13/2013  . Chronic combined systolic and diastolic congestive heart failure ([place])   . Diabetes mellitus    with neuropathy  . Diabetes mellitus type 2, controlled, without complications ([place]) 12/14/2246  . Dyslipidemia 10/13/2013  . History of blood transfusion   . Hypercholesterolemia   . Hypertension   . Mitral stenosis- moderate with trivial MR  10/15/2013  . Pacemaker 10/23/2014  . Presence of permanent cardiac pacemaker   . Primary osteoarthritis of left knee 11/04/2015  . Syncope 10/13/2013  . Tingling    feet bilat     Assessment: 79 yo female presented to ED with CP found to have new  PE to start IV heparin per pharmacy. Baseline labs drawn - note patient in acute renal failure. Not on anticoagulation prior to admission   Transitioning from heparin IV to apixaban VQ scan intermediate probabilty for PE- unable to do CTA chest due to renal function  Goal of Therapy:   Monitor platelets by anticoagulation protocol: Yes   Plan:  Stop heparin infusion Start apixaban 10 mg BID x 7 days followed by apixaban 5 mg BID. Monitor labs and s/s of bleeding.  Margot Ables, PharmD Clinical Pharmacist 12/23/2019 11:32 AM

## 2019-12-23 NOTE — Plan of Care (Signed)

## 2019-12-23 NOTE — Progress Notes (Signed)
  Echocardiogram 2D Echocardiogram has been performed.  Cassandra Parker 12/23/2019, 12:37 PM

## 2019-12-23 NOTE — Progress Notes (Signed)
[place] for IV heparin  Indication: pulmonary embolus  No Known Allergies  Patient Measurements: Height: 5\' 3"  (160 cm) Weight: 223 lb 1.7 oz (101.2 kg) IBW/kg (Calculated) : 52.4 Heparin Dosing Weight: 76 kg  Vital Signs: Temp: 97.9 F (36.6 C) (03/30 0558) Temp Source: Oral (03/29 2135) BP: 143/69 (03/30 0558) Pulse Rate: 76 (03/30 0558)  Labs: Recent Labs    12/21/19 1606 12/21/19 2354 12/22/19 0301 12/22/19 0301 12/22/19 3888 12/22/19 0935 12/22/19 1948 12/23/19 0535 12/23/19 0814  HGB 11.9*  --  11.7*   < > 11.9*  --   --  11.5*  --   HCT 38.4  --  38.3  --  38.3  --   --  37.5  --   PLT 253  --  248  --  240  --   --  235  --   HEPARINUNFRC  --   --   --   --   --  1.06* 1.16*  --  0.80*  CREATININE 3.88*  --   --   --  3.39*  --   --  3.33*  --   TROPONINIHS 97*   < > 151*   < > 132* 128*  --  105*  --    < > = values in this interval not displayed.    Estimated Creatinine Clearance: 16.3 mL/min (A) (by C-G formula based on SCr of 3.33 mg/dL (H)).   Medical History: Past Medical History:  Diagnosis Date  . Acute renal insufficiency 10/15/2013  . Arthritis   . Cancer [facility]) 2008   right breast  . Cancer of breast- s/p chemo and Rt mastectomy 2008 10/13/2013  . Cardiomyopathy ([place])    dating back to 2010  . CHB (complete heart block) ([place]) 10/13/2013  . Chronic combined systolic and diastolic congestive heart failure ([place])   . Diabetes mellitus    with neuropathy  . Diabetes mellitus type 2, controlled, without complications ([place]) 2/80/0349  . Dyslipidemia 10/13/2013  . History of blood transfusion   . Hypercholesterolemia   . Hypertension   . Mitral stenosis- moderate with trivial MR  10/15/2013  . Pacemaker 10/23/2014  . Presence of permanent cardiac pacemaker   . Primary osteoarthritis of left knee 11/04/2015  . Syncope 10/13/2013  . Tingling    feet bilat     Assessment: 79 yo female presented to ED with  CP found to have new PE to start IV heparin per pharmacy. Baseline labs drawn - note patient in acute renal failure. Not on anticoagulation prior to admission  HL 0.8- supratherapeutic  Goal of Therapy:  Heparin level 0.3-0.7 units/ml Monitor platelets by anticoagulation protocol: Yes   Plan:  Decrease heparin drip to 750 units/hr  Heparin level in 6-8 hours and daily  -Monitor for bleeding  Margot Ables, PharmD Clinical Pharmacist 12/23/2019 8:58 AM

## 2019-12-23 NOTE — Discharge Instructions (Signed)
--1)Please take Eliquis/apixaban starter pack as advised----you will take 10 mg twice a day for the first 7 days and then 5 mg twice a day after that, when you finish the starter pack  around April 30th you will start the second prescription after completing initial starter pack  2) you are taking apixaban/Eliquis for blood thinner-- Avoid ibuprofen/Advil/Aleve/Motrin/Goody Powders/Naproxen/BC powders/Meloxicam/Diclofenac/Indomethacin and other Nonsteroidal anti-inflammatory medications as these will make you more likely to bleed and can cause stomach ulcers, can also cause Kidney problems.   3) please note that several of your heart and blood pressure medications have been changed and adjusted--please take your medications as currently prescribed not as you did prior to admission to the hospital  4) while on apixaban/Eliquis blood thinner please watch for bleeding including dark stools, maroon or mahogany stools, blood from your nose in your urine or in your stool, avoid injuries  5) follow-up with your primary care physician in about a week or so for recheck and reevaluation   Information on my medicine - ELIQUIS (apixaban)  This medication education was reviewed with me or my healthcare representative as part of my discharge preparation.  The pharmacist that spoke with me during my hospital stay was:  Ramond Craver, [facility]  Why was Eliquis prescribed for you? Eliquis was prescribed to treat blood clots that may have been found in the veins of your legs (deep vein thrombosis) or in your lungs (pulmonary embolism) and to reduce the risk of them occurring again.  What do You need to know about Eliquis ? The starting dose is 10 mg (two 5 mg tablets) taken TWICE daily for the FIRST SEVEN (7) DAYS, then on (enter date)  12/30/2019  the dose is reduced to ONE 5 mg tablet taken TWICE daily.  Eliquis may be taken with or without food.   Try to take the dose about the same time in the morning and  in the evening. If you have difficulty swallowing the tablet whole please discuss with your pharmacist how to take the medication safely.  Take Eliquis exactly as prescribed and DO NOT stop taking Eliquis without talking to the doctor who prescribed the medication.  Stopping may increase your risk of developing a new blood clot.  Refill your prescription before you run out.  After discharge, you should have regular check-up appointments with your healthcare provider that is prescribing your Eliquis.    What do you do if you miss a dose? If a dose of ELIQUIS is not taken at the scheduled time, take it as soon as possible on the same day and twice-daily administration should be resumed. The dose should not be doubled to make up for a missed dose.  Important Safety Information A possible side effect of Eliquis is bleeding. You should call your healthcare provider right away if you experience any of the following: ? Bleeding from an injury or your nose that does not stop. ? Unusual colored urine (red or dark brown) or unusual colored stools (red or black). ? Unusual bruising for unknown reasons. ? A serious fall or if you hit your head (even if there is no bleeding).  Some medicines may interact with Eliquis and might increase your risk of bleeding or clotting while on Eliquis. To help avoid this, consult your healthcare provider or pharmacist prior to using any new prescription or non-prescription medications, including herbals, vitamins, non-steroidal anti-inflammatory drugs (NSAIDs) and supplements.  This website has more information on Eliquis (apixaban): http://www.eliquis.com/eliquis/home  -1)Please take  Eliquis/apixaban starter pack as advised----you will take 10 mg twice a day for the first 7 days and then 5 mg twice a day after that, when you finish the starter pack  around April 30th you will start the second prescription after completing initial starter pack  2) you are taking  apixaban/Eliquis for blood thinner-- Avoid ibuprofen/Advil/Aleve/Motrin/Goody Powders/Naproxen/BC powders/Meloxicam/Diclofenac/Indomethacin and other Nonsteroidal anti-inflammatory medications as these will make you more likely to bleed and can cause stomach ulcers, can also cause Kidney problems.   3) please note that several of your heart and blood pressure medications have been changed and adjusted--please take your medications as currently prescribed not as you did prior to admission to the hospital  4) while on apixaban/Eliquis blood thinner please watch for bleeding including dark stools, maroon or mahogany stools, blood from your nose in your urine or in your stool, avoid injuries  5) follow-up with your primary care physician in about a week or so for recheck and reevaluation

## 2019-12-23 NOTE — Discharge Summary (Signed)
Cassandra Parker, is a 79 y.o. female  DOB [birth year]  MRN [id number].  Admission date:  12/21/2019  Admitting Physician  Roxan Hockey, MD  Discharge Date:  12/23/2019   Primary MD  Manon Hilding, MD  Recommendations for primary care physician for things to follow:   1)Please take Eliquis/apixaban starter pack as advised----you will take 10 mg twice a day for the first 7 days and then 5 mg twice a day after that, when you finish the starter pack  around April 30th you will start the second prescription after completing initial starter pack  2) you are taking apixaban/Eliquis for blood thinner-- Avoid ibuprofen/Advil/Aleve/Motrin/Goody Powders/Naproxen/BC powders/Meloxicam/Diclofenac/Indomethacin and other Nonsteroidal anti-inflammatory medications as these will make you more likely to bleed and can cause stomach ulcers, can also cause Kidney problems.   3) please note that several of your heart and blood pressure medications have been changed and adjusted--please take your medications as currently prescribed not as you did prior to admission to the hospital  4) while on apixaban/Eliquis blood thinner please watch for bleeding including dark stools, maroon or mahogany stools, blood from your nose in your urine or in your stool, avoid injuries  5) follow-up with your primary care physician in about a week or so for recheck and reevaluation  Admission Diagnosis  SOB (shortness of breath) [R06.02] Elevated serum creatinine [R79.89] Elevated troponin [R77.8] Elevated d-dimer [R79.89] Chest pain [R07.9] Dyspnea, unspecified type [R06.00] Chest pain, unspecified type [R07.9] Pulmonary embolism (HCC) [I26.99]   Discharge Diagnosis  SOB (shortness of breath) [R06.02] Elevated serum creatinine [R79.89] Elevated troponin [R77.8] Elevated d-dimer [R79.89] Chest pain [R07.9] Dyspnea, unspecified type  [R06.00] Chest pain, unspecified type [R07.9] Pulmonary embolism (HCC) [I26.99]    Principal Problem:   Pulmonary embolism--- VQ scan with intermediate probability Active Problems:   Cardiomyopathy- EF 35-40% (etiology not yet determined)   CHB (complete heart block) ([place])   HTN (hypertension)   Diabetes mellitus type 2, controlled, without complications ([place])   Cancer of breast- s/p chemo and Rt mastectomy 2008   Dyslipidemia   Mitral stenosis- moderate with trivial MR    Pacemaker   Primary osteoarthritis of left knee   Chest pain      Past Medical History:  Diagnosis Date   Acute renal insufficiency 10/15/2013   Arthritis    Cancer ([place]) 2008   right breast   Cancer of breast- s/p chemo and Rt mastectomy 2008 10/13/2013   Cardiomyopathy ([place])    dating back to 2010   CHB (complete heart block) ([place]) 10/13/2013   Chronic combined systolic and diastolic congestive heart failure ([place])    Diabetes mellitus    with neuropathy   Diabetes mellitus type 2, controlled, without complications ([place]) 2/57/4935   Dyslipidemia 10/13/2013   History of blood transfusion    Hypercholesterolemia    Hypertension    Mitral stenosis- moderate with trivial MR  10/15/2013   Pacemaker 10/23/2014   Presence of permanent cardiac pacemaker    Primary osteoarthritis of left knee  11/04/2015   Syncope 10/13/2013   Tingling    feet bilat     Past Surgical History:  Procedure Laterality Date   APPENDECTOMY     mmh   BI-VENTRICULAR PACEMAKER INSERTION N/A 10/15/2013   Procedure: BI-VENTRICULAR PACEMAKER INSERTION (CRT-P);  Surgeon: Evans Lance, MD;  Location: [facility] CATH LAB;  Service: Cardiovascular;  Laterality: N/A;   Cardiomyopathy     CATARACT EXTRACTION W/PHACO  06/29/2011   Procedure: CATARACT EXTRACTION PHACO AND INTRAOCULAR LENS PLACEMENT (IOC);  Surgeon: Tonny Branch;  Location: [facility] ORS;  Service: Ophthalmology;  Laterality: Right;  CDE: 14.41   CATARACT EXTRACTION W/PHACO   09/04/2011   Procedure: CATARACT EXTRACTION PHACO AND INTRAOCULAR LENS PLACEMENT (IOC);  Surgeon: Tonny Branch;  Location: [facility] ORS;  Service: Ophthalmology;  Laterality: Left;  CDE: 15.21   CHOLECYSTECTOMY     mmh   KNEE ARTHROSCOPY     right-mmh   MASTECTOMY  2008    mmh-right   TEMPORARY PACEMAKER INSERTION N/A 10/13/2013   Procedure: TEMPORARY PACEMAKER INSERTION;  Surgeon: Jolaine Artist, MD;  Location: [facility] CATH LAB;  Service: Cardiovascular;  Laterality: N/A;   TOTAL KNEE ARTHROPLASTY Left 11/04/2015   Procedure: LEFT TOTAL KNEE ARTHROPLASTY;  Surgeon: Rod Can, MD;  Location: [facility] ORS;  Service: Orthopedics;  Laterality: Left;       HPI  from the history and physical done on the day of admission:    HPI: Cassandra Parker is a 79 y.o. female with medical history significant of insulin-dependent diabetes mellitus, hypertension,CKD stage IV,hyperlipidemia, and breast cancer status post right mastectomy. She also has a history of complete heart block. She presented to the ER with chest pain ongoing for several months.  She also reported some exertional dyspnea to her PCP and had an increase in Lasix from 40 to 60 mg.  Saw her cardiologist on 12/18/2019 and at that time was thought to be euvolemic.  Patient reports having on and off intermittent chest pain going across the entire chest since at least November.  Has had some associated shortness of breath and exertional dyspnea.  Was reported by EMS to have a room air saturation in the 70s. No nausea, vomiting, abdominal pain, fever, chills, palpitations, dysuria, diarrhea, seizures, syncope, focal deficits, dizziness, lightheadedness. ED Course:  Vital Signs reviewed on presentation, significant for 97.7, heart rate 75, blood pressure 169/70, saturation 98% on 2 L nasal cannula. Labs reviewed, significant for sodium 140, potassium 4.6, BUN 96, creatinine 3.8, LFTs within normal limits, troponin 97 with repeat 148.  WBC count 10.2,  hemoglobin 11.7, hematocrit 38, platelets 248.  D-dimer 7.78.  TSH 4.9.  SARS Covid RT-PCR is negative. Imaging personally Reviewed, chest x-ray shows stable exam, cardiomegaly, pacemaker in place, central vascular congestion with no evidence of effusion, airspace disease. EKG personally reviewed, shows ventricular paced rhythm.   Hospital Course:     A/p 1)Chest pains ACS Versus PE---- --Burtis Junes PE over ACS,  -Treated with IV heparin,  echocardiogram pending -Severely elevated D-dimer noted -Borderline elevated troponin noted-peaking at 151 -Repeat echo with preserved EF of 60 to 65% similar to prior echo from 03/14/3558, grade 1 diastolic dysfunction noted as well as mild pulmonary hypertension - 2)Possible Pulmonary Embolism--- VQ scan is intermediate probability for PE, unable to do CTA chest due to renal function --Lower extremity venous Dopplers without acute DVT -Risk versus benefits of anticoagulation discussed with patient and her son and her daughter ---Echo report as noted above #1 --Patient and family have  decided to go with Eliquis for anticoagulation -Patient aware that she will need anticoagulation for at least 6 months maybe longer -Bleeding concerns again addressed -Patient ambulating in the hallways O2 sats 93 to 96% on room air  3)HFpEF--patient with chronic diastolic dysfunction CHF with EF of 60 to 65% based on echo from 04/14/2019 -Please see repeat echo above #1 -Continue losartan but decrease dose to 50 mg daily -Patient appears clinically euvolemic --Stop chlorthalidone as this is not likely to be effective in the patient with CKD stage IV -Continue Lasix 40 mg daily with potassium supplementation  4)AKI----acute kidney injury on CKD stage - IV  --- worsening renal function due to dehydration in setting of diarrhea compounded by Lasix and losartan use  ---creatinine on admission= 3.33 , -- baseline creatinine = 2.6 (06/2019) --- creatinine is now=3.39 (peak  this admission 3.88)  , renally adjust medications, avoid nephrotoxic agents / dehydration  / hypotension -Losartan was on hold, okay to restart losartan at 50 mg daily rather than 100 -Stop chlorthalidone as this is not likely to be effective in the patient with CKD stage IV -Continue Lasix 40 mg daily with potassium supplementation  5)DM2-no recent A1c, okay to restart glipizide,    6) history of breast cancer--- status post prior right-sided mastectomy and chemotherapy--stable  7) chronic mild normocytic and normochromic anemia--- suspect due to underlying CKD--no evidence of ongoing bleeding  8)h/o CHB--patient is status post PPM--stable  Disposition--- discharge home in stable condition without hypoxia   code Status : full  Family Communication:   (patient is alert, awake and coherent) --Discussed with son and daughter at bedside  Consults  : na  Follow UP--- PCP within a week for recheck and reevaluation   Diet and Activity recommendation:  As advised  Discharge Instructions    Discharge Instructions    Call MD for:  difficulty breathing, headache or visual disturbances   Complete by: As directed    Call MD for:  extreme fatigue   Complete by: As directed    Call MD for:  persistant dizziness or light-headedness   Complete by: As directed    Call MD for:  persistant nausea and vomiting   Complete by: As directed    Call MD for:  temperature >100.4   Complete by: As directed    Diet - low sodium heart healthy   Complete by: As directed    Diet Carb Modified   Complete by: As directed    Discharge instructions   Complete by: As directed    1)Please take Eliquis/apixaban starter pack as advised----you will take 10 mg twice a day for the first 7 days and then 5 mg twice a day after that, when you finish the starter pack  around April 30th you will start the second prescription after completing initial starter pack  2) you are taking apixaban/Eliquis for blood  thinner-- Avoid ibuprofen/Advil/Aleve/Motrin/Goody Powders/Naproxen/BC powders/Meloxicam/Diclofenac/Indomethacin and other Nonsteroidal anti-inflammatory medications as these will make you more likely to bleed and can cause stomach ulcers, can also cause Kidney problems.   3) please note that several of your heart and blood pressure medications have been changed and adjusted--please take your medications as currently prescribed not as you did prior to admission to the hospital  4) while on apixaban/Eliquis blood thinner please watch for bleeding including dark stools, maroon or mahogany stools, blood from your nose in your urine or in your stool, avoid injuries  5) follow-up with your primary care physician in  about a week or so for recheck and reevaluation   Increase activity slowly   Complete by: As directed       Discharge Medications     Allergies as of 12/23/2019   No Known Allergies     Medication List    STOP taking these medications   aspirin 81 MG chewable tablet   aspirin EC 81 MG tablet   chlorthalidone 25 MG tablet Commonly known as: HYGROTON     TAKE these medications   acetaminophen 325 MG tablet Commonly known as: TYLENOL Take 2 tablets (650 mg total) by mouth every 6 (six) hours as needed for mild pain or headache (or Fever >/= 101).   amLODipine 5 MG tablet Commonly known as: NORVASC Take 1 tablet (5 mg total) by mouth daily. For BP   carvedilol 3.125 MG tablet Commonly known as: COREG Take 1 tablet (3.125 mg total) by mouth 2 (two) times daily. What changed: medication strength   Eliquis DVT/PE Starter Pack 5 MG Tbpk Generic drug: Apixaban Starter Pack Take as directed on package: start with two-5mg  tablets twice daily for 7 days. On day 8, switch to one-5mg  tablet twice daily.   apixaban 5 MG Tabs tablet Commonly known as: Eliquis Take 1 tablet (5 mg total) by mouth 2 (two) times daily. Start around April 30th after completing initial starter  pack Start taking on: January 23, 2020   furosemide 40 MG tablet Commonly known as: LASIX Take 1 tablet (40 mg total) by mouth in the morning. Start taking on: December 24, 2019 What changed: See the new instructions.   glipiZIDE 5 MG 24 hr tablet Commonly known as: GLUCOTROL XL Take 5 mg by mouth in the morning and at bedtime.   hydrALAZINE 50 MG tablet Commonly known as: APRESOLINE Take 1 tablet (50 mg total) by mouth 2 (two) times daily. What changed: See the new instructions.   losartan 50 MG tablet Commonly known as: COZAAR Take 1 tablet (50 mg total) by mouth daily. What changed:   medication strength  how much to take   potassium chloride 10 MEQ tablet Commonly known as: KLOR-CON Take 1 tablet (10 mEq total) by mouth in the morning. Start taking on: December 24, 2019 What changed: See the new instructions.   pravastatin 10 MG tablet Commonly known as: PRAVACHOL Take 10 mg by mouth at bedtime.   vitamin B-12 1000 MCG tablet Commonly known as: CYANOCOBALAMIN Take 1,000 mcg by mouth daily.   Vitamin D 50 MCG (2000 UT) tablet Take 2,000 Units by mouth daily.      Major procedures and Radiology Reports - PLEASE review detailed and final reports for all details, in brief -   NM Pulmonary Perfusion  Addendum Date: 12/22/2019   ADDENDUM REPORT: 12/22/2019 13:10 ADDENDUM: Patient returned for ventilation exam utilizing 44 millicuries of JJ-00X DTPA aerosol. Images were obtained in 8 projections. Comparison is made to the preceding perfusion study, and chest radiograph of 12/21/2019. Worse ventilation than perfusion in LEFT lower lobe. Ventilatory defects are identified within the RIGHT middle and RIGHT lower lobes, though slightly smaller than the accompanying perfusion abnormalities especially on RPO and RAO views. Chest radiograph clear at the RIGHT lung base. Findings represent an intermediate probability for pulmonary embolism. Electronically Signed   By: Lavonia Dana M.D.    On: 12/22/2019 13:10   Result Date: 12/22/2019 CLINICAL DATA:  Shortness of breath, elevated D-dimer, elevated creatinine EXAM: NUCLEAR MEDICINE PERFUSION LUNG SCAN TECHNIQUE: Perfusion images were obtained  in multiple projections after intravenous injection of radiopharmaceutical. Ventilation scans intentionally deferred if perfusion scan and chest x-ray adequate for interpretation during COVID 19 epidemic. RADIOPHARMACEUTICALS:  1.6 mCi Tc-71m MAA IV COMPARISON:  Correlation with chest radiograph dated 12/21/2019 FINDINGS: Single large perfusion defect in the right middle lobe. Possible small defects in the posterior right lower lobe and anterior left lung apex, although equivocal. Corresponding chest radiograph is clear, including specifically in these regions. Ventilation imaging not performed. IMPRESSION: Findings suspicious for pulmonary embolism, as described above. These results were called by telephone at the time of interpretation on 12/22/2019 at 12:10 am to provider Dr Reather Converse, who verbally acknowledged these results. Electronically Signed: By: Julian Hy M.D. On: 12/22/2019 00:12   NM Pulmonary Perf and Vent  Result Date: 12/23/2019 CLINICAL DATA:  Shortness of breath, elevated D-dimer, elevated creatinine EXAM: NUCLEAR MEDICINE VENTILATION - PERFUSION LUNG SCAN TECHNIQUE: Perfusion images were obtained in multiple projections after intravenous injection of radiopharmaceutical. Ventilation scans intentionally deferred if perfusion scan and chest x-ray adequate for interpretation during COVID 19 epidemic. RADIOPHARMACEUTICALS:  1.6 mCi Tc-38m MAA IV COMPARISON:  Correlation with chest radiograph dated 12/21/2019 FINDINGS: Single large perfusion defect in the right middle lobe. Possible small defects in the posterior right lower lobe and anterior left lung apex, although equivocal. Corresponding chest radiograph is clear, including specifically in these regions. Ventilation imaging not  performed. IMPRESSION: Findings suspicious for pulmonary embolism, as described above. These results were called by telephone at the time of interpretation on 12/22/2019 at 12:10 am to provider Dr Reather Converse , who verbally acknowledged these results. ADDENDUM: Patient returned for ventilation exam utilizing 44 millicuries of EQ-68T DTPA aerosol. Images were obtained in 8 projections. Comparison is made to the preceding perfusion study, and chest radiograph of 12/21/2019. Worse ventilation than perfusion in LEFT lower lobe. Ventilatory defects are identified within the RIGHT middle and RIGHT lower lobes, though slightly smaller than the accompanying perfusion abnormalities especially on RPO and RAO views. Chest radiograph clear at the RIGHT lung base. Findings represent an intermediate probability for pulmonary embolism. Electronically Signed   By: Lavonia Dana M.D.   On: 12/23/2019 15:10   US Venous Img Lower Bilateral (DVT)  Result Date: 12/22/2019 CLINICAL DATA:  Pulmonary embolism.  Elevated D-dimer. EXAM: BILATERAL LOWER EXTREMITY VENOUS DOPPLER ULTRASOUND TECHNIQUE: Gray-scale sonography with compression, as well as color and duplex ultrasound, were performed to evaluate the deep venous systems from the level of the common femoral vein through the popliteal and proximal calf veins. COMPARISON:  None. FINDINGS: VENOUS Normal compressibility of the common femoral, superficial femoral, and popliteal veins, as well as the visualized calf veins. Visualized portions of profunda femoral vein and great saphenous vein unremarkable. No filling defects to suggest DVT on grayscale or color Doppler imaging. Doppler waveforms show normal direction of venous flow, normal respiratory phasicity and response to augmentation. Limited views of the contralateral common femoral vein are unremarkable. OTHER None. Limitations: none IMPRESSION: No femoropopliteal DVT nor evidence of DVT within the visualized calf veins OF BOTH LOWER  EXTREMITIES. If clinical symptoms are inconsistent or if there are persistent or worsening symptoms, further imaging (possibly involving the iliac veins) may be warranted. Electronically Signed   By: Lorriane Shire M.D.   On: 12/22/2019 10:34   DG Chest Port 1 View  Result Date: 12/21/2019 CLINICAL DATA:  Chest pain, shortness of breath, history of pacemaker EXAM: PORTABLE CHEST 1 VIEW COMPARISON:  11/19/2019 FINDINGS: Single frontal view of the chest demonstrates  stable multi lead pacemaker and enlarged cardiac silhouette. Stable central vascular congestion without airspace disease, effusion, or pneumothorax. IMPRESSION: 1. Stable exam, no acute process. Electronically Signed   By: Randa Ngo M.D.   On: 12/21/2019 16:14   ECHOCARDIOGRAM COMPLETE  Result Date: 12/23/2019    ECHOCARDIOGRAM REPORT   Patient Name:   Cassandra Parker Date of Exam: 12/23/2019 Medical Rec #:  [id number]          Height:       63.0 in Accession #:    [id number]         Weight:       223.1 lb Date of Birth:  [birth year]         BSA:          2.026 m Patient Age:    66 years           BP:           143/69 mmHg Patient Gender: F                  HR:           76 bpm. Exam Location:  [facility] Procedure: 2D Echo, Cardiac Doppler and Color Doppler Indications:    Pulmonary embolus, chest pain  History:        Patient has prior history of Echocardiogram examinations, most                 recent 04/23/2019. Cardiomyopathy, Pacemaker, Mitral Valve                 Disease, Signs/Symptoms:Chest Pain; Risk Factors:Hypertension,                 Diabetes and Dyslipidemia.  Sonographer:    Dustin Flock RDCS Referring Phys: [place]  1. Left ventricular ejection fraction, by estimation, is 60 to 65%. The left ventricle has normal function. The left ventricle has no regional wall motion abnormalities. There is mild concentric left ventricular hypertrophy. Left ventricular diastolic parameters are consistent with  Grade I diastolic dysfunction (impaired relaxation). Elevated left ventricular end-diastolic pressure.  2. Right ventricular systolic function is normal. The right ventricular size is mildly enlarged. There is mildly elevated pulmonary artery systolic pressure.  3. Left atrial size was severely dilated.  4. Right atrial size was mildly dilated.  5. The mitral valve is degenerative. Trivial mitral valve regurgitation.  6. The aortic valve is tricuspid. Aortic valve regurgitation is not visualized. No aortic stenosis is present.  7. The inferior vena cava is normal in size with greater than 50% respiratory variability, suggesting right atrial pressure of 3 mmHg. FINDINGS  Left Ventricle: Left ventricular ejection fraction, by estimation, is 60 to 65%. The left ventricle has normal function. The left ventricle has no regional wall motion abnormalities. The left ventricular internal cavity size was normal in size. There is  mild concentric left ventricular hypertrophy. Left ventricular diastolic parameters are consistent with Grade I diastolic dysfunction (impaired relaxation). Elevated left ventricular end-diastolic pressure. Right Ventricle: The right ventricular size is mildly enlarged. No increase in right ventricular wall thickness. Right ventricular systolic function is normal. There is mildly elevated pulmonary artery systolic pressure. The tricuspid regurgitant velocity is 2.53 m/s, and with an assumed right atrial pressure of 8 mmHg, the estimated right ventricular systolic pressure is 29.9 mmHg. Left Atrium: Left atrial size was severely dilated. Right Atrium: Right atrial size was mildly dilated. Pericardium: There is no evidence of  pericardial effusion. Mitral Valve: The mitral valve is degenerative in appearance. There is mild thickening of the mitral valve leaflet(s). Moderate mitral annular calcification. Trivial mitral valve regurgitation. Tricuspid Valve: The tricuspid valve is grossly normal. Tricuspid  valve regurgitation is mild. Aortic Valve: The aortic valve is tricuspid. Aortic valve regurgitation is not visualized. No aortic stenosis is present. Mild aortic valve annular calcification. Pulmonic Valve: The pulmonic valve was grossly normal. Pulmonic valve regurgitation is mild. Aorta: The aortic root is normal in size and structure. Venous: The inferior vena cava is normal in size with greater than 50% respiratory variability, suggesting right atrial pressure of 3 mmHg. IAS/Shunts: No atrial level shunt detected by color flow Doppler. Additional Comments: A pacer wire is visualized.  LEFT VENTRICLE PLAX 2D LVIDd:         4.03 cm  Diastology LVIDs:         2.88 cm  LV e' lateral:   4.35 cm/s LV PW:         1.15 cm  LV E/e' lateral: 17.2 LV IVS:        1.09 cm  LV e' medial:    4.03 cm/s LVOT diam:     1.80 cm  LV E/e' medial:  18.5 LV SV:         47 LV SV Index:   23 LVOT Area:     2.54 cm  RIGHT VENTRICLE RV Basal diam:  3.38 cm RV S prime:     12.30 cm/s TAPSE (M-mode): 3.0 cm LEFT ATRIUM             Index LA diam:        3.70 cm 1.83 cm/m LA Vol (A2C):   90.4 ml 44.63 ml/m LA Vol (A4C):   79.1 ml 39.05 ml/m LA Biplane Vol: 88.3 ml 43.59 ml/m  AORTIC VALVE LVOT Vmax:   99.80 cm/s LVOT Vmean:  70.400 cm/s LVOT VTI:    0.185 m  AORTA Ao Root diam: 3.00 cm MITRAL VALVE                TRICUSPID VALVE MV Area (PHT): 2.95 cm     TR Peak grad:   25.6 mmHg MV Decel Time: 257 msec     TR Vmax:        253.00 cm/s MV E velocity: 74.70 cm/s MV A velocity: 112.00 cm/s  SHUNTS MV E/A ratio:  0.67         Systemic VTI:  0.18 m                             Systemic Diam: 1.80 cm Kate Sable MD Electronically signed by Kate Sable MD Signature Date/Time: 12/23/2019/2:18:52 PM    Final     Micro Results    Recent Results (from the past 240 hour(s))  SARS CORONAVIRUS 2 (TAT 6-24 HRS) Nasopharyngeal Nasopharyngeal Swab     Status: None   Collection Time: 12/21/19  3:48 PM   Specimen: Nasopharyngeal Swab   Result Value Ref Range Status   SARS Coronavirus 2 NEGATIVE NEGATIVE Final    Comment: (NOTE) SARS-CoV-2 target nucleic acids are NOT DETECTED. The SARS-CoV-2 RNA is generally detectable in upper and lower respiratory specimens during the acute phase of infection. Negative results do not preclude SARS-CoV-2 infection, do not rule out co-infections with other pathogens, and should not be used as the sole basis for treatment or other patient management decisions. Negative results  must be combined with clinical observations, patient history, and epidemiological information. The expected result is Negative. Fact Sheet for Patients: SugarRoll[employer]be Fact Sheet for Healthcare Providers: https://www.woods-mathews.com/ This test is not yet approved or cleared by the [place] FDA and  has been authorized for detection and/or diagnosis of SARS-CoV-2 by FDA under an Emergency Use Authorization (EUA). This EUA will remain  in effect (meaning this test can be used) for the duration of the COVID-19 declaration under Section 56 4(b)(1) of the Act, 21 U.S.C. section 360bbb-3(b)(1), unless the authorization is terminated or revoked sooner. Performed at [facility], [place] [address], [place], [place] [address]        Today   Subjective    Cassandra Parker today has no new concerns, son abdominal at bedside, questions answered -No chest pains no shortness of breath and no dyspnea on exertion, no palpitations no dizziness -No concerns about bleeding, specifically no hematuria no epistaxis and no change in stool color     --Patient ambulating in the hallways O2 sats 93 to 96% on room air      Patient has been seen and examined prior to discharge   Objective   Blood pressure (!) 143/69, pulse 76, temperature 97.9 F (36.6 C), resp. rate 16, height 5\' 3"  (1.6 m), weight 101.2 kg, SpO2 98 %.   Intake/Output Summary (Last 24 hours) at 12/23/2019  1554 Last data filed at 12/23/2019 0500 Gross per 24 hour  Intake 269.7 ml  Output 300 ml  Net -30.3 ml    Exam Gen:- Awake Alert, no acute distress  HEENT:- [place].AT, No sclera icterus Neck-Supple Neck,No JVD,.  Lungs-  CTAB , good air movement bilaterally  CV- S1, S2 normal, regular, pacemaker in situ Abd-  +ve B.Sounds, Abd Soft, No tenderness,    Extremity/Skin:- No  edema,   good pulses Psych-affect is appropriate, oriented x3 Neuro-no new focal deficits, no tremors    Data Review   CBC w Diff:  Lab Results  Component Value Date   WBC 9.7 12/23/2019   HGB 11.5 (L) 12/23/2019   HCT 37.5 12/23/2019   PLT 235 12/23/2019   LYMPHOPCT 20 12/21/2019   MONOPCT 5 12/21/2019   EOSPCT 1 12/21/2019   BASOPCT 1 12/21/2019    CMP:  Lab Results  Component Value Date   NA 139 12/23/2019   K 4.0 12/23/2019   CL 110 12/23/2019   CO2 20 (L) 12/23/2019   BUN 89 (H) 12/23/2019   CREATININE 3.33 (H) 12/23/2019   PROT 6.7 12/21/2019   ALBUMIN 3.2 (L) 12/21/2019   BILITOT 0.3 12/21/2019   ALKPHOS 63 12/21/2019   AST 17 12/21/2019   ALT 17 12/21/2019  .   Total Discharge time is about 33 minutes  Roxan Hockey M.D on 12/23/2019 at 3:54 PM  Go to [email or web address] -  for contact info  [facility] - Office  [phone]

## 2019-12-24 ENCOUNTER — Ambulatory Visit (HOSPITAL_COMMUNITY): Admission: RE | Admit: 2019-12-24 | Payer: Medicare HMO | Source: Ambulatory Visit

## 2019-12-24 LAB — CUP PACEART REMOTE DEVICE CHECK
Battery Remaining Longevity: 36 mo
Battery Remaining Percentage: 44 %
Battery Voltage: 2.86 V
Brady Statistic AP VP Percent: 1.8 %
Brady Statistic AP VS Percent: 1 %
Brady Statistic AS VP Percent: 98 %
Brady Statistic AS VS Percent: 1 %
Brady Statistic RA Percent Paced: 1.7 %
Date Time Interrogation Session: 20210330175558
Implantable Lead Implant Date: 20150121
Implantable Lead Implant Date: 20150121
Implantable Lead Implant Date: 20150121
Implantable Lead Location: 753858
Implantable Lead Location: 753859
Implantable Lead Location: 753860
Implantable Pulse Generator Implant Date: 20150121
Lead Channel Impedance Value: 400 Ohm
Lead Channel Impedance Value: 430 Ohm
Lead Channel Impedance Value: 510 Ohm
Lead Channel Pacing Threshold Amplitude: 0.75 V
Lead Channel Pacing Threshold Amplitude: 0.75 V
Lead Channel Pacing Threshold Amplitude: 1.25 V
Lead Channel Pacing Threshold Pulse Width: 0.5 ms
Lead Channel Pacing Threshold Pulse Width: 0.5 ms
Lead Channel Pacing Threshold Pulse Width: 0.5 ms
Lead Channel Sensing Intrinsic Amplitude: 12 mV
Lead Channel Sensing Intrinsic Amplitude: 4.3 mV
Lead Channel Setting Pacing Amplitude: 2 V
Lead Channel Setting Pacing Amplitude: 2.25 V
Lead Channel Setting Pacing Amplitude: 2.5 V
Lead Channel Setting Pacing Pulse Width: 0.5 ms
Lead Channel Setting Pacing Pulse Width: 0.5 ms
Lead Channel Setting Sensing Sensitivity: 2 mV
Pulse Gen Model: 3242
Pulse Gen Serial Number: 7557114

## 2019-12-25 ENCOUNTER — Telehealth: Payer: Self-pay | Admitting: *Deleted

## 2019-12-25 DIAGNOSIS — I10 Essential (primary) hypertension: Secondary | ICD-10-CM

## 2019-12-25 DIAGNOSIS — N184 Chronic kidney disease, stage 4 (severe): Secondary | ICD-10-CM

## 2019-12-25 NOTE — Telephone Encounter (Signed)
Cassandra Parker, [place]  03/26/5663 8:30 AM EDT    Left message to return call.

## 2019-12-25 NOTE — Telephone Encounter (Signed)
Referral to Nephrology has already been done.     Laurine Blazer, [place]  06/03/8337 2:50 PM EDT    Patient notified. Copy to pcp. Will put new order in Epic for repeat BMET. She see Dr. Lovena Le in [place] on 12/30/2019 & will do at [facility] same day.    Herminio Commons, MD  12/25/2019 11:32 AM EDT    Yes, I would reduce to 40 mg daily. She also needs to see nephrology.   Laurine Blazer, [place]  01/25/9766 3:41 AM EDT    Patient states that she was on 60mg  of Lasix previously & had ED visit on 12/21/2019. Please advise if you still want her to decrease to 40mg  daily or lower based on recent hospital visit.

## 2019-12-25 NOTE — Telephone Encounter (Signed)
-----   Message from Herminio Commons, MD sent at 12/22/2019  8:47 AM EDT ----- BUN and creatinine are significantly elevated.  I would hold all diuretics for the next 3 days and then start taking Lasix 40 mg daily with a follow-up basic metabolic panel in 1 week.  Please forward a copy of results to patient's PCP and new nephrologist.

## 2019-12-30 ENCOUNTER — Other Ambulatory Visit (HOSPITAL_COMMUNITY)
Admission: RE | Admit: 2019-12-30 | Discharge: 2019-12-30 | Disposition: A | Discharge status: Home / Self Care | Payer: Medicare HMO | Source: Ambulatory Visit | Attending: Cardiovascular Disease | Admitting: Cardiovascular Disease

## 2019-12-30 ENCOUNTER — Encounter: Payer: Self-pay | Admitting: Internal Medicine

## 2019-12-30 ENCOUNTER — Ambulatory Visit (INDEPENDENT_AMBULATORY_CARE_PROVIDER_SITE_OTHER): Payer: Medicare HMO | Admitting: Internal Medicine

## 2019-12-30 ENCOUNTER — Other Ambulatory Visit: Payer: Self-pay

## 2019-12-30 VITALS — BP 122/64 | HR 80 | Temp 98.3°F | Ht 63.0 in | Wt 225.4 lb

## 2019-12-30 DIAGNOSIS — I5022 Chronic systolic (congestive) heart failure: Secondary | ICD-10-CM

## 2019-12-30 DIAGNOSIS — Z950 Presence of cardiac pacemaker: Secondary | ICD-10-CM

## 2019-12-30 DIAGNOSIS — N184 Chronic kidney disease, stage 4 (severe): Secondary | ICD-10-CM | POA: Insufficient documentation

## 2019-12-30 DIAGNOSIS — I10 Essential (primary) hypertension: Secondary | ICD-10-CM | POA: Diagnosis not present

## 2019-12-30 LAB — CUP PACEART INCLINIC DEVICE CHECK
Date Time Interrogation Session: 20210406140048
Implantable Lead Implant Date: 20150121
Implantable Lead Implant Date: 20150121
Implantable Lead Implant Date: 20150121
Implantable Lead Location: 753858
Implantable Lead Location: 753859
Implantable Lead Location: 753860
Implantable Pulse Generator Implant Date: 20150121
Pulse Gen Model: 3242
Pulse Gen Serial Number: 7557114

## 2019-12-30 LAB — BASIC METABOLIC PANEL
Anion gap: 9 (ref 5–15)
BUN: 92 mg/dL — ABNORMAL HIGH (ref 8–23)
CO2: 20 mmol/L — ABNORMAL LOW (ref 22–32)
Calcium: 8.9 mg/dL (ref 8.9–10.3)
Chloride: 110 mmol/L (ref 98–111)
Creatinine, Ser: 3.21 mg/dL — ABNORMAL HIGH (ref 0.44–1.00)
GFR calc Af Amer: 15 mL/min — ABNORMAL LOW (ref >60)
GFR calc non Af Amer: 13 mL/min — ABNORMAL LOW (ref >60)
Glucose, Bld: 179 mg/dL — ABNORMAL HIGH (ref 70–99)
Potassium: 4.6 mmol/L (ref 3.5–5.1)
Sodium: 139 mmol/L (ref 135–145)

## 2019-12-30 NOTE — Progress Notes (Signed)
HPI Cassandra Parker returns today for followup. She is a pleasant 79 yo woman with a h/o Non-ischemic CM, LBBB, s/p biv PPM insertion with normalization of her LV function. She has occaisional episodes of atrial arrhythmias. She was in the ER with an episode of SVT a couple of weeks ago. She had reverted back to NSR before the paramedics arrived. She feels well. She has had a repeat 2D echo which shows that her LV function remains preserved.  No Known Allergies   Current Outpatient Medications  Medication Sig Dispense Refill  . acetaminophen (TYLENOL) 325 MG tablet Take 2 tablets (650 mg total) by mouth every 6 (six) hours as needed for mild pain or headache (or Fever >/= 101). 30 tablet 2  . amLODipine (NORVASC) 5 MG tablet Take 1 tablet (5 mg total) by mouth daily. For BP 30 tablet 11  . [START ON 01/23/2020] apixaban (ELIQUIS) 5 MG TABS tablet Take 1 tablet (5 mg total) by mouth 2 (two) times daily. Start around April 30th after completing initial starter pack 60 tablet 4  . Apixaban Starter Pack (ELIQUIS DVT/PE STARTER PACK) 5 MG TBPK Take as directed on package: start with two-5mg  tablets twice daily for 7 days. On day 8, switch to one-5mg  tablet twice daily. 1 each 0  . carvedilol (COREG) 3.125 MG tablet Take 1 tablet (3.125 mg total) by mouth 2 (two) times daily. 60 tablet 2  . Cholecalciferol (VITAMIN D) 2000 UNITS tablet Take 2,000 Units by mouth daily.     . furosemide (LASIX) 40 MG tablet Take 1 tablet (40 mg total) by mouth in the morning. 30 tablet 3  . glipiZIDE (GLUCOTROL XL) 5 MG 24 hr tablet Take 5 mg by mouth in the morning and at bedtime.     . hydrALAZINE (APRESOLINE) 50 MG tablet Take 1 tablet (50 mg total) by mouth 2 (two) times daily. 60 tablet 5  . losartan (COZAAR) 50 MG tablet Take 1 tablet (50 mg total) by mouth daily. 30 tablet 4  . potassium chloride (KLOR-CON) 10 MEQ tablet Take 1 tablet (10 mEq total) by mouth in the morning. 30 tablet 2  . pravastatin  (PRAVACHOL) 10 MG tablet Take 10 mg by mouth at bedtime.     . vitamin B-12 (CYANOCOBALAMIN) 1000 MCG tablet Take 1,000 mcg by mouth daily.     No current facility-administered medications for this visit.     Past Medical History:  Diagnosis Date  . Acute renal insufficiency 10/15/2013  . Arthritis   . Cancer [facility] 2008   right breast  . Cancer of breast- s/p chemo and Rt mastectomy 2008 10/13/2013  . Cardiomyopathy ([place])    dating back to 2010  . CHB (complete heart block) ([place]) 10/13/2013  . Chronic combined systolic and diastolic congestive heart failure ([place])   . Diabetes mellitus    with neuropathy  . Diabetes mellitus type 2, controlled, without complications ([place]) 3/66/4403  . Dyslipidemia 10/13/2013  . History of blood transfusion   . Hypercholesterolemia   . Hypertension   . Mitral stenosis- moderate with trivial MR  10/15/2013  . Pacemaker 10/23/2014  . Presence of permanent cardiac pacemaker   . Primary osteoarthritis of left knee 11/04/2015  . Syncope 10/13/2013  . Tingling    feet bilat     ROS:   All systems reviewed and negative except as noted in the HPI.   Past Surgical History:  Procedure Laterality Date  . APPENDECTOMY  mmh  . BI-VENTRICULAR PACEMAKER INSERTION N/A 10/15/2013   Procedure: BI-VENTRICULAR PACEMAKER INSERTION (CRT-P);  Surgeon: Evans Lance, MD;  Location: [facility] CATH LAB;  Service: Cardiovascular;  Laterality: N/A;  . Cardiomyopathy    . CATARACT EXTRACTION W/PHACO  06/29/2011   Procedure: CATARACT EXTRACTION PHACO AND INTRAOCULAR LENS PLACEMENT (IOC);  Surgeon: Tonny Branch;  Location: [facility] ORS;  Service: Ophthalmology;  Laterality: Right;  CDE: 14.41  . CATARACT EXTRACTION W/PHACO  09/04/2011   Procedure: CATARACT EXTRACTION PHACO AND INTRAOCULAR LENS PLACEMENT (IOC);  Surgeon: Tonny Branch;  Location: [facility] ORS;  Service: Ophthalmology;  Laterality: Left;  CDE: 15.21  . CHOLECYSTECTOMY     mmh  . KNEE ARTHROSCOPY     right-mmh  . MASTECTOMY   2008    mmh-right  . TEMPORARY PACEMAKER INSERTION N/A 10/13/2013   Procedure: TEMPORARY PACEMAKER INSERTION;  Surgeon: Jolaine Artist, MD;  Location: [facility] CATH LAB;  Service: Cardiovascular;  Laterality: N/A;  . TOTAL KNEE ARTHROPLASTY Left 11/04/2015   Procedure: LEFT TOTAL KNEE ARTHROPLASTY;  Surgeon: Rod Can, MD;  Location: [facility] ORS;  Service: Orthopedics;  Laterality: Left;     Family History  Problem Relation Age of Onset  . Hypertension Father   . Diabetes Father   . Anesthesia problems Neg Hx   . Hypotension Neg Hx   . Malignant hyperthermia Neg Hx   . Pseudochol deficiency Neg Hx      Social History   Socioeconomic History  . Marital status: Widowed    Spouse name: Not on file  . Number of children: Not on file  . Years of education: Not on file  . Highest education level: Not on file  Occupational History  . Not on file  Tobacco Use  . Smoking status: Never Smoker  . Smokeless tobacco: Never Used  Substance and Sexual Activity  . Alcohol use: No    Alcohol/week: 0.0 standard drinks  . Drug use: No  . Sexual activity: Yes    Birth control/protection: Post-menopausal  Other Topics Concern  . Not on file  Social History Narrative  . Not on file   Social Determinants of Health   Financial Resource Strain:   . Difficulty of Paying Living Expenses:   Food Insecurity:   . Worried About Charity fundraiser in the Last Year:   . Arboriculturist in the Last Year:   Transportation Needs:   . Film/video editor (Medical):   Marland Kitchen Lack of Transportation (Non-Medical):   Physical Activity:   . Days of Exercise per Week:   . Minutes of Exercise per Session:   Stress:   . Feeling of Stress :   Social Connections:   . Frequency of Communication with Friends and Family:   . Frequency of Social Gatherings with Friends and Family:   . Attends Religious Services:   . Active Member of Clubs or Organizations:   . Attends Archivist Meetings:   Marland Kitchen  Marital Status:   Intimate Partner Violence:   . Fear of Current or Ex-Partner:   . Emotionally Abused:   Marland Kitchen Physically Abused:   . Sexually Abused:      BP 122/64   Pulse 80   Temp 98.3 F (36.8 C)   Ht 5\' 3"  (1.6 m)   Wt 225 lb 6.4 oz (102.2 kg)   SpO2 95%   BMI 39.93 kg/m   Physical Exam:  Well appearing NAD HEENT: Unremarkable Neck:  No JVD, no thyromegally Lymphatics:  No adenopathy Back:  No CVA tenderness Lungs:  Clear with no wheezes HEART:  Regular rate rhythm, no murmurs, no rubs, no clicks Abd:  soft, positive bowel sounds, no organomegally, no rebound, no guarding Ext:  2 plus pulses, no edema, no cyanosis, no clubbing Skin:  No rashes no nodules Neuro:  CN II through XII intact, motor grossly intact   DEVICE  Normal device function.  See PaceArt for details.   Assess/Plan: 1. Chronic systolic heart failure - her EF remains normal after biv PPM. She will continue maximal medical therapy. 2. PPM - her St. Jude biv PPM is working normally with 2.6 years of battery longevity. 3. SVT - she has highly symptomatic but infrequent episodes. We will follow. I briefly discussed cathter ablation.  4. PAF - she will remain on eliquis. Her episodes are infrequent.   Mikle Bosworth.D.

## 2019-12-30 NOTE — Patient Instructions (Signed)
Medication Instructions:  Your physician recommends that you continue on your current medications as directed. Please refer to the Current Medication list given to you today.  *If you need a refill on your cardiac medications before your next appointment, please call your pharmacy*   Lab Work: NONE   If you have labs (blood work) drawn today and your tests are completely normal, you will receive your results only by: . MyChart Message (if you have MyChart) OR . A paper copy in the mail If you have any lab test that is abnormal or we need to change your treatment, we will call you to review the results.   Testing/Procedures: NONE    Follow-Up: At [facility], you and your health needs are our priority.  As part of our continuing mission to provide you with exceptional heart care, we have created designated Provider Care Teams.  These Care Teams include your primary Cardiologist (physician) and Advanced Practice Providers (APPs -  Physician Assistants and Nurse Practitioners) who all work together to provide you with the care you need, when you need it.  We recommend signing up for the patient portal called "MyChart".  Sign up information is provided on this After Visit Summary.  MyChart is used to connect with patients for Virtual Visits (Telemedicine).  Patients are able to view lab/test results, encounter notes, upcoming appointments, etc.  Non-urgent messages can be sent to your provider as well.   To learn more about what you can do with MyChart, go to [email or web address].    Your next appointment:   1 year(s)  The format for your next appointment:   In Person  Provider:   Gregg Taylor, MD   Other Instructions Thank you for choosing [facility]!    

## 2019-12-31 ENCOUNTER — Telehealth: Payer: Self-pay | Admitting: *Deleted

## 2019-12-31 DIAGNOSIS — Z6839 Body mass index (BMI) 39.0-39.9, adult: Secondary | ICD-10-CM | POA: Diagnosis not present

## 2019-12-31 DIAGNOSIS — I10 Essential (primary) hypertension: Secondary | ICD-10-CM | POA: Diagnosis not present

## 2019-12-31 DIAGNOSIS — N184 Chronic kidney disease, stage 4 (severe): Secondary | ICD-10-CM

## 2019-12-31 DIAGNOSIS — I442 Atrioventricular block, complete: Secondary | ICD-10-CM | POA: Diagnosis not present

## 2019-12-31 DIAGNOSIS — I5022 Chronic systolic (congestive) heart failure: Secondary | ICD-10-CM

## 2019-12-31 DIAGNOSIS — E1121 Type 2 diabetes mellitus with diabetic nephropathy: Secondary | ICD-10-CM | POA: Diagnosis not present

## 2019-12-31 DIAGNOSIS — I2699 Other pulmonary embolism without acute cor pulmonale: Secondary | ICD-10-CM | POA: Diagnosis not present

## 2019-12-31 DIAGNOSIS — N183 Chronic kidney disease, stage 3 unspecified: Secondary | ICD-10-CM | POA: Diagnosis not present

## 2019-12-31 NOTE — Telephone Encounter (Signed)
-----   Message from Herminio Commons, MD sent at 12/31/2019 12:21 PM EDT ----- Renal function has worsened. I would hold diuretics altogether and repeat BMET in one week. When is her appt with nephrology?

## 2020-01-01 DIAGNOSIS — Z7901 Long term (current) use of anticoagulants: Secondary | ICD-10-CM | POA: Diagnosis not present

## 2020-01-01 DIAGNOSIS — E785 Hyperlipidemia, unspecified: Secondary | ICD-10-CM | POA: Diagnosis not present

## 2020-01-01 DIAGNOSIS — Z950 Presence of cardiac pacemaker: Secondary | ICD-10-CM | POA: Diagnosis not present

## 2020-01-01 DIAGNOSIS — Z6839 Body mass index (BMI) 39.0-39.9, adult: Secondary | ICD-10-CM | POA: Diagnosis not present

## 2020-01-01 DIAGNOSIS — I4891 Unspecified atrial fibrillation: Secondary | ICD-10-CM | POA: Diagnosis not present

## 2020-01-01 DIAGNOSIS — I2699 Other pulmonary embolism without acute cor pulmonale: Secondary | ICD-10-CM | POA: Diagnosis not present

## 2020-01-01 DIAGNOSIS — I509 Heart failure, unspecified: Secondary | ICD-10-CM | POA: Diagnosis not present

## 2020-01-01 DIAGNOSIS — I110 Hypertensive heart disease with heart failure: Secondary | ICD-10-CM | POA: Diagnosis not present

## 2020-01-02 NOTE — Telephone Encounter (Signed)
Spoke with Cassandra Parker after she discussed with Dr. Quintin Alto and he is okay with patient holding lasix and daily weights.

## 2020-01-02 NOTE — Telephone Encounter (Signed)
Patient informed and says that her PCP also did lab work and she was told to decrease fluid pill to daily but that she should continue it. Also says she was told by her PCP to repeat lab work on Wednesday. Patient also says that she was advised by her PCP that she did not need to weigh everyday. Advised that our office recommends daily weights. Advised that her PCP office will be contacted to clarify who will manage her diuretic and lab work. Patient also says that she has not been contacted about her nephrology appointment. Will check on status of referral.

## 2020-01-02 NOTE — Telephone Encounter (Signed)
Patient informed and verbalized understanding of plan. 

## 2020-01-02 NOTE — Telephone Encounter (Signed)
Discussed the with nurse at [facility] office so that plan for diuretic is resolved and both offices are aware of plan. She will discuss with Dr. Quintin Alto and contact our office afterwards.

## 2020-01-07 ENCOUNTER — Other Ambulatory Visit: Payer: Self-pay | Admitting: Cardiovascular Disease

## 2020-01-09 ENCOUNTER — Other Ambulatory Visit: Payer: Self-pay

## 2020-01-09 ENCOUNTER — Other Ambulatory Visit (HOSPITAL_COMMUNITY)
Admission: RE | Admit: 2020-01-09 | Discharge: 2020-01-09 | Disposition: A | Discharge status: Home / Self Care | Payer: Medicare HMO | Source: Ambulatory Visit | Attending: Cardiovascular Disease | Admitting: Cardiovascular Disease

## 2020-01-09 DIAGNOSIS — I10 Essential (primary) hypertension: Secondary | ICD-10-CM | POA: Diagnosis not present

## 2020-01-09 DIAGNOSIS — I5022 Chronic systolic (congestive) heart failure: Secondary | ICD-10-CM | POA: Insufficient documentation

## 2020-01-09 DIAGNOSIS — N184 Chronic kidney disease, stage 4 (severe): Secondary | ICD-10-CM | POA: Insufficient documentation

## 2020-01-09 LAB — BASIC METABOLIC PANEL
Anion gap: 9 (ref 5–15)
BUN: 64 mg/dL — ABNORMAL HIGH (ref 8–23)
CO2: 20 mmol/L — ABNORMAL LOW (ref 22–32)
Calcium: 8.8 mg/dL — ABNORMAL LOW (ref 8.9–10.3)
Chloride: 113 mmol/L — ABNORMAL HIGH (ref 98–111)
Creatinine, Ser: 2.57 mg/dL — ABNORMAL HIGH (ref 0.44–1.00)
GFR calc Af Amer: 20 mL/min — ABNORMAL LOW (ref >60)
GFR calc non Af Amer: 17 mL/min — ABNORMAL LOW (ref >60)
Glucose, Bld: 153 mg/dL — ABNORMAL HIGH (ref 70–99)
Potassium: 4.2 mmol/L (ref 3.5–5.1)
Sodium: 142 mmol/L (ref 135–145)

## 2020-01-10 ENCOUNTER — Other Ambulatory Visit: Payer: Self-pay | Admitting: Cardiovascular Disease

## 2020-01-19 ENCOUNTER — Telehealth: Payer: Self-pay | Admitting: *Deleted

## 2020-01-19 NOTE — Telephone Encounter (Signed)
Cassandra Parker, [place]  8/67/6195 0:93 PM EDT    Patient notified and verbalized understanding. States that she will be seeing her old nephrologist (Dr. Theador Hawthorne) on 01/30/2020 & her pcp (Dr. Quintin Alto) on 01/26/2020. We are still waiting for [facility] to give her an appointment. I will continue to follow up on this as I faxed notes to them on 01/12/2020.    Herminio Commons, MD  01/12/2020 9:15 PM EDT    Take Lasix 40 mg for wt gain of 3 lbs in 24 hrs or 5 lbs in a week.    Cassandra Parker, [place]  2/67/1245 8:09 PM EDT    Patient notified. Copy to pcp. Call placed to [facility] to follow up on referral. Patient states that since stopping the Lasix her weight has steadily gone up. Weight on 4/9 was 222.8 and today weight was 227.4. Does have foot swelling, but does state she was at funeral over the weekend and was not able to keep her feet elevated much. Has been watching her salt intake.    Herminio Commons, MD  01/09/2020 2:24 PM EDT    Continue to hold diuretics and stop losartan. Forward a copy to PCP and patient's nephrologist. I would let nephrology manage diuretics. Has she seen them yet?

## 2020-01-22 DIAGNOSIS — I10 Essential (primary) hypertension: Secondary | ICD-10-CM | POA: Diagnosis not present

## 2020-01-22 DIAGNOSIS — N183 Chronic kidney disease, stage 3 unspecified: Secondary | ICD-10-CM | POA: Diagnosis not present

## 2020-01-22 DIAGNOSIS — I442 Atrioventricular block, complete: Secondary | ICD-10-CM | POA: Diagnosis not present

## 2020-01-22 DIAGNOSIS — Z6839 Body mass index (BMI) 39.0-39.9, adult: Secondary | ICD-10-CM | POA: Diagnosis not present

## 2020-01-22 DIAGNOSIS — I2699 Other pulmonary embolism without acute cor pulmonale: Secondary | ICD-10-CM | POA: Diagnosis not present

## 2020-01-26 DIAGNOSIS — E1121 Type 2 diabetes mellitus with diabetic nephropathy: Secondary | ICD-10-CM | POA: Diagnosis not present

## 2020-01-26 DIAGNOSIS — N184 Chronic kidney disease, stage 4 (severe): Secondary | ICD-10-CM | POA: Diagnosis not present

## 2020-01-26 DIAGNOSIS — J189 Pneumonia, unspecified organism: Secondary | ICD-10-CM | POA: Diagnosis not present

## 2020-01-26 DIAGNOSIS — I5033 Acute on chronic diastolic (congestive) heart failure: Secondary | ICD-10-CM | POA: Diagnosis not present

## 2020-01-26 DIAGNOSIS — E1165 Type 2 diabetes mellitus with hyperglycemia: Secondary | ICD-10-CM | POA: Diagnosis not present

## 2020-01-26 DIAGNOSIS — R0602 Shortness of breath: Secondary | ICD-10-CM | POA: Diagnosis not present

## 2020-01-26 DIAGNOSIS — I10 Essential (primary) hypertension: Secondary | ICD-10-CM | POA: Diagnosis not present

## 2020-01-26 DIAGNOSIS — Z6841 Body Mass Index (BMI) 40.0 and over, adult: Secondary | ICD-10-CM | POA: Diagnosis not present

## 2020-01-30 ENCOUNTER — Telehealth: Payer: Self-pay | Admitting: *Deleted

## 2020-01-30 NOTE — Telephone Encounter (Signed)
New patient appointment scheduled with [facility] (Dr. Donato Heinz) on Monday, Feb 09, 2020 in [place] location.

## 2020-02-09 DIAGNOSIS — E785 Hyperlipidemia, unspecified: Secondary | ICD-10-CM | POA: Diagnosis not present

## 2020-02-09 DIAGNOSIS — E1122 Type 2 diabetes mellitus with diabetic chronic kidney disease: Secondary | ICD-10-CM | POA: Diagnosis not present

## 2020-02-09 DIAGNOSIS — N2581 Secondary hyperparathyroidism of renal origin: Secondary | ICD-10-CM | POA: Diagnosis not present

## 2020-02-09 DIAGNOSIS — I2699 Other pulmonary embolism without acute cor pulmonale: Secondary | ICD-10-CM | POA: Diagnosis not present

## 2020-02-09 DIAGNOSIS — I129 Hypertensive chronic kidney disease with stage 1 through stage 4 chronic kidney disease, or unspecified chronic kidney disease: Secondary | ICD-10-CM | POA: Diagnosis not present

## 2020-02-09 DIAGNOSIS — D631 Anemia in chronic kidney disease: Secondary | ICD-10-CM | POA: Diagnosis not present

## 2020-02-09 DIAGNOSIS — R809 Proteinuria, unspecified: Secondary | ICD-10-CM | POA: Diagnosis not present

## 2020-02-09 DIAGNOSIS — I428 Other cardiomyopathies: Secondary | ICD-10-CM | POA: Diagnosis not present

## 2020-02-09 DIAGNOSIS — N184 Chronic kidney disease, stage 4 (severe): Secondary | ICD-10-CM | POA: Diagnosis not present

## 2020-02-12 DIAGNOSIS — N2581 Secondary hyperparathyroidism of renal origin: Secondary | ICD-10-CM | POA: Diagnosis not present

## 2020-02-12 DIAGNOSIS — N184 Chronic kidney disease, stage 4 (severe): Secondary | ICD-10-CM | POA: Diagnosis not present

## 2020-02-12 DIAGNOSIS — N189 Chronic kidney disease, unspecified: Secondary | ICD-10-CM | POA: Diagnosis not present

## 2020-02-12 DIAGNOSIS — R809 Proteinuria, unspecified: Secondary | ICD-10-CM | POA: Diagnosis not present

## 2020-02-23 DIAGNOSIS — E7849 Other hyperlipidemia: Secondary | ICD-10-CM | POA: Diagnosis not present

## 2020-02-23 DIAGNOSIS — N184 Chronic kidney disease, stage 4 (severe): Secondary | ICD-10-CM | POA: Diagnosis not present

## 2020-02-23 DIAGNOSIS — I129 Hypertensive chronic kidney disease with stage 1 through stage 4 chronic kidney disease, or unspecified chronic kidney disease: Secondary | ICD-10-CM | POA: Diagnosis not present

## 2020-02-23 DIAGNOSIS — E1165 Type 2 diabetes mellitus with hyperglycemia: Secondary | ICD-10-CM | POA: Diagnosis not present

## 2020-02-25 DIAGNOSIS — I110 Hypertensive heart disease with heart failure: Secondary | ICD-10-CM | POA: Diagnosis not present

## 2020-02-25 DIAGNOSIS — E785 Hyperlipidemia, unspecified: Secondary | ICD-10-CM | POA: Diagnosis not present

## 2020-02-25 DIAGNOSIS — I2699 Other pulmonary embolism without acute cor pulmonale: Secondary | ICD-10-CM | POA: Diagnosis not present

## 2020-02-25 DIAGNOSIS — I509 Heart failure, unspecified: Secondary | ICD-10-CM | POA: Diagnosis not present

## 2020-02-25 DIAGNOSIS — Z7901 Long term (current) use of anticoagulants: Secondary | ICD-10-CM | POA: Diagnosis not present

## 2020-02-25 DIAGNOSIS — I4891 Unspecified atrial fibrillation: Secondary | ICD-10-CM | POA: Diagnosis not present

## 2020-02-25 DIAGNOSIS — Z6841 Body Mass Index (BMI) 40.0 and over, adult: Secondary | ICD-10-CM | POA: Diagnosis not present

## 2020-03-17 ENCOUNTER — Other Ambulatory Visit: Payer: Self-pay | Admitting: Cardiovascular Disease

## 2020-03-22 DIAGNOSIS — N184 Chronic kidney disease, stage 4 (severe): Secondary | ICD-10-CM | POA: Diagnosis not present

## 2020-03-23 ENCOUNTER — Ambulatory Visit: Payer: Medicare HMO

## 2020-04-02 ENCOUNTER — Telehealth: Payer: Self-pay | Admitting: Internal Medicine

## 2020-04-02 NOTE — Telephone Encounter (Signed)
New Message  Patient is calling in to see if her transmission was received successfully. Please give a call back to confirm.

## 2020-04-02 NOTE — Telephone Encounter (Signed)
We tried to send a transmission. Transmission unsuccessful. I called [employer] tech support and she is sending her a new cell adapter. She also going to call the pt back to try to get the monitor to send.

## 2020-04-03 LAB — CUP PACEART REMOTE DEVICE CHECK
Battery Remaining Longevity: 30 mo
Battery Remaining Percentage: 38 %
Battery Voltage: 2.84 V
Brady Statistic AP VP Percent: 1 %
Brady Statistic AP VS Percent: 1 %
Brady Statistic AS VP Percent: 99 %
Brady Statistic AS VS Percent: 1 %
Brady Statistic RA Percent Paced: 1 %
Date Time Interrogation Session: 20210629020017
Implantable Lead Implant Date: 20150121
Implantable Lead Implant Date: 20150121
Implantable Lead Implant Date: 20150121
Implantable Lead Location: 753858
Implantable Lead Location: 753859
Implantable Lead Location: 753860
Implantable Pulse Generator Implant Date: 20150121
Lead Channel Impedance Value: 410 Ohm
Lead Channel Impedance Value: 410 Ohm
Lead Channel Impedance Value: 510 Ohm
Lead Channel Pacing Threshold Amplitude: 0.75 V
Lead Channel Pacing Threshold Amplitude: 0.75 V
Lead Channel Pacing Threshold Amplitude: 1.25 V
Lead Channel Pacing Threshold Pulse Width: 0.5 ms
Lead Channel Pacing Threshold Pulse Width: 0.5 ms
Lead Channel Pacing Threshold Pulse Width: 0.6 ms
Lead Channel Sensing Intrinsic Amplitude: 12 mV
Lead Channel Sensing Intrinsic Amplitude: 4.3 mV
Lead Channel Setting Pacing Amplitude: 2 V
Lead Channel Setting Pacing Amplitude: 2.25 V
Lead Channel Setting Pacing Amplitude: 2.5 V
Lead Channel Setting Pacing Pulse Width: 0.5 ms
Lead Channel Setting Pacing Pulse Width: 0.6 ms
Lead Channel Setting Sensing Sensitivity: 2 mV
Pulse Gen Model: 3242
Pulse Gen Serial Number: 7557114

## 2020-04-15 ENCOUNTER — Other Ambulatory Visit: Payer: Self-pay

## 2020-04-15 DIAGNOSIS — N289 Disorder of kidney and ureter, unspecified: Secondary | ICD-10-CM

## 2020-04-19 DIAGNOSIS — N184 Chronic kidney disease, stage 4 (severe): Secondary | ICD-10-CM | POA: Diagnosis not present

## 2020-04-23 NOTE — Telephone Encounter (Signed)
Manual transmission received 04/06/20, normal function no new issues.

## 2020-04-26 DIAGNOSIS — N2581 Secondary hyperparathyroidism of renal origin: Secondary | ICD-10-CM | POA: Diagnosis not present

## 2020-04-26 DIAGNOSIS — N184 Chronic kidney disease, stage 4 (severe): Secondary | ICD-10-CM | POA: Diagnosis not present

## 2020-04-26 DIAGNOSIS — I129 Hypertensive chronic kidney disease with stage 1 through stage 4 chronic kidney disease, or unspecified chronic kidney disease: Secondary | ICD-10-CM | POA: Diagnosis not present

## 2020-04-26 DIAGNOSIS — D631 Anemia in chronic kidney disease: Secondary | ICD-10-CM | POA: Diagnosis not present

## 2020-04-26 DIAGNOSIS — R809 Proteinuria, unspecified: Secondary | ICD-10-CM | POA: Diagnosis not present

## 2020-04-26 DIAGNOSIS — E1122 Type 2 diabetes mellitus with diabetic chronic kidney disease: Secondary | ICD-10-CM | POA: Diagnosis not present

## 2020-04-29 ENCOUNTER — Encounter: Payer: Self-pay | Admitting: Vascular Surgery

## 2020-04-29 ENCOUNTER — Ambulatory Visit (INDEPENDENT_AMBULATORY_CARE_PROVIDER_SITE_OTHER)
Admission: RE | Admit: 2020-04-29 | Discharge: 2020-04-29 | Disposition: A | Discharge status: Home / Self Care | Payer: Medicare HMO | Source: Ambulatory Visit | Attending: Vascular Surgery | Admitting: Vascular Surgery

## 2020-04-29 ENCOUNTER — Ambulatory Visit (HOSPITAL_COMMUNITY)
Admission: RE | Admit: 2020-04-29 | Discharge: 2020-04-29 | Disposition: A | Discharge status: Home / Self Care | Payer: Medicare HMO | Source: Ambulatory Visit | Attending: Vascular Surgery | Admitting: Vascular Surgery

## 2020-04-29 ENCOUNTER — Other Ambulatory Visit: Payer: Self-pay

## 2020-04-29 ENCOUNTER — Ambulatory Visit: Payer: Medicare HMO | Admitting: Vascular Surgery

## 2020-04-29 VITALS — BP 166/90 | HR 98 | Temp 98.0°F | Resp 20 | Ht 63.0 in | Wt 225.9 lb

## 2020-04-29 DIAGNOSIS — N184 Chronic kidney disease, stage 4 (severe): Secondary | ICD-10-CM

## 2020-04-29 DIAGNOSIS — N289 Disorder of kidney and ureter, unspecified: Secondary | ICD-10-CM

## 2020-04-29 NOTE — H&P (View-Only) (Signed)
Referring Physician: Dr. Arty Baumgartner  Patient name: Cassandra Parker MRN: [id number] DOB: 1941/01/21 Sex: female  REASON FOR CONSULT: Hemodialysis access  HPI: Cassandra Parker is a 79 y.o. female, referred for placement of hemodialysis access.  She currently is CKD4.  She is right-handed.  She has had a prior right mastectomy and presumably axillary node dissection.  She has a pacemaker on the left side.  She does not currently have any numbness or tingling in her hands.  Request was made to place an AV fistula at this time but defer an AV graft.  Other medical problems include presumed pulmonary embolus in March of this year currently on Eliquis.  She had an indeterminate VQ scan.  She could not have a CTA due to contrast interference with her renal function.  She did have a negative DVT ultrasound.  She also has a history of diabetes hyperlipidemia cardiomyopathy hypertension all of which are currently stable.  Past Medical History:  Diagnosis Date  . Acute renal insufficiency 10/15/2013  . Arthritis   . Cancer [facility]) 2008   right breast  . Cancer of breast- s/p chemo and Rt mastectomy 2008 10/13/2013  . Cardiomyopathy ([place])    dating back to 2010  . CHB (complete heart block) ([place]) 10/13/2013  . Chronic combined systolic and diastolic congestive heart failure ([place])   . Diabetes mellitus    with neuropathy  . Diabetes mellitus type 2, controlled, without complications ([place]) 2/59/5638  . Dyslipidemia 10/13/2013  . History of blood transfusion   . Hypercholesterolemia   . Hypertension   . Mitral stenosis- moderate with trivial MR  10/15/2013  . Pacemaker 10/23/2014  . Presence of permanent cardiac pacemaker   . Primary osteoarthritis of left knee 11/04/2015  . Syncope 10/13/2013  . Tingling    feet bilat    Past Surgical History:  Procedure Laterality Date  . APPENDECTOMY     mmh  . BI-VENTRICULAR PACEMAKER INSERTION N/A 10/15/2013   Procedure: BI-VENTRICULAR PACEMAKER INSERTION  (CRT-P);  Surgeon: Evans Lance, MD;  Location: [facility] CATH LAB;  Service: Cardiovascular;  Laterality: N/A;  . Cardiomyopathy    . CATARACT EXTRACTION W/PHACO  06/29/2011   Procedure: CATARACT EXTRACTION PHACO AND INTRAOCULAR LENS PLACEMENT (IOC);  Surgeon: Tonny Branch;  Location: [facility];  Service: Ophthalmology;  Laterality: Right;  CDE: 14.41  . CATARACT EXTRACTION W/PHACO  09/04/2011   Procedure: CATARACT EXTRACTION PHACO AND INTRAOCULAR LENS PLACEMENT (IOC);  Surgeon: Tonny Branch;  Location: [facility] ORS;  Service: Ophthalmology;  Laterality: Left;  CDE: 15.21  . CHOLECYSTECTOMY     mmh  . KNEE ARTHROSCOPY     right-mmh  . MASTECTOMY  2008    mmh-right  . TEMPORARY PACEMAKER INSERTION N/A 10/13/2013   Procedure: TEMPORARY PACEMAKER INSERTION;  Surgeon: Jolaine Artist, MD;  Location: [facility] CATH LAB;  Service: Cardiovascular;  Laterality: N/A;  . TOTAL KNEE ARTHROPLASTY Left 11/04/2015   Procedure: LEFT TOTAL KNEE ARTHROPLASTY;  Surgeon: Rod Can, MD;  Location: [facility] ORS;  Service: Orthopedics;  Laterality: Left;    Family History  Problem Relation Age of Onset  . Hypertension Father   . Diabetes Father   . Anesthesia problems Neg Hx   . Hypotension Neg Hx   . Malignant hyperthermia Neg Hx   . Pseudochol deficiency Neg Hx     SOCIAL HISTORY: Social History   Socioeconomic History  . Marital status: Widowed    Spouse name: Not on file  . Number of  children: Not on file  . Years of education: Not on file  . Highest education level: Not on file  Occupational History  . Not on file  Tobacco Use  . Smoking status: Never Smoker  . Smokeless tobacco: Never Used  Vaping Use  . Vaping Use: Never used  Substance and Sexual Activity  . Alcohol use: No    Alcohol/week: 0.0 standard drinks  . Drug use: No  . Sexual activity: Yes    Birth control/protection: Post-menopausal  Other Topics Concern  . Not on file  Social History Narrative  . Not on file   Social Determinants of Health    Financial Resource Strain:   . Difficulty of Paying Living Expenses:   Food Insecurity:   . Worried About Charity fundraiser in the Last Year:   . Arboriculturist in the Last Year:   Transportation Needs:   . Film/video editor (Medical):   Marland Kitchen Lack of Transportation (Non-Medical):   Physical Activity:   . Days of Exercise per Week:   . Minutes of Exercise per Session:   Stress:   . Feeling of Stress :   Social Connections:   . Frequency of Communication with Friends and Family:   . Frequency of Social Gatherings with Friends and Family:   . Attends Religious Services:   . Active Member of Clubs or Organizations:   . Attends Archivist Meetings:   Marland Kitchen Marital Status:   Intimate Partner Violence:   . Fear of Current or Ex-Partner:   . Emotionally Abused:   Marland Kitchen Physically Abused:   . Sexually Abused:     No Known Allergies  Current Outpatient Medications  Medication Sig Dispense Refill  . acetaminophen (TYLENOL) 325 MG tablet Take 2 tablets (650 mg total) by mouth every 6 (six) hours as needed for mild pain or headache (or Fever >/= 101). 30 tablet 2  . amLODipine (NORVASC) 5 MG tablet Take 1 tablet (5 mg total) by mouth daily. For BP 30 tablet 11  . apixaban (ELIQUIS) 5 MG TABS tablet Take 1 tablet (5 mg total) by mouth 2 (two) times daily. Start around April 30th after completing initial starter pack 60 tablet 4  . carvedilol (COREG) 3.125 MG tablet Take 1 tablet (3.125 mg total) by mouth 2 (two) times daily. 60 tablet 2  . Cholecalciferol (VITAMIN D) 2000 UNITS tablet Take 2,000 Units by mouth daily.     . furosemide (LASIX) 40 MG tablet TAKE 1 TABLET EVERY DAY FOR 5 DAYS, THEN EVERY DAY AS NEEDED FOR WEIGHT GAIN OF 3LBS IN A DAY OR 5LBS IN A WEEK 90 tablet 1  . glipiZIDE (GLUCOTROL XL) 5 MG 24 hr tablet Take 5 mg by mouth in the morning and at bedtime.     . hydrALAZINE (APRESOLINE) 50 MG tablet TAKE 1 TABLET (50 MG TOTAL) BY MOUTH 3 (THREE) TIMES DAILY. 270  tablet 1  . losartan (COZAAR) 50 MG tablet Take 1 tablet (50 mg total) by mouth daily. 30 tablet 4  . pioglitazone (ACTOS) 15 MG tablet     . potassium chloride (KLOR-CON) 10 MEQ tablet Take 1 tablet (10 mEq total) by mouth daily as needed. 90 tablet 2  . pravastatin (PRAVACHOL) 10 MG tablet Take 10 mg by mouth at bedtime.     . vitamin B-12 (CYANOCOBALAMIN) 1000 MCG tablet Take 1,000 mcg by mouth daily.     No current facility-administered medications for this visit.  ROS:   General:  No weight loss, Fever, chills  HEENT: No recent headaches, no nasal bleeding, no visual changes, no sore throat  Neurologic: No dizziness, blackouts, seizures. No recent symptoms of stroke or mini- stroke. No recent episodes of slurred speech, or temporary blindness.  Cardiac: No recent episodes of chest pain/pressure, no shortness of breath at rest.  No shortness of breath with exertion.  Denies history of atrial fibrillation or irregular heartbeat  Vascular: No history of rest pain in feet.  No history of claudication.  No history of non-healing ulcer, No history of DVT   Pulmonary: No home oxygen, no productive cough, no hemoptysis,  No asthma or wheezing  Musculoskeletal:  [ ]  Arthritis, [ ]  Low back pain,  [ ]  Joint pain  Hematologic:No history of hypercoagulable state.  No history of easy bleeding.  No history of anemia  Gastrointestinal: No hematochezia or melena,  No gastroesophageal reflux, no trouble swallowing  Urinary: [X]  chronic Kidney disease, [ ]  on HD - [ ]  MWF or [ ]  TTHS, [ ]  Burning with urination, [ ]  Frequent urination, [ ]  Difficulty urinating;   Skin: No rashes  Psychological: No history of anxiety,  No history of depression   Physical Examination  Vitals:   04/29/20 1047  BP: (!) 166/90  Pulse: 98  Resp: 20  Temp: 98 F (36.7 C)  SpO2: 95%  Weight: 225 lb 14.4 oz (102.5 kg)  Height: 5\' 3"  (1.6 m)    Body mass index is 40.02 kg/m.  General:  Alert and  oriented, no acute distress HEENT: Normal Neck: No JVD Cardiac: Regular Rate and Rhythm Skin: No rash, scar in the right axilla consistent with either an episode of prior hidradenitis or axillary dissection. Extremity Pulses:  2+ radial, brachial pulses bilaterally Musculoskeletal: No deformity or edema  Neurologic: Upper and lower extremity motor 5/5 and symmetric  DATA:  Patient had an upper extremity arterial duplex exam today which I reviewed and interpreted.  This was essentially normal.  Brachial artery diameter was 5 mm radial 2 mm  She also had bilateral upper extremity vein mapping today.  The cephalic vein on the left side was less than 2 mm.  The left basilic vein was 6-5/5 mm at the antecubital crease but then up to 5 mm in the upper arm.  On the right side the basilic vein was similar.  The right cephalic vein was 5 to 6 mm in the upper arm.  It was small in the forearm.  ASSESSMENT: CKD4 needs hemodialysis access.  Best option would be to go into the right arm in light of the fact that she has a pacemaker on the left side.  She does have a scar in the right axilla from her prior mastectomy and most likely axillary dissection so she may be at some increased risk of swelling in the right arm.  However, the left arm certainly is high risk with the pacemaker present.   PLAN: Patient will be scheduled for right brachiocephalic AV fistula May 11, 2020.  Risk benefits possible complications of procedure details including but not limited to nonmaturation ischemic steal requirement for other possible procedures discussed with the patient and her daughter today.  Understand and agree to proceed.  We will stop her Eliquis 3 days prior to the procedure.  She was placed on Eliquis for presumed pulmonary embolus with an indeterminate VQ scan.  She has now been treated for 5 months and I believe that should  be relatively safe not to bridge her.  This was also discussed with the patient today.  I  will leave it at the discretion of Dr. Quintin Alto her primary care physician to determine when to end her anticoagulation therapy.   Ruta Hinds, MD [facility] Office: [phone]

## 2020-04-29 NOTE — Progress Notes (Deleted)
Referring Physician: ***  Patient name: Cassandra Parker MRN: [id number] DOB: 1941/09/17 Sex: female  REASON FOR CONSULT: ***  HPI: Cassandra Parker is a 79 y.o. female,  *** Other medical problems include   Past Medical History:  Diagnosis Date  . Acute renal insufficiency 10/15/2013  . Arthritis   . Cancer [facility]) 2008   right breast  . Cancer of breast- s/p chemo and Rt mastectomy 2008 10/13/2013  . Cardiomyopathy ([place])    dating back to 2010  . CHB (complete heart block) ([place]) 10/13/2013  . Chronic combined systolic and diastolic congestive heart failure ([place])   . Diabetes mellitus    with neuropathy  . Diabetes mellitus type 2, controlled, without complications ([place]) 7/82/9562  . Dyslipidemia 10/13/2013  . History of blood transfusion   . Hypercholesterolemia   . Hypertension   . Mitral stenosis- moderate with trivial MR  10/15/2013  . Pacemaker 10/23/2014  . Presence of permanent cardiac pacemaker   . Primary osteoarthritis of left knee 11/04/2015  . Syncope 10/13/2013  . Tingling    feet bilat    Past Surgical History:  Procedure Laterality Date  . APPENDECTOMY     mmh  . BI-VENTRICULAR PACEMAKER INSERTION N/A 10/15/2013   Procedure: BI-VENTRICULAR PACEMAKER INSERTION (CRT-P);  Surgeon: Evans Lance, MD;  Location[facility] CATH LAB;  Service: Cardiovascular;  Laterality: N/A;  . Cardiomyopathy    . CATARACT EXTRACTION W/PHACO  06/29/2011   Procedure: CATARACT EXTRACTION PHACO AND INTRAOCULAR LENS PLACEMENT (IOC);  Surgeon: Tonny Branch;  Location: [facility]S;  Service: Ophthalmology;  Laterality: Right;  CDE: 14.41  . CATARACT EXTRACTION W/PHACO  09/04/2011   Procedure: CATARACT EXTRACTION PHACO AND INTRAOCULAR LENS PLACEMENT (IOC);  Surgeon: Tonny Branch;  Location: [facility] ORS;  Service: Ophthalmology;  Laterality: Left;  CDE: 15.21  . CHOLECYSTECTOMY     mmh  . KNEE ARTHROSCOPY     right-mmh  . MASTECTOMY  2008    mmh-right  . TEMPORARY PACEMAKER INSERTION N/A 10/13/2013    Procedure: TEMPORARY PACEMAKER INSERTION;  Surgeon: Jolaine Artist, MD;  Location: [facility] CATH LAB;  Service: Cardiovascular;  Laterality: N/A;  . TOTAL KNEE ARTHROPLASTY Left 11/04/2015   Procedure: LEFT TOTAL KNEE ARTHROPLASTY;  Surgeon: Rod Can, MD;  Location: [facility] ORS;  Service: Orthopedics;  Laterality: Left;    Family History  Problem Relation Age of Onset  . Hypertension Father   . Diabetes Father   . Anesthesia problems Neg Hx   . Hypotension Neg Hx   . Malignant hyperthermia Neg Hx   . Pseudochol deficiency Neg Hx     SOCIAL HISTORY: Social History   Socioeconomic History  . Marital status: Widowed    Spouse name: Not on file  . Number of children: Not on file  . Years of education: Not on file  . Highest education level: Not on file  Occupational History  . Not on file  Tobacco Use  . Smoking status: Never Smoker  . Smokeless tobacco: Never Used  Vaping Use  . Vaping Use: Never used  Substance and Sexual Activity  . Alcohol use: No    Alcohol/week: 0.0 standard drinks  . Drug use: No  . Sexual activity: Yes    Birth control/protection: Post-menopausal  Other Topics Concern  . Not on file  Social History Narrative  . Not on file   Social Determinants of Health   Financial Resource Strain:   . Difficulty of Paying Living Expenses:   Food  Insecurity:   . Worried About Charity fundraiser in the Last Year:   . Arboriculturist in the Last Year:   Transportation Needs:   . Film/video editor (Medical):   Marland Kitchen Lack of Transportation (Non-Medical):   Physical Activity:   . Days of Exercise per Week:   . Minutes of Exercise per Session:   Stress:   . Feeling of Stress :   Social Connections:   . Frequency of Communication with Friends and Family:   . Frequency of Social Gatherings with Friends and Family:   . Attends Religious Services:   . Active Member of Clubs or Organizations:   . Attends Archivist Meetings:   Marland Kitchen Marital Status:     Intimate Partner Violence:   . Fear of Current or Ex-Partner:   . Emotionally Abused:   Marland Kitchen Physically Abused:   . Sexually Abused:     No Known Allergies  Current Outpatient Medications  Medication Sig Dispense Refill  . acetaminophen (TYLENOL) 325 MG tablet Take 2 tablets (650 mg total) by mouth every 6 (six) hours as needed for mild pain or headache (or Fever >/= 101). 30 tablet 2  . amLODipine (NORVASC) 5 MG tablet Take 1 tablet (5 mg total) by mouth daily. For BP 30 tablet 11  . apixaban (ELIQUIS) 5 MG TABS tablet Take 1 tablet (5 mg total) by mouth 2 (two) times daily. Start around April 30th after completing initial starter pack 60 tablet 4  . carvedilol (COREG) 3.125 MG tablet Take 1 tablet (3.125 mg total) by mouth 2 (two) times daily. 60 tablet 2  . Cholecalciferol (VITAMIN D) 2000 UNITS tablet Take 2,000 Units by mouth daily.     . furosemide (LASIX) 40 MG tablet TAKE 1 TABLET EVERY DAY FOR 5 DAYS, THEN EVERY DAY AS NEEDED FOR WEIGHT GAIN OF 3LBS IN A DAY OR 5LBS IN A WEEK 90 tablet 1  . glipiZIDE (GLUCOTROL XL) 5 MG 24 hr tablet Take 5 mg by mouth in the morning and at bedtime.     . hydrALAZINE (APRESOLINE) 50 MG tablet TAKE 1 TABLET (50 MG TOTAL) BY MOUTH 3 (THREE) TIMES DAILY. 270 tablet 1  . losartan (COZAAR) 50 MG tablet Take 1 tablet (50 mg total) by mouth daily. 30 tablet 4  . pioglitazone (ACTOS) 15 MG tablet     . potassium chloride (KLOR-CON) 10 MEQ tablet Take 1 tablet (10 mEq total) by mouth daily as needed. 90 tablet 2  . pravastatin (PRAVACHOL) 10 MG tablet Take 10 mg by mouth at bedtime.     . vitamin B-12 (CYANOCOBALAMIN) 1000 MCG tablet Take 1,000 mcg by mouth daily.     No current facility-administered medications for this visit.    ROS:   General:  No weight loss, Fever, chills  HEENT: No recent headaches, no nasal bleeding, no visual changes, no sore throat  Neurologic: No dizziness, blackouts, seizures. No recent symptoms of stroke or mini- stroke.  No recent episodes of slurred speech, or temporary blindness.  Cardiac: No recent episodes of chest pain/pressure, no shortness of breath at rest.  No shortness of breath with exertion.  Denies history of atrial fibrillation or irregular heartbeat  Vascular: No history of rest pain in feet.  No history of claudication.  No history of non-healing ulcer, No history of DVT   Pulmonary: No home oxygen, no productive cough, no hemoptysis,  No asthma or wheezing  Musculoskeletal:  [{Blank single:19197::" ","X"}] Arthritis, [{  Blank OLMBEM:75449::" ","X"}] Low back pain,  [{Blank single:19197::" ","X"}] Joint pain  Hematologic:No history of hypercoagulable state.  No history of easy bleeding.  No history of anemia  Gastrointestinal: No hematochezia or melena,  No gastroesophageal reflux, no trouble swallowing  Urinary: [{Blank single:19197::" ","X"}] chronic Kidney disease, [{Blank single:19197::" ","X"}] on HD - [{Blank single:19197::" ","X"}] MWF or [{Blank single:19197::" ","X"}] TTHS, [ ]  Burning with urination, [ ]  Frequent urination, [ ]  Difficulty urinating;   Skin: No rashes  Psychological: No history of anxiety,  No history of depression   Physical Examination  Vitals:   04/29/20 1047  BP: (!) 166/90  Pulse: 98  Resp: 20  Temp: 98 F (36.7 C)  SpO2: 95%  Weight: 225 lb 14.4 oz (102.5 kg)  Height: 5\' 3"  (1.6 m)    Body mass index is 40.02 kg/m.  General:  Alert and oriented, no acute distress HEENT: Normal Neck: No bruit or JVD Pulmonary: Clear to auscultation bilaterally Cardiac: Regular Rate and Rhythm without murmur Abdomen: Soft, non-tender, non-distended, no mass, no scars Skin: No rash Extremity Pulses:  2+ radial, brachial, femoral, dorsalis pedis, posterior tibial pulses bilaterally Musculoskeletal: No deformity or edema  Neurologic: Upper and lower extremity motor 5/5 and symmetric  DATA:  ***  ASSESSMENT:  ***   PLAN:  ***   Ruta Hinds,  MD [facility] [place] Office: [phone]

## 2020-04-29 NOTE — Progress Notes (Signed)
Referring Physician: Dr. Arty Baumgartner  Patient name: Cassandra Parker MRN: [id number] DOB: 09/25/1941 Sex: female  REASON FOR CONSULT: Hemodialysis access  HPI: [name] is a 79 y.o. female, referred for placement of hemodialysis access.  She currently is CKD4.  She is right-handed.  She has had a prior right mastectomy and presumably axillary node dissection.  She has a pacemaker on the left side.  She does not currently have any numbness or tingling in her hands.  Request was made to place an AV fistula at this time but defer an AV graft.  Other medical problems include presumed pulmonary embolus in March of this year currently on Eliquis.  She had an indeterminate VQ scan.  She could not have a CTA due to contrast interference with her renal function.  She did have a negative DVT ultrasound.  She also has a history of diabetes hyperlipidemia cardiomyopathy hypertension all of which are currently stable.  Past Medical History:  Diagnosis Date  . Acute renal insufficiency 10/15/2013  . Arthritis   . Cancer [facility]) 2008   right breast  . Cancer of breast- s/p chemo and Rt mastectomy 2008 10/13/2013  . Cardiomyopathy ([place])    dating back to 2010  . CHB (complete heart block) ([place]) 10/13/2013  . Chronic combined systolic and diastolic congestive heart failure ([place])   . Diabetes mellitus    with neuropathy  . Diabetes mellitus type 2, controlled, without complications ([place]) 9/62/2297  . Dyslipidemia 10/13/2013  . History of blood transfusion   . Hypercholesterolemia   . Hypertension   . Mitral stenosis- moderate with trivial MR  10/15/2013  . Pacemaker 10/23/2014  . Presence of permanent cardiac pacemaker   . Primary osteoarthritis of left knee 11/04/2015  . Syncope 10/13/2013  . Tingling    feet bilat    Past Surgical History:  Procedure Laterality Date  . APPENDECTOMY     mmh  . BI-VENTRICULAR PACEMAKER INSERTION N/A 10/15/2013   Procedure: BI-VENTRICULAR PACEMAKER INSERTION  (CRT-P);  Surgeon: Evans Lance, MD;  Location: [facility] CATH LAB;  Service: Cardiovascular;  Laterality: N/A;  . Cardiomyopathy    . CATARACT EXTRACTION W/PHACO  06/29/2011   Procedure: CATARACT EXTRACTION PHACO AND INTRAOCULAR LENS PLACEMENT (IOC);  Surgeon: Tonny Branch;  Location: [facility] ORS;  Service: Ophthalmology;  Laterality: Right;  CDE: 14.41  . CATARACT EXTRACTION W/PHACO  09/04/2011   Procedure: CATARACT EXTRACTION PHACO AND INTRAOCULAR LENS PLACEMENT (IOC);  Surgeon: Tonny Branch;  Location: [facility] ORS;  Service: Ophthalmology;  Laterality: Left;  CDE: 15.21  . CHOLECYSTECTOMY     mmh  . KNEE ARTHROSCOPY     right-mmh  . MASTECTOMY  2008    mmh-right  . TEMPORARY PACEMAKER INSERTION N/A 10/13/2013   Procedure: TEMPORARY PACEMAKER INSERTION;  Surgeon: Jolaine Artist, MD;  Location: [facility] CATH LAB;  Service: Cardiovascular;  Laterality: N/A;  . TOTAL KNEE ARTHROPLASTY Left 11/04/2015   Procedure: LEFT TOTAL KNEE ARTHROPLASTY;  Surgeon: Rod Can, MD;  [facility] ORS;  Service: Orthopedics;  Laterality: Left;    Family History  Problem Relation Age of Onset  . Hypertension Father   . Diabetes Father   . Anesthesia problems Neg Hx   . Hypotension Neg Hx   . Malignant hyperthermia Neg Hx   . Pseudochol deficiency Neg Hx     SOCIAL HISTORY: Social History   Socioeconomic History  . Marital status: Widowed    Spouse name: Not on file  . Number of  children: Not on file  . Years of education: Not on file  . Highest education level: Not on file  Occupational History  . Not on file  Tobacco Use  . Smoking status: Never Smoker  . Smokeless tobacco: Never Used  Vaping Use  . Vaping Use: Never used  Substance and Sexual Activity  . Alcohol use: No    Alcohol/week: 0.0 standard drinks  . Drug use: No  . Sexual activity: Yes    Birth control/protection: Post-menopausal  Other Topics Concern  . Not on file  Social History Narrative  . Not on file   Social Determinants of Health    Financial Resource Strain:   . Difficulty of Paying Living Expenses:   Food Insecurity:   . Worried About Charity fundraiser in the Last Year:   . Arboriculturist in the Last Year:   Transportation Needs:   . Film/video editor (Medical):   Marland Kitchen Lack of Transportation (Non-Medical):   Physical Activity:   . Days of Exercise per Week:   . Minutes of Exercise per Session:   Stress:   . Feeling of Stress :   Social Connections:   . Frequency of Communication with Friends and Family:   . Frequency of Social Gatherings with Friends and Family:   . Attends Religious Services:   . Active Member of Clubs or Organizations:   . Attends Archivist Meetings:   Marland Kitchen Marital Status:   Intimate Partner Violence:   . Fear of Current or Ex-Partner:   . Emotionally Abused:   Marland Kitchen Physically Abused:   . Sexually Abused:     No Known Allergies  Current Outpatient Medications  Medication Sig Dispense Refill  . acetaminophen (TYLENOL) 325 MG tablet Take 2 tablets (650 mg total) by mouth every 6 (six) hours as needed for mild pain or headache (or Fever >/= 101). 30 tablet 2  . amLODipine (NORVASC) 5 MG tablet Take 1 tablet (5 mg total) by mouth daily. For BP 30 tablet 11  . apixaban (ELIQUIS) 5 MG TABS tablet Take 1 tablet (5 mg total) by mouth 2 (two) times daily. Start around April 30th after completing initial starter pack 60 tablet 4  . carvedilol (COREG) 3.125 MG tablet Take 1 tablet (3.125 mg total) by mouth 2 (two) times daily. 60 tablet 2  . Cholecalciferol (VITAMIN D) 2000 UNITS tablet Take 2,000 Units by mouth daily.     . furosemide (LASIX) 40 MG tablet TAKE 1 TABLET EVERY DAY FOR 5 DAYS, THEN EVERY DAY AS NEEDED FOR WEIGHT GAIN OF 3LBS IN A DAY OR 5LBS IN A WEEK 90 tablet 1  . glipiZIDE (GLUCOTROL XL) 5 MG 24 hr tablet Take 5 mg by mouth in the morning and at bedtime.     . hydrALAZINE (APRESOLINE) 50 MG tablet TAKE 1 TABLET (50 MG TOTAL) BY MOUTH 3 (THREE) TIMES DAILY. 270  tablet 1  . losartan (COZAAR) 50 MG tablet Take 1 tablet (50 mg total) by mouth daily. 30 tablet 4  . pioglitazone (ACTOS) 15 MG tablet     . potassium chloride (KLOR-CON) 10 MEQ tablet Take 1 tablet (10 mEq total) by mouth daily as needed. 90 tablet 2  . pravastatin (PRAVACHOL) 10 MG tablet Take 10 mg by mouth at bedtime.     . vitamin B-12 (CYANOCOBALAMIN) 1000 MCG tablet Take 1,000 mcg by mouth daily.     No current facility-administered medications for this visit.  ROS:   General:  No weight loss, Fever, chills  HEENT: No recent headaches, no nasal bleeding, no visual changes, no sore throat  Neurologic: No dizziness, blackouts, seizures. No recent symptoms of stroke or mini- stroke. No recent episodes of slurred speech, or temporary blindness.  Cardiac: No recent episodes of chest pain/pressure, no shortness of breath at rest.  No shortness of breath with exertion.  Denies history of atrial fibrillation or irregular heartbeat  Vascular: No history of rest pain in feet.  No history of claudication.  No history of non-healing ulcer, No history of DVT   Pulmonary: No home oxygen, no productive cough, no hemoptysis,  No asthma or wheezing  Musculoskeletal:  [ ]  Arthritis, [ ]  Low back pain,  [ ]  Joint pain  Hematologic:No history of hypercoagulable state.  No history of easy bleeding.  No history of anemia  Gastrointestinal: No hematochezia or melena,  No gastroesophageal reflux, no trouble swallowing  Urinary: [X]  chronic Kidney disease, [ ]  on HD - [ ]  MWF or [ ]  TTHS, [ ]  Burning with urination, [ ]  Frequent urination, [ ]  Difficulty urinating;   Skin: No rashes  Psychological: No history of anxiety,  No history of depression   Physical Examination  Vitals:   04/29/20 1047  BP: (!) 166/90  Pulse: 98  Resp: 20  Temp: 98 F (36.7 C)  SpO2: 95%  Weight: 225 lb 14.4 oz (102.5 kg)  Height: 5\' 3"  (1.6 m)    Body mass index is 40.02 kg/m.  General:  Alert and  oriented, no acute distress HEENT: Normal Neck: No JVD Cardiac: Regular Rate and Rhythm Skin: No rash, scar in the right axilla consistent with either an episode of prior hidradenitis or axillary dissection. Extremity Pulses:  2+ radial, brachial pulses bilaterally Musculoskeletal: No deformity or edema  Neurologic: Upper and lower extremity motor 5/5 and symmetric  DATA:  Patient had an upper extremity arterial duplex exam today which I reviewed and interpreted.  This was essentially normal.  Brachial artery diameter was 5 mm radial 2 mm  She also had bilateral upper extremity vein mapping today.  The cephalic vein on the left side was less than 2 mm.  The left basilic vein was 5-6/3 mm at the antecubital crease but then up to 5 mm in the upper arm.  On the right side the basilic vein was similar.  The right cephalic vein was 5 to 6 mm in the upper arm.  It was small in the forearm.  ASSESSMENT: CKD4 needs hemodialysis access.  Best option would be to go into the right arm in light of the fact that she has a pacemaker on the left side.  She does have a scar in the right axilla from her prior mastectomy and most likely axillary dissection so she may be at some increased risk of swelling in the right arm.  However, the left arm certainly is high risk with the pacemaker present.   PLAN: Patient will be scheduled for right brachiocephalic AV fistula May 11, 2020.  Risk benefits possible complications of procedure details including but not limited to nonmaturation ischemic steal requirement for other possible procedures discussed with the patient and her daughter today.  Understand and agree to proceed.  We will stop her Eliquis 3 days prior to the procedure.  She was placed on Eliquis for presumed pulmonary embolus with an indeterminate VQ scan.  She has now been treated for 5 months and I believe that should  be relatively safe not to bridge her.  This was also discussed with the patient today.  I  will leave it at the discretion of Dr. Quintin Alto her primary care physician to determine when to end her anticoagulation therapy.   Ruta Hinds, MD [facility] Office: [phone]

## 2020-05-07 ENCOUNTER — Ambulatory Visit: Payer: Medicare HMO | Admitting: Cardiovascular Disease

## 2020-05-13 NOTE — Progress Notes (Signed)
Cardiology Office Note  Date: 05/14/2020   ID: Cassandra Parker, [name] 24-Apr-1941, MRN [id number]  PCP:  Manon Hilding, MD  Cardiologist:  No primary care provider on file. Electrophysiologist:  Cristopher Peru, MD   Chief Complaint: Chronic systolic HF  History of Present Illness: Cassandra Parker is a 79 y.o. female with a history of chronic systolic heart failure, nonischemic cardiomyopathy, LBBB, status post BiV PPM insertion with normalization of LV function, CKD stage IV, pending HD vascular access, presumed PE, DM2, HLD, HTN,   Last visit with Dr. Lovena Le on 12/30/2019.  Having occasional episodes of atrial arrhythmias.  Recent ER visit with episode of SVT.  She had converted back to NSR before EMS arrived. Recent repeat echocardiogram showed preserved LV function. She was to continue maximal medical therapy for chronic systolic heart failure.  [employer] BiV pacemaker was working normally with 2.6 years of battery life.  Highly symptomatic but infrequent episodes of SVT.  Catheter ablation was discussed.  She was remaining on Eliquis for her PAF.  She was having infrequent episodes  Recent visit with Dr Oneida Alar Vascular surgery for HD access CKD 4. Plan for R brachiocephalic AV fistula August 17,2021. Planned to stop Eliquis 3 days prior to procedure. She was placed on Eliquis for presumed PE in March 2021.   She is here for 54-month follow-up today.  She denies any recent anginal or exertional symptoms, palpitations or arrhythmias, orthostatic symptoms, stroke or TIA-like symptoms, dyspeptic symptoms, blood in stool or urine PND, orthopnea.  Does have some chronic lower extremity edema.  Blood pressure has been up recently.  Her PCP just recently increased her amlodipine to 10 mg daily.  States she just started the new dose 2 days ago.  Blood pressure 152/74 today.  She has a pending surgery August 17 with Dr. Oneida Alar for AV fistula infection and anticipation of dialysis for stage IV/V  CKD.  Biventricular pacemaker recently saw Dr. Lovena Le.  Device is working normally and she had 2.6 years of battery life.   Past Medical History:  Diagnosis Date  . Acute renal insufficiency 10/15/2013  . Arthritis   . Cancer [facility]) 2008   right breast  . Cancer of breast- s/p chemo and Rt mastectomy 2008 10/13/2013  . Cardiomyopathy ([place])    dating back to 2010  . CHB (complete heart block) ([place]) 10/13/2013  . Chronic combined systolic and diastolic congestive heart failure ([place])   . Diabetes mellitus    with neuropathy  . Diabetes mellitus type 2, controlled, without complications ([place]) 2/87/6811  . Dyslipidemia 10/13/2013  . History of blood transfusion   . Hypercholesterolemia   . Hypertension   . Mitral stenosis- moderate with trivial MR  10/15/2013  . Pacemaker 10/23/2014  . Presence of permanent cardiac pacemaker   . Primary osteoarthritis of left knee 11/04/2015  . Syncope 10/13/2013  . Tingling    feet bilat     Past Surgical History:  Procedure Laterality Date  . APPENDECTOMY     mmh  . BI-VENTRICULAR PACEMAKER INSERTION N/A 10/15/2013   Procedure: BI-VENTRICULAR PACEMAKER INSERTION (CRT-P);  Surgeon: Evans Lance, MD;  Location: [facility] CATH LAB;  Service: Cardiovascular;  Laterality: N/A;  . Cardiomyopathy    . CATARACT EXTRACTION W/PHACO  06/29/2011   Procedure: CATARACT EXTRACTION PHACO AND INTRAOCULAR LENS PLACEMENT (IOC);  Surgeon: Tonny Branch;  Location: [facility] ORS;  Service: Ophthalmology;  Laterality: Right;  CDE: 14.41  . CATARACT EXTRACTION W/PHACO  09/04/2011  Procedure: CATARACT EXTRACTION PHACO AND INTRAOCULAR LENS PLACEMENT (IOC);  Surgeon: Tonny Branch;  Location: [facility] ORS;  Service: Ophthalmology;  Laterality: Left;  CDE: 15.21  . CHOLECYSTECTOMY     mmh  . KNEE ARTHROSCOPY     right-mmh  . MASTECTOMY  2008    mmh-right  . TEMPORARY PACEMAKER INSERTION N/A 10/13/2013   Procedure: TEMPORARY PACEMAKER INSERTION;  Surgeon: Jolaine Artist, MD;  Location: [facility] CATH LAB;   Service: Cardiovascular;  Laterality: N/A;  . TOTAL KNEE ARTHROPLASTY Left 11/04/2015   Procedure: LEFT TOTAL KNEE ARTHROPLASTY;  Surgeon: Rod Can, MD;  [facility]S;  Service: Orthopedics;  Laterality: Left;    Current Outpatient Medications  Medication Sig Dispense Refill  . acetaminophen (TYLENOL) 325 MG tablet Take 2 tablets (650 mg total) by mouth every 6 (six) hours as needed for mild pain or headache (or Fever >/= 101). 30 tablet 2  . amLODipine (NORVASC) 5 MG tablet Take 10 mg by mouth daily.    . carvedilol (COREG) 3.125 MG tablet Take 1 tablet (3.125 mg total) by mouth 2 (two) times daily. 60 tablet 2  . Cholecalciferol (VITAMIN D) 2000 UNITS tablet Take 2,000 Units by mouth daily.     . furosemide (LASIX) 40 MG tablet Take 40 mg by mouth as needed.    Marland Kitchen glipiZIDE (GLUCOTROL XL) 5 MG 24 hr tablet Take 5 mg by mouth See admin instructions. Take 10 mg in the morning and 5 mg at night    . hydrALAZINE (APRESOLINE) 50 MG tablet Take 50 mg by mouth 2 (two) times daily.    . potassium chloride (KLOR-CON) 10 MEQ tablet Take 10 mEq by mouth as needed (when taking the Lasix).    . pravastatin (PRAVACHOL) 10 MG tablet Take 10 mg by mouth at bedtime.     . vitamin B-12 (CYANOCOBALAMIN) 1000 MCG tablet Take 1,000 mcg by mouth daily.    Marland Kitchen apixaban (ELIQUIS) 5 MG TABS tablet Take 5 mg by mouth 2 (two) times daily. (Patient not taking: Reported on 05/14/2020)     No current facility-administered medications for this visit.   Allergies:  Patient has no known allergies.   Social History: The patient  reports that she has never smoked. She has never used smokeless tobacco. She reports that she does not drink alcohol and does not use drugs.   Family History: The patient's family history includes Diabetes in her father; Hypertension in her father.   ROS:  Please see the history of present illness. Otherwise, complete review of systems is positive for none.  All other systems are reviewed  and negative.   Physical Exam: VS:  BP (!) 152/74   Pulse (!) 50   Ht 5\' 3"  (1.6 m)   Wt 228 lb 9.6 oz (103.7 kg)   SpO2 93%   BMI 40.49 kg/m , BMI Body mass index is 40.49 kg/m.  Wt Readings from Last 3 Encounters:  05/14/20 228 lb 9.6 oz (103.7 kg)  04/29/20 225 lb 14.4 oz (102.5 kg)  12/30/19 225 lb 6.4 oz (102.2 kg)    General: Patient appears comfortable at rest. Neck: Supple, no elevated JVP or carotid bruits, no thyromegaly. Lungs: Clear to auscultation, nonlabored breathing at rest. Cardiac: Regular rate and rhythm, no S3 or significant systolic murmur, no pericardial rub. Extremities: No pitting edema, distal pulses 2+. Skin: Warm and dry. Musculoskeletal: No kyphosis. Neuropsychiatric: Alert and oriented x3, affect grossly appropriate.  ECG:  EKG 12/21/2019 sinus rhythm 90, borderline  PR interval, probable left atrial enlargement, IVCD, consider atypical RBBB, LVH with secondary repolarization abnormality, anterior Q waves, possibly due to LVH.  Recent Labwork: 12/21/2019: ALT 17; AST 17; TSH 4.918 12/23/2019: Hemoglobin 11.5; Platelets 235 01/09/2020: BUN 64; Creatinine, Ser 2.57; Potassium 4.2; Sodium 142  No results found for: CHOL, TRIG, HDL, CHOLHDL, VLDL, LDLCALC, LDLDIRECT  Other Studies Reviewed Today:  Echocardiogram 12/23/2019  1. Left ventricular ejection fraction, by estimation, is 60 to 65%. The left ventricle has normal function. The left ventricle has no regional wall motion abnormalities. There is mild concentric left ventricular hypertrophy. Left ventricular diastolic parameters are consistent with Grade I diastolic dysfunction (impaired relaxation). Elevated left ventricular enddiastolic pressure. 2. Right ventricular systolic function is normal. The right ventricular size is mildly enlarged. There is mildly elevated pulmonary artery systolic pressure. 3. Left atrial size was severely dilated. 4. Right atrial size was mildly dilated. 5. The mitral  valve is degenerative. Trivial mitral valve regurgitation. 6. The aortic valve is tricuspid. Aortic valve regurgitation is not visualized. No aortic stenosis is present. 7. The inferior vena cava is normal in size with greater than 50% respiratory variability, suggesting right atrial pressure of 3 mmHg.  Assessment and Plan:  1. Chronic systolic heart failure ([place])   2. SVT (supraventricular tachycardia) (HCC)   3. Paroxysmal atrial fibrillation ([place])   4. Biventricular cardiac pacemaker in situ   5. CKD (chronic kidney disease) stage 4, GFR 15-29 ml/min (HCC)   6. Acute pulmonary embolism, unspecified pulmonary embolism type, unspecified whether acute cor pulmonale present ([place])   7. Essential hypertension    1. Chronic systolic heart failure ([place]) Recent echocardiogram on 12/23/2019 showed LVEF of 60 to 65%.  No WMA's, mild LVH, G1 DD, mild elevated PASP, LA severely dilated, RA mildly dilated, trivial MR.  Continue carvedilol 3.125 p.o. twice daily.  2. SVT (supraventricular tachycardia) (HCC) No recent episodes of SVT.  Continue carvedilol 3.125 mg p.o. twice daily.  3. Paroxysmal atrial fibrillation (HCC) No recent episodes of PAF.  Continue carvedilol 3.125 mg p.o. twice daily.  Continue Eliquis 5 mg p.o. twice daily.  4. Biventricular cardiac pacemaker in situ Last visit with Dr. Lovena Le patient had a normally functioning BiV pacemaker with 2.6 years of battery life Remote device check on 04/03/2020 scheduled remote reviewed. Normal device function. 14-AMS episodes with variable rates and regularity. V rates 70-125 bpm. Longest 19 mins 58 secs. Burden <1%.  .  5. CKD (chronic kidney disease) stage 4, GFR 15-29 ml/min (HCC) Patient has a scheduled surgery with Dr. Oneida Alar for right AV fistula insertion in anticipation of dialysis for stage IV/V kidney disease.  Dr. Oneida Alar has ordered Eliquis held 3 days prior to surgery.  6. Acute pulmonary embolism, unspecified pulmonary embolism  type, unspecified whether acute cor pulmonale present [facility]) Recent indeterminate CT suspicion of PE.  Patient is currently on Eliquis p.o. twice daily.  7.  Hypertension Patient's amlodipine recently increased by PCP to 10 mg daily for elevated blood pressures.  Patient states she just started increased dose earlier this week.  Blood pressure today was 152/74.  Medication Adjustments/Labs and Tests Ordered: Current medicines are reviewed at length with the patient today.  Concerns regarding medicines are outlined above.   Disposition: Follow-up with Dr. Harl Bowie or APP 6 months Signed, Levell July, NP 05/14/2020 11:16 AM    [facility], [place], [place] [address] Phone: [phone]; Fax: [phone]

## 2020-05-14 ENCOUNTER — Encounter: Payer: Self-pay | Admitting: Family Medicine

## 2020-05-14 ENCOUNTER — Other Ambulatory Visit: Payer: Self-pay

## 2020-05-14 ENCOUNTER — Ambulatory Visit: Payer: Medicare HMO | Admitting: Family Medicine

## 2020-05-14 VITALS — BP 152/74 | HR 50 | Ht 63.0 in | Wt 228.6 lb

## 2020-05-14 DIAGNOSIS — N184 Chronic kidney disease, stage 4 (severe): Secondary | ICD-10-CM | POA: Diagnosis not present

## 2020-05-14 DIAGNOSIS — I471 Supraventricular tachycardia: Secondary | ICD-10-CM | POA: Diagnosis not present

## 2020-05-14 DIAGNOSIS — Z950 Presence of cardiac pacemaker: Secondary | ICD-10-CM | POA: Diagnosis not present

## 2020-05-14 DIAGNOSIS — I10 Essential (primary) hypertension: Secondary | ICD-10-CM

## 2020-05-14 DIAGNOSIS — I2699 Other pulmonary embolism without acute cor pulmonale: Secondary | ICD-10-CM | POA: Diagnosis not present

## 2020-05-14 DIAGNOSIS — I5022 Chronic systolic (congestive) heart failure: Secondary | ICD-10-CM | POA: Diagnosis not present

## 2020-05-14 DIAGNOSIS — I480 Paroxysmal atrial fibrillation: Secondary | ICD-10-CM

## 2020-05-14 NOTE — Patient Instructions (Addendum)

## 2020-05-17 ENCOUNTER — Other Ambulatory Visit (HOSPITAL_COMMUNITY)
Admission: RE | Admit: 2020-05-17 | Discharge: 2020-05-17 | Disposition: A | Discharge status: Home / Self Care | Payer: Medicare HMO | Source: Ambulatory Visit | Attending: Vascular Surgery | Admitting: Vascular Surgery

## 2020-05-17 ENCOUNTER — Encounter: Payer: Self-pay | Admitting: Emergency Medicine

## 2020-05-17 ENCOUNTER — Other Ambulatory Visit: Payer: Self-pay

## 2020-05-17 ENCOUNTER — Encounter (HOSPITAL_COMMUNITY): Payer: Self-pay | Admitting: Vascular Surgery

## 2020-05-17 DIAGNOSIS — Z20822 Contact with and (suspected) exposure to covid-19: Secondary | ICD-10-CM | POA: Insufficient documentation

## 2020-05-17 DIAGNOSIS — Z01812 Encounter for preprocedural laboratory examination: Secondary | ICD-10-CM | POA: Diagnosis not present

## 2020-05-17 LAB — SARS CORONAVIRUS 2 (TAT 6-24 HRS): SARS Coronavirus 2: NEGATIVE

## 2020-05-17 NOTE — Progress Notes (Signed)
PERIOPERATIVE PRESCRIPTION FOR IMPLANTED CARDIAC DEVICE PROGRAMMING   Patient Information: Name: Cassandra Parker. Rauber  DOB: [birth year]  MRN: [id number] Planned Procedure: RIGHT BRACHIOCEPHALIC ARTERIOVENOUS FISTULA CREATION  Surgeon: DR. CHARLES FIELDS  Date of Procedure: 05/18/20  Cautery will be used.  Position during surgery: Supine   Please send documentation back to:  [facility] (Fax # [phone])   Feliz Beam, RN  05/17/2020 12:32 PM   Device Information:   Clinic EP Physician:   Cristopher Peru, MD Device Type:  Pacemaker Manufacturer and Phone #:  St. Jude/[employer]ott: [phone] Pacemaker Dependent?:  No Date of Last Device Check:  04/06/20        Normal Device Function?:  Yes     Electrophysiologist's Recommendations:    Have magnet available.  Provide continuous ECG monitoring when magnet is used or reprogramming is to be performed.   Procedure may interfere with device function.  Magnet should be placed over device during procedure.  Per Device Clinic Standing Orders, Cassandra Parker  05/17/2020 1:29 PM

## 2020-05-17 NOTE — Progress Notes (Addendum)
Same Day Work-Up Phone Call:  PCP - Dr. Manon Hilding, MD Cardiologist - Dr. Kate Sable, MD Electrophysiologist- Dr. Champ Mungo. Lovena Le, MD  PPM/ICD - [employer] Pacemaker Device Orders - emailed device clinic 05/17/20 requesting orders. Rep Notified Dionne Milo, 05/17/20  Chest x-ray - 12/21/19 EKG - 12/22/19 Stress Test - Denies ECHO - 12/23/19 Cardiac Cath - Denies  Sleep Study - Yes, negative for OSA CPAP - N/A  Fasting Blood Sugar - 104-120s. Checks Blood Sugar 1 times a day.  Blood Thinner Instructions: Per patient, last dose apixaban Arne Cleveland) 05/15/19/21 Aspirin Instructions: N/A  ERAS Protcol - N/A PRE-SURGERY Ensure or G2- N/A  COVID TEST- 05/17/20   Anesthesia review: Yes, cardiac hx.  ----------------------------------------------------------------------  Patient Instructions:   Your procedure is scheduled on Tuesday, August 23rd.  Report to [facility] Main Entrance "A" at 07:30 A.M., and check in at the Admitting office.  Call this number if you have problems the morning of surgery:  [phone]    Remember:  Do not eat after midnight the night before your surgery.     Take these medicines the morning of surgery with A SIP OF WATER : acetaminophen (TYLENOL)- if needed amLODipine (NORVASC) carvedilol (COREG) hydrALAZINE (APRESOLINE)    As of today, STOP taking any Aspirin (unless otherwise instructed by your surgeon) Aleve, Naproxen, Ibuprofen, Motrin, Advil, Goody's, BC's, all herbal medications, fish oil, and all vitamins.   . Do not take glipiZIDE (GLUCOTROL XL) the evening before or the  morning of surgery.  o Check your blood sugar the morning of your surgery when you wake up. o If your blood sugar is less than 70 mg/dL, you will need to treat for low blood sugar: - Treat a low blood sugar (less than 70 mg/dL) with  cup of clear juice (cranberry or apple), 4 glucose tablets, OR glucose gel. - Recheck blood sugar in 15 minutes after  treatment (to make sure it is greater than 70 mg/dL). If your blood sugar is not greater than 70 mg/dL on recheck, call [phone] for further instructions.   Day of Surgery: Shower Wear Clean/Comfortable clothing the morning of surgery Do not apply any deodorants/lotions.               Do not wear jewelry, make up, or nail polish Remember to brush your teeth WITH YOUR REGULAR TOOTHPASTE.             Do not shave 48 hours prior to surgery.              Do not bring valuables to the hospital.             [facility] is not responsible for any belongings or valuables.    Do NOT Smoke (Tobacco/Vaping) or drink Alcohol 24 hours prior to your procedure.  Contacts, glasses, dentures or bridgework may not be worn into surgery.      Patients discharged the day of surgery will not be allowed to drive home, and someone needs to stay with them for 24 hours.

## 2020-05-17 NOTE — Progress Notes (Signed)
Emailed device clinic requesting orders for upcoming surgery 05/18/20.

## 2020-05-17 NOTE — Anesthesia Preprocedure Evaluation (Addendum)
Anesthesia Evaluation  Patient identified by MRN, date of birth, ID band Patient awake    Reviewed: Allergy & Precautions, NPO status , Patient's Chart, lab work & pertinent test results  Airway Mallampati: III  TM Distance: >3 FB Neck ROM: Full    Dental  (+) Edentulous Upper, Partial Lower, Dental Advisory Given, Missing   Pulmonary pneumonia,    Pulmonary exam normal breath sounds clear to auscultation       Cardiovascular hypertension, Pt. on medications and Pt. on home beta blockers +CHF  Normal cardiovascular exam+ dysrhythmias + pacemaker  Rhythm:Regular Rate:Normal  Echo  1. Left ventricular ejection fraction, by estimation, is 60 to 65%. The left ventricle has normal function. The left ventricle has no regional wall motion abnormalities. There is mild concentric left ventricular hypertrophy. Left ventricular diastolic parameters are consistent with Grade I diastolic dysfunction (impaired relaxation). Elevated left ventricular end-diastolic pressure.  2. Right ventricular systolic function is normal. The right ventricular size is mildly enlarged. There is mildly elevated pulmonary artery systolic pressure.  3. Left atrial size was severely dilated.  4. Right atrial size was mildly dilated.  5. The mitral valve is degenerative. Trivial mitral valve regurgitation.  6. The aortic valve is tricuspid. Aortic valve regurgitation is not visualized. No aortic stenosis is present.  7. The inferior vena cava is normal in size with greater than 50% respiratory variability, suggesting right atrial pressure of 3 mmHg.    Neuro/Psych negative neurological ROS     GI/Hepatic negative GI ROS, Neg liver ROS,   Endo/Other  diabetes  Renal/GU Renal disease     Musculoskeletal  (+) Arthritis ,   Abdominal (+) + obese,   Peds  Hematology negative hematology ROS (+)   Anesthesia Other Findings   Reproductive/Obstetrics                                                              Anesthesia Evaluation  Patient identified by MRN, date of birth, ID band Patient awake    Reviewed: Allergy & Precautions, NPO status , Patient's Chart, lab work & pertinent test results, reviewed documented beta blocker date and time   Airway Mallampati: II  TM Distance: >3 FB Neck ROM: Full    Dental   Pulmonary neg pulmonary ROS,    breath sounds clear to auscultation       Cardiovascular hypertension, Pt. on medications and Pt. on home beta blockers + dysrhythmias + pacemaker + Valvular Problems/Murmurs (Mild MS)  Rhythm:Regular Rate:Normal     Neuro/Psych negative neurological ROS     GI/Hepatic negative GI ROS, Neg liver ROS,   Endo/Other  diabetes, Type 2, Oral Hypoglycemic AgentsMorbid obesity  Renal/GU CRFRenal disease     Musculoskeletal  (+) Arthritis ,   Abdominal   Peds  Hematology  (+) anemia ,   Anesthesia Other Findings   Reproductive/Obstetrics                            Lab Results  Component Value Date   WBC 9.7 12/23/2019   HGB 10.5 (L) 05/18/2020   HCT 31.0 (L) 05/18/2020   MCV 92.6 12/23/2019   PLT 235 12/23/2019   Lab Results  Component Value Date   CREATININE 3.30 (  H) 05/18/2020   BUN 54 (H) 05/18/2020   NA 144 05/18/2020   K 4.2 05/18/2020   CL 114 (H) 05/18/2020   CO2 20 (L) 01/09/2020   Lab Results  Component Value Date   INR 1.02 10/29/2015   INR 0.97 10/13/2013   INR 1.05 06/14/2010    Anesthesia Physical Anesthesia Plan  ASA: III  Anesthesia Plan: General and Regional   Post-op Pain Management: GA combined w/ Regional for post-op pain   Induction: Intravenous  Airway Management Planned: Oral ETT  Additional Equipment:   Intra-op Plan:   Post-operative Plan: Extubation in OR  Informed Consent: I have reviewed the patients History and Physical, chart, labs and discussed the procedure  including the risks, benefits and alternatives for the proposed anesthesia with the patient or authorized representative who has indicated his/her understanding and acceptance.   Dental advisory given  Plan Discussed with: CRNA  Anesthesia Plan Comments:        Anesthesia Quick Evaluation  Anesthesia Physical Anesthesia Plan  ASA: IV  Anesthesia Plan: MAC   Post-op Pain Management:    Induction: Intravenous  PONV Risk Score and Plan:   Airway Management Planned: Natural Airway  Additional Equipment: None  Intra-op Plan:   Post-operative Plan:   Informed Consent: I have reviewed the patients History and Physical, chart, labs and discussed the procedure including the risks, benefits and alternatives for the proposed anesthesia with the patient or authorized representative who has indicated his/her understanding and acceptance.     Dental advisory given  Plan Discussed with: CRNA  Anesthesia Plan Comments: (PAT note by Karoline Caldwell, PA-C: Follows with cardiology for history of chronic systolic heart failure, nonischemic cardiomyopathy, LBBB, status post BiV PPM insertion with normalization of LV function, HLD, HTN. Recent echocardiogram on 12/23/2019 showed LVEF of 60 to 65%.  No WMA's, mild LVH, G1 DD, mild elevated PASP, LA severely dilated, RA mildly dilated, trivial MR.   Last seen by cardiology 05/14/2020 and discussed upcoming surgery.  Per note, "She has a pending surgery August 17 with Dr. 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  Biventricular pacemaker recently saw Dr. Lovena Le.  Device is working normally and she had 2.6 years of battery life."  Per patient, last dose Eliquis 05/14/2020.  Periop device form has been sent to EP cardiology.  Recent indeterminate CT suspicion for PE.  She is on Eliquis p.o. twice daily.  CKD stage 4/5, pending HD vascular access.  Will need day of surgery labs and eval.  EKG 12/21/19: Sinus  rhythm. Rate 90. Borderline short PR interval. Probable left atrial enlargement. IVCD, consider atypical RBBB. LVH with secondary repolarization abnormality. Anterior Q waves, possibly due to LVH. Baseline wander in lead(s) V6  TTE 12/23/2019: 1. Left ventricular ejection fraction, by estimation, is 60 to 65%. The left ventricle has normal function. The left ventricle has no regional wall motion abnormalities. There is mild concentric left ventricular hypertrophy. Left ventricular diastolic parameters are consistent with Grade I diastolic dysfunction (impaired relaxation). Elevated left ventricular end-diastolic pressure.  2. Right ventricular systolic function is normal. The right ventricular size is mildly enlarged. There is mildly elevated pulmonary artery systolic pressure.  3. Left atrial size was severely dilated.  4. Right atrial size was mildly dilated.  5. The mitral valve is degenerative. Trivial mitral valve regurgitation.  6. The aortic valve is tricuspid. Aortic valve regurgitation is not visualized. No aortic stenosis is present.  7. The inferior  vena cava is normal in size with greater than 50% respiratory variability, suggesting right atrial pressure of 3 mmHg.   )     Anesthesia Quick Evaluation

## 2020-05-17 NOTE — Progress Notes (Signed)
Anesthesia Chart Review: Same-day work-up  Follows with cardiology for history of chronic systolic heart failure, nonischemic cardiomyopathy, LBBB, status post BiV PPM insertion with normalization of LV function, HLD, HTN. Recent echocardiogram on 12/23/2019 showed LVEF of 60 to 65%.  No WMA's, mild LVH, G1 DD, mild elevated PASP, LA severely dilated, RA mildly dilated, trivial MR.   Last seen by cardiology 05/14/2020 and discussed upcoming surgery.  Per note, "She has a pending surgery August 17 with Dr. 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  Biventricular pacemaker recently saw Dr. Lovena Le.  Device is working normally and she had 2.6 years of battery life."  Per patient, last dose Eliquis 05/14/2020.  Periop device form has been sent to EP cardiology.  Recent indeterminate CT suspicion for PE.  She is on Eliquis p.o. twice daily.  CKD stage 4/5, pending HD vascular access.  Will need day of surgery labs and eval.  EKG 12/21/19: Sinus rhythm. Rate 90. Borderline short PR interval. Probable left atrial enlargement. IVCD, consider atypical RBBB. LVH with secondary repolarization abnormality. Anterior Q waves, possibly due to LVH. Baseline wander in lead(s) V6  TTE 12/23/2019: 1. Left ventricular ejection fraction, by estimation, is 60 to 65%. The  left ventricle has normal function. The left ventricle has no regional  wall motion abnormalities. There is mild concentric left ventricular  hypertrophy. Left ventricular diastolic  parameters are consistent with Grade I diastolic dysfunction (impaired  relaxation). Elevated left ventricular end-diastolic pressure.  2. Right ventricular systolic function is normal. The right ventricular  size is mildly enlarged. There is mildly elevated pulmonary artery  systolic pressure.  3. Left atrial size was severely dilated.  4. Right atrial size was mildly dilated.  5. The mitral valve is degenerative. Trivial  mitral valve regurgitation.  6. The aortic valve is tricuspid. Aortic valve regurgitation is not  visualized. No aortic stenosis is present.  7. The inferior vena cava is normal in size with greater than 50%  respiratory variability, suggesting right atrial pressure of 3 mmHg.    Cassandra Parker [facility] Short Stay Center/Anesthesiology Phone [phone] 05/17/2020 3:16 PM

## 2020-05-18 ENCOUNTER — Encounter (HOSPITAL_COMMUNITY): Payer: Self-pay | Admitting: Vascular Surgery

## 2020-05-18 ENCOUNTER — Ambulatory Visit (HOSPITAL_COMMUNITY): Payer: Medicare HMO | Admitting: Physician Assistant

## 2020-05-18 ENCOUNTER — Encounter (HOSPITAL_COMMUNITY)
Admission: RE | Disposition: A | Discharge status: Home / Self Care | Payer: Self-pay | Source: Home / Self Care | Attending: Vascular Surgery

## 2020-05-18 ENCOUNTER — Ambulatory Visit (HOSPITAL_COMMUNITY)
Admission: RE | Admit: 2020-05-18 | Discharge: 2020-05-18 | Disposition: A | Discharge status: Home / Self Care | Payer: Medicare HMO | Attending: Vascular Surgery | Admitting: Vascular Surgery

## 2020-05-18 DIAGNOSIS — I120 Hypertensive chronic kidney disease with stage 5 chronic kidney disease or end stage renal disease: Secondary | ICD-10-CM | POA: Diagnosis not present

## 2020-05-18 DIAGNOSIS — I442 Atrioventricular block, complete: Secondary | ICD-10-CM | POA: Insufficient documentation

## 2020-05-18 DIAGNOSIS — I080 Rheumatic disorders of both mitral and aortic valves: Secondary | ICD-10-CM | POA: Insufficient documentation

## 2020-05-18 DIAGNOSIS — Z853 Personal history of malignant neoplasm of breast: Secondary | ICD-10-CM | POA: Insufficient documentation

## 2020-05-18 DIAGNOSIS — E669 Obesity, unspecified: Secondary | ICD-10-CM | POA: Insufficient documentation

## 2020-05-18 DIAGNOSIS — Z6841 Body Mass Index (BMI) 40.0 and over, adult: Secondary | ICD-10-CM | POA: Diagnosis not present

## 2020-05-18 DIAGNOSIS — Z96652 Presence of left artificial knee joint: Secondary | ICD-10-CM | POA: Diagnosis not present

## 2020-05-18 DIAGNOSIS — Z9841 Cataract extraction status, right eye: Secondary | ICD-10-CM | POA: Insufficient documentation

## 2020-05-18 DIAGNOSIS — N184 Chronic kidney disease, stage 4 (severe): Secondary | ICD-10-CM | POA: Insufficient documentation

## 2020-05-18 DIAGNOSIS — Z9011 Acquired absence of right breast and nipple: Secondary | ICD-10-CM | POA: Diagnosis not present

## 2020-05-18 DIAGNOSIS — Z8249 Family history of ischemic heart disease and other diseases of the circulatory system: Secondary | ICD-10-CM | POA: Diagnosis not present

## 2020-05-18 DIAGNOSIS — E1140 Type 2 diabetes mellitus with diabetic neuropathy, unspecified: Secondary | ICD-10-CM | POA: Diagnosis not present

## 2020-05-18 DIAGNOSIS — M1712 Unilateral primary osteoarthritis, left knee: Secondary | ICD-10-CM | POA: Insufficient documentation

## 2020-05-18 DIAGNOSIS — E1122 Type 2 diabetes mellitus with diabetic chronic kidney disease: Secondary | ICD-10-CM | POA: Insufficient documentation

## 2020-05-18 DIAGNOSIS — N185 Chronic kidney disease, stage 5: Secondary | ICD-10-CM | POA: Diagnosis not present

## 2020-05-18 DIAGNOSIS — E785 Hyperlipidemia, unspecified: Secondary | ICD-10-CM | POA: Insufficient documentation

## 2020-05-18 DIAGNOSIS — Z9049 Acquired absence of other specified parts of digestive tract: Secondary | ICD-10-CM | POA: Diagnosis not present

## 2020-05-18 DIAGNOSIS — M1990 Unspecified osteoarthritis, unspecified site: Secondary | ICD-10-CM | POA: Insufficient documentation

## 2020-05-18 DIAGNOSIS — Z961 Presence of intraocular lens: Secondary | ICD-10-CM | POA: Diagnosis not present

## 2020-05-18 DIAGNOSIS — Z833 Family history of diabetes mellitus: Secondary | ICD-10-CM | POA: Insufficient documentation

## 2020-05-18 DIAGNOSIS — I130 Hypertensive heart and chronic kidney disease with heart failure and stage 1 through stage 4 chronic kidney disease, or unspecified chronic kidney disease: Secondary | ICD-10-CM | POA: Insufficient documentation

## 2020-05-18 DIAGNOSIS — I5042 Chronic combined systolic (congestive) and diastolic (congestive) heart failure: Secondary | ICD-10-CM | POA: Diagnosis not present

## 2020-05-18 DIAGNOSIS — E7800 Pure hypercholesterolemia, unspecified: Secondary | ICD-10-CM | POA: Insufficient documentation

## 2020-05-18 DIAGNOSIS — Z950 Presence of cardiac pacemaker: Secondary | ICD-10-CM | POA: Diagnosis not present

## 2020-05-18 DIAGNOSIS — N186 End stage renal disease: Secondary | ICD-10-CM | POA: Diagnosis not present

## 2020-05-18 DIAGNOSIS — Z9842 Cataract extraction status, left eye: Secondary | ICD-10-CM | POA: Insufficient documentation

## 2020-05-18 HISTORY — PX: AV FISTULA PLACEMENT: SHX1204

## 2020-05-18 HISTORY — DX: Pneumonia, unspecified organism: J18.9

## 2020-05-18 LAB — POCT I-STAT, CHEM 8
BUN: 54 mg/dL — ABNORMAL HIGH (ref 8–23)
Calcium, Ion: 1.14 mmol/L — ABNORMAL LOW (ref 1.15–1.40)
Chloride: 114 mmol/L — ABNORMAL HIGH (ref 98–111)
Creatinine, Ser: 3.3 mg/dL — ABNORMAL HIGH (ref 0.44–1.00)
Glucose, Bld: 165 mg/dL — ABNORMAL HIGH (ref 70–99)
HCT: 31 % — ABNORMAL LOW (ref 36.0–46.0)
Hemoglobin: 10.5 g/dL — ABNORMAL LOW (ref 12.0–15.0)
Potassium: 4.2 mmol/L (ref 3.5–5.1)
Sodium: 144 mmol/L (ref 135–145)
TCO2: 20 mmol/L — ABNORMAL LOW (ref 22–32)

## 2020-05-18 LAB — GLUCOSE, CAPILLARY
Glucose-Capillary: 149 mg/dL — ABNORMAL HIGH (ref 70–99)
Glucose-Capillary: 145 mg/dL — ABNORMAL HIGH (ref 70–99)
Glucose-Capillary: 148 mg/dL — ABNORMAL HIGH (ref 70–99)

## 2020-05-18 SURGERY — ARTERIOVENOUS (AV) FISTULA CREATION
Anesthesia: Monitor Anesthesia Care | Laterality: Right

## 2020-05-18 MED ORDER — CHLORHEXIDINE GLUCONATE 4 % EX LIQD: 60.0000 mL | Freq: Once | CUTANEOUS | Status: DC

## 2020-05-18 MED ORDER — CEFAZOLIN SODIUM-DEXTROSE 2-4 GM/100ML-% IV SOLN
2.0000 g | INTRAVENOUS | Status: AC
  Administered 2020-05-18: 2 g via INTRAVENOUS
  Filled 2020-05-18: qty 100

## 2020-05-18 MED ORDER — CHLORHEXIDINE GLUCONATE 0.12 % MT SOLN
OROMUCOSAL | Status: AC
  Administered 2020-05-18: 15 mL
  Filled 2020-05-18: qty 15

## 2020-05-18 MED ORDER — PROPOFOL 10 MG/ML IV BOLUS
INTRAVENOUS | Status: DC | PRN
  Administered 2020-05-18: 25 mg via INTRAVENOUS

## 2020-05-18 MED ORDER — SODIUM CHLORIDE 0.9 % IV SOLN
INTRAVENOUS | Status: AC
  Filled 2020-05-18: qty 1.2

## 2020-05-18 MED ORDER — LIDOCAINE HCL (PF) 1 % IJ SOLN
INTRAMUSCULAR | Status: DC | PRN
  Administered 2020-05-18: 30 mL

## 2020-05-18 MED ORDER — PHENYLEPHRINE HCL-NACL 10-0.9 MG/250ML-% IV SOLN
INTRAVENOUS | Status: DC | PRN
  Administered 2020-05-18: 30 ug/min via INTRAVENOUS

## 2020-05-18 MED ORDER — 0.9 % SODIUM CHLORIDE (POUR BTL) OPTIME
TOPICAL | Status: DC | PRN
  Administered 2020-05-18: 1000 mL

## 2020-05-18 MED ORDER — FENTANYL CITRATE (PF) 250 MCG/5ML IJ SOLN
INTRAMUSCULAR | Status: DC | PRN
Start: 2020-05-18 — End: 2020-05-18
  Administered 2020-05-18 (×3): 25 ug via INTRAVENOUS

## 2020-05-18 MED ORDER — HEPARIN SODIUM (PORCINE) 1000 UNIT/ML IJ SOLN
INTRAMUSCULAR | Status: DC | PRN
  Administered 2020-05-18: 5000 [IU] via INTRAVENOUS

## 2020-05-18 MED ORDER — PROPOFOL 500 MG/50ML IV EMUL
INTRAVENOUS | Status: DC | PRN
  Administered 2020-05-18: 50 ug/kg/min via INTRAVENOUS

## 2020-05-18 MED ORDER — FENTANYL CITRATE (PF) 250 MCG/5ML IJ SOLN
INTRAMUSCULAR | Status: AC
  Filled 2020-05-18: qty 5

## 2020-05-18 MED ORDER — PROPOFOL 10 MG/ML IV BOLUS
INTRAVENOUS | Status: AC
  Filled 2020-05-18: qty 20

## 2020-05-18 MED ORDER — HYDROCODONE-ACETAMINOPHEN 5-325 MG PO TABS
1.0000 | ORAL_TABLET | Freq: Four times a day (QID) | ORAL | 0 refills | Status: DC | PRN
Start: 2020-05-18 — End: 2020-06-05

## 2020-05-18 MED ORDER — PHENYLEPHRINE 40 MCG/ML (10ML) SYRINGE FOR IV PUSH (FOR BLOOD PRESSURE SUPPORT): PREFILLED_SYRINGE | INTRAVENOUS | Status: DC | PRN

## 2020-05-18 MED ORDER — PROTAMINE SULFATE 10 MG/ML IV SOLN
INTRAVENOUS | Status: DC | PRN
  Administered 2020-05-18: 30 mg via INTRAVENOUS
  Administered 2020-05-18: 20 mg via INTRAVENOUS

## 2020-05-18 MED ORDER — SODIUM CHLORIDE 0.9 % IV SOLN: INTRAVENOUS | Status: DC | PRN

## 2020-05-18 MED ORDER — HEPARIN SODIUM (PORCINE) 1000 UNIT/ML IJ SOLN
INTRAMUSCULAR | Status: AC
  Filled 2020-05-18: qty 1

## 2020-05-18 MED ORDER — ONDANSETRON HCL 4 MG/2ML IJ SOLN
INTRAMUSCULAR | Status: DC | PRN
  Administered 2020-05-18: 4 mg via INTRAVENOUS

## 2020-05-18 MED ORDER — LIDOCAINE HCL (PF) 1 % IJ SOLN
INTRAMUSCULAR | Status: AC
  Filled 2020-05-18: qty 30

## 2020-05-18 MED ORDER — SODIUM CHLORIDE 0.9 % IV SOLN: INTRAVENOUS | Status: DC

## 2020-05-18 SURGICAL SUPPLY — 33 items
ARMBAND PINK RESTRICT EXTREMIT (MISCELLANEOUS) ×4 IMPLANT
CANISTER SUCT 3000ML PPV (MISCELLANEOUS) ×2 IMPLANT
CANNULA VESSEL 3MM 2 BLNT TIP (CANNULA) ×2 IMPLANT
CLIP VESOCCLUDE MED 6/CT (CLIP) ×2 IMPLANT
CLIP VESOCCLUDE SM WIDE 6/CT (CLIP) ×2 IMPLANT
COVER PROBE W GEL 5X96 (DRAPES) IMPLANT
COVER WAND RF STERILE (DRAPES) ×2 IMPLANT
DECANTER SPIKE VIAL GLASS SM (MISCELLANEOUS) ×2 IMPLANT
DERMABOND ADVANCED (GAUZE/BANDAGES/DRESSINGS) ×1
DERMABOND ADVANCED .7 DNX12 (GAUZE/BANDAGES/DRESSINGS) ×1 IMPLANT
DRAIN PENROSE 1/4X12 LTX STRL (WOUND CARE) ×2 IMPLANT
ELECT REM PT RETURN 9FT ADLT (ELECTROSURGICAL) ×2
ELECTRODE REM PT RTRN 9FT ADLT (ELECTROSURGICAL) ×1 IMPLANT
GLOVE BIO SURGEON STRL SZ7.5 (GLOVE) ×2 IMPLANT
GLOVE BIOGEL PI IND STRL 6.5 (GLOVE) ×1 IMPLANT
GLOVE BIOGEL PI INDICATOR 6.5 (GLOVE) ×1
GOWN STRL REUS W/ TWL LRG LVL3 (GOWN DISPOSABLE) ×3 IMPLANT
GOWN STRL REUS W/TWL LRG LVL3 (GOWN DISPOSABLE) ×6
KIT BASIN OR (CUSTOM PROCEDURE TRAY) ×2 IMPLANT
KIT TURNOVER KIT B (KITS) ×2 IMPLANT
LOOP VESSEL MINI RED (MISCELLANEOUS) IMPLANT
NS IRRIG 1000ML POUR BTL (IV SOLUTION) ×2 IMPLANT
PACK CV ACCESS (CUSTOM PROCEDURE TRAY) ×2 IMPLANT
PAD ARMBOARD 7.5X6 YLW CONV (MISCELLANEOUS) ×4 IMPLANT
SPONGE SURGIFOAM ABS GEL 100 (HEMOSTASIS) IMPLANT
SUT PROLENE 6 0 CC (SUTURE) ×6 IMPLANT
SUT PROLENE 7 0 BV 1 (SUTURE) IMPLANT
SUT VIC AB 3-0 SH 27 (SUTURE) ×2
SUT VIC AB 3-0 SH 27X BRD (SUTURE) ×1 IMPLANT
SUT VICRYL 4-0 PS2 18IN ABS (SUTURE) ×2 IMPLANT
TOWEL GREEN STERILE (TOWEL DISPOSABLE) ×2 IMPLANT
UNDERPAD 30X36 HEAVY ABSORB (UNDERPADS AND DIAPERS) ×2 IMPLANT
WATER STERILE IRR 1000ML POUR (IV SOLUTION) ×2 IMPLANT

## 2020-05-18 NOTE — Interval H&P Note (Signed)
History and Physical Interval Note:  05/18/2020 7:42 AM  Cassandra Parker  has presented today for surgery, with the diagnosis of CHRONIC KIDNEY DISEASE STAGE IV.  The various methods of treatment have been discussed with the patient and family. After consideration of risks, benefits and other options for treatment, the patient has consented to  Procedure(s): RIGHT ARM BRACHIOCEPHALIC ARTERIOVENOUS (AV) FISTULA CREATION (Right) as a surgical intervention.  The patient's history has been reviewed, patient examined, no change in status, stable for surgery.  I have reviewed the patient's chart and labs.  Questions were answered to the patient's satisfaction.     Ruta Hinds

## 2020-05-18 NOTE — Transfer of Care (Signed)
Immediate Anesthesia Transfer of Care Note  Patient: Cassandra Parker  Procedure(s) Performed: RIGHT ARM BRACHIOCEPHALIC ARTERIOVENOUS (AV) FISTULA CREATION (Right )  Patient Location: PACU  Anesthesia Type:MAC  Level of Consciousness: awake, alert  and oriented  Airway & Oxygen Therapy: Patient Spontanous Breathing and Patient connected to nasal cannula oxygen  Post-op Assessment: Report given to RN, Post -op Vital signs reviewed and stable and Patient moving all extremities  Post vital signs: Reviewed and stable  Last Vitals:  Vitals Value Taken Time  BP 155/62 05/18/20 1158  Temp    Pulse 78 05/18/20 1200  Resp 23 05/18/20 1200  SpO2 100 % 05/18/20 1200  Vitals shown include unvalidated device data.  Last Pain:  Vitals:   05/18/20 0808  TempSrc:   PainSc: 0-No pain         Complications: No complications documented.

## 2020-05-18 NOTE — Op Note (Addendum)
Procedure: Right Brachial Cephalic AV fistula  Preop: ESRD  Postop: ESRD  Anesthesia: General  Assistant:Matt Eveland to assist with retraction and create anastomosis  Findings: 4.5 mm cephalic vein  Procedure: After obtaining informed consent, the patient was taken to the operating room.  After induction of general anesthesia, the right upper extremity was prepped and draped in usual sterile fashion.  A transverse incision was then made near the antecubital crease the right arm. The incision was carried into the subcutaneous tissues down to level of the cephalic vein. The cephalic vein was approximately 4.5 mm in diameter. It was of good quality.  There was some surrounding hematoma from recent needlesticks.  The vein was dissected free circumferentially and small side branches ligated and divided between silk ties or clips. Next the brachial artery was dissected free in the medial portion of the incision. The artery was  3-4 mm in diameter. The vessel loops were placed proximal and distal to the planned site of arteriotomy. The patient was given 5000 units of intravenous heparin. After appropriate circulation time, the vessel loops were used to control the artery. A longitudinal opening was made in the brachial artery.  The vein was ligated distally with a 2-0 silk tie. The vein was controlled proximally with a fine bulldog clamp. The vein was then swung over to the artery and sewn end of vein to side of artery using a running 6-0 Prolene suture. Just prior to completion of the anastomosis, everything was fore bled back bled and thoroughly flushed. The anastomosis was secured, vessel loops released, and there was a palpable thrill in the fistula immediately. After hemostasis was obtained, the subcutaneous tissues were reapproximated using a running 3-0 Vicryl suture. The skin was then closed with a 4 Vicryl subcuticular stitch. Dermabond was applied to the skin incision.  The patient tolerated the  procedure well and there were no complications  Ruta Hinds, MD [facility] Office: [phone] Pager: [phone]

## 2020-05-18 NOTE — Anesthesia Procedure Notes (Signed)
Procedure Name: MAC Date/Time: 05/18/2020 11:34 AM Performed by: Darletta Moll, CRNA Pre-anesthesia Checklist: Patient identified, Emergency Drugs available, Suction available and Patient being monitored Patient Re-evaluated:Patient Re-evaluated prior to induction Oxygen Delivery Method: Simple face mask

## 2020-05-18 NOTE — Anesthesia Postprocedure Evaluation (Signed)
Anesthesia Post Note  Patient: Cassandra Parker  Procedure(s) Performed: RIGHT ARM BRACHIOCEPHALIC ARTERIOVENOUS (AV) FISTULA CREATION (Right )     Patient location during evaluation: PACU Anesthesia Type: MAC Level of consciousness: awake and alert Pain management: pain level controlled Vital Signs Assessment: post-procedure vital signs reviewed and stable Respiratory status: spontaneous breathing Cardiovascular status: stable Anesthetic complications: no   No complications documented.  Last Vitals:  Vitals:   05/18/20 1215 05/18/20 1230  BP: (!) 156/68 (!) 151/64  Pulse: 76 73  Resp: (!) 24 20  Temp:  36.5 C  SpO2: 90% 99%    Last Pain:  Vitals:   05/18/20 0808  TempSrc:   PainSc: 0-No pain                 Nolon Nations

## 2020-05-18 NOTE — Discharge Instructions (Signed)
° °  [facility] ° °Discharge Instructions ° °AV Fistula or Graft Surgery for Dialysis Access ° °Please refer to the following instructions for your post-procedure care. Your surgeon or physician assistant will discuss any changes with you. ° °Activity ° °You may drive the day following your surgery, if you are comfortable and no longer taking prescription pain medication. Resume full activity as the soreness in your incision resolves. ° °Bathing/Showering ° °You may shower after you go home. Keep your incision dry for 48 hours. Do not soak in a bathtub, hot tub, or swim until the incision heals completely. You may not shower if you have a hemodialysis catheter. ° °Incision Care ° °Clean your incision with mild soap and water after 48 hours. Pat the area dry with a clean towel. You do not need a bandage unless otherwise instructed. Do not apply any ointments or creams to your incision. You may have skin glue on your incision. Do not peel it off. It will come off on its own in about one week. Your arm may swell a bit after surgery. To reduce swelling use pillows to elevate your arm so it is above your heart. Your doctor will tell you if you need to lightly wrap your arm with an ACE bandage. ° °Diet ° °Resume your normal diet. There are not special food restrictions following this procedure. In order to heal from your surgery, it is CRITICAL to get adequate nutrition. Your body requires vitamins, minerals, and protein. Vegetables are the best source of vitamins and minerals. Vegetables also provide the perfect balance of protein. Processed food has little nutritional value, so try to avoid this. ° °Medications ° °Resume taking all of your medications. If your incision is causing pain, you may take over-the counter pain relievers such as acetaminophen (Tylenol). If you were prescribed a stronger pain medication, please be aware these medications can cause nausea and constipation. Prevent  nausea by taking the medication with a snack or meal. Avoid constipation by drinking plenty of fluids and eating foods with high amount of fiber, such as fruits, vegetables, and grains. Do not take Tylenol if you are taking prescription pain medications. ° ° ° ° °Follow up °Your surgeon may want to see you in the office following your access surgery. If so, this will be arranged at the time of your surgery. ° °Please call us immediately for any of the following conditions: ° °Increased pain, redness, drainage (pus) from your incision site °Fever of 101 degrees or higher °Severe or worsening pain at your incision site °Hand pain or numbness. ° °Reduce your risk of vascular disease: ° °Stop smoking. If you would like help, call [facility] at 1-800-QUIT-NOW (1-800-784-8669) or [facility] at 336-586-4000 ° °Manage your cholesterol °Maintain a desired weight °Control your diabetes °Keep your blood pressure down ° °Dialysis ° °It will take several weeks to several months for your new dialysis access to be ready for use. Your surgeon will determine when it is OK to use it. Your nephrologist will continue to direct your dialysis. You can continue to use your Permcath until your new access is ready for use. ° °If you have any questions, please call the office at [phone]. ° °

## 2020-05-19 ENCOUNTER — Encounter (HOSPITAL_COMMUNITY): Payer: Self-pay | Admitting: Vascular Surgery

## 2020-05-22 ENCOUNTER — Inpatient Hospital Stay (HOSPITAL_COMMUNITY)
Admission: EM | Admit: 2020-05-22 | Discharge: 2020-05-28 | DRG: 871 | Disposition: A | Discharge status: Home / Self Care | Payer: Medicare HMO | Attending: Family Medicine | Admitting: Family Medicine

## 2020-05-22 ENCOUNTER — Other Ambulatory Visit: Payer: Self-pay

## 2020-05-22 ENCOUNTER — Encounter (HOSPITAL_COMMUNITY): Payer: Self-pay | Admitting: Emergency Medicine

## 2020-05-22 ENCOUNTER — Emergency Department (HOSPITAL_COMMUNITY): Payer: Medicare HMO

## 2020-05-22 DIAGNOSIS — J9811 Atelectasis: Secondary | ICD-10-CM | POA: Diagnosis not present

## 2020-05-22 DIAGNOSIS — Z9221 Personal history of antineoplastic chemotherapy: Secondary | ICD-10-CM

## 2020-05-22 DIAGNOSIS — I050 Rheumatic mitral stenosis: Secondary | ICD-10-CM | POA: Diagnosis present

## 2020-05-22 DIAGNOSIS — A419 Sepsis, unspecified organism: Secondary | ICD-10-CM | POA: Diagnosis not present

## 2020-05-22 DIAGNOSIS — R6520 Severe sepsis without septic shock: Secondary | ICD-10-CM | POA: Diagnosis not present

## 2020-05-22 DIAGNOSIS — D631 Anemia in chronic kidney disease: Secondary | ICD-10-CM | POA: Diagnosis present

## 2020-05-22 DIAGNOSIS — Z96652 Presence of left artificial knee joint: Secondary | ICD-10-CM | POA: Diagnosis present

## 2020-05-22 DIAGNOSIS — I130 Hypertensive heart and chronic kidney disease with heart failure and stage 1 through stage 4 chronic kidney disease, or unspecified chronic kidney disease: Secondary | ICD-10-CM | POA: Diagnosis not present

## 2020-05-22 DIAGNOSIS — Z7901 Long term (current) use of anticoagulants: Secondary | ICD-10-CM

## 2020-05-22 DIAGNOSIS — Z20822 Contact with and (suspected) exposure to covid-19: Secondary | ICD-10-CM | POA: Diagnosis present

## 2020-05-22 DIAGNOSIS — Z950 Presence of cardiac pacemaker: Secondary | ICD-10-CM

## 2020-05-22 DIAGNOSIS — I509 Heart failure, unspecified: Secondary | ICD-10-CM

## 2020-05-22 DIAGNOSIS — R0602 Shortness of breath: Secondary | ICD-10-CM | POA: Diagnosis not present

## 2020-05-22 DIAGNOSIS — E1122 Type 2 diabetes mellitus with diabetic chronic kidney disease: Secondary | ICD-10-CM | POA: Diagnosis present

## 2020-05-22 DIAGNOSIS — K922 Gastrointestinal hemorrhage, unspecified: Secondary | ICD-10-CM | POA: Diagnosis present

## 2020-05-22 DIAGNOSIS — R062 Wheezing: Secondary | ICD-10-CM | POA: Diagnosis not present

## 2020-05-22 DIAGNOSIS — I340 Nonrheumatic mitral (valve) insufficiency: Secondary | ICD-10-CM | POA: Diagnosis not present

## 2020-05-22 DIAGNOSIS — I429 Cardiomyopathy, unspecified: Secondary | ICD-10-CM | POA: Diagnosis present

## 2020-05-22 DIAGNOSIS — N184 Chronic kidney disease, stage 4 (severe): Secondary | ICD-10-CM | POA: Diagnosis present

## 2020-05-22 DIAGNOSIS — J690 Pneumonitis due to inhalation of food and vomit: Secondary | ICD-10-CM | POA: Diagnosis present

## 2020-05-22 DIAGNOSIS — M7989 Other specified soft tissue disorders: Secondary | ICD-10-CM | POA: Diagnosis present

## 2020-05-22 DIAGNOSIS — R0600 Dyspnea, unspecified: Secondary | ICD-10-CM

## 2020-05-22 DIAGNOSIS — J189 Pneumonia, unspecified organism: Secondary | ICD-10-CM | POA: Diagnosis not present

## 2020-05-22 DIAGNOSIS — Z833 Family history of diabetes mellitus: Secondary | ICD-10-CM

## 2020-05-22 DIAGNOSIS — K921 Melena: Secondary | ICD-10-CM | POA: Diagnosis not present

## 2020-05-22 DIAGNOSIS — I10 Essential (primary) hypertension: Secondary | ICD-10-CM | POA: Diagnosis not present

## 2020-05-22 DIAGNOSIS — I2699 Other pulmonary embolism without acute cor pulmonale: Secondary | ICD-10-CM | POA: Diagnosis not present

## 2020-05-22 DIAGNOSIS — Z6841 Body Mass Index (BMI) 40.0 and over, adult: Secondary | ICD-10-CM

## 2020-05-22 DIAGNOSIS — N179 Acute kidney failure, unspecified: Secondary | ICD-10-CM | POA: Diagnosis present

## 2020-05-22 DIAGNOSIS — R0902 Hypoxemia: Secondary | ICD-10-CM | POA: Diagnosis not present

## 2020-05-22 DIAGNOSIS — Z7984 Long term (current) use of oral hypoglycemic drugs: Secondary | ICD-10-CM

## 2020-05-22 DIAGNOSIS — Z9011 Acquired absence of right breast and nipple: Secondary | ICD-10-CM

## 2020-05-22 DIAGNOSIS — J9601 Acute respiratory failure with hypoxia: Secondary | ICD-10-CM | POA: Diagnosis not present

## 2020-05-22 DIAGNOSIS — E1165 Type 2 diabetes mellitus with hyperglycemia: Secondary | ICD-10-CM | POA: Diagnosis not present

## 2020-05-22 DIAGNOSIS — Z853 Personal history of malignant neoplasm of breast: Secondary | ICD-10-CM

## 2020-05-22 DIAGNOSIS — I272 Pulmonary hypertension, unspecified: Secondary | ICD-10-CM | POA: Diagnosis present

## 2020-05-22 DIAGNOSIS — C50919 Malignant neoplasm of unspecified site of unspecified female breast: Secondary | ICD-10-CM | POA: Diagnosis present

## 2020-05-22 DIAGNOSIS — E119 Type 2 diabetes mellitus without complications: Secondary | ICD-10-CM

## 2020-05-22 DIAGNOSIS — D62 Acute posthemorrhagic anemia: Secondary | ICD-10-CM | POA: Diagnosis present

## 2020-05-22 DIAGNOSIS — R05 Cough: Secondary | ICD-10-CM | POA: Diagnosis not present

## 2020-05-22 DIAGNOSIS — I361 Nonrheumatic tricuspid (valve) insufficiency: Secondary | ICD-10-CM | POA: Diagnosis not present

## 2020-05-22 DIAGNOSIS — R52 Pain, unspecified: Secondary | ICD-10-CM | POA: Diagnosis not present

## 2020-05-22 DIAGNOSIS — Z79899 Other long term (current) drug therapy: Secondary | ICD-10-CM

## 2020-05-22 DIAGNOSIS — Z86711 Personal history of pulmonary embolism: Secondary | ICD-10-CM | POA: Diagnosis not present

## 2020-05-22 DIAGNOSIS — I442 Atrioventricular block, complete: Secondary | ICD-10-CM | POA: Diagnosis present

## 2020-05-22 DIAGNOSIS — I129 Hypertensive chronic kidney disease with stage 1 through stage 4 chronic kidney disease, or unspecified chronic kidney disease: Secondary | ICD-10-CM | POA: Diagnosis not present

## 2020-05-22 DIAGNOSIS — E7849 Other hyperlipidemia: Secondary | ICD-10-CM | POA: Diagnosis not present

## 2020-05-22 DIAGNOSIS — Z8249 Family history of ischemic heart disease and other diseases of the circulatory system: Secondary | ICD-10-CM | POA: Diagnosis not present

## 2020-05-22 DIAGNOSIS — I5032 Chronic diastolic (congestive) heart failure: Secondary | ICD-10-CM | POA: Diagnosis not present

## 2020-05-22 DIAGNOSIS — M79606 Pain in leg, unspecified: Secondary | ICD-10-CM | POA: Diagnosis present

## 2020-05-22 DIAGNOSIS — I110 Hypertensive heart disease with heart failure: Secondary | ICD-10-CM | POA: Diagnosis not present

## 2020-05-22 DIAGNOSIS — J9691 Respiratory failure, unspecified with hypoxia: Secondary | ICD-10-CM | POA: Diagnosis present

## 2020-05-22 DIAGNOSIS — I4891 Unspecified atrial fibrillation: Secondary | ICD-10-CM | POA: Diagnosis present

## 2020-05-22 DIAGNOSIS — R0689 Other abnormalities of breathing: Secondary | ICD-10-CM | POA: Diagnosis not present

## 2020-05-22 DIAGNOSIS — I517 Cardiomegaly: Secondary | ICD-10-CM | POA: Diagnosis not present

## 2020-05-22 DIAGNOSIS — E785 Hyperlipidemia, unspecified: Secondary | ICD-10-CM | POA: Diagnosis present

## 2020-05-22 DIAGNOSIS — I5023 Acute on chronic systolic (congestive) heart failure: Secondary | ICD-10-CM | POA: Diagnosis not present

## 2020-05-22 LAB — SAMPLE TO BLOOD BANK

## 2020-05-22 LAB — OCCULT BLOOD X 1 CARD TO LAB, STOOL: Fecal Occult Bld: POSITIVE — AB

## 2020-05-22 LAB — COMPREHENSIVE METABOLIC PANEL
ALT: 34 U/L (ref 0–44)
AST: 46 U/L — ABNORMAL HIGH (ref 15–41)
Albumin: 3.3 g/dL — ABNORMAL LOW (ref 3.5–5.0)
Alkaline Phosphatase: 67 U/L (ref 38–126)
Anion gap: 8 (ref 5–15)
BUN: 73 mg/dL — ABNORMAL HIGH (ref 8–23)
CO2: 21 mmol/L — ABNORMAL LOW (ref 22–32)
Calcium: 8.5 mg/dL — ABNORMAL LOW (ref 8.9–10.3)
Chloride: 109 mmol/L (ref 98–111)
Creatinine, Ser: 3.56 mg/dL — ABNORMAL HIGH (ref 0.44–1.00)
GFR calc Af Amer: 14 mL/min — ABNORMAL LOW (ref >60)
GFR calc non Af Amer: 12 mL/min — ABNORMAL LOW (ref >60)
Glucose, Bld: 250 mg/dL — ABNORMAL HIGH (ref 70–99)
Potassium: 4.2 mmol/L (ref 3.5–5.1)
Sodium: 138 mmol/L (ref 135–145)
Total Bilirubin: 0.6 mg/dL (ref 0.3–1.2)
Total Protein: 7 g/dL (ref 6.5–8.1)

## 2020-05-22 LAB — CBC WITH DIFFERENTIAL/PLATELET
Abs Immature Granulocytes: 0.06 10*3/uL (ref 0.00–0.07)
Basophils Absolute: 0.1 10*3/uL (ref 0.0–0.1)
Basophils Relative: 0 %
Eosinophils Absolute: 0 10*3/uL (ref 0.0–0.5)
Eosinophils Relative: 0 %
HCT: 30.1 % — ABNORMAL LOW (ref 36.0–46.0)
Hemoglobin: 9.1 g/dL — ABNORMAL LOW (ref 12.0–15.0)
Immature Granulocytes: 1 %
Lymphocytes Relative: 14 %
Lymphs Abs: 1.7 10*3/uL (ref 0.7–4.0)
MCH: 29.2 pg (ref 26.0–34.0)
MCHC: 30.2 g/dL (ref 30.0–36.0)
MCV: 96.5 fL (ref 80.0–100.0)
Monocytes Absolute: 0.9 10*3/uL (ref 0.1–1.0)
Monocytes Relative: 8 %
Neutro Abs: 9.3 10*3/uL — ABNORMAL HIGH (ref 1.7–7.7)
Neutrophils Relative %: 77 %
Platelets: 247 10*3/uL (ref 150–400)
RBC: 3.12 MIL/uL — ABNORMAL LOW (ref 3.87–5.11)
RDW: 14.9 % (ref 11.5–15.5)
WBC: 11.9 10*3/uL — ABNORMAL HIGH (ref 4.0–10.5)
nRBC: 0 % (ref 0.0–0.2)

## 2020-05-22 LAB — BRAIN NATRIURETIC PEPTIDE: B Natriuretic Peptide: 1462 pg/mL — ABNORMAL HIGH (ref 0.0–100.0)

## 2020-05-22 LAB — HEMOGLOBIN A1C
Hgb A1c MFr Bld: 7.4 % — ABNORMAL HIGH (ref 4.8–5.6)
Mean Plasma Glucose: 165.68 mg/dL

## 2020-05-22 LAB — HEMOGLOBIN AND HEMATOCRIT, BLOOD
HCT: 26.8 % — ABNORMAL LOW (ref 36.0–46.0)
Hemoglobin: 8 g/dL — ABNORMAL LOW (ref 12.0–15.0)

## 2020-05-22 LAB — TROPONIN I (HIGH SENSITIVITY)
Troponin I (High Sensitivity): 114 ng/L (ref <18)
Troponin I (High Sensitivity): 117 ng/L (ref <18)

## 2020-05-22 LAB — GLUCOSE, CAPILLARY: Glucose-Capillary: 183 mg/dL — ABNORMAL HIGH (ref 70–99)

## 2020-05-22 LAB — PROTIME-INR
INR: 1.9 — ABNORMAL HIGH (ref 0.8–1.2)
Prothrombin Time: 21.1 seconds — ABNORMAL HIGH (ref 11.4–15.2)

## 2020-05-22 LAB — LACTIC ACID, PLASMA
Lactic Acid, Venous: 1.6 mmol/L (ref 0.5–1.9)
Lactic Acid, Venous: 0.8 mmol/L (ref 0.5–1.9)

## 2020-05-22 LAB — SARS CORONAVIRUS 2 BY RT PCR (HOSPITAL ORDER, PERFORMED IN ~~LOC~~ HOSPITAL LAB): SARS Coronavirus 2: NEGATIVE

## 2020-05-22 MED ORDER — SODIUM CHLORIDE 0.9 % IV SOLN: 250.0000 mL | INTRAVENOUS | Status: DC | PRN

## 2020-05-22 MED ORDER — ADULT MULTIVITAMIN W/MINERALS CH
1.0000 | ORAL_TABLET | Freq: Every day | ORAL | Status: DC
  Administered 2020-05-22 – 2020-05-28 (×7): 1 via ORAL
  Filled 2020-05-22 (×7): qty 1

## 2020-05-22 MED ORDER — IPRATROPIUM-ALBUTEROL 0.5-2.5 (3) MG/3ML IN SOLN
3.0000 mL | Freq: Three times a day (TID) | RESPIRATORY_TRACT | Status: DC
  Administered 2020-05-22 (×2): 3 mL via RESPIRATORY_TRACT
  Filled 2020-05-22 (×2): qty 3

## 2020-05-22 MED ORDER — INSULIN ASPART 100 UNIT/ML ~~LOC~~ SOLN
0.0000 [IU] | Freq: Three times a day (TID) | SUBCUTANEOUS | Status: DC
  Administered 2020-05-23: 2 [IU] via SUBCUTANEOUS
  Administered 2020-05-24 (×2): 1 [IU] via SUBCUTANEOUS
  Administered 2020-05-25: 2 [IU] via SUBCUTANEOUS
  Administered 2020-05-25: 1 [IU] via SUBCUTANEOUS
  Administered 2020-05-25: 3 [IU] via SUBCUTANEOUS

## 2020-05-22 MED ORDER — VITAMIN B-12 1000 MCG PO TABS
1000.0000 ug | ORAL_TABLET | Freq: Every day | ORAL | Status: DC
  Administered 2020-05-22 – 2020-05-28 (×7): 1000 ug via ORAL
  Filled 2020-05-22 (×7): qty 1

## 2020-05-22 MED ORDER — SODIUM CHLORIDE 0.9 % IV SOLN
2.0000 g | INTRAVENOUS | Status: DC
  Administered 2020-05-23 – 2020-05-28 (×6): 2 g via INTRAVENOUS
  Filled 2020-05-22 (×6): qty 20

## 2020-05-22 MED ORDER — SODIUM CHLORIDE 0.9 % IV SOLN
500.0000 mg | INTRAVENOUS | Status: DC
  Administered 2020-05-23 – 2020-05-26 (×4): 500 mg via INTRAVENOUS
  Filled 2020-05-22 (×5): qty 500

## 2020-05-22 MED ORDER — FUROSEMIDE 40 MG PO TABS
40.0000 mg | ORAL_TABLET | Freq: Every day | ORAL | Status: DC
  Administered 2020-05-23 – 2020-05-25 (×3): 40 mg via ORAL
  Filled 2020-05-22 (×3): qty 1

## 2020-05-22 MED ORDER — VITAMIN D 25 MCG (1000 UNIT) PO TABS
2000.0000 [IU] | ORAL_TABLET | Freq: Every day | ORAL | Status: DC
  Administered 2020-05-23 – 2020-05-28 (×6): 2000 [IU] via ORAL
  Filled 2020-05-22 (×8): qty 2

## 2020-05-22 MED ORDER — SODIUM CHLORIDE 0.9 % IV SOLN
500.0000 mg | Freq: Once | INTRAVENOUS | Status: AC
  Administered 2020-05-22: 500 mg via INTRAVENOUS
  Filled 2020-05-22: qty 500

## 2020-05-22 MED ORDER — ONDANSETRON HCL 4 MG/2ML IJ SOLN: 4.0000 mg | Freq: Four times a day (QID) | INTRAMUSCULAR | Status: DC | PRN

## 2020-05-22 MED ORDER — ACETAMINOPHEN 325 MG PO TABS: 650.0000 mg | ORAL_TABLET | Freq: Four times a day (QID) | ORAL | Status: DC | PRN

## 2020-05-22 MED ORDER — AMLODIPINE BESYLATE 5 MG PO TABS
10.0000 mg | ORAL_TABLET | Freq: Every day | ORAL | Status: DC
  Administered 2020-05-23 – 2020-05-28 (×6): 10 mg via ORAL
  Filled 2020-05-22 (×6): qty 2

## 2020-05-22 MED ORDER — TRAZODONE HCL 50 MG PO TABS: 50.0000 mg | ORAL_TABLET | Freq: Every evening | ORAL | Status: DC | PRN

## 2020-05-22 MED ORDER — INSULIN ASPART 100 UNIT/ML ~~LOC~~ SOLN
0.0000 [IU] | Freq: Every day | SUBCUTANEOUS | Status: DC
  Administered 2020-05-23 – 2020-05-25 (×2): 2 [IU] via SUBCUTANEOUS

## 2020-05-22 MED ORDER — CARVEDILOL 3.125 MG PO TABS
3.1250 mg | ORAL_TABLET | Freq: Two times a day (BID) | ORAL | Status: DC
  Administered 2020-05-22 – 2020-05-28 (×12): 3.125 mg via ORAL
  Filled 2020-05-22 (×12): qty 1

## 2020-05-22 MED ORDER — SODIUM CHLORIDE 0.9% FLUSH
3.0000 mL | Freq: Two times a day (BID) | INTRAVENOUS | Status: DC
  Administered 2020-05-22 – 2020-05-27 (×9): 3 mL via INTRAVENOUS

## 2020-05-22 MED ORDER — SODIUM CHLORIDE 0.9 % IV SOLN
1.0000 g | Freq: Once | INTRAVENOUS | Status: AC
  Administered 2020-05-22: 1 g via INTRAVENOUS
  Filled 2020-05-22: qty 10

## 2020-05-22 MED ORDER — ACETAMINOPHEN 650 MG RE SUPP: 650.0000 mg | Freq: Four times a day (QID) | RECTAL | Status: DC | PRN

## 2020-05-22 MED ORDER — PRAVASTATIN SODIUM 10 MG PO TABS
10.0000 mg | ORAL_TABLET | Freq: Every day | ORAL | Status: DC
  Administered 2020-05-22 – 2020-05-27 (×6): 10 mg via ORAL
  Filled 2020-05-22 (×6): qty 1

## 2020-05-22 MED ORDER — SODIUM CHLORIDE 0.9% FLUSH: 3.0000 mL | INTRAVENOUS | Status: DC | PRN

## 2020-05-22 MED ORDER — ONDANSETRON HCL 4 MG PO TABS: 4.0000 mg | ORAL_TABLET | Freq: Four times a day (QID) | ORAL | Status: DC | PRN

## 2020-05-22 MED ORDER — ALBUTEROL SULFATE (2.5 MG/3ML) 0.083% IN NEBU: 2.5000 mg | INHALATION_SOLUTION | RESPIRATORY_TRACT | Status: DC | PRN

## 2020-05-22 MED ORDER — LABETALOL HCL 5 MG/ML IV SOLN: 10.0000 mg | INTRAVENOUS | Status: DC | PRN

## 2020-05-22 MED ORDER — POLYETHYLENE GLYCOL 3350 17 G PO PACK: 17.0000 g | PACK | Freq: Every day | ORAL | Status: DC | PRN

## 2020-05-22 MED ORDER — HYDROCODONE-ACETAMINOPHEN 5-325 MG PO TABS
1.0000 | ORAL_TABLET | Freq: Four times a day (QID) | ORAL | Status: DC | PRN
  Administered 2020-05-22 – 2020-05-24 (×4): 1 via ORAL
  Filled 2020-05-22 (×4): qty 1

## 2020-05-22 MED ORDER — APIXABAN 2.5 MG PO TABS: 2.5000 mg | ORAL_TABLET | Freq: Two times a day (BID) | ORAL | Status: DC

## 2020-05-22 MED ORDER — GUAIFENESIN ER 600 MG PO TB12
600.0000 mg | ORAL_TABLET | Freq: Two times a day (BID) | ORAL | Status: DC
  Administered 2020-05-22 – 2020-05-28 (×12): 600 mg via ORAL
  Filled 2020-05-22 (×12): qty 1

## 2020-05-22 MED ORDER — PANTOPRAZOLE SODIUM 40 MG IV SOLR
40.0000 mg | Freq: Two times a day (BID) | INTRAVENOUS | Status: DC
  Administered 2020-05-22 – 2020-05-25 (×7): 40 mg via INTRAVENOUS
  Filled 2020-05-22 (×8): qty 40

## 2020-05-22 MED ORDER — POTASSIUM CHLORIDE CRYS ER 10 MEQ PO TBCR
10.0000 meq | EXTENDED_RELEASE_TABLET | Freq: Every day | ORAL | Status: DC
  Administered 2020-05-23 – 2020-05-25 (×3): 10 meq via ORAL
  Filled 2020-05-22 (×4): qty 1

## 2020-05-22 MED ORDER — IPRATROPIUM-ALBUTEROL 0.5-2.5 (3) MG/3ML IN SOLN
3.0000 mL | Freq: Three times a day (TID) | RESPIRATORY_TRACT | Status: DC
  Administered 2020-05-23 – 2020-05-25 (×7): 3 mL via RESPIRATORY_TRACT
  Filled 2020-05-22 (×8): qty 3

## 2020-05-22 MED ORDER — HYDRALAZINE HCL 25 MG PO TABS
50.0000 mg | ORAL_TABLET | Freq: Two times a day (BID) | ORAL | Status: DC
  Administered 2020-05-22 – 2020-05-28 (×12): 50 mg via ORAL
  Filled 2020-05-22 (×12): qty 2

## 2020-05-22 NOTE — ED Notes (Signed)
Assisted patient with ambulating to toilet. Pt's O2 went down to 85% on room air, returned to O2 and sitting in bed O2 came back up to 92%. Informed RN. Pt comfortable in bed at this time.

## 2020-05-22 NOTE — ED Triage Notes (Signed)
Pt here with SOB. O2 sats with EMS were 68% on RA. 1 week ago, she had a dialysis port put in. CBG 263. Pt has pacemaker. No history of COPD or CHF. Bilateral pitting edema to legs 4+. Left sided crackles and right sided wheezing. First CO2 31. After 10 mins of CPAP, pt breathing improved. Normally is ambulatory without any SOB.

## 2020-05-22 NOTE — ED Notes (Signed)
Pt reports she has had her two covid vaccinations  moderna   And is ready for a booster shot

## 2020-05-22 NOTE — ED Notes (Signed)
Call to the floor for report

## 2020-05-22 NOTE — ED Notes (Signed)
Report to the floor to [facility]

## 2020-05-22 NOTE — ED Notes (Signed)
CRITICAL VALUE ALERT  Critical Value:  Troponin 117  Date & Time Notied:  05/22/2020 @ 1430  Provider Notified: Dr Alvino Chapel  Orders Received/Actions taken: No orders at this time

## 2020-05-22 NOTE — H&P (Signed)
Patient Demographics:    Cassandra Parker, is a 79 y.o. female  MRN: [id number]   DOB - 01-01-1941  Admit Date - 05/22/2020  Outpatient Primary MD for the patient is [name], [name], MD   Assessment & Plan:    Principal Problem:   Sepsis due to PNA Active Problems:   Cardiomyopathy- EF 35-40% (etiology not yet determined)   Respiratory failure with hypoxia ([place])   Rt U L Pneumonia   GI bleed   HTN (hypertension)   Diabetes mellitus type 2, controlled, without complications ([place])   Cancer of breast- s/p chemo and Rt mastectomy 2008   Mitral stenosis- moderate with trivial MR   1)Sepsis secondary to Pneumonia----Patient met sepsis criteria on admission with finding of right-sided pneumonia, leukocytosis,  tachypnea, and hypoxia -No tachycardia most likely due to carvedilol use --- Treat empirically with IV Rocephin and azithromycin, mucolytics and supplemental oxygen as ordered -Lactic acid pending  2)Possible GI bleed--- patient with dark stools after restarting Eliquis, hemoglobin down to 9.1 with a baseline that is usually close to 11 -Give IV Protonix -Check stool for occult blood -Serial H&H -Transfuse as clinically indicated -Stop Eliquis  3)HFpEF/chronic diastolic dysfunction CHF--elevated BNP noted, echo from March 2021 with EF of 65% with grade 1 diastolic dysfunction -Troponin is 114>> 117--- this is consistent with patient prior baseline --- bnp 1,462 no baseline available -Continue Lasix -Chest x-ray without overt failure, hypoxia most likely from #1 above -Repeat echo given increase shortness of breath and troponin elevation -c/n  Coreg  4) acute hypoxic respiratory failure--- likely secondary to pneumonia as above #1 --She is found to be hypoxic with O2 sats of 68 % on room air, currently on 4  to 5 L of oxygen---  --previously no hypoxia and patient is not on home O2 --- Manage as above #1  5)RUL PNA-- CAP--- suspect aspiration related, chest x-ray suggestive of right upper lobe pneumonia in the setting of recent anesthesia/sedation for AV fistula creation on 05/18/2020 raises concern about possible aspiration --Manage as above #1  6)H/o PE--diagnosed in March 2021 with VQ scan that was intermediate in the setting of elevated D-dimer -Patient was unable to do CTA chest at that time due to kidney concerns -He has been compliant with Eliquis except for  holding her Eliquis from 05/14/2020 through 07/18/2020 inclusive to allow for right upper extremity AV fistula creation, she restarted Eliquis on 05/19/2020 --- Given dark stools and drop in H&H we will hold Eliquis --Get lower extremity venous Dopplers--especially given right eye area discomfort and right lower extremity swelling  7)History of Complete Heart Block s/p Pacemaker placement -EKG shows paced rhythm.  Stable.  Routine follow-up as outpatient.  8)CKD IV--creatinine currently 3.5 which is not far from a recent baseline --Status post AV fistula creation on 05/18/2020 in anticipation of HD in the near term -- renally adjust medications, avoid nephrotoxic agents / dehydration  / hypotension   9)Diabetes mellitus type  2 -Sliding scale insulin coverage with fingerstick monitoring -Hold glipizide for now  10)History of Rt Breast cancer -S/p chemo and Rt mastectomy 2008 Status post right-sided mastectomy and chemotherapy -Stable, follow-up as outpatient  11) acute on chronicanemia--at baseline patient has anemia of CKD -Suspect worsening anemia with hemoglobin down to 9.1 from a baseline close to 11 is due to acute GI bleed/ABLA  code Status:full  Family Communication: (patient is alert, awake and coherent) --Discussed with  daughter at bedside  Consults :na   With History of - Reviewed by me  Past  Medical History:  Diagnosis Date  . Acute renal insufficiency 10/15/2013  . Arthritis   . Cancer [facility]) 2008   right breast  . Cancer of breast- s/p chemo and Rt mastectomy 2008 10/13/2013  . Cardiomyopathy ([place])    dating back to 2010  . CHB (complete heart block) ([place]) 10/13/2013  . Chronic combined systolic and diastolic congestive heart failure ([place])   . Diabetes mellitus    with neuropathy  . Diabetes mellitus type 2, controlled, without complications ([place]) 3/47/4259  . Dyslipidemia 10/13/2013  . History of blood transfusion   . Hypercholesterolemia   . Hypertension   . Mitral stenosis- moderate with trivial MR  10/15/2013  . Pacemaker 10/23/2014  . Pneumonia   . Presence of permanent cardiac pacemaker    [employer]  . Primary osteoarthritis of left knee 11/04/2015  . Syncope 10/13/2013  . Tingling    feet bilat       Past Surgical History:  Procedure Laterality Date  . APPENDECTOMY     mmh  . AV FISTULA PLACEMENT Right 05/18/2020   Procedure: RIGHT ARM BRACHIOCEPHALIC ARTERIOVENOUS (AV) FISTULA CREATION;  Surgeon: Elam Dutch, MD;  Location: [facility];  Service: Vascular;  Laterality: Right;  . BI-VENTRICULAR PACEMAKER INSERTION N/A 10/15/2013   Procedure: BI-VENTRICULAR PACEMAKER INSERTION (CRT-P);  Surgeon: Evans Lance, MD;  Location[facility] CATH LAB;  Service: Cardiovascular;  Laterality: N/A;  . Cardiomyopathy    . CATARACT EXTRACTION W/PHACO  06/29/2011   Procedure: CATARACT EXTRACTION PHACO AND INTRAOCULAR LENS PLACEMENT (IOC);  Surgeon: Tonny Branch;  Location: [facility];  Service: Ophthalmology;  Laterality: Right;  CDE: 14.41  . CATARACT EXTRACTION W/PHACO  09/04/2011   Procedure: CATARACT EXTRACTION PHACO AND INTRAOCULAR LENS PLACEMENT (IOC);  Surgeon: Tonny Branch;  Location: [facility];  Service: Ophthalmology;  Laterality: Left;  CDE: 15.21  . CHOLECYSTECTOMY     mmh  . JOINT REPLACEMENT Left    knee  . KNEE ARTHROSCOPY     right-mmh  . MASTECTOMY  2008    mmh-right  .  TEMPORARY PACEMAKER INSERTION N/A 10/13/2013   Procedure: TEMPORARY PACEMAKER INSERTION;  Surgeon: Jolaine Artist, MD;  Location: [facility] CATH LAB;  Service: Cardiovascular;  Laterality: N/A;  . TOTAL KNEE ARTHROPLASTY Left 11/04/2015   Procedure: LEFT TOTAL KNEE ARTHROPLASTY;  Surgeon: Rod Can, MD;  Location: [facility];  Service: Orthopedics;  Laterality: Left;    Chief Complaint  Patient presents with  . Shortness of Breath      HPI:    Ruble Buttler  is a 79 y.o. female with CKDIV, s/p right mastectomy and presumably axillary node dissection, s/p pacemaker on the left side, s/p  AV fistula creation on 05/18/2020, HTN, DM2, HLD, History of Complete Heart Block s/p Pacemaker placement, HFpEF, chronic Anemia and h/o PE (11/2019) on Eliquis presenting with hypoxia and shob--  -Patient states that she was holding her Eliquis from 05/14/2020 through  07/18/2020 inclusive to allow for right upper extremity AV fistula creation, she restarted Eliquis on 05/19/2020 -On 05/21/2020 she started to notice dark stools, had right thigh area pain and lower abdominal discomfort -No bright red blood per rectum no hematemesis no hematochezia -Shortness of breath got worse, she was found to be hypoxic by EMS and today in the ED here In ED--chest x-ray suggestive of right upper lobe pneumonia in the setting of recent anesthesia/sedation for AV fistula creation on 05/18/2020 raises concern about possible aspiration -WBC 11.9, no fevers no chills, no vomiting, patient has chronic loose stools -She is found to be hypoxic with O2 sats of 68 % on room air, currently on 4 to 5 L of oxygen---  --previously no hypoxia and patient is not on home O2  Troponin is 114>> 117--- this is consistent with patient prior baseline --- bnp 1,462 no baseline available hgb 9.1 (baseline 10 to 11) Creatinine 3.5 --close to baseline -Patient met sepsis criteria on admission with finding of right-sided pneumonia, leukocytosis,   tachypnea, and hypoxia   Review of systems:    In addition to the HPI above,   A full Review of  Systems was done, all other systems reviewed are negative except as noted above in HPI , .    Social History:  Reviewed by me    Social History   Tobacco Use  . Smoking status: Never Smoker  . Smokeless tobacco: Never Used  Substance Use Topics  . Alcohol use: No    Alcohol/week: 0.0 standard drinks     Family History :  Reviewed by me    Family History  Problem Relation Age of Onset  . Hypertension Father   . Diabetes Father   . Anesthesia problems Neg Hx   . Hypotension Neg Hx   . Malignant hyperthermia Neg Hx   . Pseudochol deficiency Neg Hx      Home Medications:   Prior to Admission medications   Medication Sig Start Date End Date Taking? Authorizing Provider  acetaminophen (TYLENOL) 325 MG tablet Take 2 tablets (650 mg total) by mouth every 6 (six) hours as needed for mild pain or headache (or Fever >/= 101). 12/23/19  Yes Jeziah Kretschmer, MD  amLODipine (NORVASC) 5 MG tablet Take 10 mg by mouth daily.   Yes [provider]  apixaban (ELIQUIS) 5 MG TABS tablet Take 5 mg by mouth 2 (two) times daily.    Yes [provider]  carvedilol (COREG) 3.125 MG tablet Take 1 tablet (3.125 mg total) by mouth 2 (two) times daily. 12/23/19  Yes Roxi Hlavaty, MD  Cholecalciferol (VITAMIN D) 2000 UNITS tablet Take 2,000 Units by mouth daily.    Yes [provider]  furosemide (LASIX) 40 MG tablet Take 40 mg by mouth daily.    Yes [provider]  glipiZIDE (GLUCOTROL XL) 5 MG 24 hr tablet Take 5 mg by mouth See admin instructions. Take 10 mg in the morning and 5 mg at night   Yes [provider]  hydrALAZINE (APRESOLINE) 50 MG tablet Take 50 mg by mouth 2 (two) times daily.   Yes [provider]  HYDROcodone-acetaminophen (NORCO) 5-325 MG tablet Take 1 tablet by mouth every 6 (six) hours as needed for moderate pain. 05/18/20   Yes Dagoberto Ligas, PA-C  potassium chloride (KLOR-CON) 10 MEQ tablet Take 10 mEq by mouth daily.    Yes [provider]  pravastatin (PRAVACHOL) 10 MG tablet Take 10 mg by mouth at  bedtime.    Yes [provider]  vitamin B-12 (CYANOCOBALAMIN) 1000 MCG tablet Take 1,000 mcg by mouth daily.   Yes [provider]     Allergies:    No Known Allergies   Physical Exam:   Vitals  Blood pressure 134/66, pulse 76, temperature 98 F (36.7 C), temperature source Oral, resp. rate (!) 21, height 5' 3"  (1.6 m), weight 105.7 kg, SpO2 90 %.  Physical Examination: General appearance - alert, morbidly obese appearing, and with conversational dyspnea Mental status - alert, oriented to person, place, and time,  Eyes - sclera anicteric Nose- [place] 4L/min Neck - supple, no JVD elevation , Chest -diminished in bases, scattered rhonchi bilaterally, wheezes in upper lung fields Heart - S1 and S2 normal, regular, left subclavian area pacemaker in situ Abdomen - soft,  nondistended, +BS, mildly uncomfortable lower quadrants without rebound or guarding, no CVA area tenderness Neurological - screening mental status exam normal, neck supple without rigidity, cranial nerves II through XII intact, DTR's normal and symmetric Extremities -   intact peripheral pulses , right thigh area discomfort and swelling, venous Dopplers pending Skin - warm, dry     Data Review:    CBC Recent Labs  Lab 05/18/20 0827 05/22/20 1115  WBC  --  11.9*  HGB 10.5* 9.1*  HCT 31.0* 30.1*  PLT  --  247  MCV  --  96.5  MCH  --  29.2  MCHC  --  30.2  RDW  --  14.9  LYMPHSABS  --  1.7  MONOABS  --  0.9  EOSABS  --  0.0  BASOSABS  --  0.1   ------------------------------------------------------------------------------------------------------------------  Chemistries  Recent Labs  Lab 05/18/20 0827 05/22/20 1115  NA 144 138  K 4.2 4.2  CL 114* 109  CO2  --  21*  GLUCOSE 165* 250*  BUN 54*  73*  CREATININE 3.30* 3.56*  CALCIUM  --  8.5*  AST  --  46*  ALT  --  34  ALKPHOS  --  67  BILITOT  --  0.6   ------------------------------------------------------------------------------------------------------------------ estimated creatinine clearance is 15.6 mL/min (A) (by C-G formula based on SCr of 3.56 mg/dL (H)). ------------------------------------------------------------------------------------------------------------------ No results for input(s): TSH, T4TOTAL, T3FREE, THYROIDAB in the last 72 hours.  Invalid input(s): FREET3   Coagulation profile Recent Labs  Lab 05/22/20 1115  INR 1.9*   ------------------------------------------------------------------------------------------------------------------- No results for input(s): DDIMER in the last 72 hours. -------------------------------------------------------------------------------------------------------------------  Cardiac Enzymes No results for input(s): CKMB, TROPONINI, MYOGLOBIN in the last 168 hours.  Invalid input(s): CK ------------------------------------------------------------------------------------------------------------------    Component Value Date/Time   BNP 1,462.0 (H) 05/22/2020 1115     ---------------------------------------------------------------------------------------------------------------  Urinalysis    Component Value Date/Time   COLORURINE YELLOW 12/14/2015 2300   APPEARANCEUR CLEAR 12/14/2015 2300   LABSPEC 1.020 12/14/2015 2300   PHURINE 5.0 12/14/2015 2300   GLUCOSEU 100 (A) 12/14/2015 2300   HGBUR NEGATIVE 12/14/2015 2300   BILIRUBINUR SMALL (A) 12/14/2015 2300   KETONESUR TRACE (A) 12/14/2015 2300   PROTEINUR 100 (A) 12/14/2015 2300   UROBILINOGEN 0.2 10/13/2013 2318   NITRITE NEGATIVE 12/14/2015 2300   LEUKOCYTESUR TRACE (A) 12/14/2015 2300    ----------------------------------------------------------------------------------------------------------------    Imaging Results:    DG Chest Portable 1 View  Result Date: 05/22/2020 CLINICAL DATA:  Shortness of breath, hypoxemia, end-stage renal disease on dialysis, LEFT-sided crackles, RIGHT-side week EXAM: PORTABLE CHEST 1 VIEW COMPARISON:  Portable exam 1042 hours compared to 12/21/2019 FINDINGS: LEFT  subclavian sequential pacemaker with leads projecting over RIGHT atrium, RIGHT ventricle, and coronary sinus. Enlargement of cardiac silhouette with vascular congestion. Minimal LEFT base atelectasis. RIGHT perihilar opacities extending into RIGHT upper lobe favor pneumonia although requiring follow-up until resolution to exclude underlying nodule. Question mild enlargement of RIGHT hilum versus perihilar infiltrate. Remaining lungs clear. No pleural effusion or pneumothorax. Bones demineralized. IMPRESSION: Enlargement of cardiac silhouette post pacemaker. Questionable RIGHT hilar enlargement/adenopathy versus perihilar infiltrate. RIGHT upper lobe pneumonia; follow-up exams until resolution recommended to exclude underlying pulmonary mass/nodule and hilar adenopathy. Electronically Signed   By: Lavonia Dana M.D.   On: 05/22/2020 11:16    Radiological Exams on Admission: DG Chest Portable 1 View  Result Date: 05/22/2020 CLINICAL DATA:  Shortness of breath, hypoxemia, end-stage renal disease on dialysis, LEFT-sided crackles, RIGHT-side week EXAM: PORTABLE CHEST 1 VIEW COMPARISON:  Portable exam 1042 hours compared to 12/21/2019 FINDINGS: LEFT subclavian sequential pacemaker with leads projecting over RIGHT atrium, RIGHT ventricle, and coronary sinus. Enlargement of cardiac silhouette with vascular congestion. Minimal LEFT base atelectasis. RIGHT perihilar opacities extending into RIGHT upper lobe favor pneumonia although requiring follow-up until resolution to exclude underlying nodule. Question mild enlargement of RIGHT hilum versus perihilar infiltrate. Remaining lungs clear. No pleural effusion or pneumothorax.  Bones demineralized. IMPRESSION: Enlargement of cardiac silhouette post pacemaker. Questionable RIGHT hilar enlargement/adenopathy versus perihilar infiltrate. RIGHT upper lobe pneumonia; follow-up exams until resolution recommended to exclude underlying pulmonary mass/nodule and hilar adenopathy. Electronically Signed   By: Lavonia Dana M.D.   On: 05/22/2020 11:16   DVT Prophylaxis -SCD  AM Labs Ordered, also please review Full Orders  Family Communication: Admission, patients condition and plan of care including tests being ordered have been discussed with the patient and daughter who indicate understanding and agree with the plan   Code Status - Full Code  Likely DC to  Home improvement in hypoxia/sepsis and ruling out GI bleed  Condition   stable  Roxan Hockey M.D on 05/22/2020 at 6:20 PM Go to [email or web address] -  for contact info  [facility] - Office  [phone]

## 2020-05-22 NOTE — ED Notes (Signed)
Call to resp   Pt pulse ox reading up and down   87-93 per cent   Lorriane Shire, RT will come and evaluate

## 2020-05-22 NOTE — ED Notes (Signed)
Reports from [place], RN  Pt has no IV after several sticks  [place], CN, RN in to try [place] IV

## 2020-05-22 NOTE — ED Provider Notes (Signed)
[facility] Provider Note   CSN: [id number] Arrival date & time: 05/22/20  1010     History Chief Complaint  Patient presents with  . Shortness of Breath    Cassandra Parker is a 79 y.o. female.  HPI Patient presents with shortness of breath.  Recent dialysis graft surgery in right upper extremity.  History of CHF.  Also has pacemaker.  Worsening shortness of breath.  Has had 2 days of blood in the stool also.  Is on Eliquis for A. fib.  Has swelling in both of her legs.  This is chronic for her.  Has had a cough with mild sputum production.  No fevers.  Has had Covid vaccine.  No chest pain.  For EMS room air sats were 68%.  Had been on CPAP by EMS.    Past Medical History:  Diagnosis Date  . Acute renal insufficiency 10/15/2013  . Arthritis   . Cancer [facility]) 2008   right breast  . Cancer of breast- s/p chemo and Rt mastectomy 2008 10/13/2013  . Cardiomyopathy ([place])    dating back to 2010  . CHB (complete heart block) ([place]) 10/13/2013  . Chronic combined systolic and diastolic congestive heart failure ([place])   . Diabetes mellitus    with neuropathy  . Diabetes mellitus type 2, controlled, without complications ([place]) 0/63/0160  . Dyslipidemia 10/13/2013  . History of blood transfusion   . Hypercholesterolemia   . Hypertension   . Mitral stenosis- moderate with trivial MR  10/15/2013  . Pacemaker 10/23/2014  . Pneumonia   . Presence of permanent cardiac pacemaker    [employer]  . Primary osteoarthritis of left knee 11/04/2015  . Syncope 10/13/2013  . Tingling    feet bilat     Patient Active Problem List   Diagnosis Date Noted  . Respiratory failure with hypoxia ([place]) 05/22/2020  . Pulmonary embolism--- VQ scan with intermediate probability 12/22/2019  . Chest pain 12/21/2019  . Primary osteoarthritis of left knee 11/04/2015  . Pacemaker 10/23/2014  . Acute renal insufficiency 10/15/2013  . Mitral stenosis- moderate with trivial MR  10/15/2013  . CHB  (complete heart block) ([place]) 10/13/2013  . Syncope 10/13/2013  . HTN (hypertension) 10/13/2013  . Diabetes mellitus type 2, controlled, without complications ([place]) 10/93/2355  . Cancer of breast- s/p chemo and Rt mastectomy 2008 10/13/2013  . Cardiomyopathy- EF 35-40% (etiology not yet determined) 10/13/2013  . Dyslipidemia 10/13/2013    Past Surgical History:  Procedure Laterality Date  . APPENDECTOMY     mmh  . AV FISTULA PLACEMENT Right 05/18/2020   Procedure: RIGHT ARM BRACHIOCEPHALIC ARTERIOVENOUS (AV) FISTULA CREATION;  Surgeon: Elam Dutch, MD;  Location: [facility];  Service: Vascular;  Laterality: Right;  . BI-VENTRICULAR PACEMAKER INSERTION N/A 10/15/2013   Procedure: BI-VENTRICULAR PACEMAKER INSERTION (CRT-P);  Surgeon: Evans Lance, MD;  Location: [facility] CATH LAB;  Service: Cardiovascular;  Laterality: N/A;  . Cardiomyopathy    . CATARACT EXTRACTION W/PHACO  06/29/2011   Procedure: CATARACT EXTRACTION PHACO AND INTRAOCULAR LENS PLACEMENT (IOC);  Surgeon: Tonny Branch;  Location: [facility];  Service: Ophthalmology;  Laterality: Right;  CDE: 14.41  . CATARACT EXTRACTION W/PHACO  09/04/2011   Procedure: CATARACT EXTRACTION PHACO AND INTRAOCULAR LENS PLACEMENT (IOC);  Surgeon: Tonny Branch;  Location: [facility];  Service: Ophthalmology;  Laterality: Left;  CDE: 15.21  . CHOLECYSTECTOMY     mmh  . JOINT REPLACEMENT Left    knee  . KNEE ARTHROSCOPY  right-mmh  . MASTECTOMY  2008    mmh-right  . TEMPORARY PACEMAKER INSERTION N/A 10/13/2013   Procedure: TEMPORARY PACEMAKER INSERTION;  Surgeon: Jolaine Artist, MD;  Location: [facility] CATH LAB;  Service: Cardiovascular;  Laterality: N/A;  . TOTAL KNEE ARTHROPLASTY Left 11/04/2015   Procedure: LEFT TOTAL KNEE ARTHROPLASTY;  Surgeon: Rod Can, MD;  Location: [facility];  Service: Orthopedics;  Laterality: Left;     OB History    Gravida  3   Para  3   Term  3   Preterm      AB      Living        SAB      TAB      Ectopic       Multiple      Live Births              Family History  Problem Relation Age of Onset  . Hypertension Father   . Diabetes Father   . Anesthesia problems Neg Hx   . Hypotension Neg Hx   . Malignant hyperthermia Neg Hx   . Pseudochol deficiency Neg Hx     Social History   Tobacco Use  . Smoking status: Never Smoker  . Smokeless tobacco: Never Used  Vaping Use  . Vaping Use: Never used  Substance Use Topics  . Alcohol use: No    Alcohol/week: 0.0 standard drinks  . Drug use: No    Home Medications Prior to Admission medications   Medication Sig Start Date End Date Taking? Authorizing Provider  acetaminophen (TYLENOL) 325 MG tablet Take 2 tablets (650 mg total) by mouth every 6 (six) hours as needed for mild pain or headache (or Fever >/= 101). 12/23/19  Yes Emokpae, Courage, MD  amLODipine (NORVASC) 5 MG tablet Take 10 mg by mouth daily.   Yes [provider]  apixaban (ELIQUIS) 5 MG TABS tablet Take 5 mg by mouth 2 (two) times daily.    Yes [provider]  carvedilol (COREG) 3.125 MG tablet Take 1 tablet (3.125 mg total) by mouth 2 (two) times daily. 12/23/19  Yes Emokpae, Courage, MD  Cholecalciferol (VITAMIN D) 2000 UNITS tablet Take 2,000 Units by mouth daily.    Yes [provider]  furosemide (LASIX) 40 MG tablet Take 40 mg by mouth daily.    Yes [provider]  glipiZIDE (GLUCOTROL XL) 5 MG 24 hr tablet Take 5 mg by mouth See admin instructions. Take 10 mg in the morning and 5 mg at night   Yes [provider]  hydrALAZINE (APRESOLINE) 50 MG tablet Take 50 mg by mouth 2 (two) times daily.   Yes [provider]  HYDROcodone-acetaminophen (NORCO) 5-325 MG tablet Take 1 tablet by mouth every 6 (six) hours as needed for moderate pain. 05/18/20  Yes Dagoberto Ligas, PA-C  potassium chloride (KLOR-CON) 10 MEQ tablet Take 10 mEq by mouth daily.    Yes [provider]  pravastatin (PRAVACHOL) 10 MG tablet Take  10 mg by mouth at bedtime.    Yes [provider]  vitamin B-12 (CYANOCOBALAMIN) 1000 MCG tablet Take 1,000 mcg by mouth daily.   Yes [provider]    Allergies    Patient has no known allergies.  Review of Systems   Review of Systems  Constitutional: Negative for appetite change.  HENT: Negative for congestion.   Respiratory: Positive for cough and shortness of breath.   Cardiovascular: Positive for leg swelling. Negative for  chest pain.  Gastrointestinal: Positive for blood in stool.  Musculoskeletal: Negative for back pain.  Skin: Negative for wound.  Neurological: Negative for weakness.    Physical Exam Updated Vital Signs BP 134/66   Pulse 76   Temp 98 F (36.7 C) (Oral)   Resp (!) 21   Ht 5\' 3"  (1.6 m)   Wt 105.7 kg   SpO2 94%   BMI 41.27 kg/m   Physical Exam Vitals reviewed.  HENT:     Head: Normocephalic.  Cardiovascular:     Rate and Rhythm: Regular rhythm.  Pulmonary:     Comments: Has rales and wheezes. Chest:     Chest wall: No tenderness.  Abdominal:     Tenderness: There is no abdominal tenderness.  Musculoskeletal:     Right lower leg: Edema present.     Left lower leg: Edema present.     Comments: Moderate pitting edema bilateral lower extremities.  Post surgical right upper extremity.  Skin:    General: Skin is warm.     Capillary Refill: Capillary refill takes less than 2 seconds.  Neurological:     Mental Status: She is alert and oriented to person, place, and time.     ED Results / Procedures / Treatments   Labs (all labs ordered are listed, but only abnormal results are displayed) Labs Reviewed  COMPREHENSIVE METABOLIC PANEL - Abnormal; Notable for the following components:      Result Value   CO2 21 (*)    Glucose, Bld 250 (*)    BUN 73 (*)    Creatinine, Ser 3.56 (*)    Calcium 8.5 (*)    Albumin 3.3 (*)    AST 46 (*)    GFR calc non Af Amer 12 (*)    GFR calc Af Amer 14 (*)    All other components  within normal limits  PROTIME-INR - Abnormal; Notable for the following components:   Prothrombin Time 21.1 (*)    INR 1.9 (*)    All other components within normal limits  CBC WITH DIFFERENTIAL/PLATELET - Abnormal; Notable for the following components:   WBC 11.9 (*)    RBC 3.12 (*)    Hemoglobin 9.1 (*)    HCT 30.1 (*)    Neutro Abs 9.3 (*)    All other components within normal limits  BRAIN NATRIURETIC PEPTIDE - Abnormal; Notable for the following components:   B Natriuretic Peptide 1,462.0 (*)    All other components within normal limits  TROPONIN I (HIGH SENSITIVITY) - Abnormal; Notable for the following components:   Troponin I (High Sensitivity) 114 (*)    All other components within normal limits  TROPONIN I (HIGH SENSITIVITY) - Abnormal; Notable for the following components:   Troponin I (High Sensitivity) 117 (*)    All other components within normal limits  SARS CORONAVIRUS 2 BY RT PCR (HOSPITAL ORDER, [facility])  SAMPLE TO BLOOD BANK    EKG EKG Interpretation  Date/Time:  Saturday May 22 2020 10:27:25 EDT Ventricular Rate:  80 PR Interval:    QRS Duration: 147 QT Interval:  447 QTC Calculation: 516 R Axis:   -106 Text Interpretation: electronically paced Confirmed by Davonna Belling [phone]) on 05/22/2020 10:45:05 AM   Radiology DG Chest Portable 1 View  Result Date: 05/22/2020 CLINICAL DATA:  Shortness of breath, hypoxemia, end-stage renal disease on dialysis, LEFT-sided crackles, RIGHT-side week EXAM: PORTABLE CHEST 1 VIEW COMPARISON:  Portable exam 1042 hours compared  to 12/21/2019 FINDINGS: LEFT subclavian sequential pacemaker with leads projecting over RIGHT atrium, RIGHT ventricle, and coronary sinus. Enlargement of cardiac silhouette with vascular congestion. Minimal LEFT base atelectasis. RIGHT perihilar opacities extending into RIGHT upper lobe favor pneumonia although requiring follow-up until resolution to exclude  underlying nodule. Question mild enlargement of RIGHT hilum versus perihilar infiltrate. Remaining lungs clear. No pleural effusion or pneumothorax. Bones demineralized. IMPRESSION: Enlargement of cardiac silhouette post pacemaker. Questionable RIGHT hilar enlargement/adenopathy versus perihilar infiltrate. RIGHT upper lobe pneumonia; follow-up exams until resolution recommended to exclude underlying pulmonary mass/nodule and hilar adenopathy. Electronically Signed   By: Lavonia Dana M.D.   On: 05/22/2020 11:16    Procedures Procedures (including critical care time)  Medications Ordered in ED Medications  cefTRIAXone (ROCEPHIN) 1 g in sodium chloride 0.9 % 100 mL IVPB (0 g Intravenous Stopped 05/22/20 1230)  azithromycin (ZITHROMAX) 500 mg in sodium chloride 0.9 % 250 mL IVPB (0 mg Intravenous Stopped 05/22/20 1346)    ED Course  I have reviewed the triage vital signs and the nursing notes.  Pertinent labs & imaging results that were available during my care of the patient were reviewed by me and considered in my medical decision making (see chart for details).    MDM Rules/Calculators/A&P                          Patient presents with shortness of breath hypoxia.  Likely multifactorial.  Pneumonia on x-ray.  Also has likely CHF with elevated BNP.  Also has GI bleed with a mild anemia.  Requires oxygen.  Negative Covid test.  On anticoagulation although recently had a break for dialysis graft replaced.  Will admit to hospitalist. Final Clinical Impression(s) / ED Diagnoses Final diagnoses:  Gastrointestinal hemorrhage, unspecified gastrointestinal hemorrhage type  Community acquired pneumonia of right upper lobe of lung  Acute on chronic congestive heart failure, unspecified heart failure type [facility])    Rx / DC Orders ED Discharge Orders    None       Davonna Belling, MD 05/22/20 1525

## 2020-05-23 ENCOUNTER — Inpatient Hospital Stay (HOSPITAL_COMMUNITY): Payer: Medicare HMO

## 2020-05-23 DIAGNOSIS — I361 Nonrheumatic tricuspid (valve) insufficiency: Secondary | ICD-10-CM

## 2020-05-23 DIAGNOSIS — I340 Nonrheumatic mitral (valve) insufficiency: Secondary | ICD-10-CM

## 2020-05-23 DIAGNOSIS — I272 Pulmonary hypertension, unspecified: Secondary | ICD-10-CM | POA: Diagnosis present

## 2020-05-23 LAB — GLUCOSE, CAPILLARY
Glucose-Capillary: 144 mg/dL — ABNORMAL HIGH (ref 70–99)
Glucose-Capillary: 256 mg/dL — ABNORMAL HIGH (ref 70–99)

## 2020-05-23 LAB — BASIC METABOLIC PANEL
Anion gap: 8 (ref 5–15)
BUN: 75 mg/dL — ABNORMAL HIGH (ref 8–23)
CO2: 21 mmol/L — ABNORMAL LOW (ref 22–32)
Calcium: 8.5 mg/dL — ABNORMAL LOW (ref 8.9–10.3)
Chloride: 111 mmol/L (ref 98–111)
Creatinine, Ser: 3.59 mg/dL — ABNORMAL HIGH (ref 0.44–1.00)
GFR calc Af Amer: 14 mL/min — ABNORMAL LOW (ref >60)
GFR calc non Af Amer: 12 mL/min — ABNORMAL LOW (ref >60)
Glucose, Bld: 149 mg/dL — ABNORMAL HIGH (ref 70–99)
Potassium: 4.2 mmol/L (ref 3.5–5.1)
Sodium: 140 mmol/L (ref 135–145)

## 2020-05-23 LAB — CBC
HCT: 29.3 % — ABNORMAL LOW (ref 36.0–46.0)
Hemoglobin: 8.6 g/dL — ABNORMAL LOW (ref 12.0–15.0)
MCH: 28.6 pg (ref 26.0–34.0)
MCHC: 29.4 g/dL — ABNORMAL LOW (ref 30.0–36.0)
MCV: 97.3 fL (ref 80.0–100.0)
Platelets: 264 10*3/uL (ref 150–400)
RBC: 3.01 MIL/uL — ABNORMAL LOW (ref 3.87–5.11)
RDW: 14.8 % (ref 11.5–15.5)
WBC: 10.6 10*3/uL — ABNORMAL HIGH (ref 4.0–10.5)
nRBC: 0 % (ref 0.0–0.2)

## 2020-05-23 LAB — ECHOCARDIOGRAM COMPLETE
Area-P 1/2: 3.28 cm2
Height: 63 in
S' Lateral: 3.01 cm
Weight: 3728 oz

## 2020-05-23 NOTE — Progress Notes (Signed)
  Echocardiogram 2D Echocardiogram has been performed.  Cassandra Parker 05/23/2020, 8:12 AM

## 2020-05-23 NOTE — Progress Notes (Signed)
Patient Demographics:    Cassandra Parker, is a 79 y.o. female, DOB - 19-Mar-1941, EIH:[id number]  Admit date - 05/22/2020   Admitting Physician Murray Wenzlick Denton Brick, MD  Outpatient Primary MD for the patient is [name], [name], MD  LOS - 1  Chief Complaint  Patient presents with  . Shortness of Breath        Subjective:    [name] today has no fevers, no emesis,  No chest pain,   -Dyspnea on exertion persist -Shortness of breath is not worse -Continues to require oxygen  Assessment  & Plan :    Principal Problem:   Sepsis due to PNA Active Problems:   Cardiomyopathy- EF 35-40% (etiology not yet determined)   Respiratory failure with hypoxia (HCC)   Rt U L Pneumonia   GI bleed   HTN (hypertension)   Diabetes mellitus type 2, controlled, without complications (HCC)   Cancer of breast- s/p chemo and Rt mastectomy 2008   Mitral stenosis- moderate with trivial MR   Brief Summary:- 79 y.o. female with CKDIV, s/p right mastectomy and presumably axillary node dissection, s/p pacemaker on the left side, s/p  AV fistula creation on 05/18/2020, HTN, DM2, HLD, History of Complete Heart Block s/p Pacemaker placement, HFpEF, chronic Anemia and h/o PE (11/2019) on Eliquis admitted with acute hypoxic respiratory failure secondary to pneumonia with echo showing severe pulmonary hypertension   A/p 1)Sepsis Secondary to Pneumonia----Patient met sepsis criteria on admission with finding of right-sided pneumonia, leukocytosis,  tachypnea, and hypoxia -No tachycardia most likely due to carvedilol use -Lactic acid was not elevated -Continue IV Rocephin and azithromycin, mucolytics and supplemental oxygen as ordered -Dyspnea on exertion persist  2) GI bleed--- patient with dark stools after restarting Eliquis, --Hgb 10.5 >> 9.1 >>8.0 >>8.6 ( baseline that is usually close to 11) -Give IV Protonix -- stool  for occult blood -- Positive -Transfuse as clinically indicated -Stopped Eliquis -GI consult from Dr. Gala Romney requested for possible endoluminal evaluation  3)HFpEF/chronic diastolic dysfunction CHF/pulmonary hypertension--elevated BNP noted, -Echo from 05/23/2020 with EF of 60 to 65% with severe pulmonary hypertension and grade 1 diastolic dysfunction -PCCM consult requested for severe pulmonary hypertension -Troponin is 114>> 117--- this is consistent with patient prior baseline --- bnp 1,462 no baseline available -Continue Lasix -Chest x-ray without overt failure, hypoxia most likely from #1 above -c/n  Coreg  4)Acute hypoxic respiratory failure/severe pulmonary hypertension --- likely secondary to pneumonia as above #1 --She is found to be hypoxic with O2 sats of 68 % on room air, currently on 4 to 5 L of oxygen---  --previously no hypoxia and patient is not on home O2 --- Manage as above #1  5)RUL PNA-- CAP--- suspect aspiration related, chest x-ray suggestive of right upper lobe pneumonia in the setting of recent anesthesia/sedation for AV fistula creation on 05/18/2020 raises concern about possible aspiration --Manage as above #1  6)H/o PE--diagnosed in March 2021 with VQ scan that was intermediate in the setting of elevated D-dimer -Patient has severe pulmonary hypertension-PCCM consult requested for severe pulmonary hypertension -Patient was unable to do CTA chest at that time due to kidney concerns -He has been compliant with Eliquis except for  holding her Eliquis from 05/14/2020 through 07/18/2020 inclusive to  allow for right upper extremity AV fistula creation, she restarted Eliquis on 05/19/2020 --- Given dark stools and drop in H&H we will hold Eliquis  lower extremity venous Dopplers--without acute DVT  7)History of Complete Heart Block s/p Pacemaker placement -EKG shows paced rhythm. Stable. Routine follow-up as outpatient.  8)CKD IV--creatinine currently 3.5 which  is not far from a recent baseline --Status post AV fistula creation on 05/18/2020 in anticipation of HD in the near term -- renally adjust medications, avoid nephrotoxic agents / dehydration  / hypotension   9)Diabetes mellitus type 2 -Sliding scale insulin coverage with fingerstick monitoring -Hold glipizide for now  10)History of Rt Breast cancer -S/p chemo and Rt mastectomy 2008 Status post right-sided mastectomy and chemotherapy -Stable, follow-up as outpatient  11)Acute on chronicAnemia--at baseline patient has anemia of CKD -Suspect worsening anemia with hemoglobin  --Hgb 10.5 >> 9.1 >>8.0 >>8.6 --baseline close to 11 is due to acute GI bleed/ABLA -IV Protonix as above #2, GI consult pending  code Status:full  Family Communication: (patient is alert, awake and coherent) Previously discussed with daughter   Disposition/Need for in-Hospital Stay- patient unable to be discharged at this time due to --Sepsis treatment for pneumonia requiring Iv antibiotics and Gi Bleed requiring further endoluminal evaluation  Status is: Inpatient  Remains inpatient appropriate because:Sepsis treatment for pneumonia requiring Iv antibiotics and Gi Bleed requiring further endoluminal evaluation   Disposition: The patient is from: Home              Anticipated d/c is to: Home              Anticipated d/c date is: 2 days              Patient currently is not medically stable to d/c. Barriers: Not Clinically Stable- Sepsis treatment for pneumonia requiring Iv antibiotics and Gi Bleed requiring further endoluminal evaluation  Consults  :  Gi  DVT Prophylaxis  :    - SCDs (Gi bleed)  Lab Results  Component Value Date   PLT 264 05/23/2020    Inpatient Medications  Scheduled Meds: . amLODipine  10 mg Oral Daily  . carvedilol  3.125 mg Oral BID  . cholecalciferol  2,000 Units Oral Daily  . furosemide  40 mg Oral Daily  . guaiFENesin  600 mg Oral BID  . hydrALAZINE  50 mg Oral  BID  . insulin aspart  0-5 Units Subcutaneous QHS  . insulin aspart  0-6 Units Subcutaneous TID WC  . ipratropium-albuterol  3 mL Nebulization TID  . multivitamin with minerals  1 tablet Oral Daily  . pantoprazole (PROTONIX) IV  40 mg Intravenous Q12H  . potassium chloride  10 mEq Oral Daily  . pravastatin  10 mg Oral QHS  . sodium chloride flush  3 mL Intravenous Q12H  . vitamin B-12  1,000 mcg Oral Daily   Continuous Infusions: . sodium chloride    . azithromycin 500 mg (05/23/20 0936)  . cefTRIAXone (ROCEPHIN)  IV 2 g (05/23/20 1100)   PRN Meds:.sodium chloride, acetaminophen **OR** acetaminophen, albuterol, HYDROcodone-acetaminophen, labetalol, ondansetron **OR** ondansetron (ZOFRAN) IV, polyethylene glycol, sodium chloride flush, traZODone    Anti-infectives (From admission, onward)   Start     Dose/Rate Route Frequency Ordered Stop   05/23/20 1000  cefTRIAXone (ROCEPHIN) 2 g in sodium chloride 0.9 % 100 mL IVPB        2 g 200 mL/hr over 30 Minutes Intravenous Every 24 hours 05/22/20 1640     05/23/20  1000  azithromycin (ZITHROMAX) 500 mg in sodium chloride 0.9 % 250 mL IVPB        500 mg 250 mL/hr over 60 Minutes Intravenous Every 24 hours 05/22/20 1640     05/22/20 1130  cefTRIAXone (ROCEPHIN) 1 g in sodium chloride 0.9 % 100 mL IVPB        1 g 200 mL/hr over 30 Minutes Intravenous  Once 05/22/20 1129 05/22/20 1230   05/22/20 1130  azithromycin (ZITHROMAX) 500 mg in sodium chloride 0.9 % 250 mL IVPB        500 mg 250 mL/hr over 60 Minutes Intravenous  Once 05/22/20 1129 05/22/20 1346        Objective:   Vitals:   05/23/20 0600 05/23/20 0856 05/23/20 0929 05/23/20 0929  BP: (!) 148/63  (!) 154/59 (!) 154/59  Pulse: 83   84  Resp: 20   20  Temp: 98.2 F (36.8 C)     TempSrc: Oral     SpO2: 94% (S) (!) 81%  94%  Weight:      Height:        Wt Readings from Last 3 Encounters:  05/22/20 105.7 kg  05/18/20 103.4 kg  05/14/20 103.7 kg     Intake/Output  Summary (Last 24 hours) at 05/23/2020 1426 Last data filed at 05/23/2020 0931 Gross per 24 hour  Intake 3 ml  Output --  Net 3 ml   Physical Exam  Gen:- Awake Alert,  In no apparent distress , morbidly obese HEENT:- [place].AT, No sclera icterus Nose- [place] 4L/min Neck-Supple Neck,No JVD,.  Lungs-diminished in bases, scattered rhonchi  CV- S1, S2 normal, regular  Abd-  +ve B.Sounds, Abd Soft, increased truncal adiposity Extremity/Skin:-Bilateral lower extremity edema right more than left, pedal pulses present Psych-affect is appropriate, oriented x3 Neuro-generalized weakness ,no new focal deficits, no tremors   Data Review:   Micro Results Recent Results (from the past 240 hour(s))  SARS CORONAVIRUS 2 (TAT 6-24 HRS) Nasopharyngeal Nasopharyngeal Swab     Status: None   Collection Time: 05/17/20  9:53 AM   Specimen: Nasopharyngeal Swab  Result Value Ref Range Status   SARS Coronavirus 2 NEGATIVE NEGATIVE Final    Comment: (NOTE) SARS-CoV-2 target nucleic acids are NOT DETECTED.  The SARS-CoV-2 RNA is generally detectable in upper and lower respiratory specimens during the acute phase of infection. Negative results do not preclude SARS-CoV-2 infection, do not rule out co-infections with other pathogens, and should not be used as the sole basis for treatment or other patient management decisions. Negative results must be combined with clinical observations, patient history, and epidemiological information. The expected result is Negative.  Fact Sheet for Patients: SugarRoll.be  Fact Sheet for Healthcare Providers: https://www.woods-mathews.com/  This test is not yet approved or cleared by the [place] FDA and  has been authorized for detection and/or diagnosis of SARS-CoV-2 by FDA under an Emergency Use Authorization (EUA). This EUA will remain  in effect (meaning this test can be used) for the duration of the COVID-19 declaration  under Se ction 564(b)(1) of the Act, 21 U.S.C. section 360bbb-3(b)(1), unless the authorization is terminated or revoked sooner.  Performed at [facility], [place] [address] [place], [place] [address]   Culture, blood (Routine X 2) w Reflex to ID Panel     Status: None (Preliminary result)   Collection Time: 05/22/20 11:17 AM   Specimen: Left Antecubital; Blood  Result Value Ref Range Status   Specimen Description LEFT ANTECUBITAL  Final   Special Requests   Final    BOTTLES DRAWN AEROBIC AND ANAEROBIC Blood Culture adequate volume   Culture   Final    NO GROWTH < 24 HOURS Performed at [facility], [address], [place], [place] [address]    Report Status PENDING  Incomplete  SARS Coronavirus 2 by RT PCR (hospital order, performed in [facility] hospital lab) Nasopharyngeal Nasopharyngeal Swab     Status: None   Collection Time: 05/22/20 12:01 PM   Specimen: Nasopharyngeal Swab  Result Value Ref Range Status   SARS Coronavirus 2 NEGATIVE NEGATIVE Final    Comment: (NOTE) SARS-CoV-2 target nucleic acids are NOT DETECTED.  The SARS-CoV-2 RNA is generally detectable in upper and lower respiratory specimens during the acute phase of infection. The lowest concentration of SARS-CoV-2 viral copies this assay can detect is 250 copies / mL. A negative result does not preclude SARS-CoV-2 infection and should not be used as the sole basis for treatment or other patient management decisions.  A negative result may occur with improper specimen collection / handling, submission of specimen other than nasopharyngeal swab, presence of viral mutation(s) within the areas targeted by this assay, and inadequate number of viral copies (<250 copies / mL). A negative result must be combined with clinical observations, patient history, and epidemiological information.  Fact Sheet for Patients:   StrictlyIdeas.no  Fact Sheet for Healthcare  Providers: BankingDealers.co.za  This test is not yet approved or  cleared by the [place] FDA and has been authorized for detection and/or diagnosis of SARS-CoV-2 by FDA under an Emergency Use Authorization (EUA).  This EUA will remain in effect (meaning this test can be used) for the duration of the COVID-19 declaration under Section 564(b)(1) of the Act, 21 U.S.C. section 360bbb-3(b)(1), unless the authorization is terminated or revoked sooner.  Performed at [facility], [address] [place], [place] [address]   Culture, blood (Routine X 2) w Reflex to ID Panel     Status: None (Preliminary result)   Collection Time: 05/22/20  6:40 PM   Specimen: BLOOD  Result Value Ref Range Status   Specimen Description BLOOD BLOOD LEFT ARM  Final   Special Requests   Final    BOTTLES DRAWN AEROBIC AND ANAEROBIC Blood Culture results may not be optimal due to an inadequate volume of blood received in culture bottles   Culture   Final    NO GROWTH < 24 HOURS Performed at [facility], [address], [place], [place] [address]    Report Status PENDING  Incomplete    Radiology Reports US Venous Img Lower Bilateral (DVT)  Result Date: 05/23/2020 CLINICAL DATA:  History of pulmonary embolism. Evaluate for lower extremity DVT. EXAM: BILATERAL LOWER EXTREMITY VENOUS DOPPLER ULTRASOUND TECHNIQUE: Gray-scale sonography with graded compression, as well as color Doppler and duplex ultrasound were performed to evaluate the lower extremity deep venous systems from the level of the common femoral vein and including the common femoral, femoral, profunda femoral, popliteal and calf veins including the posterior tibial, peroneal and gastrocnemius veins when visible. The superficial great saphenous vein was also interrogated. Spectral Doppler was utilized to evaluate flow at rest and with distal augmentation maneuvers in the common femoral, femoral and popliteal veins. COMPARISON:   12/22/2019 FINDINGS: RIGHT LOWER EXTREMITY Common Femoral Vein: No evidence of thrombus. Normal compressibility, respiratory phasicity and response to augmentation. Saphenofemoral Junction: No evidence of thrombus. Normal compressibility and flow on color Doppler imaging. Profunda Femoral Vein: No  evidence of thrombus. Normal compressibility and flow on color Doppler imaging. Femoral Vein: No evidence of thrombus. Normal compressibility, respiratory phasicity and response to augmentation. Popliteal Vein: No evidence of thrombus. Normal compressibility, respiratory phasicity and response to augmentation. Calf Veins: Visualized right deep calf veins are patent without thrombus. Limited evaluation. Superficial Great Saphenous Vein: No evidence of thrombus. Normal compressibility. Other Findings:  None. LEFT LOWER EXTREMITY Common Femoral Vein: No evidence of thrombus. Normal compressibility, respiratory phasicity and response to augmentation. Saphenofemoral Junction: No evidence of thrombus. Normal compressibility and flow on color Doppler imaging. Profunda Femoral Vein: No evidence of thrombus. Normal compressibility and flow on color Doppler imaging. Femoral Vein: No evidence of thrombus. Normal compressibility, respiratory phasicity and response to augmentation. Popliteal Vein: No evidence of thrombus. Normal compressibility, respiratory phasicity and response to augmentation. Calf Veins: Visualized left deep calf veins are patent without thrombus. Limited evaluation. Superficial Great Saphenous Vein: No evidence of thrombus. Normal compressibility. Other Findings:  None. IMPRESSION: No evidence of deep venous thrombosis in either lower extremity. Electronically Signed   By: Markus Daft M.D.   On: 05/23/2020 12:21   DG Chest Portable 1 View  Result Date: 05/22/2020 CLINICAL DATA:  Shortness of breath, hypoxemia, end-stage renal disease on dialysis, LEFT-sided crackles, RIGHT-side week EXAM: PORTABLE CHEST 1 VIEW  COMPARISON:  Portable exam 1042 hours compared to 12/21/2019 FINDINGS: LEFT subclavian sequential pacemaker with leads projecting over RIGHT atrium, RIGHT ventricle, and coronary sinus. Enlargement of cardiac silhouette with vascular congestion. Minimal LEFT base atelectasis. RIGHT perihilar opacities extending into RIGHT upper lobe favor pneumonia although requiring follow-up until resolution to exclude underlying nodule. Question mild enlargement of RIGHT hilum versus perihilar infiltrate. Remaining lungs clear. No pleural effusion or pneumothorax. Bones demineralized. IMPRESSION: Enlargement of cardiac silhouette post pacemaker. Questionable RIGHT hilar enlargement/adenopathy versus perihilar infiltrate. RIGHT upper lobe pneumonia; follow-up exams until resolution recommended to exclude underlying pulmonary mass/nodule and hilar adenopathy. Electronically Signed   By: Lavonia Dana M.D.   On: 05/22/2020 11:16   VAS [place] UPPER EXTREMITY ARTERIAL DUPLEX  Result Date: 04/29/2020 UPPER EXTREMITY DUPLEX STUDY Indications: Patient complains of HD access pre op.  Performing Technologist: Ralene Cork RVT  Examination Guidelines: A complete evaluation includes B-mode imaging, spectral Doppler, color Doppler, and power Doppler as needed of all accessible portions of each vessel. Bilateral testing is considered an integral part of a complete examination. Limited examinations for reoccurring indications may be performed as noted.  Right Pre-Dialysis Findings: +-----------------------+----------+--------------------+---------+--------+ Location               PSV (cm/s)Intralum. Diam. (cm)Waveform Comments +-----------------------+----------+--------------------+---------+--------+ Brachial Antecub. fossa98        0.50                triphasic         +-----------------------+----------+--------------------+---------+--------+ Radial Art at Wrist    76        0.19                triphasic          +-----------------------+----------+--------------------+---------+--------+ Ulnar Art at Wrist     66        0.16                triphasic         +-----------------------+----------+--------------------+---------+--------+ Left Pre-Dialysis Findings: +-----------------------+----------+--------------------+---------+--------+ Location               PSV (cm/s)Intralum. Diam. (cm)Waveform Comments +-----------------------+----------+--------------------+---------+--------+ Brachial Antecub. fossa81  0.52                triphasic         +-----------------------+----------+--------------------+---------+--------+ Radial Art at Wrist    58        0.21                triphasic         +-----------------------+----------+--------------------+---------+--------+ Ulnar Art at Wrist     61        0.15                triphasic         +-----------------------+----------+--------------------+---------+--------+  Summary:  Right: Patent brachial, radial, and ulnar arteries with calcified        walls in the radial and ulnar arteries. Left: Patent brachial, radial, and ulnar arteries with calcified       walls in the radial and ulnar arteries. *See table(s) above for measurements and observations. Electronically signed by Ruta Hinds MD on 04/29/2020 at 11:57:00 AM.    Final    ECHOCARDIOGRAM COMPLETE  Result Date: 05/23/2020    ECHOCARDIOGRAM REPORT   Patient Name:   JASMIA ANGST Date of Exam: 05/23/2020 Medical Rec #:  [id number]          Height:       63.0 in Accession #:    [id number]         Weight:       233.0 lb Date of Birth:  [birth year]         BSA:          2.063 m Patient Age:    22 years           BP:           148/63 mmHg Patient Gender: F                  HR:           83 bpm. Exam Location:  [facility] Procedure: 2D Echo, Cardiac Doppler and Color Doppler Indications:    Dyspnea  History:        Patient has prior history of Echocardiogram examinations, most                  recent 12/23/2019. CHF, Pacemaker, Mitral Valve Disease,                 Signs/Symptoms:Shortness of Breath; Risk Factors:Diabetes and                 Hypertension. Sepsis, pneumonia.  Sonographer:    Dustin Flock RDCS Referring Phys: [place]  1. Left ventricular ejection fraction, by estimation, is 60 to 65%. The left ventricle has normal function. The left ventricle has no regional wall motion abnormalities. There is moderate left ventricular hypertrophy. Left ventricular diastolic parameters are consistent with Grade I diastolic dysfunction (impaired relaxation).  2. Right ventricular systolic function is normal. The right ventricular size is normal. There is severely elevated pulmonary artery systolic pressure. The estimated right ventricular systolic pressure is 03.1 mmHg. Device wire present.  3. Left atrial size was mildly dilated.  4. The mitral valve is abnormal, mildly calcified and with moderate annular calcification. Mild mitral valve regurgitation.  5. The aortic valve is tricuspid. Aortic valve regurgitation is not visualized.  6. The inferior vena cava is dilated in size with <50% respiratory variability, suggesting right atrial pressure of 15 mmHg. FINDINGS  Left Ventricle: Left ventricular  ejection fraction, by estimation, is 60 to 65%. The left ventricle has normal function. The left ventricle has no regional wall motion abnormalities. The left ventricular internal cavity size was normal in size. There is  moderate left ventricular hypertrophy. Left ventricular diastolic parameters are consistent with Grade I diastolic dysfunction (impaired relaxation). Right Ventricle: The right ventricular size is normal. No increase in right ventricular wall thickness. Right ventricular systolic function is normal. There is severely elevated pulmonary artery systolic pressure. The tricuspid regurgitant velocity is 3.49 m/s, and with an assumed right atrial pressure of 15  mmHg, the estimated right ventricular systolic pressure is 74.8 mmHg. Left Atrium: Left atrial size was mildly dilated. Right Atrium: Right atrial size was normal in size. Pericardium: There is no evidence of pericardial effusion. Mitral Valve: The mitral valve is abnormal. There is mild calcification of the mitral valve leaflet(s). Moderate mitral annular calcification. Mild mitral valve regurgitation. Tricuspid Valve: The tricuspid valve is grossly normal. Tricuspid valve regurgitation is mild. Aortic Valve: The aortic valve is tricuspid. Aortic valve regurgitation is not visualized. Mild aortic valve annular calcification. Pulmonic Valve: The pulmonic valve was grossly normal. Pulmonic valve regurgitation is mild. Aorta: The aortic root is normal in size and structure. Venous: The inferior vena cava is dilated in size with less than 50% respiratory variability, suggesting right atrial pressure of 15 mmHg. IAS/Shunts: No atrial level shunt detected by color flow Doppler. Additional Comments: A pacer wire is visualized.  LEFT VENTRICLE PLAX 2D LVIDd:         4.24 cm  Diastology LVIDs:         3.01 cm  LV e' lateral:   3.59 cm/s LV PW:         1.50 cm  LV E/e' lateral: 33.4 LV IVS:        1.52 cm  LV e' medial:    5.44 cm/s LVOT diam:     2.00 cm  LV E/e' medial:  22.1 LV SV:         93 LV SV Index:   45 LVOT Area:     3.14 cm  RIGHT VENTRICLE RV Basal diam:  3.78 cm RV S prime:     9.90 cm/s TAPSE (M-mode): 3.2 cm LEFT ATRIUM             Index       RIGHT ATRIUM           Index LA diam:        4.30 cm 2.08 cm/m  RA Area:     21.90 cm LA Vol (A2C):   52.2 ml 25.30 ml/m RA Volume:   67.60 ml  32.76 ml/m LA Vol (A4C):   74.4 ml 36.06 ml/m LA Biplane Vol: 69.1 ml 33.49 ml/m  AORTIC VALVE LVOT Vmax:   130.00 cm/s LVOT Vmean:  96.800 cm/s LVOT VTI:    0.295 m  AORTA Ao Root diam: 3.00 cm MITRAL VALVE                TRICUSPID VALVE MV Area (PHT): 3.28 cm     TR Peak grad:   48.7 mmHg MV Decel Time: 231 msec     TR  Vmax:        349.00 cm/s MV E velocity: 120.00 cm/s MV A velocity: 125.00 cm/s  SHUNTS MV E/A ratio:  0.96         Systemic VTI:  0.30 m  Systemic Diam: 2.00 cm Rozann Lesches MD Electronically signed by Rozann Lesches MD Signature Date/Time: 05/23/2020/11:07:51 AM    Final    VAS [place] UPPER EXTREMITY VEIN MAPPING  Result Date: 04/29/2020 UPPER EXTREMITY VEIN MAPPING  Indications: Pre-access. Performing Technologist: Ralene Cork RVT  Examination Guidelines: A complete evaluation includes B-mode imaging, spectral Doppler, color Doppler, and power Doppler as needed of all accessible portions of each vessel. Bilateral testing is considered an integral part of a complete examination. Limited examinations for reoccurring indications may be performed as noted. +-----------------+-------------+----------+--------+ Right Cephalic   Diameter (cm)Depth (cm)Findings +-----------------+-------------+----------+--------+ Shoulder             0.55                        +-----------------+-------------+----------+--------+ Prox upper arm       0.53                        +-----------------+-------------+----------+--------+ Mid upper arm        0.48                        +-----------------+-------------+----------+--------+ Dist upper arm       0.47                        +-----------------+-------------+----------+--------+ Antecubital fossa    0.66                        +-----------------+-------------+----------+--------+ Prox forearm         0.34                        +-----------------+-------------+----------+--------+ Mid forearm          0.30                        +-----------------+-------------+----------+--------+ Dist forearm         0.24               lateral  +-----------------+-------------+----------+--------+ +-----------------+-------------+----------+--------+ Right Basilic    Diameter (cm)Depth (cm)Findings  +-----------------+-------------+----------+--------+ Mid upper arm        0.43                        +-----------------+-------------+----------+--------+ Dist upper arm       0.52                        +-----------------+-------------+----------+--------+ Antecubital fossa    0.28                        +-----------------+-------------+----------+--------+ +-----------------+-------------+----------+--------------+ Left Cephalic    Diameter (cm)Depth (cm)   Findings    +-----------------+-------------+----------+--------------+ Shoulder             0.28                              +-----------------+-------------+----------+--------------+ Prox upper arm       0.18                              +-----------------+-------------+----------+--------------+ Mid upper arm        0.18                              +-----------------+-------------+----------+--------------+  Dist upper arm       0.22                              +-----------------+-------------+----------+--------------+ Antecubital fossa    0.25                              +-----------------+-------------+----------+--------------+ Prox forearm         0.16                              +-----------------+-------------+----------+--------------+ Mid forearm          0.12                              +-----------------+-------------+----------+--------------+ Dist forearm                            not visualized +-----------------+-------------+----------+--------------+ +-----------------+-------------+----------+--------+ Left Basilic     Diameter (cm)Depth (cm)Findings +-----------------+-------------+----------+--------+ Mid upper arm        0.38                        +-----------------+-------------+----------+--------+ Dist upper arm       0.48                        +-----------------+-------------+----------+--------+ Antecubital fossa    0.25                         +-----------------+-------------+----------+--------+ Summary: Right: Patent cephalic and basilic veins. Left: Patent cephalic and basilic veins. *See table(s) above for measurements and observations.  Diagnosing physician: Ruta Hinds MD Electronically signed by Ruta Hinds MD on 04/29/2020 at 11:57:08 AM.    Final      CBC Recent Labs  Lab 05/18/20 0827 05/22/20 1115 05/22/20 2129 05/23/20 0735  WBC  --  11.9*  --  10.6*  HGB 10.5* 9.1* 8.0* 8.6*  HCT 31.0* 30.1* 26.8* 29.3*  PLT  --  247  --  264  MCV  --  96.5  --  97.3  MCH  --  29.2  --  28.6  MCHC  --  30.2  --  29.4*  RDW  --  14.9  --  14.8  LYMPHSABS  --  1.7  --   --   MONOABS  --  0.9  --   --   EOSABS  --  0.0  --   --   BASOSABS  --  0.1  --   --     Chemistries  Recent Labs  Lab 05/18/20 0827 05/22/20 1115 05/23/20 0735  NA 144 138 140  K 4.2 4.2 4.2  CL 114* 109 111  CO2  --  21* 21*  GLUCOSE 165* 250* 149*  BUN 54* 73* 75*  CREATININE 3.30* 3.56* 3.59*  CALCIUM  --  8.5* 8.5*  AST  --  46*  --   ALT  --  34  --   ALKPHOS  --  67  --   BILITOT  --  0.6  --    ------------------------------------------------------------------------------------------------------------------ No results for input(s): CHOL, HDL, LDLCALC, TRIG, CHOLHDL, LDLDIRECT in the last 72 hours.  Lab Results  Component Value  Date   HGBA1C 7.4 (H) 05/22/2020   ------------------------------------------------------------------------------------------------------------------ No results for input(s): TSH, T4TOTAL, T3FREE, THYROIDAB in the last 72 hours.  Invalid input(s): FREET3 ------------------------------------------------------------------------------------------------------------------ No results for input(s): VITAMINB12, FOLATE, FERRITIN, TIBC, IRON, RETICCTPCT in the last 72 hours.  Coagulation profile Recent Labs  Lab 05/22/20 1115  INR 1.9*    No results for input(s): DDIMER in the last 72 hours.  Cardiac  Enzymes No results for input(s): CKMB, TROPONINI, MYOGLOBIN in the last 168 hours.  Invalid input(s): CK ------------------------------------------------------------------------------------------------------------------    Component Value Date/Time   BNP 1,462.0 (H) 05/22/2020 1115     Roxan Hockey M.D on 05/23/2020 at 2:26 PM  Go to [email or web address] - for contact info  [facility] - Office  [phone]

## 2020-05-23 NOTE — TOC Initial Note (Signed)
Transition of Care [facility]) - Initial/Assessment Note    Patient Details  Name: Cassandra Parker MRN: [id number] Date of Birth: 1941-03-11  Transition of [facility]) CM/SW Contact:    Natasha Bence, LCSW Phone Number: 05/23/2020, 2:25 PM  Clinical Narrative:                 Patient is a 79 year old female admitted for sepsis. CSW assessed patient for potential HH, SNF, and DME needs. Patient reported that she utilizes a walker and BSC periodically at home. Patient also reports that she has a ramp to access her home. CSW inquired about patient's utilization of HH and Skilled Nursing. Patient expressed that she had not utilized [facility] or SNF but would be agreeable to accepting services if recommended. TOC to follow.    Barriers to Discharge: Continued Medical Work up   Patient Goals and CMS Choice   CMS Medicare.gov Compare Post Acute Care list provided to:: Patient Choice offered to / list presented to : Patient  Expected Discharge Plan and Services         Living arrangements for the past 2 months: Single Family Home (Has ramp)                                      Prior Living Arrangements/Services Living arrangements for the past 2 months: Single Family Home (Has ramp) Lives with:: Self, Adult Children Patient language and need for interpreter reviewed:: Yes Do you feel safe going back to the place where you live?: Yes      Need for Family Participation in Patient Care: Yes (Comment) Care giver support system in place?: Yes (comment) Current home services: DME (Walker and bsc) Criminal Activity/Legal Involvement Pertinent to Current Situation/Hospitalization: No - Comment as needed  Activities of Daily Living Home Assistive Devices/Equipment: Eyeglasses, Dentures (specify type) ADL Screening (condition at time of admission) Patient's cognitive ability adequate to safely complete daily activities?: Yes Is the patient deaf or have difficulty hearing?: No Does the  patient have difficulty seeing, even when wearing glasses/contacts?: No Does the patient have difficulty concentrating, remembering, or making decisions?: No Patient able to express need for assistance with ADLs?: Yes Does the patient have difficulty dressing or bathing?: No Independently performs ADLs?: Yes (appropriate for developmental age) Does the patient have difficulty walking or climbing stairs?: Yes Weakness of Legs: Both Weakness of Arms/Hands: Both  Permission Sought/Granted Permission sought to share information with : Family Supports Permission granted to share information with : Yes, Verbal Permission Granted     Permission granted to share info w AGENCY: local refered HH and SNF agencies  Permission granted to share info w Relationship: Daughter     Emotional Assessment     Affect (typically observed): Accepting, Adaptable Orientation: : Oriented to Self, Oriented to Situation, Oriented to Place, Oriented to  Time Alcohol / Substance Use: Not Applicable Psych Involvement: No (comment)  Admission diagnosis:  Respiratory failure with hypoxia (HCC) [J96.91] Gastrointestinal hemorrhage, unspecified gastrointestinal hemorrhage type [K92.2] Community acquired pneumonia of right upper lobe of lung [J18.9] Acute on chronic congestive heart failure, unspecified heart failure type [facility]) [I50.9] Patient Active Problem List   Diagnosis Date Noted  . Respiratory failure with hypoxia ([place]) 05/22/2020  . Rt U L Pneumonia 05/22/2020  . GI bleed 05/22/2020  . Sepsis due to PNA 05/22/2020  . Pulmonary embolism--- VQ scan with intermediate probability 12/22/2019  .  Chest pain 12/21/2019  . Primary osteoarthritis of left knee 11/04/2015  . Pacemaker 10/23/2014  . Acute renal insufficiency 10/15/2013  . Mitral stenosis- moderate with trivial MR  10/15/2013  . CHB (complete heart block) ([place]) 10/13/2013  . Syncope 10/13/2013  . HTN (hypertension) 10/13/2013  . Diabetes mellitus  type 2, controlled, without complications ([place]) 90/08/2240  . Cancer of breast- s/p chemo and Rt mastectomy 2008 10/13/2013  . Cardiomyopathy- EF 35-40% (etiology not yet determined) 10/13/2013  . Dyslipidemia 10/13/2013   PCP:  Manon Hilding, MD Pharmacy:   [id number] - [place], [place] [address] [place] [address] Phone: [phone] Fax: [place] [place], [place] [address] Phone: [phone] Fax: [phone]     Social Determinants of Health (SDOH) Interventions    Readmission Risk Interventions No flowsheet data found.

## 2020-05-24 ENCOUNTER — Encounter (HOSPITAL_COMMUNITY): Payer: Self-pay | Admitting: Family Medicine

## 2020-05-24 ENCOUNTER — Inpatient Hospital Stay (HOSPITAL_COMMUNITY): Payer: Medicare HMO

## 2020-05-24 DIAGNOSIS — I429 Cardiomyopathy, unspecified: Secondary | ICD-10-CM

## 2020-05-24 DIAGNOSIS — K921 Melena: Secondary | ICD-10-CM

## 2020-05-24 DIAGNOSIS — I272 Pulmonary hypertension, unspecified: Secondary | ICD-10-CM

## 2020-05-24 DIAGNOSIS — I10 Essential (primary) hypertension: Secondary | ICD-10-CM

## 2020-05-24 DIAGNOSIS — J9601 Acute respiratory failure with hypoxia: Secondary | ICD-10-CM

## 2020-05-24 LAB — CBC
HCT: 27.2 % — ABNORMAL LOW (ref 36.0–46.0)
Hemoglobin: 8.2 g/dL — ABNORMAL LOW (ref 12.0–15.0)
MCH: 29.6 pg (ref 26.0–34.0)
MCHC: 30.1 g/dL (ref 30.0–36.0)
MCV: 98.2 fL (ref 80.0–100.0)
Platelets: 267 10*3/uL (ref 150–400)
RBC: 2.77 MIL/uL — ABNORMAL LOW (ref 3.87–5.11)
RDW: 14.8 % (ref 11.5–15.5)
WBC: 10.5 10*3/uL (ref 4.0–10.5)
nRBC: 0 % (ref 0.0–0.2)

## 2020-05-24 LAB — GLUCOSE, CAPILLARY
Glucose-Capillary: 134 mg/dL — ABNORMAL HIGH (ref 70–99)
Glucose-Capillary: 225 mg/dL — ABNORMAL HIGH (ref 70–99)
Glucose-Capillary: 143 mg/dL — ABNORMAL HIGH (ref 70–99)
Glucose-Capillary: 158 mg/dL — ABNORMAL HIGH (ref 70–99)
Glucose-Capillary: 166 mg/dL — ABNORMAL HIGH (ref 70–99)
Glucose-Capillary: 165 mg/dL — ABNORMAL HIGH (ref 70–99)

## 2020-05-24 LAB — BASIC METABOLIC PANEL
Anion gap: 8 (ref 5–15)
BUN: 68 mg/dL — ABNORMAL HIGH (ref 8–23)
CO2: 22 mmol/L (ref 22–32)
Calcium: 8.3 mg/dL — ABNORMAL LOW (ref 8.9–10.3)
Chloride: 111 mmol/L (ref 98–111)
Creatinine, Ser: 3.57 mg/dL — ABNORMAL HIGH (ref 0.44–1.00)
GFR calc Af Amer: 14 mL/min — ABNORMAL LOW (ref >60)
GFR calc non Af Amer: 12 mL/min — ABNORMAL LOW (ref >60)
Glucose, Bld: 134 mg/dL — ABNORMAL HIGH (ref 70–99)
Potassium: 4.4 mmol/L (ref 3.5–5.1)
Sodium: 141 mmol/L (ref 135–145)

## 2020-05-24 NOTE — Progress Notes (Signed)
Patient Demographics:    Cassandra Parker, is a 79 y.o. female, DOB - December 28, 1940, MDY:[id number]  Admit date - 05/22/2020   Admitting Physician Wallie Lagrand Denton Brick, MD  Outpatient Primary MD for the patient is [name], [name], MD  LOS - 2  Chief Complaint  Patient presents with  . Shortness of Breath        Subjective:    Cassandra Parker today has no fevers, no emesis,  No chest pain,   -daughter is at bedside shob and hypoxia and cough persist   Assessment  & Plan :    Principal Problem:   Sepsis due to PNA Active Problems:   Cardiomyopathy- EF 35-40% (etiology not yet determined)   Respiratory failure with hypoxia (HCC)   Rt U L Pneumonia   GI bleed   HTN (hypertension)   Diabetes mellitus type 2, controlled, without complications (HCC)   Cancer of breast- s/p chemo and Rt mastectomy 2008   Mitral stenosis- moderate with trivial MR    Severe Pulmonary HTN--  Brief Summary:- 79 y.o. female with CKDIV, s/p right mastectomy and presumably axillary node dissection, s/p pacemaker on the left side, s/p  AV fistula creation on 05/18/2020, HTN, DM2, HLD, History of Complete Heart Block s/p Pacemaker placement, HFpEF, chronic Anemia and h/o PE (11/2019) on Eliquis admitted with acute hypoxic respiratory failure secondary to pneumonia with echo showing severe pulmonary hypertension  A/p 1)Sepsis Secondary to Pneumonia----Patient met sepsis criteria on admission with finding of right-sided pneumonia, leukocytosis,  tachypnea, and hypoxia -No tachycardia most likely due to carvedilol use -Lactic acid was not elevated -shob and hypoxia and cough persist -WBC 11.9 >> 10.5 CXR on 05/24/20 with persistent right-sided pneumonia -Continue IV Rocephin and azithromycin, mucolytics and supplemental oxygen as ordered   2) GI bleed--- patient with dark stools after restarting Eliquis, --Hgb 10.5 >> 9.1 >>8.0  >>8.6>>8.2  ( baseline that is usually close to 11) -c/n IV Protonix -- stool for occult blood -- Positive -Transfuse as clinically indicated -Stopped Eliquis -GI consult from Dr. Gala Romney requested for possible endoluminal evaluation  3)HFpEF/chronic diastolic dysfunction CHF/pulmonary hypertension--elevated BNP noted, -Echo from 05/23/2020 with EF of 60 to 65% with severe pulmonary hypertension and grade 1 diastolic dysfunction -PCCM consult requested for severe pulmonary hypertension -Troponin is 114>> 117--- this is consistent with patient prior baseline --- bnp 1,462 no baseline available -Continue Lasix -Chest x-ray without overt failure, hypoxia most likely from #1 above -c/n  Coreg  4)Acute hypoxic respiratory failure/severe pulmonary hypertension --- likely secondary to pneumonia as above #1 --She is found to be hypoxic with O2 sats of 68 % on room air, currently on 6 L of oxygen---  --previously no hypoxia and PTA patient was not on home O2 --- Manage as above #1 -PCCM consult requested  5)RUL PNA-- CAP--- suspect aspiration related, chest x-ray suggestive of right upper lobe pneumonia in the setting of recent anesthesia/sedation for AV fistula creation on 05/18/2020 raises concern about possible aspiration -Repeat CXR on 05/24/20 noted --Manage as above #1  6)H/o PE--diagnosed in March 2021 with VQ scan that was intermediate in the setting of elevated D-dimer -Patient has severe pulmonary hypertension-PCCM consult requested for severe pulmonary hypertension -Patient was unable to do CTA chest at that  time due to kidney concerns -He has been compliant with Eliquis except for  holding her Eliquis from 05/14/2020 through 07/18/2020 inclusive to allow for right upper extremity AV fistula creation, she restarted Eliquis on 05/19/2020 --- Given dark stools and drop in H&H we will hold Eliquis  --lower extremity venous Dopplers--without acute DVT  7)History of Complete Heart Block  s/p Pacemaker placement -EKG shows paced rhythm. Stable. Routine follow-up as outpatient.  8)CKD IV--creatinine currently 3.5 which is not far from a recent baseline --Status post AV fistula creation on 05/18/2020 in anticipation of HD in the near term -- renally adjust medications, avoid nephrotoxic agents / dehydration  / hypotension   9)Diabetes mellitus type 2 -Sliding scale insulin coverage with fingerstick monitoring -Hold glipizide for now  10)History of Rt Breast cancer -S/p chemo and Rt mastectomy 2008 Status post right-sided mastectomy and chemotherapy -Stable, follow-up as outpatient  11)Acute on chronicAnemia--at baseline patient has anemia of CKD -Suspect worsening anemia with hemoglobin  --Hgb 10.5 >> 9.1 >>8.0 >>8.6>>8.2 --baseline close to 11 is due to acute GI bleed/ABLA -IV Protonix as above #2, GI consult pending  code Status:full  Family Communication: (patient is alert, awake and coherent) Previously discussed with daughter   Disposition/Need for in-Hospital Stay- patient unable to be discharged at this time due to --Sepsis treatment for pneumonia requiring Iv antibiotics and Gi Bleed requiring further endoluminal evaluation  Status is: Inpatient  Remains inpatient appropriate because:Sepsis treatment for pneumonia requiring Iv antibiotics and Gi Bleed requiring further endoluminal evaluation   Disposition: The patient is from: Home              Anticipated d/c is to: Home              Anticipated d/c date is: 2 days              Patient currently is not medically stable to d/c. Barriers: Not Clinically Stable- Sepsis treatment for pneumonia requiring Iv antibiotics and Gi Bleed requiring further endoluminal evaluation  Consults  :  Gi  DVT Prophylaxis  :    - SCDs (Gi bleed)  Lab Results  Component Value Date   PLT 267 05/24/2020    Inpatient Medications  Scheduled Meds: . amLODipine  10 mg Oral Daily  . carvedilol  3.125 mg Oral  BID  . cholecalciferol  2,000 Units Oral Daily  . furosemide  40 mg Oral Daily  . guaiFENesin  600 mg Oral BID  . hydrALAZINE  50 mg Oral BID  . insulin aspart  0-5 Units Subcutaneous QHS  . insulin aspart  0-6 Units Subcutaneous TID WC  . ipratropium-albuterol  3 mL Nebulization TID  . multivitamin with minerals  1 tablet Oral Daily  . pantoprazole (PROTONIX) IV  40 mg Intravenous Q12H  . potassium chloride  10 mEq Oral Daily  . pravastatin  10 mg Oral QHS  . sodium chloride flush  3 mL Intravenous Q12H  . vitamin B-12  1,000 mcg Oral Daily   Continuous Infusions: . sodium chloride    . azithromycin 500 mg (05/23/20 0936)  . cefTRIAXone (ROCEPHIN)  IV 2 g (05/24/20 1121)   PRN Meds:.sodium chloride, acetaminophen **OR** acetaminophen, albuterol, HYDROcodone-acetaminophen, labetalol, ondansetron **OR** ondansetron (ZOFRAN) IV, polyethylene glycol, sodium chloride flush, traZODone    Anti-infectives (From admission, onward)   Start     Dose/Rate Route Frequency Ordered Stop   05/23/20 1000  cefTRIAXone (ROCEPHIN) 2 g in sodium chloride 0.9 % 100 mL IVPB  2 g 200 mL/hr over 30 Minutes Intravenous Every 24 hours 05/22/20 1640     05/23/20 1000  azithromycin (ZITHROMAX) 500 mg in sodium chloride 0.9 % 250 mL IVPB        500 mg 250 mL/hr over 60 Minutes Intravenous Every 24 hours 05/22/20 1640     05/22/20 1130  cefTRIAXone (ROCEPHIN) 1 g in sodium chloride 0.9 % 100 mL IVPB        1 g 200 mL/hr over 30 Minutes Intravenous  Once 05/22/20 1129 05/22/20 1230   05/22/20 1130  azithromycin (ZITHROMAX) 500 mg in sodium chloride 0.9 % 250 mL IVPB        500 mg 250 mL/hr over 60 Minutes Intravenous  Once 05/22/20 1129 05/22/20 1346        Objective:   Vitals:   05/23/20 2012 05/23/20 2033 05/24/20 0534 05/24/20 0734  BP: (!) 153/59  (!) 157/78   Pulse: 88 87 94   Resp: _0 Temp: 98.3 F (36.8 C)  99.3 F (37.4 C)   TempSrc: Oral     SpO2: 92% 91% 93% 90%  Weight:       Height:        Wt Readings from Last 3 Encounters:  05/22/20 105.7 kg  05/18/20 103.4 kg  05/14/20 103.7 kg    No intake or output data in the 24 hours ending 05/24/20 1159 Physical Exam  Gen:- Awake Alert,  In no apparent distress , morbidly obese HEENT:- [place].AT, No sclera icterus Nose- [place] 6L/min Neck-Supple Neck,No JVD,.  Lungs-diminished in bases, scattered rhonchi on the Rt CV- S1, S2 normal, regular  Abd-  +ve B.Sounds, Abd Soft, increased truncal adiposity Extremity/Skin:-Bilateral lower extremity edema right more than left, pedal pulses present Psych-affect is appropriate, oriented x3 Neuro-generalized weakness ,no new focal deficits, no tremors   Data Review:   Micro Results Recent Results (from the past 240 hour(s))  SARS CORONAVIRUS 2 (TAT 6-24 HRS) Nasopharyngeal Nasopharyngeal Swab     Status: None   Collection Time: 05/17/20  9:53 AM   Specimen: Nasopharyngeal Swab  Result Value Ref Range Status   SARS Coronavirus 2 NEGATIVE NEGATIVE Final    Comment: (NOTE) SARS-CoV-2 target nucleic acids are NOT DETECTED.  The SARS-CoV-2 RNA is generally detectable in upper and lower respiratory specimens during the acute phase of infection. Negative results do not preclude SARS-CoV-2 infection, do not rule out co-infections with other pathogens, and should not be used as the sole basis for treatment or other patient management decisions. Negative results must be combined with clinical observations, patient history, and epidemiological information. The expected result is Negative.  Fact Sheet for Patients: SugarRoll.be  Fact Sheet for Healthcare Providers: https://www.woods-mathews.com/  This test is not yet approved or cleared by the [place] FDA and  has been authorized for detection and/or diagnosis of SARS-CoV-2 by FDA under an Emergency Use Authorization (EUA). This EUA will remain  in effect (meaning this test  can be used) for the duration of the COVID-19 declaration under Se ction 564(b)(1) of the Act, 21 U.S.C. section 360bbb-3(b)(1), unless the authorization is terminated or revoked sooner.  Performed at [facility], [place] [address], [place],  [address]   Culture, blood (Routine X 2) w Reflex to ID Panel     Status: None (Preliminary result)   Collection Time: 05/22/20 11:17 AM   Specimen: Left Antecubital; Blood  Result Value Ref Range Status   Specimen Description LEFT ANTECUBITAL  Final   Special Requests   Final    BOTTLES DRAWN AEROBIC AND ANAEROBIC Blood Culture adequate volume   Culture   Final    NO GROWTH 2 DAYS Performed at [facility], [address], [place], [place] [address]    Report Status PENDING  Incomplete  SARS Coronavirus 2 by RT PCR (hospital order, performed in [facility] hospital lab) Nasopharyngeal Nasopharyngeal Swab     Status: None   Collection Time: 05/22/20 12:01 PM   Specimen: Nasopharyngeal Swab  Result Value Ref Range Status   SARS Coronavirus 2 NEGATIVE NEGATIVE Final    Comment: (NOTE) SARS-CoV-2 target nucleic acids are NOT DETECTED.  The SARS-CoV-2 RNA is generally detectable in upper and lower respiratory specimens during the acute phase of infection. The lowest concentration of SARS-CoV-2 viral copies this assay can detect is 250 copies / mL. A negative result does not preclude SARS-CoV-2 infection and should not be used as the sole basis for treatment or other patient management decisions.  A negative result may occur with improper specimen collection / handling, submission of specimen other than nasopharyngeal swab, presence of viral mutation(s) within the areas targeted by this assay, and inadequate number of viral copies (<250 copies / mL). A negative result must be combined with clinical observations, patient history, and epidemiological information.  Fact Sheet for Patients:    StrictlyIdeas.no  Fact Sheet for Healthcare Providers: BankingDealers.co.za  This test is not yet approved or  cleared by the [place] FDA and has been authorized for detection and/or diagnosis of SARS-CoV-2 by FDA under an Emergency Use Authorization (EUA).  This EUA will remain in effect (meaning this test can be used) for the duration of the COVID-19 declaration under Section 564(b)(1) of the Act, 21 U.S.C. section 360bbb-3(b)(1), unless the authorization is terminated or revoked sooner.  Performed at [facility], [address], [place], [place] [address]   Culture, blood (Routine X 2) w Reflex to ID Panel     Status: None (Preliminary result)   Collection Time: 05/22/20  6:40 PM   Specimen: BLOOD  Result Value Ref Range Status   Specimen Description BLOOD BLOOD LEFT ARM  Final   Special Requests   Final    BOTTLES DRAWN AEROBIC AND ANAEROBIC Blood Culture results may not be optimal due to an inadequate volume of blood received in culture bottles   Culture   Final    NO GROWTH 2 DAYS Performed at [facility], [address], [place], [place] [address]    Report Status PENDING  Incomplete    Radiology Reports DG Chest 2 View  Result Date: 05/24/2020 CLINICAL DATA:  Shortness of breath. EXAM: CHEST - 2 VIEW COMPARISON:  May 22, 2020. FINDINGS: Stable cardiomegaly. No pneumothorax or pleural effusion is noted. Left-sided pacemaker is unchanged in position. Left lung is clear. Increased right upper lobe airspace opacity is noted concerning for worsening pneumonia. Bony thorax is unremarkable. IMPRESSION: Worsening right upper lobe pneumonia. Electronically Signed   By: Marijo Conception M.D.   On: 05/24/2020 10:00   US Venous Img Lower Bilateral (DVT)  Result Date: 05/23/2020 CLINICAL DATA:  History of pulmonary embolism. Evaluate for lower extremity DVT. EXAM: BILATERAL LOWER EXTREMITY VENOUS DOPPLER ULTRASOUND TECHNIQUE:  Gray-scale sonography with graded compression, as well as color Doppler and duplex ultrasound were performed to evaluate the lower extremity deep venous systems from the level of the common femoral vein and including the common femoral, femoral, profunda femoral, popliteal and calf  veins including the posterior tibial, peroneal and gastrocnemius veins when visible. The superficial great saphenous vein was also interrogated. Spectral Doppler was utilized to evaluate flow at rest and with distal augmentation maneuvers in the common femoral, femoral and popliteal veins. COMPARISON:  12/22/2019 FINDINGS: RIGHT LOWER EXTREMITY Common Femoral Vein: No evidence of thrombus. Normal compressibility, respiratory phasicity and response to augmentation. Saphenofemoral Junction: No evidence of thrombus. Normal compressibility and flow on color Doppler imaging. Profunda Femoral Vein: No evidence of thrombus. Normal compressibility and flow on color Doppler imaging. Femoral Vein: No evidence of thrombus. Normal compressibility, respiratory phasicity and response to augmentation. Popliteal Vein: No evidence of thrombus. Normal compressibility, respiratory phasicity and response to augmentation. Calf Veins: Visualized right deep calf veins are patent without thrombus. Limited evaluation. Superficial Great Saphenous Vein: No evidence of thrombus. Normal compressibility. Other Findings:  None. LEFT LOWER EXTREMITY Common Femoral Vein: No evidence of thrombus. Normal compressibility, respiratory phasicity and response to augmentation. Saphenofemoral Junction: No evidence of thrombus. Normal compressibility and flow on color Doppler imaging. Profunda Femoral Vein: No evidence of thrombus. Normal compressibility and flow on color Doppler imaging. Femoral Vein: No evidence of thrombus. Normal compressibility, respiratory phasicity and response to augmentation. Popliteal Vein: No evidence of thrombus. Normal compressibility, respiratory  phasicity and response to augmentation. Calf Veins: Visualized left deep calf veins are patent without thrombus. Limited evaluation. Superficial Great Saphenous Vein: No evidence of thrombus. Normal compressibility. Other Findings:  None. IMPRESSION: No evidence of deep venous thrombosis in either lower extremity. Electronically Signed   By: Markus Daft M.D.   On: 05/23/2020 12:21   DG Chest Portable 1 View  Result Date: 05/22/2020 CLINICAL DATA:  Shortness of breath, hypoxemia, end-stage renal disease on dialysis, LEFT-sided crackles, RIGHT-side week EXAM: PORTABLE CHEST 1 VIEW COMPARISON:  Portable exam 1042 hours compared to 12/21/2019 FINDINGS: LEFT subclavian sequential pacemaker with leads projecting over RIGHT atrium, RIGHT ventricle, and coronary sinus. Enlargement of cardiac silhouette with vascular congestion. Minimal LEFT base atelectasis. RIGHT perihilar opacities extending into RIGHT upper lobe favor pneumonia although requiring follow-up until resolution to exclude underlying nodule. Question mild enlargement of RIGHT hilum versus perihilar infiltrate. Remaining lungs clear. No pleural effusion or pneumothorax. Bones demineralized. IMPRESSION: Enlargement of cardiac silhouette post pacemaker. Questionable RIGHT hilar enlargement/adenopathy versus perihilar infiltrate. RIGHT upper lobe pneumonia; follow-up exams until resolution recommended to exclude underlying pulmonary mass/nodule and hilar adenopathy. Electronically Signed   By: Lavonia Dana M.D.   On: 05/22/2020 11:16   VAS [place] UPPER EXTREMITY ARTERIAL DUPLEX  Result Date: 04/29/2020 UPPER EXTREMITY DUPLEX STUDY Indications: Patient complains of HD access pre op.  Performing Technologist: Ralene Cork RVT  Examination Guidelines: A complete evaluation includes B-mode imaging, spectral Doppler, color Doppler, and power Doppler as needed of all accessible portions of each vessel. Bilateral testing is considered an integral part of a complete  examination. Limited examinations for reoccurring indications may be performed as noted.  Right Pre-Dialysis Findings: +-----------------------+----------+--------------------+---------+--------+ Location               PSV (cm/s)Intralum. Diam. (cm)Waveform Comments +-----------------------+----------+--------------------+---------+--------+ Brachial Antecub. fossa98        0.50                triphasic         +-----------------------+----------+--------------------+---------+--------+ Radial Art at Wrist    76        0.19                triphasic         +-----------------------+----------+--------------------+---------+--------+  Ulnar Art at Wrist     66        0.16                triphasic         +-----------------------+----------+--------------------+---------+--------+ Left Pre-Dialysis Findings: +-----------------------+----------+--------------------+---------+--------+ Location               PSV (cm/s)Intralum. Diam. (cm)Waveform Comments +-----------------------+----------+--------------------+---------+--------+ Brachial Antecub. fossa81        0.52                triphasic         +-----------------------+----------+--------------------+---------+--------+ Radial Art at Wrist    58        0.21                triphasic         +-----------------------+----------+--------------------+---------+--------+ Ulnar Art at Wrist     61        0.15                triphasic         +-----------------------+----------+--------------------+---------+--------+  Summary:  Right: Patent brachial, radial, and ulnar arteries with calcified        walls in the radial and ulnar arteries. Left: Patent brachial, radial, and ulnar arteries with calcified       walls in the radial and ulnar arteries. *See table(s) above for measurements and observations. Electronically signed by Ruta Hinds MD on 04/29/2020 at 11:57:00 AM.    Final    ECHOCARDIOGRAM COMPLETE  Result Date:  05/23/2020    ECHOCARDIOGRAM REPORT   Patient Name:   CHERITY BLICKENSTAFF Date of Exam: 05/23/2020 Medical Rec #:  [id number]          Height:       63.0 in Accession #:    [id number]         Weight:       233.0 lb Date of Birth:  Oct 05, 1940         BSA:          2.063 m Patient Age:    14 years           BP:           148/63 mmHg Patient Gender: F                  HR:           83 bpm. Exam Location:  [facility] Procedure: 2D Echo, Cardiac Doppler and Color Doppler Indications:    Dyspnea  History:        Patient has prior history of Echocardiogram examinations, most                 recent 12/23/2019. CHF, Pacemaker, Mitral Valve Disease,                 Signs/Symptoms:Shortness of Breath; Risk Factors:Diabetes and                 Hypertension. Sepsis, pneumonia.  Sonographer:    Dustin Flock RDCS Referring Phys: [place]  1. Left ventricular ejection fraction, by estimation, is 60 to 65%. The left ventricle has normal function. The left ventricle has no regional wall motion abnormalities. There is moderate left ventricular hypertrophy. Left ventricular diastolic parameters are consistent with Grade I diastolic dysfunction (impaired relaxation).  2. Right ventricular systolic function is normal. The right ventricular size is normal. There is severely elevated pulmonary artery systolic pressure. The  estimated right ventricular systolic pressure is 54.2 mmHg. Device wire present.  3. Left atrial size was mildly dilated.  4. The mitral valve is abnormal, mildly calcified and with moderate annular calcification. Mild mitral valve regurgitation.  5. The aortic valve is tricuspid. Aortic valve regurgitation is not visualized.  6. The inferior vena cava is dilated in size with <50% respiratory variability, suggesting right atrial pressure of 15 mmHg. FINDINGS  Left Ventricle: Left ventricular ejection fraction, by estimation, is 60 to 65%. The left ventricle has normal function. The left  ventricle has no regional wall motion abnormalities. The left ventricular internal cavity size was normal in size. There is  moderate left ventricular hypertrophy. Left ventricular diastolic parameters are consistent with Grade I diastolic dysfunction (impaired relaxation). Right Ventricle: The right ventricular size is normal. No increase in right ventricular wall thickness. Right ventricular systolic function is normal. There is severely elevated pulmonary artery systolic pressure. The tricuspid regurgitant velocity is 3.49 m/s, and with an assumed right atrial pressure of 15 mmHg, the estimated right ventricular systolic pressure is 70.6 mmHg. Left Atrium: Left atrial size was mildly dilated. Right Atrium: Right atrial size was normal in size. Pericardium: There is no evidence of pericardial effusion. Mitral Valve: The mitral valve is abnormal. There is mild calcification of the mitral valve leaflet(s). Moderate mitral annular calcification. Mild mitral valve regurgitation. Tricuspid Valve: The tricuspid valve is grossly normal. Tricuspid valve regurgitation is mild. Aortic Valve: The aortic valve is tricuspid. Aortic valve regurgitation is not visualized. Mild aortic valve annular calcification. Pulmonic Valve: The pulmonic valve was grossly normal. Pulmonic valve regurgitation is mild. Aorta: The aortic root is normal in size and structure. Venous: The inferior vena cava is dilated in size with less than 50% respiratory variability, suggesting right atrial pressure of 15 mmHg. IAS/Shunts: No atrial level shunt detected by color flow Doppler. Additional Comments: A pacer wire is visualized.  LEFT VENTRICLE PLAX 2D LVIDd:         4.24 cm  Diastology LVIDs:         3.01 cm  LV e' lateral:   3.59 cm/s LV PW:         1.50 cm  LV E/e' lateral: 33.4 LV IVS:        1.52 cm  LV e' medial:    5.44 cm/s LVOT diam:     2.00 cm  LV E/e' medial:  22.1 LV SV:         93 LV SV Index:   45 LVOT Area:     3.14 cm  RIGHT  VENTRICLE RV Basal diam:  3.78 cm RV S prime:     9.90 cm/s TAPSE (M-mode): 3.2 cm LEFT ATRIUM             Index       RIGHT ATRIUM           Index LA diam:        4.30 cm 2.08 cm/m  RA Area:     21.90 cm LA Vol (A2C):   52.2 ml 25.30 ml/m RA Volume:   67.60 ml  32.76 ml/m LA Vol (A4C):   74.4 ml 36.06 ml/m LA Biplane Vol: 69.1 ml 33.49 ml/m  AORTIC VALVE LVOT Vmax:   130.00 cm/s LVOT Vmean:  96.800 cm/s LVOT VTI:    0.295 m  AORTA Ao Root diam: 3.00 cm MITRAL VALVE                TRICUSPID  VALVE MV Area (PHT): 3.28 cm     TR Peak grad:   48.7 mmHg MV Decel Time: 231 msec     TR Vmax:        349.00 cm/s MV E velocity: 120.00 cm/s MV A velocity: 125.00 cm/s  SHUNTS MV E/A ratio:  0.96         Systemic VTI:  0.30 m                             Systemic Diam: 2.00 cm Rozann Lesches MD Electronically signed by Rozann Lesches MD Signature Date/Time: 05/23/2020/11:07:51 AM    Final    VAS [place] UPPER EXTREMITY VEIN MAPPING  Result Date: 04/29/2020 UPPER EXTREMITY VEIN MAPPING  Indications: Pre-access. Performing Technologist: Ralene Cork RVT  Examination Guidelines: A complete evaluation includes B-mode imaging, spectral Doppler, color Doppler, and power Doppler as needed of all accessible portions of each vessel. Bilateral testing is considered an integral part of a complete examination. Limited examinations for reoccurring indications may be performed as noted. +-----------------+-------------+----------+--------+ Right Cephalic   Diameter (cm)Depth (cm)Findings +-----------------+-------------+----------+--------+ Shoulder             0.55                        +-----------------+-------------+----------+--------+ Prox upper arm       0.53                        +-----------------+-------------+----------+--------+ Mid upper arm        0.48                        +-----------------+-------------+----------+--------+ Dist upper arm       0.47                         +-----------------+-------------+----------+--------+ Antecubital fossa    0.66                        +-----------------+-------------+----------+--------+ Prox forearm         0.34                        +-----------------+-------------+----------+--------+ Mid forearm          0.30                        +-----------------+-------------+----------+--------+ Dist forearm         0.24               lateral  +-----------------+-------------+----------+--------+ +-----------------+-------------+----------+--------+ Right Basilic    Diameter (cm)Depth (cm)Findings +-----------------+-------------+----------+--------+ Mid upper arm        0.43                        +-----------------+-------------+----------+--------+ Dist upper arm       0.52                        +-----------------+-------------+----------+--------+ Antecubital fossa    0.28                        +-----------------+-------------+----------+--------+ +-----------------+-------------+----------+--------------+ Left Cephalic    Diameter (cm)Depth (cm)   Findings    +-----------------+-------------+----------+--------------+ Shoulder  0.28                              +-----------------+-------------+----------+--------------+ Prox upper arm       0.18                              +-----------------+-------------+----------+--------------+ Mid upper arm        0.18                              +-----------------+-------------+----------+--------------+ Dist upper arm       0.22                              +-----------------+-------------+----------+--------------+ Antecubital fossa    0.25                              +-----------------+-------------+----------+--------------+ Prox forearm         0.16                              +-----------------+-------------+----------+--------------+ Mid forearm          0.12                               +-----------------+-------------+----------+--------------+ Dist forearm                            not visualized +-----------------+-------------+----------+--------------+ +-----------------+-------------+----------+--------+ Left Basilic     Diameter (cm)Depth (cm)Findings +-----------------+-------------+----------+--------+ Mid upper arm        0.38                        +-----------------+-------------+----------+--------+ Dist upper arm       0.48                        +-----------------+-------------+----------+--------+ Antecubital fossa    0.25                        +-----------------+-------------+----------+--------+ Summary: Right: Patent cephalic and basilic veins. Left: Patent cephalic and basilic veins. *See table(s) above for measurements and observations.  Diagnosing physician: Ruta Hinds MD Electronically signed by Ruta Hinds MD on 04/29/2020 at 11:57:08 AM.    Final      CBC Recent Labs  Lab 05/18/20 0827 05/22/20 1115 05/22/20 2129 05/23/20 0735 05/24/20 0622  WBC  --  11.9*  --  10.6* 10.5  HGB 10.5* 9.1* 8.0* 8.6* 8.2*  HCT 31.0* 30.1* 26.8* 29.3* 27.2*  PLT  --  247  --  264 267  MCV  --  96.5  --  97.3 98.2  MCH  --  29.2  --  28.6 29.6  MCHC  --  30.2  --  29.4* 30.1  RDW  --  14.9  --  14.8 14.8  LYMPHSABS  --  1.7  --   --   --   MONOABS  --  0.9  --   --   --   EOSABS  --  0.0  --   --   --  BASOSABS  --  0.1  --   --   --     Chemistries  Recent Labs  Lab 05/18/20 0827 05/22/20 1115 05/23/20 0735 05/24/20 0622  NA 144 138 140 141  K 4.2 4.2 4.2 4.4  CL 114* 109 111 111  CO2  --  21* 21* 22  GLUCOSE 165* 250* 149* 134*  BUN 54* 73* 75* 68*  CREATININE 3.30* 3.56* 3.59* 3.57*  CALCIUM  --  8.5* 8.5* 8.3*  AST  --  46*  --   --   ALT  --  34  --   --   ALKPHOS  --  67  --   --   BILITOT  --  0.6  --   --     ------------------------------------------------------------------------------------------------------------------ No results for input(s): CHOL, HDL, LDLCALC, TRIG, CHOLHDL, LDLDIRECT in the last 72 hours.  Lab Results  Component Value Date   HGBA1C 7.4 (H) 05/22/2020   ------------------------------------------------------------------------------------------------------------------ No results for input(s): TSH, T4TOTAL, T3FREE, THYROIDAB in the last 72 hours.  Invalid input(s): FREET3 ------------------------------------------------------------------------------------------------------------------ No results for input(s): VITAMINB12, FOLATE, FERRITIN, TIBC, IRON, RETICCTPCT in the last 72 hours.  Coagulation profile Recent Labs  Lab 05/22/20 1115  INR 1.9*    No results for input(s): DDIMER in the last 72 hours.  Cardiac Enzymes No results for input(s): CKMB, TROPONINI, MYOGLOBIN in the last 168 hours.  Invalid input(s): CK ------------------------------------------------------------------------------------------------------------------    Component Value Date/Time   BNP 1,462.0 (H) 05/22/2020 1115    Roxan Hockey M.D on 05/24/2020 at 11:59 AM  Go to [email or web address] - for contact info  [facility] - Office  [phone]

## 2020-05-24 NOTE — Consult Note (Signed)
Referring Provider: Dr. Roxan Hockey Primary Care Physician:  Manon Hilding, MD Primary Gastroenterologist:  Dr. Gala Romney   Date of Admission: 05/22/20 Date of Consultation: 05/24/20  Reason for Consultation: GI bleed   HPI:  Cassandra Parker is a 79 y.o. year old female presenting with acute respiratory failure, sepsis secondary to pneumonia, history of PE diagnosed in March 2021 on Eliquis, history of complete heart block s/p pacemaker placement, severe pulmonary hypertension, found to have dark, heme positive stools after restarting Eliquis, which had been on hold for AV fistula creation.   Hgb historically in 10/11 range, with Hgb 9.1 on presentation. Slowly drifting now 8.2. Last dose of Eliquis: Saturday morning. She had been off of Eliquis for AV fistula, resuming on 8/25. Onset of black stools 2 days later. She Last black stool yesterday evening. None today thus far. Had lower abdominal discomfort and leg discomfort with walking prior to admission. No abdominal pain now. No nausea or vomiting. Two days prior to admission had decreased appetite. No dysphagia. No hematochezia. No GERD. No unexplained weight loss. No NSAIDs. Only tylenol at night when laying down for leg pain. EGD at [facility] in remote past, possibly 10 years or more ago. Possible colonoscopy greater than 10 years ago. No known polyps. No family history of colorectal cancer or polyps.   Noting dyspnea on exertion. No oxygen at home. On 10 liters nasal cannula O2 currently this morning. CXR ordered. Not on dialysis. Followed by Nephrology.   Duplex ultrasound yesterday negative for DVT bilaterally.   Past Medical History:  Diagnosis Date  . Acute renal insufficiency 10/15/2013  . Arthritis   . Cancer [facility]) 2008   right breast  . Cancer of breast- s/p chemo and Rt mastectomy 2008 10/13/2013  . Cardiomyopathy ([place])    dating back to 2010  . CHB (complete heart block) ([place]) 10/13/2013  . Chronic combined systolic and  diastolic congestive heart failure ([place])   . Diabetes mellitus    with neuropathy  . Diabetes mellitus type 2, controlled, without complications ([place]) 2/42/6834  . Dyslipidemia 10/13/2013  . History of blood transfusion   . Hypercholesterolemia   . Hypertension   . Mitral stenosis- moderate with trivial MR  10/15/2013  . Pacemaker 10/23/2014  . Pneumonia   . Presence of permanent cardiac pacemaker    [employer]  . Primary osteoarthritis of left knee 11/04/2015  . Syncope 10/13/2013  . Tingling    feet bilat     Past Surgical History:  Procedure Laterality Date  . APPENDECTOMY     mmh  . AV FISTULA PLACEMENT Right 05/18/2020   Procedure: RIGHT ARM BRACHIOCEPHALIC ARTERIOVENOUS (AV) FISTULA CREATION;  Surgeon: Elam Dutch, MD;  Location: [place];  Service: Vascular;  Laterality: Right;  . BI-VENTRICULAR PACEMAKER INSERTION N/A 10/15/2013   Procedure: BI-VENTRICULAR PACEMAKER INSERTION (CRT-P);  Surgeon: Evans Lance, MD;  Location: [facility] CATH LAB;  Service: Cardiovascular;  Laterality: N/A;  . Cardiomyopathy    . CATARACT EXTRACTION W/PHACO  06/29/2011   Procedure: CATARACT EXTRACTION PHACO AND INTRAOCULAR LENS PLACEMENT (IOC);  Surgeon: Tonny Branch;  Location: [facility];  Service: Ophthalmology;  Laterality: Right;  CDE: 14.41  . CATARACT EXTRACTION W/PHACO  09/04/2011   Procedure: CATARACT EXTRACTION PHACO AND INTRAOCULAR LENS PLACEMENT (IOC);  Surgeon: Tonny Branch;  Location: [facility];  Service: Ophthalmology;  Laterality: Left;  CDE: 15.21  . CHOLECYSTECTOMY     mmh  . JOINT REPLACEMENT Left    knee  .  KNEE ARTHROSCOPY     right-mmh  . MASTECTOMY  2008    mmh-right  . TEMPORARY PACEMAKER INSERTION N/A 10/13/2013   Procedure: TEMPORARY PACEMAKER INSERTION;  Surgeon: Jolaine Artist, MD;  Location: [facility] CATH LAB;  Service: Cardiovascular;  Laterality: N/A;  . TOTAL KNEE ARTHROPLASTY Left 11/04/2015   Procedure: LEFT TOTAL KNEE ARTHROPLASTY;  Surgeon: Rod Can, MD;  Location: [facility];   Service: Orthopedics;  Laterality: Left;    Prior to Admission medications   Medication Sig Start Date End Date Taking? Authorizing Provider  acetaminophen (TYLENOL) 325 MG tablet Take 2 tablets (650 mg total) by mouth every 6 (six) hours as needed for mild pain or headache (or Fever >/= 101). 12/23/19  Yes Emokpae, Courage, MD  amLODipine (NORVASC) 5 MG tablet Take 10 mg by mouth daily.   Yes [provider]  apixaban (ELIQUIS) 5 MG TABS tablet Take 5 mg by mouth 2 (two) times daily.    Yes [provider]  carvedilol (COREG) 3.125 MG tablet Take 1 tablet (3.125 mg total) by mouth 2 (two) times daily. 12/23/19  Yes Emokpae, Courage, MD  Cholecalciferol (VITAMIN D) 2000 UNITS tablet Take 2,000 Units by mouth daily.    Yes [provider]  furosemide (LASIX) 40 MG tablet Take 40 mg by mouth daily.    Yes [provider]  glipiZIDE (GLUCOTROL XL) 5 MG 24 hr tablet Take 5 mg by mouth See admin instructions. Take 10 mg in the morning and 5 mg at night   Yes [provider]  hydrALAZINE (APRESOLINE) 50 MG tablet Take 50 mg by mouth 2 (two) times daily.   Yes [provider]  HYDROcodone-acetaminophen (NORCO) 5-325 MG tablet Take 1 tablet by mouth every 6 (six) hours as needed for moderate pain. 05/18/20  Yes Dagoberto Ligas, PA-C  potassium chloride (KLOR-CON) 10 MEQ tablet Take 10 mEq by mouth daily.    Yes [provider]  pravastatin (PRAVACHOL) 10 MG tablet Take 10 mg by mouth at bedtime.    Yes [provider]  vitamin B-12 (CYANOCOBALAMIN) 1000 MCG tablet Take 1,000 mcg by mouth daily.   Yes [provider]    Current Facility-Administered Medications  Medication Dose Route Frequency Provider Last Rate Last Admin  . 0.9 %  sodium chloride infusion  250 mL Intravenous PRN Emokpae, Courage, MD      . acetaminophen (TYLENOL) tablet 650 mg  650 mg Oral Q6H PRN Emokpae, Courage, MD       Or  . acetaminophen (TYLENOL)  suppository 650 mg  650 mg Rectal Q6H PRN Emokpae, Courage, MD      . albuterol (PROVENTIL) (2.5 MG/3ML) 0.083% nebulizer solution 2.5 mg  2.5 mg Nebulization Q2H PRN Emokpae, Courage, MD      . amLODipine (NORVASC) tablet 10 mg  10 mg Oral Daily Emokpae, Courage, MD   10 mg at 05/23/20 0929  . azithromycin (ZITHROMAX) 500 mg in sodium chloride 0.9 % 250 mL IVPB  500 mg Intravenous Q24H Emokpae, Courage, MD 250 mL/hr at 05/23/20 0936 500 mg at 05/23/20 0936  . carvedilol (COREG) tablet 3.125 mg  3.125 mg Oral BID Roxan Hockey, MD   3.125 mg at 05/23/20 2106  . cefTRIAXone (ROCEPHIN) 2 g in sodium chloride 0.9 % 100 mL IVPB  2 g Intravenous Q24H Emokpae, Courage, MD 200 mL/hr at 05/23/20 1100 2 g at 05/23/20 1100  . cholecalciferol (VITAMIN D3) tablet 2,000 Units  2,000 Units Oral Daily Emokpae, Courage,  MD   2,000 Units at 05/23/20 0929  . furosemide (LASIX) tablet 40 mg  40 mg Oral Daily Emokpae, Courage, MD   40 mg at 05/23/20 0930  . guaiFENesin (MUCINEX) 12 hr tablet 600 mg  600 mg Oral BID Roxan Hockey, MD   600 mg at 05/23/20 2105  . hydrALAZINE (APRESOLINE) tablet 50 mg  50 mg Oral BID Roxan Hockey, MD   50 mg at 05/23/20 2106  . HYDROcodone-acetaminophen (NORCO/VICODIN) 5-325 MG per tablet 1 tablet  1 tablet Oral Q6H PRN Roxan Hockey, MD   1 tablet at 05/23/20 2108  . insulin aspart (novoLOG) injection 0-5 Units  0-5 Units Subcutaneous QHS Roxan Hockey, MD   2 Units at 05/23/20 2300  . insulin aspart (novoLOG) injection 0-6 Units  0-6 Units Subcutaneous TID WC Roxan Hockey, MD   2 Units at 05/23/20 1144  . ipratropium-albuterol (DUONEB) 0.5-2.5 (3) MG/3ML nebulizer solution 3 mL  3 mL Nebulization TID Roxan Hockey, MD   3 mL at 05/24/20 0734  . labetalol (NORMODYNE) injection 10 mg  10 mg Intravenous Q4H PRN Emokpae, Courage, MD      . multivitamin with minerals tablet 1 tablet  1 tablet Oral Daily Emokpae, Courage, MD   1 tablet at 05/23/20 0930  . ondansetron  (ZOFRAN) tablet 4 mg  4 mg Oral Q6H PRN Emokpae, Courage, MD       Or  . ondansetron (ZOFRAN) injection 4 mg  4 mg Intravenous Q6H PRN Emokpae, Courage, MD      . pantoprazole (PROTONIX) injection 40 mg  40 mg Intravenous Q12H Emokpae, Courage, MD   40 mg at 05/23/20 2108  . polyethylene glycol (MIRALAX / GLYCOLAX) packet 17 g  17 g Oral Daily PRN Emokpae, Courage, MD      . potassium chloride (KLOR-CON) CR tablet 10 mEq  10 mEq Oral Daily Emokpae, Courage, MD   10 mEq at 05/23/20 0930  . pravastatin (PRAVACHOL) tablet 10 mg  10 mg Oral QHS Emokpae, Courage, MD   10 mg at 05/23/20 2106  . sodium chloride flush (NS) 0.9 % injection 3 mL  3 mL Intravenous Q12H Emokpae, Courage, MD   3 mL at 05/23/20 0931  . sodium chloride flush (NS) 0.9 % injection 3 mL  3 mL Intravenous PRN Emokpae, Courage, MD      . traZODone (DESYREL) tablet 50 mg  50 mg Oral QHS PRN Emokpae, Courage, MD      . vitamin B-12 (CYANOCOBALAMIN) tablet 1,000 mcg  1,000 mcg Oral Daily Emokpae, Courage, MD   1,000 mcg at 05/23/20 0931    Allergies as of 05/22/2020  . (No Known Allergies)    Family History  Problem Relation Age of Onset  . Hypertension Father   . Diabetes Father   . Anesthesia problems Neg Hx   . Hypotension Neg Hx   . Malignant hyperthermia Neg Hx   . Pseudochol deficiency Neg Hx   . Colon cancer Neg Hx   . Colon polyps Neg Hx     Social History   Socioeconomic History  . Marital status: Widowed    Spouse name: Not on file  . Number of children: Not on file  . Years of education: Not on file  . Highest education level: Not on file  Occupational History  . Not on file  Tobacco Use  . Smoking status: Never Smoker  . Smokeless tobacco: Never Used  Vaping Use  . Vaping Use: Never used  Substance and  Sexual Activity  . Alcohol use: No    Alcohol/week: 0.0 standard drinks  . Drug use: No  . Sexual activity: Yes    Birth control/protection: Post-menopausal  Other Topics Concern  . Not on file   Social History Narrative  . Not on file   Social Determinants of Health   Financial Resource Strain:   . Difficulty of Paying Living Expenses: Not on file  Food Insecurity:   . Worried About Charity fundraiser in the Last Year: Not on file  . Ran Out of Food in the Last Year: Not on file  Transportation Needs:   . Lack of Transportation (Medical): Not on file  . Lack of Transportation (Non-Medical): Not on file  Physical Activity:   . Days of Exercise per Week: Not on file  . Minutes of Exercise per Session: Not on file  Stress:   . Feeling of Stress : Not on file  Social Connections:   . Frequency of Communication with Friends and Family: Not on file  . Frequency of Social Gatherings with Friends and Family: Not on file  . Attends Religious Services: Not on file  . Active Member of Clubs or Organizations: Not on file  . Attends Archivist Meetings: Not on file  . Marital Status: Not on file  Intimate Partner Violence:   . Fear of Current or Ex-Partner: Not on file  . Emotionally Abused: Not on file  . Physically Abused: Not on file  . Sexually Abused: Not on file    Review of Systems: Gen: Denies fever, chills, loss of appetite, change in weight or weight loss CV: Denies chest pain, heart palpitations, syncope, edema  Resp: +DOE GI: see HPI GU : urinary incontinence MS: see HPI Derm: Denies rash, itching, dry skin Psych: Denies depression, anxiety,confusion, or memory loss Heme: see HPI  Physical Exam: Vital signs in last 24 hours: Temp:  [98.3 F (36.8 C)-99.3 F (37.4 C)] 99.3 F (37.4 C) (08/30 0534) Pulse Rate:  [80-94] 94 (08/30 0534) Resp:  [19-20] 20 (08/30 0534) BP: (150-157)/(59-78) 157/78 (08/30 0534) SpO2:  [88 %-95 %] 90 % (08/30 0734) Last BM Date: 05/23/20 General:   Alert, acutely ill-appearing, on 10 liters nasal cannula.  Head:  Normocephalic and atraumatic. Eyes:  Sclera clear, no icterus.   Conjunctiva pink. Ears:  Normal  auditory acuity. Nose:  No deformity, discharge,  or lesions. Lungs:  Increased respiratory effort with minimal exertion, coarse with expiratory wheeze Heart:  S1 S2 present with murmur Abdomen:  Soft, nontender and nondistended. No masses, hepatosplenomegaly or hernias noted. Normal bowel sounds, without guarding, and without rebound.   Rectal:  Deferred  Msk:  Symmetrical without gross deformities. Normal posture. Extremities:  With 2+ pitting lower extremity edema, pedal and ankle edema Neurologic:  Alert and  oriented x4 Psych:  Alert and cooperative. Normal mood and affect.  Intake/Output from previous day: 08/29 0701 - 08/30 0700 In: 3 [I.V.:3] Out: -  Intake/Output this shift: No intake/output data recorded.  Lab Results: Recent Labs    05/22/20 1115 05/22/20 1115 05/22/20 2129 05/23/20 0735 05/24/20 0622  WBC 11.9*  --   --  10.6* 10.5  HGB 9.1*   < > 8.0* 8.6* 8.2*  HCT 30.1*   < > 26.8* 29.3* 27.2*  PLT 247  --   --  264 267   < > = values in this interval not displayed.   BMET Recent Labs    05/22/20 1115 05/23/20  6812 05/24/20 0622  NA 138 140 141  K 4.2 4.2 4.4  CL 109 111 111  CO2 21* 21* 22  GLUCOSE 250* 149* 134*  BUN 73* 75* 68*  CREATININE 3.56* 3.59* 3.57*  CALCIUM 8.5* 8.5* 8.3*   LFT Recent Labs    05/22/20 1115  PROT 7.0  ALBUMIN 3.3*  AST 46*  ALT 34  ALKPHOS 67  BILITOT 0.6   PT/INR Recent Labs    05/22/20 1115  LABPROT 21.1*  INR 1.9*    Studies/Results: US Venous Img Lower Bilateral (DVT)  Result Date: 05/23/2020 CLINICAL DATA:  History of pulmonary embolism. Evaluate for lower extremity DVT. EXAM: BILATERAL LOWER EXTREMITY VENOUS DOPPLER ULTRASOUND TECHNIQUE: Gray-scale sonography with graded compression, as well as color Doppler and duplex ultrasound were performed to evaluate the lower extremity deep venous systems from the level of the common femoral vein and including the common femoral, femoral, profunda femoral,  popliteal and calf veins including the posterior tibial, peroneal and gastrocnemius veins when visible. The superficial great saphenous vein was also interrogated. Spectral Doppler was utilized to evaluate flow at rest and with distal augmentation maneuvers in the common femoral, femoral and popliteal veins. COMPARISON:  12/22/2019 FINDINGS: RIGHT LOWER EXTREMITY Common Femoral Vein: No evidence of thrombus. Normal compressibility, respiratory phasicity and response to augmentation. Saphenofemoral Junction: No evidence of thrombus. Normal compressibility and flow on color Doppler imaging. Profunda Femoral Vein: No evidence of thrombus. Normal compressibility and flow on color Doppler imaging. Femoral Vein: No evidence of thrombus. Normal compressibility, respiratory phasicity and response to augmentation. Popliteal Vein: No evidence of thrombus. Normal compressibility, respiratory phasicity and response to augmentation. Calf Veins: Visualized right deep calf veins are patent without thrombus. Limited evaluation. Superficial Great Saphenous Vein: No evidence of thrombus. Normal compressibility. Other Findings:  None. LEFT LOWER EXTREMITY Common Femoral Vein: No evidence of thrombus. Normal compressibility, respiratory phasicity and response to augmentation. Saphenofemoral Junction: No evidence of thrombus. Normal compressibility and flow on color Doppler imaging. Profunda Femoral Vein: No evidence of thrombus. Normal compressibility and flow on color Doppler imaging. Femoral Vein: No evidence of thrombus. Normal compressibility, respiratory phasicity and response to augmentation. Popliteal Vein: No evidence of thrombus. Normal compressibility, respiratory phasicity and response to augmentation. Calf Veins: Visualized left deep calf veins are patent without thrombus. Limited evaluation. Superficial Great Saphenous Vein: No evidence of thrombus. Normal compressibility. Other Findings:  None. IMPRESSION: No evidence of  deep venous thrombosis in either lower extremity. Electronically Signed   By: Markus Daft M.D.   On: 05/23/2020 12:21   DG Chest Portable 1 View  Result Date: 05/22/2020 CLINICAL DATA:  Shortness of breath, hypoxemia, end-stage renal disease on dialysis, LEFT-sided crackles, RIGHT-side week EXAM: PORTABLE CHEST 1 VIEW COMPARISON:  Portable exam 1042 hours compared to 12/21/2019 FINDINGS: LEFT subclavian sequential pacemaker with leads projecting over RIGHT atrium, RIGHT ventricle, and coronary sinus. Enlargement of cardiac silhouette with vascular congestion. Minimal LEFT base atelectasis. RIGHT perihilar opacities extending into RIGHT upper lobe favor pneumonia although requiring follow-up until resolution to exclude underlying nodule. Question mild enlargement of RIGHT hilum versus perihilar infiltrate. Remaining lungs clear. No pleural effusion or pneumothorax. Bones demineralized. IMPRESSION: Enlargement of cardiac silhouette post pacemaker. Questionable RIGHT hilar enlargement/adenopathy versus perihilar infiltrate. RIGHT upper lobe pneumonia; follow-up exams until resolution recommended to exclude underlying pulmonary mass/nodule and hilar adenopathy. Electronically Signed   By: Lavonia Dana M.D.   On: 05/22/2020 11:16   ECHOCARDIOGRAM COMPLETE  Result Date: 05/23/2020  ECHOCARDIOGRAM REPORT   Patient Name:   LINN CLAVIN Date of Exam: 05/23/2020 Medical Rec #:  [id number]          Height:       63.0 in Accession #:    [id number]         Weight:       233.0 lb Date of Birth:  01/22/1941         BSA:          2.063 m Patient Age:    69 years           BP:           148/63 mmHg Patient Gender: F                  HR:           83 bpm. Exam Location:  [facility] Procedure: 2D Echo, Cardiac Doppler and Color Doppler Indications:    Dyspnea  History:        Patient has prior history of Echocardiogram examinations, most                 recent 12/23/2019. CHF, Pacemaker, Mitral Valve Disease,                  Signs/Symptoms:Shortness of Breath; Risk Factors:Diabetes and                 Hypertension. Sepsis, pneumonia.  Sonographer:    Dustin Flock RDCS Referring Phys: [place]  1. Left ventricular ejection fraction, by estimation, is 60 to 65%. The left ventricle has normal function. The left ventricle has no regional wall motion abnormalities. There is moderate left ventricular hypertrophy. Left ventricular diastolic parameters are consistent with Grade I diastolic dysfunction (impaired relaxation).  2. Right ventricular systolic function is normal. The right ventricular size is normal. There is severely elevated pulmonary artery systolic pressure. The estimated right ventricular systolic pressure is 21.9 mmHg. Device wire present.  3. Left atrial size was mildly dilated.  4. The mitral valve is abnormal, mildly calcified and with moderate annular calcification. Mild mitral valve regurgitation.  5. The aortic valve is tricuspid. Aortic valve regurgitation is not visualized.  6. The inferior vena cava is dilated in size with <50% respiratory variability, suggesting right atrial pressure of 15 mmHg. FINDINGS  Left Ventricle: Left ventricular ejection fraction, by estimation, is 60 to 65%. The left ventricle has normal function. The left ventricle has no regional wall motion abnormalities. The left ventricular internal cavity size was normal in size. There is  moderate left ventricular hypertrophy. Left ventricular diastolic parameters are consistent with Grade I diastolic dysfunction (impaired relaxation). Right Ventricle: The right ventricular size is normal. No increase in right ventricular wall thickness. Right ventricular systolic function is normal. There is severely elevated pulmonary artery systolic pressure. The tricuspid regurgitant velocity is 3.49 m/s, and with an assumed right atrial pressure of 15 mmHg, the estimated right ventricular systolic pressure is 75.8 mmHg. Left  Atrium: Left atrial size was mildly dilated. Right Atrium: Right atrial size was normal in size. Pericardium: There is no evidence of pericardial effusion. Mitral Valve: The mitral valve is abnormal. There is mild calcification of the mitral valve leaflet(s). Moderate mitral annular calcification. Mild mitral valve regurgitation. Tricuspid Valve: The tricuspid valve is grossly normal. Tricuspid valve regurgitation is mild. Aortic Valve: The aortic valve is tricuspid. Aortic valve regurgitation is not visualized. Mild aortic valve annular calcification. Pulmonic Valve:  The pulmonic valve was grossly normal. Pulmonic valve regurgitation is mild. Aorta: The aortic root is normal in size and structure. Venous: The inferior vena cava is dilated in size with less than 50% respiratory variability, suggesting right atrial pressure of 15 mmHg. IAS/Shunts: No atrial level shunt detected by color flow Doppler. Additional Comments: A pacer wire is visualized.  LEFT VENTRICLE PLAX 2D LVIDd:         4.24 cm  Diastology LVIDs:         3.01 cm  LV e' lateral:   3.59 cm/s LV PW:         1.50 cm  LV E/e' lateral: 33.4 LV IVS:        1.52 cm  LV e' medial:    5.44 cm/s LVOT diam:     2.00 cm  LV E/e' medial:  22.1 LV SV:         93 LV SV Index:   45 LVOT Area:     3.14 cm  RIGHT VENTRICLE RV Basal diam:  3.78 cm RV S prime:     9.90 cm/s TAPSE (M-mode): 3.2 cm LEFT ATRIUM             Index       RIGHT ATRIUM           Index LA diam:        4.30 cm 2.08 cm/m  RA Area:     21.90 cm LA Vol (A2C):   52.2 ml 25.30 ml/m RA Volume:   67.60 ml  32.76 ml/m LA Vol (A4C):   74.4 ml 36.06 ml/m LA Biplane Vol: 69.1 ml 33.49 ml/m  AORTIC VALVE LVOT Vmax:   130.00 cm/s LVOT Vmean:  96.800 cm/s LVOT VTI:    0.295 m  AORTA Ao Root diam: 3.00 cm MITRAL VALVE                TRICUSPID VALVE MV Area (PHT): 3.28 cm     TR Peak grad:   48.7 mmHg MV Decel Time: 231 msec     TR Vmax:        349.00 cm/s MV E velocity: 120.00 cm/s MV A velocity: 125.00  cm/s  SHUNTS MV E/A ratio:  0.96         Systemic VTI:  0.30 m                             Systemic Diam: 2.00 cm Rozann Lesches MD Electronically signed by Rozann Lesches MD Signature Date/Time: 05/23/2020/11:07:51 AM    Final     Impression:  79 y.o. year old female with multiple comorbidities including but not limited to history of severe pulmonary hypertension, heart failure, complete heart block s/p pacemaker placement, PE in March 2021,  presenting with acute respiratory failure, sepsis secondary to pneumonia,  found to have dark, heme positive stools after restarting Eliquis, which had been on hold for AV fistula creation. Acute blood loss anemia with Hgb historically in the 10/11 range and 9.1 on admission, slowly drifting to 8.2 today. Last episode of melena yesterday.   From a GI standpoint: she has not had overt GI bleeding since yesterday. Eliquis on hold with last dose on Saturday morning. Last EGD possibly 10 or so years ago at [facility] per patient. She has had no dysphagia, significant abdominal pain, weight loss, N/V, and denies NSAIDs. Needs EGD when clinically stable.   Requiring increased oxygen, now  on 10 liters this morning and has dyspnea with minimal exertion. No O2 at home prior to admission. COVID negative this admission. Will need to stabilized from respiratory standpoint prior to diagnostic EGD. CXR ordered again this morning and pending.  With her multiple comorbidities, I will review with anesthesia her candidacy for sedation here at [facility].   Plan: Continue PPI BID Follow H/H, transfuse as needed Continue to hold Eliquis May have clear liquids Will discuss with anesthesia if candidate for EGD here. EGD on hold until improved from respiratory standpoint Further recommendations to follow   Annitta Needs, PhD, ANP-BC [facility]     LOS: 2 days    05/24/2020, 9:44 AM

## 2020-05-24 NOTE — Care Management Important Message (Signed)
Important Message  Patient Details  Name: Cassandra Parker MRN: [id number] Date of Birth: Sep 27, 1940   Medicare Important Message Given:  Yes     Tommy Medal 05/24/2020, 3:53 PM

## 2020-05-24 NOTE — Consult Note (Signed)
Name: Cassandra Parker MRN: [id number] DOB: 1941/03/24    ADMISSION DATE:  05/22/2020 CONSULTATION DATE:  05/24/2020   REFERRING MD :  Courage, triad   CHIEF COMPLAINT:  resp distress, hypoxia  BRIEF PATIENT DESCRIPTION: 79 year old diabetic with advanced CKD underwent right AV fistula placement 8/24 under MAC anesthesia and presented 8/28 with hypoxia and right upper lobe pneumonia with melena  SIGNIFICANT EVENTS  8/24 right AV fistula creation under MAC anesthesia  STUDIES:  Echo 8/29 normal LVEF, moderate LVH, grade 1 diastolic dysfunction, RVSP 64 Echo 11/2019 mildly elevated RVSP 11/2019 intermediate VQ scan 11/2019 venous duplex negative  8/29 venous duplex negative   HISTORY OF PRESENT ILLNESS: 79 year old diabetic, hypertensive, PPM, HFpEF underwent right AV fistula placement 8/24 due to impending dialysis under MAC anesthesia procedure reportedly uneventful She was brought in by EMS 8/28 with shortness of breath, saturation 68% on room air, placed on CPAP initially .  ED evaluation showed right upper lobe pneumonia, leukocytosis and heme positive stools with hemoglobin 9.1 baseline close to 11. Eliquis was stopped, she was treated with Rocephin and azithromycin, Lasix was continued Over last 24 hours oxygen requirements have increased from 6 L to 10 L today. We are consulted for pulmonary hypertension noted on echo  She has received her Covid vaccination, Covid testing was negative, aspiration is suspected. GI was consulted with the plan for EGD when clinically stable  PMH - CKDIV, s/pright mastectomy and  axillary node dissection HTN, DM2, HLD,History of Complete Heart Block s/p Pacemaker placement, HFpEF, chronic Anemia and h/o PE (11/2019) on Eliquis   PAST MEDICAL HISTORY :   has a past medical history of Acute renal insufficiency (10/15/2013), Arthritis, Cancer ([place]) (2008), Cancer of breast- s/p chemo and Rt mastectomy 2008 (10/13/2013), Cardiomyopathy ([place]), CHB  (complete heart block) ([place]) (10/13/2013), Chronic combined systolic and diastolic congestive heart failure ([place]), Diabetes mellitus, Diabetes mellitus type 2, controlled, without complications ([place]) (11/14/2540), Dyslipidemia (10/13/2013), History of blood transfusion, Hypercholesterolemia, Hypertension, Mitral stenosis- moderate with trivial MR  (10/15/2013), Pacemaker (10/23/2014), Pneumonia, Presence of permanent cardiac pacemaker, Primary osteoarthritis of left knee (11/04/2015), Syncope (10/13/2013), and Tingling.  has a past surgical history that includes Mastectomy (2008 ); Knee arthroscopy; Cholecystectomy; Appendectomy; Cataract extraction w/PHACO (06/29/2011); Cataract extraction w/PHACO (09/04/2011); Cardiomyopathy; temporary pacemaker insertion (N/A, 10/13/2013); bi-ventricular pacemaker insertion (N/A, 10/15/2013); Total knee arthroplasty (Left, 11/04/2015); Joint replacement (Left); and AV fistula placement (Right, 05/18/2020). Prior to Admission medications   Medication Sig Start Date End Date Taking? Authorizing Provider  acetaminophen (TYLENOL) 325 MG tablet Take 2 tablets (650 mg total) by mouth every 6 (six) hours as needed for mild pain or headache (or Fever >/= 101). 12/23/19  Yes Emokpae, Courage, MD  amLODipine (NORVASC) 5 MG tablet Take 10 mg by mouth daily.   Yes [provider]  apixaban (ELIQUIS) 5 MG TABS tablet Take 5 mg by mouth 2 (two) times daily.    Yes [provider]  carvedilol (COREG) 3.125 MG tablet Take 1 tablet (3.125 mg total) by mouth 2 (two) times daily. 12/23/19  Yes Emokpae, Courage, MD  Cholecalciferol (VITAMIN D) 2000 UNITS tablet Take 2,000 Units by mouth daily.    Yes [provider]  furosemide (LASIX) 40 MG tablet Take 40 mg by mouth daily.    Yes [provider]  glipiZIDE (GLUCOTROL XL) 5 MG 24 hr tablet Take 5 mg by mouth See admin instructions. Take 10 mg in the morning and 5 mg at night   Yes [provider]  hydrALAZINE  (APRESOLINE) 50 MG tablet Take 50 mg by mouth 2 (two) times daily.   Yes [provider]  HYDROcodone-acetaminophen (NORCO) 5-325 MG tablet Take 1 tablet by mouth every 6 (six) hours as needed for moderate pain. 05/18/20  Yes Dagoberto Ligas, PA-C  potassium chloride (KLOR-CON) 10 MEQ tablet Take 10 mEq by mouth daily.    Yes [provider]  pravastatin (PRAVACHOL) 10 MG tablet Take 10 mg by mouth at bedtime.    Yes [provider]  vitamin B-12 (CYANOCOBALAMIN) 1000 MCG tablet Take 1,000 mcg by mouth daily.   Yes [provider]   No Known Allergies  FAMILY HISTORY:  family history includes Diabetes in her father; Hypertension in her father. SOCIAL HISTORY:  reports that she has never smoked. She has never used smokeless tobacco. She reports that she does not drink alcohol and does not use drugs.  REVIEW OF SYSTEMS:   Positive dark stools, last episode 8/29 Shortness of breath , she has stayed in bed the last 2 days No cough, hemoptysis Pedal edema, no orthopnea paroxysmal nocturnal dyspnea  SUBJECTIVE:   VITAL SIGNS: Temp:  [98.3 F (36.8 C)-99.3 F (37.4 C)] 99.3 F (37.4 C) (08/30 0534) Pulse Rate:  [80-94] 94 (08/30 0534) Resp:  [19-20] 20 (08/30 0534) BP: (150-157)/(59-78) 157/78 (08/30 0534) SpO2:  [90 %-95 %] 92 % (08/30 1318)  PHYSICAL EXAMINATION: Gen. Pleasant, obese, in no distress, normal affect ENT - no pallor,icterus, no post nasal drip, class 2-3 airway Neck: No JVD, no thyromegaly, no carotid bruits Lungs: no use of accessory muscles, no dullness to percussion, decreased without rales or rhonchi  Cardiovascular: Rhythm regular, heart sounds  normal, no murmurs or gallops, 2+ peripheral edema Abdomen: soft and non-tender, no hepatosplenomegaly, BS normal. Musculoskeletal: No deformities, no cyanosis or clubbing Neuro:  alert, non focal, no tremors  Chest x-ray 8/30 personally reviewed which shows worsening right upper lobe  consolidation compared to 8/28  Recent Labs  Lab 05/22/20 1115 05/23/20 0735 05/24/20 0622  NA 138 140 141  K 4.2 4.2 4.4  CL 109 111 111  CO2 21* 21* 22  BUN 73* 75* 68*  CREATININE 3.56* 3.59* 3.57*  GLUCOSE 250* 149* 134*   Recent Labs  Lab 05/22/20 1115 05/22/20 1115 05/22/20 2129 05/23/20 0735 05/24/20 0622  HGB 9.1*   < > 8.0* 8.6* 8.2*  HCT 30.1*   < > 26.8* 29.3* 27.2*  WBC 11.9*  --   --  10.6* 10.5  PLT 247  --   --  264 267   < > = values in this interval not displayed.   DG Chest 2 View  Result Date: 05/24/2020 CLINICAL DATA:  Shortness of breath. EXAM: CHEST - 2 VIEW COMPARISON:  May 22, 2020. FINDINGS: Stable cardiomegaly. No pneumothorax or pleural effusion is noted. Left-sided pacemaker is unchanged in position. Left lung is clear. Increased right upper lobe airspace opacity is noted concerning for worsening pneumonia. Bony thorax is unremarkable. IMPRESSION: Worsening right upper lobe pneumonia. Electronically Signed   By: Marijo Conception M.D.   On: 05/24/2020 10:00   US Venous Img Lower Bilateral (DVT)  Result Date: 05/23/2020 CLINICAL DATA:  History of pulmonary embolism. Evaluate for lower extremity DVT. EXAM: BILATERAL LOWER EXTREMITY VENOUS DOPPLER ULTRASOUND TECHNIQUE: Gray-scale sonography with graded compression, as well as color Doppler and duplex ultrasound were performed to evaluate the lower extremity deep venous systems from the level of the common femoral vein  and including the common femoral, femoral, profunda femoral, popliteal and calf veins including the posterior tibial, peroneal and gastrocnemius veins when visible. The superficial great saphenous vein was also interrogated. Spectral Doppler was utilized to evaluate flow at rest and with distal augmentation maneuvers in the common femoral, femoral and popliteal veins. COMPARISON:  12/22/2019 FINDINGS: RIGHT LOWER EXTREMITY Common Femoral Vein: No evidence of thrombus. Normal compressibility,  respiratory phasicity and response to augmentation. Saphenofemoral Junction: No evidence of thrombus. Normal compressibility and flow on color Doppler imaging. Profunda Femoral Vein: No evidence of thrombus. Normal compressibility and flow on color Doppler imaging. Femoral Vein: No evidence of thrombus. Normal compressibility, respiratory phasicity and response to augmentation. Popliteal Vein: No evidence of thrombus. Normal compressibility, respiratory phasicity and response to augmentation. Calf Veins: Visualized right deep calf veins are patent without thrombus. Limited evaluation. Superficial Great Saphenous Vein: No evidence of thrombus. Normal compressibility. Other Findings:  None. LEFT LOWER EXTREMITY Common Femoral Vein: No evidence of thrombus. Normal compressibility, respiratory phasicity and response to augmentation. Saphenofemoral Junction: No evidence of thrombus. Normal compressibility and flow on color Doppler imaging. Profunda Femoral Vein: No evidence of thrombus. Normal compressibility and flow on color Doppler imaging. Femoral Vein: No evidence of thrombus. Normal compressibility, respiratory phasicity and response to augmentation. Popliteal Vein: No evidence of thrombus. Normal compressibility, respiratory phasicity and response to augmentation. Calf Veins: Visualized left deep calf veins are patent without thrombus. Limited evaluation. Superficial Great Saphenous Vein: No evidence of thrombus. Normal compressibility. Other Findings:  None. IMPRESSION: No evidence of deep venous thrombosis in either lower extremity. Electronically Signed   By: Markus Daft M.D.   On: 05/23/2020 12:21   ECHOCARDIOGRAM COMPLETE  Result Date: 05/23/2020    ECHOCARDIOGRAM REPORT   Patient Name:   LONIE RUMMELL Date of Exam: 05/23/2020 Medical Rec #:  [id number]          Height:       63.0 in Accession #:    [id number]         Weight:       233.0 lb Date of Birth:  July 31, 1941         BSA:          2.063 m  Patient Age:    10 years           BP:           148/63 mmHg Patient Gender: F                  HR:           83 bpm. Exam Location:  [facility] Procedure: 2D Echo, Cardiac Doppler and Color Doppler Indications:    Dyspnea  History:        Patient has prior history of Echocardiogram examinations, most                 recent 12/23/2019. CHF, Pacemaker, Mitral Valve Disease,                 Signs/Symptoms:Shortness of Breath; Risk Factors:Diabetes and                 Hypertension. Sepsis, pneumonia.  Sonographer:    Dustin Flock RDCS Referring Phys: [address]  1. Left ventricular ejection fraction, by estimation, is 60 to 65%. The left ventricle has normal function. The left ventricle has no regional wall motion abnormalities. There is moderate left ventricular hypertrophy. Left ventricular diastolic parameters are consistent with  Grade I diastolic dysfunction (impaired relaxation).  2. Right ventricular systolic function is normal. The right ventricular size is normal. There is severely elevated pulmonary artery systolic pressure. The estimated right ventricular systolic pressure is 62.1 mmHg. Device wire present.  3. Left atrial size was mildly dilated.  4. The mitral valve is abnormal, mildly calcified and with moderate annular calcification. Mild mitral valve regurgitation.  5. The aortic valve is tricuspid. Aortic valve regurgitation is not visualized.  6. The inferior vena cava is dilated in size with <50% respiratory variability, suggesting right atrial pressure of 15 mmHg. FINDINGS  Left Ventricle: Left ventricular ejection fraction, by estimation, is 60 to 65%. The left ventricle has normal function. The left ventricle has no regional wall motion abnormalities. The left ventricular internal cavity size was normal in size. There is  moderate left ventricular hypertrophy. Left ventricular diastolic parameters are consistent with Grade I diastolic dysfunction (impaired relaxation).  Right Ventricle: The right ventricular size is normal. No increase in right ventricular wall thickness. Right ventricular systolic function is normal. There is severely elevated pulmonary artery systolic pressure. The tricuspid regurgitant velocity is 3.49 m/s, and with an assumed right atrial pressure of 15 mmHg, the estimated right ventricular systolic pressure is 30.8 mmHg. Left Atrium: Left atrial size was mildly dilated. Right Atrium: Right atrial size was normal in size. Pericardium: There is no evidence of pericardial effusion. Mitral Valve: The mitral valve is abnormal. There is mild calcification of the mitral valve leaflet(s). Moderate mitral annular calcification. Mild mitral valve regurgitation. Tricuspid Valve: The tricuspid valve is grossly normal. Tricuspid valve regurgitation is mild. Aortic Valve: The aortic valve is tricuspid. Aortic valve regurgitation is not visualized. Mild aortic valve annular calcification. Pulmonic Valve: The pulmonic valve was grossly normal. Pulmonic valve regurgitation is mild. Aorta: The aortic root is normal in size and structure. Venous: The inferior vena cava is dilated in size with less than 50% respiratory variability, suggesting right atrial pressure of 15 mmHg. IAS/Shunts: No atrial level shunt detected by color flow Doppler. Additional Comments: A pacer wire is visualized.  LEFT VENTRICLE PLAX 2D LVIDd:         4.24 cm  Diastology LVIDs:         3.01 cm  LV e' lateral:   3.59 cm/s LV PW:         1.50 cm  LV E/e' lateral: 33.4 LV IVS:        1.52 cm  LV e' medial:    5.44 cm/s LVOT diam:     2.00 cm  LV E/e' medial:  22.1 LV SV:         93 LV SV Index:   45 LVOT Area:     3.14 cm  RIGHT VENTRICLE RV Basal diam:  3.78 cm RV S prime:     9.90 cm/s TAPSE (M-mode): 3.2 cm LEFT ATRIUM             Index       RIGHT ATRIUM           Index LA diam:        4.30 cm 2.08 cm/m  RA Area:     21.90 cm LA Vol (A2C):   52.2 ml 25.30 ml/m RA Volume:   67.60 ml  32.76 ml/m LA  Vol (A4C):   74.4 ml 36.06 ml/m LA Biplane Vol: 69.1 ml 33.49 ml/m  AORTIC VALVE LVOT Vmax:   130.00 cm/s LVOT Vmean:  96.800 cm/s LVOT VTI:  0.295 m  AORTA Ao Root diam: 3.00 cm MITRAL VALVE                TRICUSPID VALVE MV Area (PHT): 3.28 cm     TR Peak grad:   48.7 mmHg MV Decel Time: 231 msec     TR Vmax:        349.00 cm/s MV E velocity: 120.00 cm/s MV A velocity: 125.00 cm/s  SHUNTS MV E/A ratio:  0.96         Systemic VTI:  0.30 m                             Systemic Diam: 2.00 cm Rozann Lesches MD Electronically signed by Rozann Lesches MD Signature Date/Time: 05/23/2020/11:07:51 AM    Final     ASSESSMENT / PLAN:  Acute hypoxic respiratory failure Right upper lobe consolidation, suspected aspiration pneumonia, recent MAC anesthesia No prior history of dysphagia  -Agree with empiric antibiotics and oxygen as needed, work of breathing is acceptable -No evidence of acute VTE on this admission -Some concern that oxygen requirements have increased last 24 hours   Severe pulmonary hypertension -this is new compared to echo from 11/2019 and at least in part related to acute hypoxia .  Likely secondary other causes include diastolic dysfunction and mitral stenosis Doubt CTEPH is a consideration here but if pulmonary hypertension persists on follow-up echo then may need repeat VQ scan in the future  Kara Mead MD. FCCP. [facility]  If no response to pager , please call [phone]  After 7:00 pm call Elink  [phone]   05/24/2020    05/24/2020, 5:09 PM

## 2020-05-25 DIAGNOSIS — E119 Type 2 diabetes mellitus without complications: Secondary | ICD-10-CM

## 2020-05-25 DIAGNOSIS — N184 Chronic kidney disease, stage 4 (severe): Secondary | ICD-10-CM | POA: Diagnosis not present

## 2020-05-25 DIAGNOSIS — E7849 Other hyperlipidemia: Secondary | ICD-10-CM | POA: Diagnosis not present

## 2020-05-25 DIAGNOSIS — K922 Gastrointestinal hemorrhage, unspecified: Secondary | ICD-10-CM

## 2020-05-25 DIAGNOSIS — E1165 Type 2 diabetes mellitus with hyperglycemia: Secondary | ICD-10-CM | POA: Diagnosis not present

## 2020-05-25 DIAGNOSIS — I129 Hypertensive chronic kidney disease with stage 1 through stage 4 chronic kidney disease, or unspecified chronic kidney disease: Secondary | ICD-10-CM | POA: Diagnosis not present

## 2020-05-25 LAB — GLUCOSE, CAPILLARY
Glucose-Capillary: 171 mg/dL — ABNORMAL HIGH (ref 70–99)
Glucose-Capillary: 257 mg/dL — ABNORMAL HIGH (ref 70–99)
Glucose-Capillary: 207 mg/dL — ABNORMAL HIGH (ref 70–99)
Glucose-Capillary: 224 mg/dL — ABNORMAL HIGH (ref 70–99)

## 2020-05-25 LAB — CBC
HCT: 28 % — ABNORMAL LOW (ref 36.0–46.0)
Hemoglobin: 8.4 g/dL — ABNORMAL LOW (ref 12.0–15.0)
MCH: 29.5 pg (ref 26.0–34.0)
MCHC: 30 g/dL (ref 30.0–36.0)
MCV: 98.2 fL (ref 80.0–100.0)
Platelets: 291 10*3/uL (ref 150–400)
RBC: 2.85 MIL/uL — ABNORMAL LOW (ref 3.87–5.11)
RDW: 14.7 % (ref 11.5–15.5)
WBC: 9.5 10*3/uL (ref 4.0–10.5)
nRBC: 0 % (ref 0.0–0.2)

## 2020-05-25 MED ORDER — INSULIN ASPART 100 UNIT/ML ~~LOC~~ SOLN
0.0000 [IU] | Freq: Three times a day (TID) | SUBCUTANEOUS | Status: DC
  Administered 2020-05-26: 2 [IU] via SUBCUTANEOUS
  Administered 2020-05-26: 5 [IU] via SUBCUTANEOUS
  Administered 2020-05-26: 3 [IU] via SUBCUTANEOUS
  Administered 2020-05-27 – 2020-05-28 (×3): 2 [IU] via SUBCUTANEOUS
  Administered 2020-05-28: 1 [IU] via SUBCUTANEOUS

## 2020-05-25 MED ORDER — IPRATROPIUM-ALBUTEROL 0.5-2.5 (3) MG/3ML IN SOLN
3.0000 mL | Freq: Two times a day (BID) | RESPIRATORY_TRACT | Status: DC
  Administered 2020-05-25 – 2020-05-28 (×6): 3 mL via RESPIRATORY_TRACT
  Filled 2020-05-25 (×6): qty 3

## 2020-05-25 MED ORDER — FUROSEMIDE 10 MG/ML IJ SOLN
60.0000 mg | Freq: Two times a day (BID) | INTRAMUSCULAR | Status: DC
  Administered 2020-05-25 (×2): 60 mg via INTRAVENOUS
  Filled 2020-05-25 (×3): qty 6

## 2020-05-25 NOTE — Progress Notes (Signed)
Name: Cassandra Parker MRN: [id number] DOB: 1941/01/18    ADMISSION DATE:  05/22/2020 CONSULTATION DATE:  05/25/2020   REFERRING MD :  Courage, triad   CHIEF COMPLAINT:  resp distress, hypoxia  BRIEF PATIENT DESCRIPTION: 79 year old diabetic with advanced CKD underwent right AV fistula placement 8/24 under MAC anesthesia and presented 8/28 with hypoxia and right upper lobe pneumonia with melena  PMH - CKDIV, s/pright mastectomy and  axillary node dissection HTN, DM2, HLD,History of Complete Heart Block s/p Pacemaker placement, HFpEF, chronic Anemia and h/o PE (11/2019) on Eliquis   SIGNIFICANT EVENTS  8/24 right AV fistula creation under MAC anesthesia 8/28 ED evaluation showed right upper lobe pneumonia, leukocytosis and heme positive stools with hemoglobin 9.1 baseline close to 11. Eliquis was stopped, she was treated with Rocephin and azithromycin, Lasix was continued , aspiration is suspected.  STUDIES:  Echo 8/29 normal LVEF, moderate LVH, grade 1 diastolic dysfunction, RVSP 64 Echo 11/2019 mildly elevated RVSP 11/2019 intermediate VQ scan 11/2019 venous duplex negative  8/29 venous duplex negative     SUBJECTIVE:  Denies chest pain Afebrile Not much urine output recorded with Lasix Remains on 10 L nasal cannula  VITAL SIGNS: Temp:  [97.9 F (36.6 C)-98.3 F (36.8 C)] 98.3 F (36.8 C) (08/31 0520) Pulse Rate:  [78-89] 89 (08/31 0520) Resp:  [18-22] 18 (08/31 0520) BP: (139-146)/(48-53) 139/48 (08/31 0520) SpO2:  [91 %-96 %] 92 % (08/31 0802)  PHYSICAL EXAMINATION: Gen. Pleasant, obese, in no distress ENT - no lesions, no post nasal drip Neck: No JVD, no thyromegaly, no carotid bruits Lungs: no use of accessory muscles, no dullness to percussion, decreased without rales or rhonchi  Cardiovascular: Rhythm regular, heart sounds  normal, no murmurs or gallops, 2+ peripheral edema Musculoskeletal: No deformities, no cyanosis or clubbing , no tremors   Chest  x-ray 8/30 personally reviewed which shows worsening right upper lobe consolidation compared to 8/28  Recent Labs  Lab 05/22/20 1115 05/23/20 0735 05/24/20 0622  NA 138 140 141  K 4.2 4.2 4.4  CL 109 111 111  CO2 21* 21* 22  BUN 73* 75* 68*  CREATININE 3.56* 3.59* 3.57*  GLUCOSE 250* 149* 134*   Recent Labs  Lab 05/23/20 0735 05/24/20 0622 05/25/20 0959  HGB 8.6* 8.2* 8.4*  HCT 29.3* 27.2* 28.0*  WBC 10.6* 10.5 9.5  PLT 264 267 291   DG Chest 2 View  Result Date: 05/24/2020 CLINICAL DATA:  Shortness of breath. EXAM: CHEST - 2 VIEW COMPARISON:  May 22, 2020. FINDINGS: Stable cardiomegaly. No pneumothorax or pleural effusion is noted. Left-sided pacemaker is unchanged in position. Left lung is clear. Increased right upper lobe airspace opacity is noted concerning for worsening pneumonia. Bony thorax is unremarkable. IMPRESSION: Worsening right upper lobe pneumonia. Electronically Signed   By: Marijo Conception M.D.   On: 05/24/2020 10:00    ASSESSMENT / PLAN:  Acute hypoxic respiratory failure Right upper lobe consolidation, suspected aspiration pneumonia, recent MAC anesthesia No prior history of dysphagia Remains very hypoxic requiring 10 L nasal cannula, no reason to suspect PE was compliant with Eliquis  -Agree with empiric antibiotics and oxygen as needed, work of breathing is acceptable -No evidence of acute VTE on this admission -Repeat chest x-ray in a.m.    Severe pulmonary hypertension -this is new compared to echo from 11/2019 and at least in part related to acute hypoxia .  Likely secondary other causes include diastolic dysfunction and hypoxia, mitral stenosis is mild, no  evidence of acute pulmonary edema Doubt CTEPH is a consideration  We will follow, updated daughter at bedside and discussed with hospitalist  Kara Mead MD. [facility]. [facility]  If no response to pager , please call [phone]  After 7:00 pm call Elink  [phone]    05/25/2020    05/25/2020, 2:28 PM

## 2020-05-25 NOTE — Progress Notes (Signed)
Patient Demographics:    Cassandra Parker, is a 79 y.o. female, DOB - [birth year], ZOX:[id number]  Admit date - 05/22/2020   Admitting Physician Tyre Beaver Denton Brick, MD  Outpatient Primary MD for the patient is [name], [name], MD  LOS - 3  Chief Complaint  Patient presents with  . Shortness of Breath        Subjective:    Cassandra Parker today has no fevers, no emesis,  No chest pain,    -Patient sitting up in the chair, daughter at bedside, cough and shortness of breath persist -O2 sats 88 to 90% on 10 L at rest   Assessment  & Plan :    Principal Problem:   Sepsis due to PNA Active Problems:   Cardiomyopathy- EF 35-40% (etiology not yet determined)   Respiratory failure with hypoxia (HCC)   Rt U L Pneumonia   GI bleed   HTN (hypertension)   Diabetes mellitus type 2, controlled, without complications (HCC)   Cancer of breast- s/p chemo and Rt mastectomy 2008   Mitral stenosis- moderate with trivial MR    Severe Pulmonary HTN--  Brief Summary:- 79 y.o. female with CKDIV, s/p right mastectomy and presumably axillary node dissection, s/p pacemaker on the left side, s/p  AV fistula creation on 05/18/2020, HTN, DM2, HLD, History of Complete Heart Block s/p Pacemaker placement, HFpEF, chronic Anemia and h/o PE (11/2019) on Eliquis admitted with acute hypoxic respiratory failure secondary to pneumonia with echo showing severe pulmonary hypertension --O2 sats 88 to 90% on 10 L at rest  A/p 1)Sepsis Secondary to Pneumonia----Patient met sepsis criteria on admission with finding of right-sided pneumonia, leukocytosis,  tachypnea, and hypoxia -No tachycardia most likely due to carvedilol use -Lactic acid was not elevated -shob and hypoxia and cough persist -WBC 11.9 >> 10.5 >>9.5 CXR on 05/24/20 with persistent right-sided pneumonia -Continue IV Rocephin and azithromycin, mucolytics and supplemental  oxygen as ordered --O2 sats 88 to 90% on 10 L at rest   2) GI bleed--- patient with dark stools after restarting Eliquis, --Hgb 10.5 >> 9.1 >>8.0 >>8.6>>8.2 >>8.4 ( baseline that is usually close to 11) -c/n IV Protonix -- stool for occult blood -- Positive -Transfuse as clinically indicated -Stopped Eliquis -GI consult from Dr. Gala Romney appreciated for possible endoluminal evaluation -Respiratory status precludes endoluminal evaluation/sedation at this time -H&H appears to have stabilized after stopping Eliquis And  after initial drop  3)HFpEF/chronic diastolic dysfunction CHF/pulmonary hypertension--elevated BNP noted, -Echo from 05/23/2020 with EF of 60 to 65% with severe pulmonary hypertension and grade 1 diastolic dysfunction -PCCM consult requested for severe pulmonary hypertension -Troponin is 114>> 117--- this is consistent with patient prior baseline --- bnp 1,462 no baseline available --Chest x-ray without overt failure, hypoxia most likely from #1 above -c/n  Coreg -Change Lasix to IV 60 mg twice daily--we may have to tolerate further increase in creatinine and after diuresis -Repeat chest x-ray in a.m.  4)Acute hypoxic respiratory failure/severe pulmonary hypertension --- likely secondary to pneumonia as above #1 --She is found to be hypoxic with O2 sats of 68 % on room air, currently on 6 L of oxygen---  --previously no hypoxia and PTA patient was not on home O2 --- Manage as above #1 -PCCM consult  requested  5)RUL PNA-- CAP--- suspect aspiration related, chest x-ray suggestive of right upper lobe pneumonia in the setting of recent anesthesia/sedation for AV fistula creation on 05/18/2020 raises concern about possible aspiration -Repeat CXR on 05/24/20 noted --Manage as above #1  6)H/o PE--diagnosed in March 2021 with VQ scan that was intermediate in the setting of elevated D-dimer -Patient has severe pulmonary hypertension-PCCM consult requested for severe pulmonary  hypertension -Patient was unable to do CTA chest at that time due to kidney concerns -He has been compliant with Eliquis except for  holding her Eliquis from 05/14/2020 through 07/18/2020 inclusive to allow for right upper extremity AV fistula creation, she restarted Eliquis on 05/19/2020 --- Given dark stools and drop in H&H we will hold Eliquis  --lower extremity venous Dopplers--without acute DVT  7)History of Complete Heart Block s/p Pacemaker placement -EKG shows paced rhythm. Stable. Routine follow-up as outpatient.  8)CKD IV--creatinine currently 3.5 which is not far from a recent baseline --Status post AV fistula creation on 05/18/2020 in anticipation of HD in the near term -- renally adjust medications, avoid nephrotoxic agents / dehydration  / hypotension   9)Diabetes mellitus type 2 -Sliding scale insulin coverage with fingerstick monitoring -Hold glipizide for now  10)History of Rt Breast cancer -S/p chemo and Rt mastectomy 2008 Status post right-sided mastectomy and chemotherapy -Stable, follow-up as outpatient  11)Acute on chronicAnemia--at baseline patient has anemia of CKD -Suspect worsening anemia with hemoglobin  --Hgb 10.5 >> 9.1 >>8.0 >>8.6>>8.2>.8.4 --baseline close to 11 is due to acute GI bleed/ABLA -IV Protonix as above #2, GI consult noted  code Status:full  Family Communication: (patient is alert, awake and coherent) Previously discussed with daughter   Disposition/Need for in-Hospital Stay- patient unable to be discharged at this time due to --Sepsis treatment for pneumonia requiring Iv antibiotics and Gi Bleed requiring further endoluminal evaluation  Status is: Inpatient  Remains inpatient appropriate because:Sepsis treatment for pneumonia requiring Iv antibiotics and Gi Bleed requiring further endoluminal evaluation   Disposition: The patient is from: Home              Anticipated d/c is to: Home              Anticipated d/c date is:  2 days              Patient currently is not medically stable to d/c. Barriers: Not Clinically Stable- Sepsis treatment for pneumonia requiring Iv antibiotics and Gi Bleed requiring further endoluminal evaluation  Consults  :  Gi  DVT Prophylaxis  :    - SCDs (Gi bleed)  Lab Results  Component Value Date   PLT 291 05/25/2020    Inpatient Medications  Scheduled Meds: . amLODipine  10 mg Oral Daily  . carvedilol  3.125 mg Oral BID  . cholecalciferol  2,000 Units Oral Daily  . furosemide  40 mg Oral Daily  . guaiFENesin  600 mg Oral BID  . hydrALAZINE  50 mg Oral BID  . insulin aspart  0-5 Units Subcutaneous QHS  . insulin aspart  0-6 Units Subcutaneous TID WC  . ipratropium-albuterol  3 mL Nebulization TID  . multivitamin with minerals  1 tablet Oral Daily  . pantoprazole (PROTONIX) IV  40 mg Intravenous Q12H  . potassium chloride  10 mEq Oral Daily  . pravastatin  10 mg Oral QHS  . sodium chloride flush  3 mL Intravenous Q12H  . vitamin B-12  1,000 mcg Oral Daily   Continuous Infusions: . sodium  chloride    . azithromycin 500 mg (05/25/20 1053)  . cefTRIAXone (ROCEPHIN)  IV 2 g (05/25/20 1014)   PRN Meds:.sodium chloride, acetaminophen **OR** acetaminophen, albuterol, HYDROcodone-acetaminophen, labetalol, ondansetron **OR** ondansetron (ZOFRAN) IV, polyethylene glycol, sodium chloride flush, traZODone    Anti-infectives (From admission, onward)   Start     Dose/Rate Route Frequency Ordered Stop   05/23/20 1000  cefTRIAXone (ROCEPHIN) 2 g in sodium chloride 0.9 % 100 mL IVPB        2 g 200 mL/hr over 30 Minutes Intravenous Every 24 hours 05/22/20 1640     05/23/20 1000  azithromycin (ZITHROMAX) 500 mg in sodium chloride 0.9 % 250 mL IVPB        500 mg 250 mL/hr over 60 Minutes Intravenous Every 24 hours 05/22/20 1640     05/22/20 1130  cefTRIAXone (ROCEPHIN) 1 g in sodium chloride 0.9 % 100 mL IVPB        1 g 200 mL/hr over 30 Minutes Intravenous  Once 05/22/20 1129  05/22/20 1230   05/22/20 1130  azithromycin (ZITHROMAX) 500 mg in sodium chloride 0.9 % 250 mL IVPB        500 mg 250 mL/hr over 60 Minutes Intravenous  Once 05/22/20 1129 05/22/20 1346        Objective:   Vitals:   05/24/20 2058 05/25/20 0448 05/25/20 0520 05/25/20 0802  BP:  (!) 146/49 (!) 139/48   Pulse:  87 89   Resp:  20 18   Temp:  97.9 F (36.6 C) 98.3 F (36.8 C)   TempSrc:   Oral   SpO2: 96% 95% 91% 92%  Weight:      Height:        Wt Readings from Last 3 Encounters:  05/22/20 105.7 kg  05/18/20 103.4 kg  05/14/20 103.7 kg     Intake/Output Summary (Last 24 hours) at 05/25/2020 1142 Last data filed at 05/25/2020 0300 Gross per 24 hour  Intake 1060 ml  Output 300 ml  Net 760 ml   Physical Exam  Gen:- Awake Alert,  morbidly obese HEENT:- [place].AT, No sclera icterus Nose- [place] 10L/min Neck-Supple Neck ,. +ve JVD Lungs-diminished in bases, scattered rhonchi on the Rt CV- S1, S2 normal, regular  Abd-  +ve B.Sounds, Abd Soft, increased truncal adiposity Extremity/Skin:-Bilateral lower extremity edema right more than left, pedal pulses present Psych-affect is appropriate, oriented x3 Neuro-generalized weakness ,no new focal deficits, no tremors   Data Review:   Micro Results Recent Results (from the past 240 hour(s))  SARS CORONAVIRUS 2 (TAT 6-24 HRS) Nasopharyngeal Nasopharyngeal Swab     Status: None   Collection Time: 05/17/20  9:53 AM   Specimen: Nasopharyngeal Swab  Result Value Ref Range Status   SARS Coronavirus 2 NEGATIVE NEGATIVE Final    Comment: (NOTE) SARS-CoV-2 target nucleic acids are NOT DETECTED.  The SARS-CoV-2 RNA is generally detectable in upper and lower respiratory specimens during the acute phase of infection. Negative results do not preclude SARS-CoV-2 infection, do not rule out co-infections with other pathogens, and should not be used as the sole basis for treatment or other patient management decisions. Negative results must be  combined with clinical observations, patient history, and epidemiological information. The expected result is Negative.  Fact Sheet for Patients: SugarRoll.be  Fact Sheet for Healthcare Providers: https://www.woods-mathews.com/  This test is not yet approved or cleared by the [place] FDA and  has been authorized for detection and/or diagnosis of SARS-CoV-2 by FDA under an  Emergency Use Authorization (EUA). This EUA will remain  in effect (meaning this test can be used) for the duration of the COVID-19 declaration under Se ction 564(b)(1) of the Act, 21 U.S.C. section 360bbb-3(b)(1), unless the authorization is terminated or revoked sooner.  Performed at [facility], [place] [address] [place], [place] [address]   Culture, blood (Routine X 2) w Reflex to ID Panel     Status: None (Preliminary result)   Collection Time: 05/22/20 11:17 AM   Specimen: Left Antecubital; Blood  Result Value Ref Range Status   Specimen Description LEFT ANTECUBITAL  Final   Special Requests   Final    BOTTLES DRAWN AEROBIC AND ANAEROBIC Blood Culture adequate volume   Culture   Final    NO GROWTH 3 DAYS Performed at [facility], [address], [place], [place] [address]    Report Status PENDING  Incomplete  SARS Coronavirus 2 by RT PCR (hospital order, performed in [place] hospital lab) Nasopharyngeal Nasopharyngeal Swab     Status: None   Collection Time: 05/22/20 12:01 PM   Specimen: Nasopharyngeal Swab  Result Value Ref Range Status   SARS Coronavirus 2 NEGATIVE NEGATIVE Final    Comment: (NOTE) SARS-CoV-2 target nucleic acids are NOT DETECTED.  The SARS-CoV-2 RNA is generally detectable in upper and lower respiratory specimens during the acute phase of infection. The lowest concentration of SARS-CoV-2 viral copies this assay can detect is 250 copies / mL. A negative result does not preclude SARS-CoV-2 infection and should not be used as  the sole basis for treatment or other patient management decisions.  A negative result may occur with improper specimen collection / handling, submission of specimen other than nasopharyngeal swab, presence of viral mutation(s) within the areas targeted by this assay, and inadequate number of viral copies (<250 copies / mL). A negative result must be combined with clinical observations, patient history, and epidemiological information.  Fact Sheet for Patients:   StrictlyIdeas.no  Fact Sheet for Healthcare Providers: BankingDealers.co.za  This test is not yet approved or  cleared by the [place] FDA and has been authorized for detection and/or diagnosis of SARS-CoV-2 by FDA under an Emergency Use Authorization (EUA).  This EUA will remain in effect (meaning this test can be used) for the duration of the COVID-19 declaration under Section 564(b)(1) of the Act, 21 U.S.C. section 360bbb-3(b)(1), unless the authorization is terminated or revoked sooner.  Performed at [facility], [address] [place], [place] [address]   Culture, blood (Routine X 2) w Reflex to ID Panel     Status: None (Preliminary result)   Collection Time: 05/22/20  6:40 PM   Specimen: BLOOD  Result Value Ref Range Status   Specimen Description BLOOD BLOOD LEFT ARM  Final   Special Requests   Final    BOTTLES DRAWN AEROBIC AND ANAEROBIC Blood Culture results may not be optimal due to an inadequate volume of blood received in culture bottles   Culture   Final    NO GROWTH 3 DAYS Performed at [facility], [address], [place], [place] [address]    Report Status PENDING  Incomplete    Radiology Reports DG Chest 2 View  Result Date: 05/24/2020 CLINICAL DATA:  Shortness of breath. EXAM: CHEST - 2 VIEW COMPARISON:  May 22, 2020. FINDINGS: Stable cardiomegaly. No pneumothorax or pleural effusion is noted. Left-sided pacemaker is unchanged in position. Left  lung is clear. Increased right upper lobe airspace opacity is noted  concerning for worsening pneumonia. Bony thorax is unremarkable. IMPRESSION: Worsening right upper lobe pneumonia. Electronically Signed   By: Marijo Conception M.D.   On: 05/24/2020 10:00   US Venous Img Lower Bilateral (DVT)  Result Date: 05/23/2020 CLINICAL DATA:  History of pulmonary embolism. Evaluate for lower extremity DVT. EXAM: BILATERAL LOWER EXTREMITY VENOUS DOPPLER ULTRASOUND TECHNIQUE: Gray-scale sonography with graded compression, as well as color Doppler and duplex ultrasound were performed to evaluate the lower extremity deep venous systems from the level of the common femoral vein and including the common femoral, femoral, profunda femoral, popliteal and calf veins including the posterior tibial, peroneal and gastrocnemius veins when visible. The superficial great saphenous vein was also interrogated. Spectral Doppler was utilized to evaluate flow at rest and with distal augmentation maneuvers in the common femoral, femoral and popliteal veins. COMPARISON:  12/22/2019 FINDINGS: RIGHT LOWER EXTREMITY Common Femoral Vein: No evidence of thrombus. Normal compressibility, respiratory phasicity and response to augmentation. Saphenofemoral Junction: No evidence of thrombus. Normal compressibility and flow on color Doppler imaging. Profunda Femoral Vein: No evidence of thrombus. Normal compressibility and flow on color Doppler imaging. Femoral Vein: No evidence of thrombus. Normal compressibility, respiratory phasicity and response to augmentation. Popliteal Vein: No evidence of thrombus. Normal compressibility, respiratory phasicity and response to augmentation. Calf Veins: Visualized right deep calf veins are patent without thrombus. Limited evaluation. Superficial Great Saphenous Vein: No evidence of thrombus. Normal compressibility. Other Findings:  None. LEFT LOWER EXTREMITY Common Femoral Vein: No evidence of thrombus. Normal  compressibility, respiratory phasicity and response to augmentation. Saphenofemoral Junction: No evidence of thrombus. Normal compressibility and flow on color Doppler imaging. Profunda Femoral Vein: No evidence of thrombus. Normal compressibility and flow on color Doppler imaging. Femoral Vein: No evidence of thrombus. Normal compressibility, respiratory phasicity and response to augmentation. Popliteal Vein: No evidence of thrombus. Normal compressibility, respiratory phasicity and response to augmentation. Calf Veins: Visualized left deep calf veins are patent without thrombus. Limited evaluation. Superficial Great Saphenous Vein: No evidence of thrombus. Normal compressibility. Other Findings:  None. IMPRESSION: No evidence of deep venous thrombosis in either lower extremity. Electronically Signed   By: Markus Daft M.D.   On: 05/23/2020 12:21   DG Chest Portable 1 View  Result Date: 05/22/2020 CLINICAL DATA:  Shortness of breath, hypoxemia, end-stage renal disease on dialysis, LEFT-sided crackles, RIGHT-side week EXAM: PORTABLE CHEST 1 VIEW COMPARISON:  Portable exam 1042 hours compared to 12/21/2019 FINDINGS: LEFT subclavian sequential pacemaker with leads projecting over RIGHT atrium, RIGHT ventricle, and coronary sinus. Enlargement of cardiac silhouette with vascular congestion. Minimal LEFT base atelectasis. RIGHT perihilar opacities extending into RIGHT upper lobe favor pneumonia although requiring follow-up until resolution to exclude underlying nodule. Question mild enlargement of RIGHT hilum versus perihilar infiltrate. Remaining lungs clear. No pleural effusion or pneumothorax. Bones demineralized. IMPRESSION: Enlargement of cardiac silhouette post pacemaker. Questionable RIGHT hilar enlargement/adenopathy versus perihilar infiltrate. RIGHT upper lobe pneumonia; follow-up exams until resolution recommended to exclude underlying pulmonary mass/nodule and hilar adenopathy. Electronically Signed   By:  Lavonia Dana M.D.   On: 05/22/2020 11:16   VAS [place] UPPER EXTREMITY ARTERIAL DUPLEX  Result Date: 04/29/2020 UPPER EXTREMITY DUPLEX STUDY Indications: Patient complains of HD access pre op.  Performing Technologist: Ralene Cork RVT  Examination Guidelines: A complete evaluation includes B-mode imaging, spectral Doppler, color Doppler, and power Doppler as needed of all accessible portions of each vessel. Bilateral testing is considered an integral part of a complete examination. Limited examinations for reoccurring  indications may be performed as noted.  Right Pre-Dialysis Findings: +-----------------------+----------+--------------------+---------+--------+ Location               PSV (cm/s)Intralum. Diam. (cm)Waveform Comments +-----------------------+----------+--------------------+---------+--------+ Brachial Antecub. fossa98        0.50                triphasic         +-----------------------+----------+--------------------+---------+--------+ Radial Art at Wrist    76        0.19                triphasic         +-----------------------+----------+--------------------+---------+--------+ Ulnar Art at Wrist     66        0.16                triphasic         +-----------------------+----------+--------------------+---------+--------+ Left Pre-Dialysis Findings: +-----------------------+----------+--------------------+---------+--------+ Location               PSV (cm/s)Intralum. Diam. (cm)Waveform Comments +-----------------------+----------+--------------------+---------+--------+ Brachial Antecub. fossa81        0.52                triphasic         +-----------------------+----------+--------------------+---------+--------+ Radial Art at Wrist    58        0.21                triphasic         +-----------------------+----------+--------------------+---------+--------+ Ulnar Art at Wrist     61        0.15                triphasic          +-----------------------+----------+--------------------+---------+--------+  Summary:  Right: Patent brachial, radial, and ulnar arteries with calcified        walls in the radial and ulnar arteries. Left: Patent brachial, radial, and ulnar arteries with calcified       walls in the radial and ulnar arteries. *See table(s) above for measurements and observations. Electronically signed by Ruta Hinds MD on 04/29/2020 at 11:57:00 AM.    Final    ECHOCARDIOGRAM COMPLETE  Result Date: 05/23/2020    ECHOCARDIOGRAM REPORT   Patient Name:   ZARINA PE Date of Exam: 05/23/2020 Medical Rec #:  [id number]          Height:       63.0 in Accession #:    [id number]         Weight:       233.0 lb Date of Birth:  11-Aug-1941         BSA:          2.063 m Patient Age:    35 years           BP:           148/63 mmHg Patient Gender: F                  HR:           83 bpm. Exam Location:  [facility] Procedure: 2D Echo, Cardiac Doppler and Color Doppler Indications:    Dyspnea  History:        Patient has prior history of Echocardiogram examinations, most                 recent 12/23/2019. CHF, Pacemaker, Mitral Valve Disease,  Signs/Symptoms:Shortness of Breath; Risk Factors:Diabetes and                 Hypertension. Sepsis, pneumonia.  Sonographer:    Dustin Flock RDCS Referring Phys: [place]  1. Left ventricular ejection fraction, by estimation, is 60 to 65%. The left ventricle has normal function. The left ventricle has no regional wall motion abnormalities. There is moderate left ventricular hypertrophy. Left ventricular diastolic parameters are consistent with Grade I diastolic dysfunction (impaired relaxation).  2. Right ventricular systolic function is normal. The right ventricular size is normal. There is severely elevated pulmonary artery systolic pressure. The estimated right ventricular systolic pressure is 06.3 mmHg. Device wire present.  3. Left atrial size was  mildly dilated.  4. The mitral valve is abnormal, mildly calcified and with moderate annular calcification. Mild mitral valve regurgitation.  5. The aortic valve is tricuspid. Aortic valve regurgitation is not visualized.  6. The inferior vena cava is dilated in size with <50% respiratory variability, suggesting right atrial pressure of 15 mmHg. FINDINGS  Left Ventricle: Left ventricular ejection fraction, by estimation, is 60 to 65%. The left ventricle has normal function. The left ventricle has no regional wall motion abnormalities. The left ventricular internal cavity size was normal in size. There is  moderate left ventricular hypertrophy. Left ventricular diastolic parameters are consistent with Grade I diastolic dysfunction (impaired relaxation). Right Ventricle: The right ventricular size is normal. No increase in right ventricular wall thickness. Right ventricular systolic function is normal. There is severely elevated pulmonary artery systolic pressure. The tricuspid regurgitant velocity is 3.49 m/s, and with an assumed right atrial pressure of 15 mmHg, the estimated right ventricular systolic pressure is 01.6 mmHg. Left Atrium: Left atrial size was mildly dilated. Right Atrium: Right atrial size was normal in size. Pericardium: There is no evidence of pericardial effusion. Mitral Valve: The mitral valve is abnormal. There is mild calcification of the mitral valve leaflet(s). Moderate mitral annular calcification. Mild mitral valve regurgitation. Tricuspid Valve: The tricuspid valve is grossly normal. Tricuspid valve regurgitation is mild. Aortic Valve: The aortic valve is tricuspid. Aortic valve regurgitation is not visualized. Mild aortic valve annular calcification. Pulmonic Valve: The pulmonic valve was grossly normal. Pulmonic valve regurgitation is mild. Aorta: The aortic root is normal in size and structure. Venous: The inferior vena cava is dilated in size with less than 50% respiratory variability,  suggesting right atrial pressure of 15 mmHg. IAS/Shunts: No atrial level shunt detected by color flow Doppler. Additional Comments: A pacer wire is visualized.  LEFT VENTRICLE PLAX 2D LVIDd:         4.24 cm  Diastology LVIDs:         3.01 cm  LV e' lateral:   3.59 cm/s LV PW:         1.50 cm  LV E/e' lateral: 33.4 LV IVS:        1.52 cm  LV e' medial:    5.44 cm/s LVOT diam:     2.00 cm  LV E/e' medial:  22.1 LV SV:         93 LV SV Index:   45 LVOT Area:     3.14 cm  RIGHT VENTRICLE RV Basal diam:  3.78 cm RV S prime:     9.90 cm/s TAPSE (M-mode): 3.2 cm LEFT ATRIUM             Index       RIGHT ATRIUM  Index LA diam:        4.30 cm 2.08 cm/m  RA Area:     21.90 cm LA Vol (A2C):   52.2 ml 25.30 ml/m RA Volume:   67.60 ml  32.76 ml/m LA Vol (A4C):   74.4 ml 36.06 ml/m LA Biplane Vol: 69.1 ml 33.49 ml/m  AORTIC VALVE LVOT Vmax:   130.00 cm/s LVOT Vmean:  96.800 cm/s LVOT VTI:    0.295 m  AORTA Ao Root diam: 3.00 cm MITRAL VALVE                TRICUSPID VALVE MV Area (PHT): 3.28 cm     TR Peak grad:   48.7 mmHg MV Decel Time: 231 msec     TR Vmax:        349.00 cm/s MV E velocity: 120.00 cm/s MV A velocity: 125.00 cm/s  SHUNTS MV E/A ratio:  0.96         Systemic VTI:  0.30 m                             Systemic Diam: 2.00 cm Rozann Lesches MD Electronically signed by Rozann Lesches MD Signature Date/Time: 05/23/2020/11:07:51 AM    Final    VAS [place] UPPER EXTREMITY VEIN MAPPING  Result Date: 04/29/2020 UPPER EXTREMITY VEIN MAPPING  Indications: Pre-access. Performing Technologist: Ralene Cork RVT  Examination Guidelines: A complete evaluation includes B-mode imaging, spectral Doppler, color Doppler, and power Doppler as needed of all accessible portions of each vessel. Bilateral testing is considered an integral part of a complete examination. Limited examinations for reoccurring indications may be performed as noted. +-----------------+-------------+----------+--------+ Right Cephalic    Diameter (cm)Depth (cm)Findings +-----------------+-------------+----------+--------+ Shoulder             0.55                        +-----------------+-------------+----------+--------+ Prox upper arm       0.53                        +-----------------+-------------+----------+--------+ Mid upper arm        0.48                        +-----------------+-------------+----------+--------+ Dist upper arm       0.47                        +-----------------+-------------+----------+--------+ Antecubital fossa    0.66                        +-----------------+-------------+----------+--------+ Prox forearm         0.34                        +-----------------+-------------+----------+--------+ Mid forearm          0.30                        +-----------------+-------------+----------+--------+ Dist forearm         0.24               lateral  +-----------------+-------------+----------+--------+ +-----------------+-------------+----------+--------+ Right Basilic    Diameter (cm)Depth (cm)Findings +-----------------+-------------+----------+--------+ Mid upper arm        0.43                        +-----------------+-------------+----------+--------+  Dist upper arm       0.52                        +-----------------+-------------+----------+--------+ Antecubital fossa    0.28                        +-----------------+-------------+----------+--------+ +-----------------+-------------+----------+--------------+ Left Cephalic    Diameter (cm)Depth (cm)   Findings    +-----------------+-------------+----------+--------------+ Shoulder             0.28                              +-----------------+-------------+----------+--------------+ Prox upper arm       0.18                              +-----------------+-------------+----------+--------------+ Mid upper arm        0.18                               +-----------------+-------------+----------+--------------+ Dist upper arm       0.22                              +-----------------+-------------+----------+--------------+ Antecubital fossa    0.25                              +-----------------+-------------+----------+--------------+ Prox forearm         0.16                              +-----------------+-------------+----------+--------------+ Mid forearm          0.12                              +-----------------+-------------+----------+--------------+ Dist forearm                            not visualized +-----------------+-------------+----------+--------------+ +-----------------+-------------+----------+--------+ Left Basilic     Diameter (cm)Depth (cm)Findings +-----------------+-------------+----------+--------+ Mid upper arm        0.38                        +-----------------+-------------+----------+--------+ Dist upper arm       0.48                        +-----------------+-------------+----------+--------+ Antecubital fossa    0.25                        +-----------------+-------------+----------+--------+ Summary: Right: Patent cephalic and basilic veins. Left: Patent cephalic and basilic veins. *See table(s) above for measurements and observations.  Diagnosing physician: Ruta Hinds MD Electronically signed by Ruta Hinds MD on 04/29/2020 at 11:57:08 AM.    Final      CBC Recent Labs  Lab 05/22/20 1115 05/22/20 2129 05/23/20 0735 05/24/20 0622 05/25/20 0959  WBC 11.9*  --  10.6* 10.5 9.5  HGB 9.1* 8.0* 8.6* 8.2* 8.4*  HCT 30.1* 26.8* 29.3* 27.2* 28.0*  PLT 247  --  264 267 291  MCV 96.5  --  97.3 98.2 98.2  MCH 29.2  --  28.6 29.6 29.5  MCHC 30.2  --  29.4* 30.1 30.0  RDW 14.9  --  14.8 14.8 14.7  LYMPHSABS 1.7  --   --   --   --   MONOABS 0.9  --   --   --   --   EOSABS 0.0  --   --   --   --   BASOSABS 0.1  --   --   --   --     Chemistries  Recent Labs  Lab  05/22/20 1115 05/23/20 0735 05/24/20 0622  NA 138 140 141  K 4.2 4.2 4.4  CL 109 111 111  CO2 21* 21* 22  GLUCOSE 250* 149* 134*  BUN 73* 75* 68*  CREATININE 3.56* 3.59* 3.57*  CALCIUM 8.5* 8.5* 8.3*  AST 46*  --   --   ALT 34  --   --   ALKPHOS 67  --   --   BILITOT 0.6  --   --    ------------------------------------------------------------------------------------------------------------------ No results for input(s): CHOL, HDL, LDLCALC, TRIG, CHOLHDL, LDLDIRECT in the last 72 hours.  Lab Results  Component Value Date   HGBA1C 7.4 (H) 05/22/2020   ------------------------------------------------------------------------------------------------------------------ No results for input(s): TSH, T4TOTAL, T3FREE, THYROIDAB in the last 72 hours.  Invalid input(s): FREET3 ------------------------------------------------------------------------------------------------------------------ No results for input(s): VITAMINB12, FOLATE, FERRITIN, TIBC, IRON, RETICCTPCT in the last 72 hours.  Coagulation profile Recent Labs  Lab 05/22/20 1115  INR 1.9*   No results for input(s): DDIMER in the last 72 hours.  Cardiac Enzymes No results for input(s): CKMB, TROPONINI, MYOGLOBIN in the last 168 hours.  Invalid input(s): CK ------------------------------------------------------------------------------------------------------------------    Component Value Date/Time   BNP 1,462.0 (H) 05/22/2020 1115    Roxan Hockey M.D on 05/25/2020 at 11:42 AM  Go to [email or web address] - for contact info  [facility] - Office  [phone]

## 2020-05-25 NOTE — Progress Notes (Addendum)
Subjective: No abdominal pain. Stool this morning brown. On 10 liters O2. Still with dyspnea on exertion. Sitting up in chair.   Objective: Vital signs in last 24 hours: Temp:  [97.9 F (36.6 C)-98.3 F (36.8 C)] 98.3 F (36.8 C) (08/31 0520) Pulse Rate:  [78-89] 89 (08/31 0520) Resp:  [18-22] 18 (08/31 0520) BP: (139-146)/(48-53) 139/48 (08/31 0520) SpO2:  [91 %-96 %] 92 % (08/31 0802) Last BM Date: 05/23/20 General:   Alert and oriented, pleasant, 10 liters O2 oxygen per nasal cannula. Short-winded with moving around room.  Head:  Normocephalic and atraumatic. Abdomen:  Bowel sounds present, soft, non-tender, non-distended. Obese. Sitting up in chair Extremities:  With 3+ pitting pedal edema, 1-2+ lower extremity edema to knee Neurologic:  Alert and  oriented x4  Intake/Output from previous day: 08/30 0701 - 08/31 0700 In: 1060 [P.O.:360; IV Piggyback:700] Out: 300 [Urine:300] Intake/Output this shift: No intake/output data recorded.  Lab Results: Recent Labs    05/22/20 1115 05/22/20 1115 05/22/20 2129 05/23/20 0735 05/24/20 0622  WBC 11.9*  --   --  10.6* 10.5  HGB 9.1*   < > 8.0* 8.6* 8.2*  HCT 30.1*   < > 26.8* 29.3* 27.2*  PLT 247  --   --  264 267   < > = values in this interval not displayed.   BMET Recent Labs    05/22/20 1115 05/23/20 0735 05/24/20 0622  NA 138 140 141  K 4.2 4.2 4.4  CL 109 111 111  CO2 21* 21* 22  GLUCOSE 250* 149* 134*  BUN 73* 75* 68*  CREATININE 3.56* 3.59* 3.57*  CALCIUM 8.5* 8.5* 8.3*   LFT Recent Labs    05/22/20 1115  PROT 7.0  ALBUMIN 3.3*  AST 46*  ALT 34  ALKPHOS 67  BILITOT 0.6   PT/INR Recent Labs    05/22/20 1115  LABPROT 21.1*  INR 1.9*     Studies/Results: DG Chest 2 View  Result Date: 05/24/2020 CLINICAL DATA:  Shortness of breath. EXAM: CHEST - 2 VIEW COMPARISON:  May 22, 2020. FINDINGS: Stable cardiomegaly. No pneumothorax or pleural effusion is noted. Left-sided pacemaker is  unchanged in position. Left lung is clear. Increased right upper lobe airspace opacity is noted concerning for worsening pneumonia. Bony thorax is unremarkable. IMPRESSION: Worsening right upper lobe pneumonia. Electronically Signed   By: Marijo Conception M.D.   On: 05/24/2020 10:00   US Venous Img Lower Bilateral (DVT)  Result Date: 05/23/2020 CLINICAL DATA:  History of pulmonary embolism. Evaluate for lower extremity DVT. EXAM: BILATERAL LOWER EXTREMITY VENOUS DOPPLER ULTRASOUND TECHNIQUE: Gray-scale sonography with graded compression, as well as color Doppler and duplex ultrasound were performed to evaluate the lower extremity deep venous systems from the level of the common femoral vein and including the common femoral, femoral, profunda femoral, popliteal and calf veins including the posterior tibial, peroneal and gastrocnemius veins when visible. The superficial great saphenous vein was also interrogated. Spectral Doppler was utilized to evaluate flow at rest and with distal augmentation maneuvers in the common femoral, femoral and popliteal veins. COMPARISON:  12/22/2019 FINDINGS: RIGHT LOWER EXTREMITY Common Femoral Vein: No evidence of thrombus. Normal compressibility, respiratory phasicity and response to augmentation. Saphenofemoral Junction: No evidence of thrombus. Normal compressibility and flow on color Doppler imaging. Profunda Femoral Vein: No evidence of thrombus. Normal compressibility and flow on color Doppler imaging. Femoral Vein: No evidence of thrombus. Normal compressibility, respiratory phasicity and response to augmentation. Popliteal Vein: No  evidence of thrombus. Normal compressibility, respiratory phasicity and response to augmentation. Calf Veins: Visualized right deep calf veins are patent without thrombus. Limited evaluation. Superficial Great Saphenous Vein: No evidence of thrombus. Normal compressibility. Other Findings:  None. LEFT LOWER EXTREMITY Common Femoral Vein: No  evidence of thrombus. Normal compressibility, respiratory phasicity and response to augmentation. Saphenofemoral Junction: No evidence of thrombus. Normal compressibility and flow on color Doppler imaging. Profunda Femoral Vein: No evidence of thrombus. Normal compressibility and flow on color Doppler imaging. Femoral Vein: No evidence of thrombus. Normal compressibility, respiratory phasicity and response to augmentation. Popliteal Vein: No evidence of thrombus. Normal compressibility, respiratory phasicity and response to augmentation. Calf Veins: Visualized left deep calf veins are patent without thrombus. Limited evaluation. Superficial Great Saphenous Vein: No evidence of thrombus. Normal compressibility. Other Findings:  None. IMPRESSION: No evidence of deep venous thrombosis in either lower extremity. Electronically Signed   By: Markus Daft M.D.   On: 05/23/2020 12:21    Assessment:  79  year old female with multiple comorbidities including but not limited to history of severe pulmonary hypertension, heart failure, complete heart block s/p pacemaker placement, PE in March 2021,  presenting with acute respiratory failure, sepsis secondary to pneumonia,  found to have dark, heme positive stools after restarting Eliquis, which had been on hold for AV fistula creation. Acute blood loss anemia with Hgb historically in the 10/11 range and 9.1 on admission, drifting down this admission but without overt GI bleeding for 24 hours. Repeat CBC pending for today. I personally saw stool, which was brown.    Not optimized from a respiratory standpoint, remains on 10 liters O2 nasal cannula. COVID negative this admission. CXR yesterday with worsening right upper lobe pneumonia. Pulmonary on board with consultation yesterday evening.    With her multiple comorbidities, I will review with anesthesia her candidacy for anesthesia here at [facility]. I suspect she may not be a candidate. Regardless, will need to be  optimized from respiratory standpoint before diagnostic EGD; clinically stable currently from GI standpoint.    Plan: Follow CBC Continue to hold Eliquis Will advance to soft diet PPI BID Will continue to follow with you  Annitta Needs, PhD, ANP-BC [facility]    LOS: 3 days    05/25/2020, 9:20 AM  Attending note: Agree with above assessment and recommendations.  I have discussed with Dr. Christia Reading (anesthesia).  Patient is currently not an endoscopy candidate given her current clinical status.  Would need to see significant improvement in respiratory status prior to pursuing an EGD this admission. We will continue to follow with you.

## 2020-05-26 ENCOUNTER — Inpatient Hospital Stay (HOSPITAL_COMMUNITY): Payer: Medicare HMO

## 2020-05-26 LAB — BASIC METABOLIC PANEL
Anion gap: 12 (ref 5–15)
BUN: 65 mg/dL — ABNORMAL HIGH (ref 8–23)
CO2: 20 mmol/L — ABNORMAL LOW (ref 22–32)
Calcium: 8.6 mg/dL — ABNORMAL LOW (ref 8.9–10.3)
Chloride: 107 mmol/L (ref 98–111)
Creatinine, Ser: 4.15 mg/dL — ABNORMAL HIGH (ref 0.44–1.00)
GFR calc Af Amer: 11 mL/min — ABNORMAL LOW (ref >60)
GFR calc non Af Amer: 10 mL/min — ABNORMAL LOW (ref >60)
Glucose, Bld: 209 mg/dL — ABNORMAL HIGH (ref 70–99)
Potassium: 4.6 mmol/L (ref 3.5–5.1)
Sodium: 139 mmol/L (ref 135–145)

## 2020-05-26 LAB — CBC
HCT: 26.6 % — ABNORMAL LOW (ref 36.0–46.0)
Hemoglobin: 7.8 g/dL — ABNORMAL LOW (ref 12.0–15.0)
MCH: 28.6 pg (ref 26.0–34.0)
MCHC: 29.3 g/dL — ABNORMAL LOW (ref 30.0–36.0)
MCV: 97.4 fL (ref 80.0–100.0)
Platelets: 292 10*3/uL (ref 150–400)
RBC: 2.73 MIL/uL — ABNORMAL LOW (ref 3.87–5.11)
RDW: 14.8 % (ref 11.5–15.5)
WBC: 10.2 10*3/uL (ref 4.0–10.5)
nRBC: 0.2 % (ref 0.0–0.2)

## 2020-05-26 LAB — GLUCOSE, CAPILLARY
Glucose-Capillary: 195 mg/dL — ABNORMAL HIGH (ref 70–99)
Glucose-Capillary: 231 mg/dL — ABNORMAL HIGH (ref 70–99)
Glucose-Capillary: 255 mg/dL — ABNORMAL HIGH (ref 70–99)
Glucose-Capillary: 170 mg/dL — ABNORMAL HIGH (ref 70–99)

## 2020-05-26 LAB — LEGIONELLA PNEUMOPHILA SEROGP 1 UR AG: L. pneumophila Serogp 1 Ur Ag: NEGATIVE

## 2020-05-26 MED ORDER — FUROSEMIDE 40 MG PO TABS
40.0000 mg | ORAL_TABLET | Freq: Every day | ORAL | Status: DC
  Administered 2020-05-27 – 2020-05-28 (×2): 40 mg via ORAL
  Filled 2020-05-26 (×2): qty 1

## 2020-05-26 MED ORDER — AZITHROMYCIN 250 MG PO TABS
500.0000 mg | ORAL_TABLET | Freq: Every day | ORAL | Status: DC
  Administered 2020-05-27 – 2020-05-28 (×2): 500 mg via ORAL
  Filled 2020-05-26 (×2): qty 2

## 2020-05-26 MED ORDER — PANTOPRAZOLE SODIUM 40 MG PO TBEC
40.0000 mg | DELAYED_RELEASE_TABLET | Freq: Two times a day (BID) | ORAL | Status: DC
  Administered 2020-05-26 – 2020-05-28 (×5): 40 mg via ORAL
  Filled 2020-05-26 (×5): qty 1

## 2020-05-26 MED ORDER — GLIPIZIDE 5 MG PO TABS
5.0000 mg | ORAL_TABLET | Freq: Two times a day (BID) | ORAL | Status: DC
  Administered 2020-05-26 – 2020-05-28 (×4): 5 mg via ORAL
  Filled 2020-05-26 (×4): qty 1

## 2020-05-26 MED ORDER — SODIUM CHLORIDE 0.9 % IV SOLN: 500.0000 mg | INTRAVENOUS | Status: DC

## 2020-05-26 NOTE — Progress Notes (Signed)
Spoke with patient's RN about Sats.  Using my pulse ox, patient's O2 sat was around 88 to 89 on 3L.  Patient was also taking [place] out of nose and placing it under her mouth.  Patient stated day shift was putting her [place] there.  Explained to RN about patient's statement.  Gave patient her breathing treatment.  Will continue to monitor patient.

## 2020-05-26 NOTE — Progress Notes (Signed)
Name: Cassandra Parker MRN: [id number] DOB: [birth year]    ADMISSION DATE:  05/22/2020 CONSULTATION DATE:  05/26/2020   REFERRING MD :  [facility]ourage, [facility]   CHIEF COMPLAINT:  resp distress, hypoxia  BRIEF PATIENT DESCRIPTION: 79 year old diabetic with advanced CKD underwent right AV fistula placement 8/24 under MAC anesthesia and presented 8/28 with hypoxia and right upper lobe pneumonia with melena  PMH - CKDIV, s/pright mastectomy and  axillary node dissection HTN, DM2, HLD,History of Complete Heart Block s/p Pacemaker placement, HFpEF, chronic Anemia and h/o PE (11/2019) on Eliquis   SIGNIFICANT EVENTS  8/24 right AV fistula creation under MAC anesthesia 8/28 ED evaluation showed right upper lobe pneumonia, leukocytosis and heme positive stools with hemoglobin 9.1 baseline close to 11. Eliquis was stopped, she was treated with Rocephin and azithromycin, Lasix was continued , aspiration is suspected.  STUDIES:  Echo 8/29 normal LVEF, moderate LVH, grade 1 diastolic dysfunction, RVSP 64 Echo 11/2019 mildly elevated RVSP 11/2019 intermediate VQ scan 11/2019 venous duplex negative  8/29 venous duplex negative     SUBJECTIVE:  Sitting up having breakfast Afebrile Oxygen down to 5 L  VITAL SIGNS: Temp:  [98.1 F (36.7 C)-99.1 F (37.3 C)] 99.1 F (37.3 C) (09/01 0518) Pulse Rate:  [83-87] 87 (09/01 0518) Resp:  [22] 22 (09/01 0518) BP: (151-154)/(55-63) 154/63 (09/01 0518) SpO2:  [92 %-100 %] 94 % (09/01 1247) Weight:  [107.7 kg] 107.7 kg (09/01 0518)  PHYSICAL EXAMINATION: Gen. Pleasant, obese, in no distress ENT - no lesions, no post nasal drip Neck: No JVD, no thyromegaly, no carotid bruits Lungs: no use of accessory muscles, no dullness to percussion, decreased without rales or rhonchi  Cardiovascular: Rhythm regular, heart sounds  normal, no murmurs or gallops, 2+ peripheral edema Musculoskeletal: No deformities, no cyanosis or clubbing , no tremors    Chest  x-ray 9/1 independently reviewed which shows persistent right upper lobe consolidation no change from 8/28  Recent Labs  Lab 05/23/20 0735 05/24/20 0622 05/26/20 0558  NA 140 141 139  K 4.2 4.4 4.6  CL 111 111 107  CO2 21* 22 20*  BUN 75* 68* 65*  CREATININE 3.59* 3.57* 4.15*  GLUCOSE 149* 134* 209*   Recent Labs  Lab 05/24/20 0622 05/25/20 0959 05/26/20 0558  HGB 8.2* 8.4* 7.8*  HCT 27.2* 28.0* 26.6*  WBC 10.5 9.5 10.2  PLT 267 291 292   DG Chest 2 View  Result Date: 05/26/2020 CLINICAL DATA:  SOB with cough. EXAM: CHEST - 2 VIEW COMPARISON:  05/24/2020 FINDINGS: Left chest wall pacer is noted with leads in the right atrial appendage, right ventricle and coronary sinus. Stable cardiomediastinal contours. Extensive airspace disease within the right upper lobe is again noted and appears unchanged. Left lung is clear. No pleural effusion or edema identified. IMPRESSION: 1. No change in right upper lobe pneumonia. Electronically Signed   By: Kerby Moors M.D.   On: 05/26/2020 10:40    ASSESSMENT / PLAN:  Acute hypoxic respiratory failure , oxygen needs decreasing Right upper lobe consolidation, suspected aspiration pneumonia, recent MAC anesthesia No prior history of dysphagia -Okay to proceed with EGD once oxygen requirements are minimal   Severe pulmonary hypertension -this is new compared to echo from 11/2019 and at least in part related to acute hypoxia .  Likely secondary to diastolic dysfunction and hypoxia, mitral stenosis is mild, no evidence of acute pulmonary edema Doubt CTEPH is a consideration  UGI bleed -Eliquis held, EGD on hold due to  hypoxia AKI on CKD 4 -near end-stage, some response to Lasix but creatinine rising  Outpatient follow-up in 3 months and we will reassess pulmonary hypertension with echo PCCM available as needed  Kara Mead MD. FCCP. [facility]  If no response to pager , please call [phone]  After 7:00 pm call  Elink  [phone]   05/26/2020    05/26/2020, 2:31 PM

## 2020-05-26 NOTE — Progress Notes (Addendum)
Patient Demographics:    Cassandra Parker, is a 79 y.o. female, DOB - 1941-01-21, AQT:[id number]  Admit date - 05/22/2020   Admitting Physician Nyhla Mountjoy Denton Brick, MD  Outpatient Primary MD for the patient is [name], [name], MD  LOS - 4  Chief Complaint  Patient presents with  . Shortness of Breath        Subjective:    [name] today has no fevers, no emesis,  No chest pain,    -Patient sitting up in the chair, daughter at bedside,  cough and shortness of breath improving  05/26/20-O2 sats 92% on 5 L at rest  -Dyspnea on exertion persist  Had light brown stool   Assessment  & Plan :    Principal Problem:   Sepsis due to PNA Active Problems:   Cardiomyopathy- EF 35-40% (etiology not yet determined)   Respiratory failure with hypoxia (HCC)   Rt U L Pneumonia   GI bleed   HTN (hypertension)   Diabetes mellitus type 2, controlled, without complications (HCC)   Cancer of breast- s/p chemo and Rt mastectomy 2008   Mitral stenosis- moderate with trivial MR    Severe Pulmonary HTN--  Brief Summary:- 79 y.o. female with CKDIV, s/p right mastectomy and presumably axillary node dissection, s/p pacemaker on the left side, s/p  AV fistula creation on 05/18/2020, HTN, DM2, HLD, History of Complete Heart Block s/p Pacemaker placement, HFpEF, chronic Anemia and h/o PE (11/2019) on Eliquis admitted with acute hypoxic respiratory failure secondary to pneumonia with echo showing severe pulmonary hypertension --O2 sats 88 to 90% on 10 L at rest  A/p 1)Sepsis Secondary to Pneumonia---- --most likely aspiration related post anesthesia Patient met sepsis criteria on admission with finding of right-sided pneumonia, leukocytosis,  tachypnea, and hypoxia -No tachycardia most likely due to carvedilol use -Lactic acid was not elevated -WBC 11.9 >> 10.5 >>9.5>>10.2 Repeat CXR on 05/26/20 with persistent  right-sided pneumonia -Continue IV Rocephin and azithromycin, mucolytics and supplemental oxygen as ordered 05/26/20-O2 sats 92% on 5 L at rest  2) GI bleed--- patient with dark stools after restarting Eliquis, --Hgb 10.5 >> 9.1 >>8.0 >>8.6>>8.2 >>8.4>>7.8 ( baseline that is usually close to 11) -c/n IV Protonix -- stool for occult blood -- Positive -Transfuse as clinically indicated -Stopped Eliquis -GI consult from Dr. Gala Romney appreciated for possible endoluminal evaluation -Respiratory status precludes endoluminal evaluation/sedation at this time -H&H appears to have stabilized after stopping Eliquis And  after initial drop -Had light brown stool on 05/26/2020  3)HFpEF/chronic diastolic dysfunction CHF/pulmonary hypertension--elevated BNP noted, -Echo from 05/23/2020 with EF of 60 to 65% with severe pulmonary hypertension and grade 1 diastolic dysfunction -PCCM consult requested for severe pulmonary hypertension -Troponin is 114>> 117--- this is consistent with patient prior baseline --- bnp 1,462 no baseline available --Chest x-ray without overt failure, hypoxia most likely from #1 above -c/n  Coreg -Developed AKI on CKD with IV Lasix, give diuretic holiday on 05/26/2020 resume p.o. Lasix on 05/27/2020 -Chest x-ray on 05/26/2020 without fluid overload  4)Acute hypoxic respiratory failure/severe pulmonary hypertension --- likely secondary to pneumonia as above #1 --She is found to be hypoxic with O2 sats of 68 % on room air, currently on 6 L of oxygen---  --previously no hypoxia  and PTA patient was not on home O2 --- Manage as above #1 -PCCM consult appreciated --Anticipate patient will probably be discharged home on home O2  5)RUL PNA-- CAP--- suspect aspiration related, chest x-ray suggestive of right upper lobe pneumonia in the setting of recent anesthesia/sedation for AV fistula creation on 05/18/2020 raises concern about possible aspiration -Repeat CXR on 05/26/20 with persistent right  upper lobe pneumonia --Manage as above #1  6)H/o PE--diagnosed in March 2021 with VQ scan that was intermediate in the setting of elevated D-dimer -Patient has severe pulmonary hypertension-PCCM consult requested for severe pulmonary hypertension -Patient was unable to do CTA chest at that time due to kidney concerns -He has been compliant with Eliquis except for  holding her Eliquis from 05/14/2020 through 07/18/2020 inclusive to allow for right upper extremity AV fistula creation, she restarted Eliquis on 05/19/2020 --- Given dark stools and drop in H&H we will hold Eliquis  --lower extremity venous Dopplers--without acute DVT  7)History of Complete Heart Block s/p Pacemaker placement -EKG shows paced rhythm. Stable. Routine follow-up as outpatient.  8)AKI on CKD IV--creatinine up to 4.15 from 3.5 after IV Lasix was given on 05/25/2020  -Hold further IV Lasix, may restart oral Lasix on 05/27/2020 --Status post AV fistula creation on 05/18/2020 in anticipation of HD in the near term -- renally adjust medications, avoid nephrotoxic agents / dehydration  / hypotension   9)Diabetes mellitus type 2 -Blood sugars running higher okay to restart glipizide as patient is tolerating oral intake -Sliding scale insulin coverage with fingerstick monitoring   10)History of Rt Breast cancer -S/p chemo and Rt mastectomy 2008 Status post right-sided mastectomy and chemotherapy -Stable, follow-up as outpatient  11)Acute on chronicAnemia--at baseline patient has anemia of CKD -Suspect worsening anemia with hemoglobin  --Hgb 10.5 >> 9.1 >>8.0 >>8.6>>8.2>.8.4>.7.8 --baseline close to 11 is due to acute GI bleed/ABLA -IV Protonix as above #2, GI consult noted  code Status:full  Family Communication: (patient is alert, awake and coherent) Previously discussed with daughter   Disposition/Need for in-Hospital Stay- patient unable to be discharged at this time due to --Sepsis treatment for  pneumonia requiring Iv antibiotics and Gi Bleed requiring further endoluminal evaluation --Anticipate patient will probably be discharged home on home O2  Status is: Inpatient  Remains inpatient appropriate because:Sepsis treatment for pneumonia requiring Iv antibiotics and Gi Bleed requiring further endoluminal evaluation -Anticipate patient will probably be discharged home on home O2  Disposition: The patient is from: Home              Anticipated d/c is to: Home              Anticipated d/c date is: 2 days              Patient currently is not medically stable to d/c. Barriers: Not Clinically Stable- Sepsis treatment for pneumonia requiring Iv antibiotics and Gi Bleed requiring further endoluminal evaluation -Anticipate patient will probably be discharged home on home O2 Consults  :  Gi  DVT Prophylaxis  :    - SCDs (Gi bleed)  Lab Results  Component Value Date   PLT 292 05/26/2020    Inpatient Medications  Scheduled Meds: . amLODipine  10 mg Oral Daily  . carvedilol  3.125 mg Oral BID  . cholecalciferol  2,000 Units Oral Daily  . [START ON 05/27/2020] furosemide  40 mg Oral Daily  . guaiFENesin  600 mg Oral BID  . hydrALAZINE  50 mg Oral BID  .  insulin aspart  0-5 Units Subcutaneous QHS  . insulin aspart  0-9 Units Subcutaneous TID WC  . ipratropium-albuterol  3 mL Nebulization BID  . multivitamin with minerals  1 tablet Oral Daily  . pantoprazole  40 mg Oral BID AC  . pravastatin  10 mg Oral QHS  . sodium chloride flush  3 mL Intravenous Q12H  . vitamin B-12  1,000 mcg Oral Daily   Continuous Infusions: . sodium chloride    . azithromycin 500 mg (05/26/20 1105)  . cefTRIAXone (ROCEPHIN)  IV Stopped (05/26/20 1030)   PRN Meds:.sodium chloride, acetaminophen **OR** acetaminophen, albuterol, HYDROcodone-acetaminophen, labetalol, ondansetron **OR** ondansetron (ZOFRAN) IV, polyethylene glycol, sodium chloride flush, traZODone    Anti-infectives (From admission,  onward)   Start     Dose/Rate Route Frequency Ordered Stop   05/23/20 1000  cefTRIAXone (ROCEPHIN) 2 g in sodium chloride 0.9 % 100 mL IVPB        2 g 200 mL/hr over 30 Minutes Intravenous Every 24 hours 05/22/20 1640     05/23/20 1000  azithromycin (ZITHROMAX) 500 mg in sodium chloride 0.9 % 250 mL IVPB        500 mg 250 mL/hr over 60 Minutes Intravenous Every 24 hours 05/22/20 1640     05/22/20 1130  cefTRIAXone (ROCEPHIN) 1 g in sodium chloride 0.9 % 100 mL IVPB        1 g 200 mL/hr over 30 Minutes Intravenous  Once 05/22/20 1129 05/22/20 1230   05/22/20 1130  azithromycin (ZITHROMAX) 500 mg in sodium chloride 0.9 % 250 mL IVPB        500 mg 250 mL/hr over 60 Minutes Intravenous  Once 05/22/20 1129 05/22/20 1346        Objective:   Vitals:   05/25/20 2059 05/25/20 2114 05/26/20 0518 05/26/20 0745  BP: (!) 151/55  (!) 154/63   Pulse: 83  87   Resp: (!) 22  (!) 22   Temp: 98.1 F (36.7 C)  99.1 F (37.3 C)   TempSrc: Oral     SpO2: 97% 94% 96% 92%  Weight:   107.7 kg   Height:        Wt Readings from Last 3 Encounters:  05/26/20 107.7 kg  05/18/20 103.4 kg  05/14/20 103.7 kg     Intake/Output Summary (Last 24 hours) at 05/26/2020 1228 Last data filed at 05/26/2020 1030 Gross per 24 hour  Intake 359.37 ml  Output 150 ml  Net 209.37 ml   Physical Exam  Gen:- Awake Alert,  morbidly obese HEENT:- [place].AT, No sclera icterus Nose- [place] 5L/min Neck-Supple Neck ,. +ve JVD Lungs-diminished in bases, scattered rhonchi on the Rt CV- S1, S2 normal, regular  Abd-  +ve B.Sounds, Abd Soft, increased truncal adiposity Extremity/Skin:-Bilateral lower extremity edema right more than left, pedal pulses present Psych-affect is appropriate, oriented x3 Neuro-generalized weakness ,no new focal deficits, no tremors   Data Review:   Micro Results Recent Results (from the past 240 hour(s))  SARS CORONAVIRUS 2 (TAT 6-24 HRS) Nasopharyngeal Nasopharyngeal Swab     Status: None    Collection Time: 05/17/20  9:53 AM   Specimen: Nasopharyngeal Swab  Result Value Ref Range Status   SARS Coronavirus 2 NEGATIVE NEGATIVE Final    Comment: (NOTE) SARS-CoV-2 target nucleic acids are NOT DETECTED.  The SARS-CoV-2 RNA is generally detectable in upper and lower respiratory specimens during the acute phase of infection. Negative results do not preclude SARS-CoV-2 infection, do not rule out co-infections  with other pathogens, and should not be used as the sole basis for treatment or other patient management decisions. Negative results must be combined with clinical observations, patient history, and epidemiological information. The expected result is Negative.  Fact Sheet for Patients: SugarRoll.be  Fact Sheet for Healthcare Providers: https://www.woods-mathews.com/  This test is not yet approved or cleared by the [place] FDA and  has been authorized for detection and/or diagnosis of SARS-CoV-2 by FDA under an Emergency Use Authorization (EUA). This EUA will remain  in effect (meaning this test can be used) for the duration of the COVID-19 declaration under Se ction 564(b)(1) of the Act, 21 U.S.C. section 360bbb-3(b)(1), unless the authorization is terminated or revoked sooner.  Performed at [facility], [place] [address], [place], [place] [address]   Culture, blood (Routine X 2) w Reflex to ID Panel     Status: None (Preliminary result)   Collection Time: 05/22/20 11:17 AM   Specimen: Left Antecubital; Blood  Result Value Ref Range Status   Specimen Description LEFT ANTECUBITAL  Final   Special Requests   Final    BOTTLES DRAWN AEROBIC AND ANAEROBIC Blood Culture adequate volume   Culture   Final    NO GROWTH 3 DAYS Performed at [facility], [address], [place], [place] [address]    Report Status PENDING  Incomplete  SARS Coronavirus 2 by RT PCR (hospital order, performed in [place] hospital lab)  Nasopharyngeal Nasopharyngeal Swab     Status: None   Collection Time: 05/22/20 12:01 PM   Specimen: Nasopharyngeal Swab  Result Value Ref Range Status   SARS Coronavirus 2 NEGATIVE NEGATIVE Final    Comment: (NOTE) SARS-CoV-2 target nucleic acids are NOT DETECTED.  The SARS-CoV-2 RNA is generally detectable in upper and lower respiratory specimens during the acute phase of infection. The lowest concentration of SARS-CoV-2 viral copies this assay can detect is 250 copies / mL. A negative result does not preclude SARS-CoV-2 infection and should not be used as the sole basis for treatment or other patient management decisions.  A negative result may occur with improper specimen collection / handling, submission of specimen other than nasopharyngeal swab, presence of viral mutation(s) within the areas targeted by this assay, and inadequate number of viral copies (<250 copies / mL). A negative result must be combined with clinical observations, patient history, and epidemiological information.  Fact Sheet for Patients:   StrictlyIdeas.no  Fact Sheet for Healthcare Providers: BankingDealers.co.za  This test is not yet approved or  cleared by the [place] FDA and has been authorized for detection and/or diagnosis of SARS-CoV-2 by FDA under an Emergency Use Authorization (EUA).  This EUA will remain in effect (meaning this test can be used) for the duration of the COVID-19 declaration under Section 564(b)(1) of the Act, 21 U.S.C. section 360bbb-3(b)(1), unless the authorization is terminated or revoked sooner.  Performed at [facility], [address] [place], [place] [address]   Culture, blood (Routine X 2) w Reflex to ID Panel     Status: None (Preliminary result)   Collection Time: 05/22/20  6:40 PM   Specimen: BLOOD  Result Value Ref Range Status   Specimen Description BLOOD BLOOD LEFT ARM  Final   Special Requests   Final     BOTTLES DRAWN AEROBIC AND ANAEROBIC Blood Culture results may not be optimal due to an inadequate volume of blood received in culture bottles   Culture   Final    NO  GROWTH 3 DAYS Performed at [facility], [address] [place], [place] [address]    Report Status PENDING  Incomplete    Radiology Reports DG Chest 2 View  Result Date: 05/26/2020 CLINICAL DATA:  SOB with cough. EXAM: CHEST - 2 VIEW COMPARISON:  05/24/2020 FINDINGS: Left chest wall pacer is noted with leads in the right atrial appendage, right ventricle and coronary sinus. Stable cardiomediastinal contours. Extensive airspace disease within the right upper lobe is again noted and appears unchanged. Left lung is clear. No pleural effusion or edema identified. IMPRESSION: 1. No change in right upper lobe pneumonia. Electronically Signed   By: Kerby Moors M.D.   On: 05/26/2020 10:40   DG Chest 2 View  Result Date: 05/24/2020 CLINICAL DATA:  Shortness of breath. EXAM: CHEST - 2 VIEW COMPARISON:  May 22, 2020. FINDINGS: Stable cardiomegaly. No pneumothorax or pleural effusion is noted. Left-sided pacemaker is unchanged in position. Left lung is clear. Increased right upper lobe airspace opacity is noted concerning for worsening pneumonia. Bony thorax is unremarkable. IMPRESSION: Worsening right upper lobe pneumonia. Electronically Signed   By: Marijo Conception M.D.   On: 05/24/2020 10:00   US Venous Img Lower Bilateral (DVT)  Result Date: 05/23/2020 CLINICAL DATA:  History of pulmonary embolism. Evaluate for lower extremity DVT. EXAM: BILATERAL LOWER EXTREMITY VENOUS DOPPLER ULTRASOUND TECHNIQUE: Gray-scale sonography with graded compression, as well as color Doppler and duplex ultrasound were performed to evaluate the lower extremity deep venous systems from the level of the common femoral vein and including the common femoral, femoral, profunda femoral, popliteal and calf veins including the posterior tibial, peroneal and  gastrocnemius veins when visible. The superficial great saphenous vein was also interrogated. Spectral Doppler was utilized to evaluate flow at rest and with distal augmentation maneuvers in the common femoral, femoral and popliteal veins. COMPARISON:  12/22/2019 FINDINGS: RIGHT LOWER EXTREMITY Common Femoral Vein: No evidence of thrombus. Normal compressibility, respiratory phasicity and response to augmentation. Saphenofemoral Junction: No evidence of thrombus. Normal compressibility and flow on color Doppler imaging. Profunda Femoral Vein: No evidence of thrombus. Normal compressibility and flow on color Doppler imaging. Femoral Vein: No evidence of thrombus. Normal compressibility, respiratory phasicity and response to augmentation. Popliteal Vein: No evidence of thrombus. Normal compressibility, respiratory phasicity and response to augmentation. Calf Veins: Visualized right deep calf veins are patent without thrombus. Limited evaluation. Superficial Great Saphenous Vein: No evidence of thrombus. Normal compressibility. Other Findings:  None. LEFT LOWER EXTREMITY Common Femoral Vein: No evidence of thrombus. Normal compressibility, respiratory phasicity and response to augmentation. Saphenofemoral Junction: No evidence of thrombus. Normal compressibility and flow on color Doppler imaging. Profunda Femoral Vein: No evidence of thrombus. Normal compressibility and flow on color Doppler imaging. Femoral Vein: No evidence of thrombus. Normal compressibility, respiratory phasicity and response to augmentation. Popliteal Vein: No evidence of thrombus. Normal compressibility, respiratory phasicity and response to augmentation. Calf Veins: Visualized left deep calf veins are patent without thrombus. Limited evaluation. Superficial Great Saphenous Vein: No evidence of thrombus. Normal compressibility. Other Findings:  None. IMPRESSION: No evidence of deep venous thrombosis in either lower extremity. Electronically  Signed   By: Markus Daft M.D.   On: 05/23/2020 12:21   DG Chest Portable 1 View  Result Date: 05/22/2020 CLINICAL DATA:  Shortness of breath, hypoxemia, end-stage renal disease on dialysis, LEFT-sided crackles, RIGHT-side week EXAM: PORTABLE CHEST 1 VIEW COMPARISON:  Portable exam 1042 hours compared to 12/21/2019 FINDINGS: LEFT subclavian sequential pacemaker with leads projecting  over RIGHT atrium, RIGHT ventricle, and coronary sinus. Enlargement of cardiac silhouette with vascular congestion. Minimal LEFT base atelectasis. RIGHT perihilar opacities extending into RIGHT upper lobe favor pneumonia although requiring follow-up until resolution to exclude underlying nodule. Question mild enlargement of RIGHT hilum versus perihilar infiltrate. Remaining lungs clear. No pleural effusion or pneumothorax. Bones demineralized. IMPRESSION: Enlargement of cardiac silhouette post pacemaker. Questionable RIGHT hilar enlargement/adenopathy versus perihilar infiltrate. RIGHT upper lobe pneumonia; follow-up exams until resolution recommended to exclude underlying pulmonary mass/nodule and hilar adenopathy. Electronically Signed   By: Lavonia Dana M.D.   On: 05/22/2020 11:16   VAS [place] UPPER EXTREMITY ARTERIAL DUPLEX  Result Date: 04/29/2020 UPPER EXTREMITY DUPLEX STUDY Indications: Patient complains of HD access pre op.  Performing Technologist: Ralene Cork RVT  Examination Guidelines: A complete evaluation includes B-mode imaging, spectral Doppler, color Doppler, and power Doppler as needed of all accessible portions of each vessel. Bilateral testing is considered an integral part of a complete examination. Limited examinations for reoccurring indications may be performed as noted.  Right Pre-Dialysis Findings: +-----------------------+----------+--------------------+---------+--------+ Location               PSV (cm/s)Intralum. Diam. (cm)Waveform Comments  +-----------------------+----------+--------------------+---------+--------+ Brachial Antecub. fossa98        0.50                triphasic         +-----------------------+----------+--------------------+---------+--------+ Radial Art at Wrist    76        0.19                triphasic         +-----------------------+----------+--------------------+---------+--------+ Ulnar Art at Wrist     66        0.16                triphasic         +-----------------------+----------+--------------------+---------+--------+ Left Pre-Dialysis Findings: +-----------------------+----------+--------------------+---------+--------+ Location               PSV (cm/s)Intralum. Diam. (cm)Waveform Comments +-----------------------+----------+--------------------+---------+--------+ Brachial Antecub. fossa81        0.52                triphasic         +-----------------------+----------+--------------------+---------+--------+ Radial Art at Wrist    58        0.21                triphasic         +-----------------------+----------+--------------------+---------+--------+ Ulnar Art at Wrist     61        0.15                triphasic         +-----------------------+----------+--------------------+---------+--------+  Summary:  Right: Patent brachial, radial, and ulnar arteries with calcified        walls in the radial and ulnar arteries. Left: Patent brachial, radial, and ulnar arteries with calcified       walls in the radial and ulnar arteries. *See table(s) above for measurements and observations. Electronically signed by Ruta Hinds MD on 04/29/2020 at 11:57:00 AM.    Final    ECHOCARDIOGRAM COMPLETE  Result Date: 05/23/2020    ECHOCARDIOGRAM REPORT   Patient Name:   GARNETTA FEDRICK Date of Exam: 05/23/2020 Medical Rec #:  [id number]          Height:       63.0 in Accession #:    [id number]  Weight:       233.0 lb Date of Birth:  November 01, 1940         BSA:          2.063 m  Patient Age:    50 years           BP:           148/63 mmHg Patient Gender: F                  HR:           83 bpm. Exam Location:  [facility] Procedure: 2D Echo, Cardiac Doppler and Color Doppler Indications:    Dyspnea  History:        Patient has prior history of Echocardiogram examinations, most                 recent 12/23/2019. CHF, Pacemaker, Mitral Valve Disease,                 Signs/Symptoms:Shortness of Breath; Risk Factors:Diabetes and                 Hypertension. Sepsis, pneumonia.  Sonographer:    Dustin Flock RDCS Referring Phys: [place]  1. Left ventricular ejection fraction, by estimation, is 60 to 65%. The left ventricle has normal function. The left ventricle has no regional wall motion abnormalities. There is moderate left ventricular hypertrophy. Left ventricular diastolic parameters are consistent with Grade I diastolic dysfunction (impaired relaxation).  2. Right ventricular systolic function is normal. The right ventricular size is normal. There is severely elevated pulmonary artery systolic pressure. The estimated right ventricular systolic pressure is 40.9 mmHg. Device wire present.  3. Left atrial size was mildly dilated.  4. The mitral valve is abnormal, mildly calcified and with moderate annular calcification. Mild mitral valve regurgitation.  5. The aortic valve is tricuspid. Aortic valve regurgitation is not visualized.  6. The inferior vena cava is dilated in size with <50% respiratory variability, suggesting right atrial pressure of 15 mmHg. FINDINGS  Left Ventricle: Left ventricular ejection fraction, by estimation, is 60 to 65%. The left ventricle has normal function. The left ventricle has no regional wall motion abnormalities. The left ventricular internal cavity size was normal in size. There is  moderate left ventricular hypertrophy. Left ventricular diastolic parameters are consistent with Grade I diastolic dysfunction (impaired relaxation).  Right Ventricle: The right ventricular size is normal. No increase in right ventricular wall thickness. Right ventricular systolic function is normal. There is severely elevated pulmonary artery systolic pressure. The tricuspid regurgitant velocity is 3.49 m/s, and with an assumed right atrial pressure of 15 mmHg, the estimated right ventricular systolic pressure is 81.1 mmHg. Left Atrium: Left atrial size was mildly dilated. Right Atrium: Right atrial size was normal in size. Pericardium: There is no evidence of pericardial effusion. Mitral Valve: The mitral valve is abnormal. There is mild calcification of the mitral valve leaflet(s). Moderate mitral annular calcification. Mild mitral valve regurgitation. Tricuspid Valve: The tricuspid valve is grossly normal. Tricuspid valve regurgitation is mild. Aortic Valve: The aortic valve is tricuspid. Aortic valve regurgitation is not visualized. Mild aortic valve annular calcification. Pulmonic Valve: The pulmonic valve was grossly normal. Pulmonic valve regurgitation is mild. Aorta: The aortic root is normal in size and structure. Venous: The inferior vena cava is dilated in size with less than 50% respiratory variability, suggesting right atrial pressure of 15 mmHg. IAS/Shunts: No atrial level shunt detected by color flow  Doppler. Additional Comments: A pacer wire is visualized.  LEFT VENTRICLE PLAX 2D LVIDd:         4.24 cm  Diastology LVIDs:         3.01 cm  LV e' lateral:   3.59 cm/s LV PW:         1.50 cm  LV E/e' lateral: 33.4 LV IVS:        1.52 cm  LV e' medial:    5.44 cm/s LVOT diam:     2.00 cm  LV E/e' medial:  22.1 LV SV:         93 LV SV Index:   45 LVOT Area:     3.14 cm  RIGHT VENTRICLE RV Basal diam:  3.78 cm RV S prime:     9.90 cm/s TAPSE (M-mode): 3.2 cm LEFT ATRIUM             Index       RIGHT ATRIUM           Index LA diam:        4.30 cm 2.08 cm/m  RA Area:     21.90 cm LA Vol (A2C):   52.2 ml 25.30 ml/m RA Volume:   67.60 ml  32.76 ml/m LA  Vol (A4C):   74.4 ml 36.06 ml/m LA Biplane Vol: 69.1 ml 33.49 ml/m  AORTIC VALVE LVOT Vmax:   130.00 cm/s LVOT Vmean:  96.800 cm/s LVOT VTI:    0.295 m  AORTA Ao Root diam: 3.00 cm MITRAL VALVE                TRICUSPID VALVE MV Area (PHT): 3.28 cm     TR Peak grad:   48.7 mmHg MV Decel Time: 231 msec     TR Vmax:        349.00 cm/s MV E velocity: 120.00 cm/s MV A velocity: 125.00 cm/s  SHUNTS MV E/A ratio:  0.96         Systemic VTI:  0.30 m                             Systemic Diam: 2.00 cm Rozann Lesches MD Electronically signed by Rozann Lesches MD Signature Date/Time: 05/23/2020/11:07:51 AM    Final    VAS [place] UPPER EXTREMITY VEIN MAPPING  Result Date: 04/29/2020 UPPER EXTREMITY VEIN MAPPING  Indications: Pre-access. Performing Technologist: Ralene Cork RVT  Examination Guidelines: A complete evaluation includes B-mode imaging, spectral Doppler, color Doppler, and power Doppler as needed of all accessible portions of each vessel. Bilateral testing is considered an integral part of a complete examination. Limited examinations for reoccurring indications may be performed as noted. +-----------------+-------------+----------+--------+ Right Cephalic   Diameter (cm)Depth (cm)Findings +-----------------+-------------+----------+--------+ Shoulder             0.55                        +-----------------+-------------+----------+--------+ Prox upper arm       0.53                        +-----------------+-------------+----------+--------+ Mid upper arm        0.48                        +-----------------+-------------+----------+--------+ Dist upper arm       0.47                        +-----------------+-------------+----------+--------+  Antecubital fossa    0.66                        +-----------------+-------------+----------+--------+ Prox forearm         0.34                        +-----------------+-------------+----------+--------+ Mid forearm          0.30                         +-----------------+-------------+----------+--------+ Dist forearm         0.24               lateral  +-----------------+-------------+----------+--------+ +-----------------+-------------+----------+--------+ Right Basilic    Diameter (cm)Depth (cm)Findings +-----------------+-------------+----------+--------+ Mid upper arm        0.43                        +-----------------+-------------+----------+--------+ Dist upper arm       0.52                        +-----------------+-------------+----------+--------+ Antecubital fossa    0.28                        +-----------------+-------------+----------+--------+ +-----------------+-------------+----------+--------------+ Left Cephalic    Diameter (cm)Depth (cm)   Findings    +-----------------+-------------+----------+--------------+ Shoulder             0.28                              +-----------------+-------------+----------+--------------+ Prox upper arm       0.18                              +-----------------+-------------+----------+--------------+ Mid upper arm        0.18                              +-----------------+-------------+----------+--------------+ Dist upper arm       0.22                              +-----------------+-------------+----------+--------------+ Antecubital fossa    0.25                              +-----------------+-------------+----------+--------------+ Prox forearm         0.16                              +-----------------+-------------+----------+--------------+ Mid forearm          0.12                              +-----------------+-------------+----------+--------------+ Dist forearm                            not visualized +-----------------+-------------+----------+--------------+ +-----------------+-------------+----------+--------+ Left Basilic     Diameter (cm)Depth (cm)Findings  +-----------------+-------------+----------+--------+ Mid upper arm        0.38                        +-----------------+-------------+----------+--------+  Dist upper arm       0.48                        +-----------------+-------------+----------+--------+ Antecubital fossa    0.25                        +-----------------+-------------+----------+--------+ Summary: Right: Patent cephalic and basilic veins. Left: Patent cephalic and basilic veins. *See table(s) above for measurements and observations.  Diagnosing physician: Ruta Hinds MD Electronically signed by Ruta Hinds MD on 04/29/2020 at 11:57:08 AM.    Final      CBC Recent Labs  Lab 05/22/20 1115 05/22/20 1115 05/22/20 2129 05/23/20 0735 05/24/20 0622 05/25/20 0959 05/26/20 0558  WBC 11.9*  --   --  10.6* 10.5 9.5 10.2  HGB 9.1*   < > 8.0* 8.6* 8.2* 8.4* 7.8*  HCT 30.1*   < > 26.8* 29.3* 27.2* 28.0* 26.6*  PLT 247  --   --  264 267 291 292  MCV 96.5  --   --  97.3 98.2 98.2 97.4  MCH 29.2  --   --  28.6 29.6 29.5 28.6  MCHC 30.2  --   --  29.4* 30.1 30.0 29.3*  RDW 14.9  --   --  14.8 14.8 14.7 14.8  LYMPHSABS 1.7  --   --   --   --   --   --   MONOABS 0.9  --   --   --   --   --   --   EOSABS 0.0  --   --   --   --   --   --   BASOSABS 0.1  --   --   --   --   --   --    < > = values in this interval not displayed.    Chemistries  Recent Labs  Lab 05/22/20 1115 05/23/20 0735 05/24/20 0622 05/26/20 0558  NA 138 140 141 139  K 4.2 4.2 4.4 4.6  CL 109 111 111 107  CO2 21* 21* 22 20*  GLUCOSE 250* 149* 134* 209*  BUN 73* 75* 68* 65*  CREATININE 3.56* 3.59* 3.57* 4.15*  CALCIUM 8.5* 8.5* 8.3* 8.6*  AST 46*  --   --   --   ALT 34  --   --   --   ALKPHOS 67  --   --   --   BILITOT 0.6  --   --   --    ------------------------------------------------------------------------------------------------------------------ No results for input(s): CHOL, HDL, LDLCALC, TRIG, CHOLHDL, LDLDIRECT in the  last 72 hours.  Lab Results  Component Value Date   HGBA1C 7.4 (H) 05/22/2020   ------------------------------------------------------------------------------------------------------------------ No results for input(s): TSH, T4TOTAL, T3FREE, THYROIDAB in the last 72 hours.  Invalid input(s): FREET3 ------------------------------------------------------------------------------------------------------------------ No results for input(s): VITAMINB12, FOLATE, FERRITIN, TIBC, IRON, RETICCTPCT in the last 72 hours.  Coagulation profile Recent Labs  Lab 05/22/20 1115  INR 1.9*   No results for input(s): DDIMER in the last 72 hours.  Cardiac Enzymes No results for input(s): CKMB, TROPONINI, MYOGLOBIN in the last 168 hours.  Invalid input(s): CK ------------------------------------------------------------------------------------------------------------------    Component Value Date/Time   BNP 1,462.0 (H) 05/22/2020 1115    Roxan Hockey M.D on 05/26/2020 at 12:28 PM  Go to [email or web address] - for contact info  [facility] - Office  [phone]

## 2020-05-26 NOTE — Progress Notes (Signed)
    Subjective: Feels breathing is improved. Small amount of blood-tinged mucus when blowing nose. Feels dry. Oxygen requirements down to 5 liters from 10 liters yesterday. Tolerating diet. No overt GI bleeding.   Objective: Vital signs in last 24 hours: Temp:  [98.1 F (36.7 C)-99.1 F (37.3 C)] 99.1 F (37.3 C) (09/01 0518) Pulse Rate:  [74-87] 87 (09/01 0518) Resp:  [18-22] 22 (09/01 0518) BP: (137-154)/(55-63) 154/63 (09/01 0518) SpO2:  [92 %-100 %] 92 % (09/01 0745) Weight:  [107.7 kg] 107.7 kg (09/01 0518) Last BM Date: 05/23/20 General:   Alert and oriented, pleasant, 5 liters nasal cannula, sitting up on side of bed Abdomen:  Bowel sounds present, soft, non-tender, non-distended. Obese Extremities:  Improved lower extremity edema bilaterally, 1+ pitting, TED hose in place. 2+ pedal edema Neurologic:  Alert and  oriented x4 Psych:   Normal mood and affect.  Intake/Output from previous day: 08/31 0701 - 09/01 0700 In: 609.4 [P.O.:240; IV Piggyback:369.4] Out: 550 [Urine:550] Intake/Output this shift: No intake/output data recorded.  Lab Results: Recent Labs    05/24/20 0622 05/25/20 0959 05/26/20 0558  WBC 10.5 9.5 10.2  HGB 8.2* 8.4* 7.8*  HCT 27.2* 28.0* 26.6*  PLT 267 291 292   BMET Recent Labs    05/24/20 0622 05/26/20 0558  NA 141 139  K 4.4 4.6  CL 111 107  CO2 22 20*  GLUCOSE 134* 209*  BUN 68* 65*  CREATININE 3.57* 4.15*  CALCIUM 8.3* 8.6*     Studies/Results: DG Chest 2 View  Result Date: 05/24/2020 CLINICAL DATA:  Shortness of breath. EXAM: CHEST - 2 VIEW COMPARISON:  May 22, 2020. FINDINGS: Stable cardiomegaly. No pneumothorax or pleural effusion is noted. Left-sided pacemaker is unchanged in position. Left lung is clear. Increased right upper lobe airspace opacity is noted concerning for worsening pneumonia. Bony thorax is unremarkable. IMPRESSION: Worsening right upper lobe pneumonia. Electronically Signed   By: Marijo Conception M.D.    On: 05/24/2020 10:00    Assessment: Pleasant 79 year old female with multiple comorbidities including but not limited to history of severe pulmonary hypertension, heart failure, complete heart block s/p pacemaker placement, PE in March 2021,presenting with acute respiratory failure, sepsis secondary to pneumonia, found to have dark, heme positive stools after restarting Eliquis, which had been on hold for AV fistula creation.   Acute blood loss anemia with Hgb historically in the 10/11 range and 9.1 on admission, continuing to slowly downward trend and 7.8 today but without overt GI bleeding. She has stated blood-tinged mucus with blowing nose but does not appear significant. Eliquis remains on hold.   CKD: creatinine increased today to 4.15. Appears she has been in the 3 range prior to admission. AV fistula creation last month preparing for possible dialysis in future.   Respiratory status improved from yesterday, with O2 requirements 5 liters down from 10 liters. CXR for today.  Not a candidate for endoscopic procedures currently until maximized from respiratory standpoint and oxygen requirements significantly improved. Will continue to reassess daily.   Plan: Follow H/H Eliquis on hold BID PPI Soft diet Will continue to follow daily EGD only when optimized from respiratory standpoint   Cassandra Needs, PhD, ANP-BC [facility]    LOS: 4 days    05/26/2020, 8:21 AM

## 2020-05-26 NOTE — Progress Notes (Signed)
Most likely will need home O2

## 2020-05-27 ENCOUNTER — Telehealth: Payer: Self-pay | Admitting: Gastroenterology

## 2020-05-27 LAB — BASIC METABOLIC PANEL
Anion gap: 10 (ref 5–15)
BUN: 63 mg/dL — ABNORMAL HIGH (ref 8–23)
CO2: 20 mmol/L — ABNORMAL LOW (ref 22–32)
Calcium: 8.6 mg/dL — ABNORMAL LOW (ref 8.9–10.3)
Chloride: 110 mmol/L (ref 98–111)
Creatinine, Ser: 3.93 mg/dL — ABNORMAL HIGH (ref 0.44–1.00)
GFR calc Af Amer: 12 mL/min — ABNORMAL LOW (ref >60)
GFR calc non Af Amer: 10 mL/min — ABNORMAL LOW (ref >60)
Glucose, Bld: 111 mg/dL — ABNORMAL HIGH (ref 70–99)
Potassium: 4.2 mmol/L (ref 3.5–5.1)
Sodium: 140 mmol/L (ref 135–145)

## 2020-05-27 LAB — CBC
HCT: 26.4 % — ABNORMAL LOW (ref 36.0–46.0)
Hemoglobin: 7.8 g/dL — ABNORMAL LOW (ref 12.0–15.0)
MCH: 28.4 pg (ref 26.0–34.0)
MCHC: 29.5 g/dL — ABNORMAL LOW (ref 30.0–36.0)
MCV: 96 fL (ref 80.0–100.0)
Platelets: 333 10*3/uL (ref 150–400)
RBC: 2.75 MIL/uL — ABNORMAL LOW (ref 3.87–5.11)
RDW: 14.5 % (ref 11.5–15.5)
WBC: 10.5 10*3/uL (ref 4.0–10.5)
nRBC: 0 % (ref 0.0–0.2)

## 2020-05-27 LAB — GLUCOSE, CAPILLARY
Glucose-Capillary: 103 mg/dL — ABNORMAL HIGH (ref 70–99)
Glucose-Capillary: 158 mg/dL — ABNORMAL HIGH (ref 70–99)
Glucose-Capillary: 158 mg/dL — ABNORMAL HIGH (ref 70–99)
Glucose-Capillary: 170 mg/dL — ABNORMAL HIGH (ref 70–99)

## 2020-05-27 LAB — CULTURE, BLOOD (ROUTINE X 2)
Culture: NO GROWTH
Culture: NO GROWTH
Special Requests: ADEQUATE

## 2020-05-27 NOTE — Progress Notes (Signed)
Patient Demographics:    Cassandra Parker, is a 79 y.o. female, DOB - January 07, 1941, VOZ:[id number]  Admit date - 05/22/2020   Admitting Physician Calene Paradiso Denton Brick, MD  Outpatient Primary MD for the patient is [name], [name], MD  LOS - 5  Chief Complaint  Patient presents with   Shortness of Breath        Subjective:    Cassandra Parker today has no fevers, no emesis,  No chest pain,    --Dyspnea on exertion persist, oxygen requirement improving, -Cough and shortness of breath at rest improved   Assessment  & Plan :    Principal Problem:   Sepsis due to PNA Active Problems:   Cardiomyopathy- EF 35-40% (etiology not yet determined)   Respiratory failure with hypoxia (HCC)   Rt U L Pneumonia   GI bleed   HTN (hypertension)   Diabetes mellitus type 2, controlled, without complications ([name])   Cancer of breast- s/p chemo and Rt mastectomy 2008   Mitral stenosis- moderate with trivial MR    Severe Pulmonary HTN--  Brief Summary:- 79 y.o. female with CKDIV, s/p right mastectomy and presumably axillary node dissection, s/p pacemaker on the left side, s/p  AV fistula creation on 05/18/2020, HTN, DM2, HLD, History of Complete Heart Block s/p Pacemaker placement, HFpEF, chronic Anemia and h/o PE (11/2019) on Eliquis admitted with acute hypoxic respiratory failure secondary to pneumonia with echo showing severe pulmonary hypertension   A/p 1)Sepsis Secondary to Pneumonia---- --most likely aspiration related post anesthesia Patient met sepsis criteria on admission with finding of right-sided pneumonia, leukocytosis,  tachypnea, and hypoxia -No tachycardia most likely due to carvedilol use -Lactic acid was not elevated -WBC 11.9 >> 10.5 >>9.5>>10.2>.10.5 Repeat CXR on 05/26/20 with persistent right-sided pneumonia -Continue IV Rocephin and azithromycin, mucolytics and supplemental oxygen as  ordered 05/27/20-O2 sats 93% on 3 L  -will Repeat chest x-ray on 05/28/2020   2) GI bleed--- patient with dark stools after restarting Eliquis, --Hgb 10.5 >> 9.1 >>8.0 >>8.6>>8.2 >>8.4>>7.8>>7.8 ( baseline that is usually close to 11) -c/n Protonix -- stool for occult blood -- Positive -Transfuse as clinically indicated -Stopped Eliquis -GI consult from Dr. Gala Parker appreciated for possible endoluminal evaluation -Respiratory status precludes endoluminal evaluation/sedation at this time -H&H appears to have stabilized after stopping Eliquis And  after initial drop -Had light brown stool on 05/27/2020 -Plan is for outpatient endoluminal evaluation once respiratory status improves further especially if Eliquis is not restarted  3)HFpEF/chronic diastolic dysfunction CHF/pulmonary hypertension--elevated BNP noted, -Echo from 05/23/2020 with EF of 60 to 65% with severe pulmonary hypertension and grade 1 diastolic dysfunction -PCCM consult requested for severe pulmonary hypertension -Troponin is 114>> 117--- this is consistent with patient prior baseline --- bnp 1,462 no baseline available --Chest x-ray without overt failure, hypoxia most likely from #1 above -c/n  Coreg -Developed AKI on CKD with IV Lasix, give diuretic holiday on 05/26/2020 resume p.o. Lasix 40 mg on 05/27/2020 -Chest x-ray on 05/26/2020 without fluid overload -Repeat chest x-ray on 05/28/2020  4)Acute hypoxic respiratory failure/severe pulmonary hypertension --- likely secondary to pneumonia as above #1 --She is found to be hypoxic with O2 sats of 68 % on room air, currently on 6 L of oxygen---  --previously no hypoxia  and PTA patient was not on home O2 --- Manage as above #1 -PCCM consult appreciated --Anticipate patient will probably be discharged home on home O2 -Oxygen titrated down to 3 L from 10 L/min couple days ago  5)RUL PNA-- CAP--- suspect aspiration related, chest x-ray suggestive of right upper lobe pneumonia in the  setting of recent anesthesia/sedation for AV fistula creation on 05/18/2020 raises concern about possible aspiration -Repeat CXR on 05/26/20 with persistent right upper lobe pneumonia --Manage as above #1  6)H/o PE--diagnosed in March 2021 with VQ scan that was intermediate in the setting of elevated D-dimer -Patient has severe pulmonary hypertension-PCCM consult requested for severe pulmonary hypertension -Patient was unable to do CTA chest at that time due to kidney concerns -He has been compliant with Eliquis except for  holding her Eliquis from 05/14/2020 through 07/18/2020 inclusive to allow for right upper extremity AV fistula creation, she restarted Eliquis on 05/19/2020 --- Given dark stools and drop in H&H Eliquis was stopped  --lower extremity venous Dopplers--without acute DVT  7)History of Complete Heart Block s/p Pacemaker placement -EKG shows paced rhythm. Stable. Routine follow-up as outpatient.  8)AKI on CKD IV--creatinine up to 4.15 from 3.5 after IV Lasix was given on 05/25/2020  -Creatinine trended back down, currently 3.93 -Hold further IV Lasix, may restart oral Lasix on 05/27/2020 --Status post AV fistula creation on 05/18/2020 in anticipation of HD in the near term -- renally adjust medications, avoid nephrotoxic agents / dehydration  / hypotension   9)Diabetes mellitus type 2 -Blood sugars running higher okay to restart glipizide as patient is tolerating oral intake -Sliding scale insulin coverage with fingerstick monitoring   10)History of Rt Breast cancer -S/p chemo and Rt mastectomy 2008 Status post right-sided mastectomy and chemotherapy -Stable, follow-up as outpatient  11)Acute on chronicAnemia--at baseline patient has anemia of CKD -Suspect worsening anemia with hemoglobin  --Hgb 10.5 >> 9.1 >>8.0 >>8.6>>8.2>.8.4>.7.8 --baseline close to 11 is due to acute GI bleed/ABLA -Protonix as above #2, GI consult noted  code Status:full  Family  Communication: (patient is alert, awake and coherent) Previously discussed with daughter   Disposition/Need for in-Hospital Stay- patient unable to be discharged at this time due to --Sepsis treatment for pneumonia requiring Iv antibiotics and Gi Bleed requiring further endoluminal evaluation -Possible discharge home on 05/28/2020 on home O2  Status is: Inpatient  Remains inpatient appropriate because:Sepsis treatment for pneumonia requiring Iv antibiotics and Gi Bleed requiring further endoluminal evaluation -Possible discharge home on 05/28/2020 on home O2  Disposition: The patient is from: Home              Anticipated d/c is to: Home              Anticipated d/c date is: 2 days              Patient currently is not medically stable to d/c. Barriers: Not Clinically Stable- Sepsis treatment for pneumonia requiring Iv antibiotics and Gi Bleed requiring further endoluminal evaluation -Possible discharge home on 05/28/2020 on home O2 Consults  :  Gi  DVT Prophylaxis  :    - SCDs (Gi bleed)  Lab Results  Component Value Date   PLT 333 05/27/2020    Inpatient Medications  Scheduled Meds:  amLODipine  10 mg Oral Daily   azithromycin  500 mg Oral Daily   carvedilol  3.125 mg Oral BID   cholecalciferol  2,000 Units Oral Daily   furosemide  40 mg Oral Daily   glipiZIDE  5 mg Oral BID AC   guaiFENesin  600 mg Oral BID   hydrALAZINE  50 mg Oral BID   insulin aspart  0-5 Units Subcutaneous QHS   insulin aspart  0-9 Units Subcutaneous TID WC   ipratropium-albuterol  3 mL Nebulization BID   multivitamin with minerals  1 tablet Oral Daily   pantoprazole  40 mg Oral BID AC   pravastatin  10 mg Oral QHS   sodium chloride flush  3 mL Intravenous Q12H   vitamin B-12  1,000 mcg Oral Daily   Continuous Infusions:  sodium chloride     cefTRIAXone (ROCEPHIN)  IV Stopped (05/27/20 1030)   PRN Meds:.sodium chloride, acetaminophen **OR** acetaminophen, albuterol,  HYDROcodone-acetaminophen, labetalol, ondansetron **OR** ondansetron (ZOFRAN) IV, polyethylene glycol, sodium chloride flush, traZODone    Anti-infectives (From admission, onward)   Start     Dose/Rate Route Frequency Ordered Stop   05/27/20 1000  azithromycin (ZITHROMAX) 500 mg in sodium chloride 0.9 % 250 mL IVPB  Status:  Discontinued        500 mg 250 mL/hr over 60 Minutes Intravenous Every 24 hours 05/26/20 1406 05/26/20 1407   05/27/20 1000  azithromycin (ZITHROMAX) tablet 500 mg        500 mg Oral Daily 05/26/20 1407     05/23/20 1000  cefTRIAXone (ROCEPHIN) 2 g in sodium chloride 0.9 % 100 mL IVPB        2 g 200 mL/hr over 30 Minutes Intravenous Every 24 hours 05/22/20 1640     05/23/20 1000  azithromycin (ZITHROMAX) 500 mg in sodium chloride 0.9 % 250 mL IVPB  Status:  Discontinued        500 mg 250 mL/hr over 60 Minutes Intravenous Every 24 hours 05/22/20 1640 05/26/20 1406   05/22/20 1130  cefTRIAXone (ROCEPHIN) 1 g in sodium chloride 0.9 % 100 mL IVPB        1 g 200 mL/hr over 30 Minutes Intravenous  Once 05/22/20 1129 05/22/20 1230   05/22/20 1130  azithromycin (ZITHROMAX) 500 mg in sodium chloride 0.9 % 250 mL IVPB        500 mg 250 mL/hr over 60 Minutes Intravenous  Once 05/22/20 1129 05/22/20 1346        Objective:   Vitals:   05/26/20 2109 05/27/20 0944 05/27/20 1305 05/27/20 1454  BP: (!) 153/49 (!) 144/70  (!) 132/58  Pulse: 88 91  79  Resp: 20 18  18   Temp: 100 F (37.8 C) 98.4 F (36.9 C)  98.8 F (37.1 C)  TempSrc: Oral Oral  Oral  SpO2: 90% 95% 96% 95%  Weight:      Height:        Wt Readings from Last 3 Encounters:  05/26/20 107.7 kg  05/18/20 103.4 kg  05/14/20 103.7 kg     Intake/Output Summary (Last 24 hours) at 05/27/2020 1852 Last data filed at 05/27/2020 1030 Gross per 24 hour  Intake 100 ml  Output 350 ml  Net -250 ml   Physical Exam  Gen:- Awake Alert,  morbidly obese, dyspnea on exertion persist HEENT:- [place].AT, No sclera  icterus Nose- [place] 3L/min Neck-Supple Neck ,. +ve JVD Lungs-improving air movement,, scattered rhonchi on the Rt CV- S1, S2 normal, regular  Abd-  +ve B.Sounds, Abd Soft, increased truncal adiposity Extremity/Skin:-Bilateral lower extremity edema right more than left, pedal pulses present Psych-affect is appropriate, oriented x3 Neuro-generalized weakness ,no new focal deficits, no tremors   Data Review:   Micro Results  Recent Results (from the past 240 hour(s))  Culture, blood (Routine X 2) w Reflex to ID Panel     Status: None   Collection Time: 05/22/20 11:17 AM   Specimen: Left Antecubital; Blood  Result Value Ref Range Status   Specimen Description LEFT ANTECUBITAL  Final   Special Requests   Final    BOTTLES DRAWN AEROBIC AND ANAEROBIC Blood Culture adequate volume   Culture   Final    NO GROWTH 5 DAYS Performed at [facility], [address], [place], [place] [address]    Report Status 05/27/2020 FINAL  Final  SARS Coronavirus 2 by RT PCR (hospital order, performed in [facility] hospital lab) Nasopharyngeal Nasopharyngeal Swab     Status: None   Collection Time: 05/22/20 12:01 PM   Specimen: Nasopharyngeal Swab  Result Value Ref Range Status   SARS Coronavirus 2 NEGATIVE NEGATIVE Final    Comment: (NOTE) SARS-CoV-2 target nucleic acids are NOT DETECTED.  The SARS-CoV-2 RNA is generally detectable in upper and lower respiratory specimens during the acute phase of infection. The lowest concentration of SARS-CoV-2 viral copies this assay can detect is 250 copies / mL. A negative result does not preclude SARS-CoV-2 infection and should not be used as the sole basis for treatment or other patient management decisions.  A negative result may occur with improper specimen collection / handling, submission of specimen other than nasopharyngeal swab, presence of viral mutation(s) within the areas targeted by this assay, and inadequate number of viral copies (<250 copies / mL).  A negative result must be combined with clinical observations, patient history, and epidemiological information.  Fact Sheet for Patients:   StrictlyIdeas.no  Fact Sheet for Healthcare Providers: BankingDealers.co.za  This test is not yet approved or  cleared by the [place] FDA and has been authorized for detection and/or diagnosis of SARS-CoV-2 by FDA under an Emergency Use Authorization (EUA).  This EUA will remain in effect (meaning this test can be used) for the duration of the COVID-19 declaration under Section 564(b)(1) of the Act, 21 U.S.C. section 360bbb-3(b)(1), unless the authorization is terminated or revoked sooner.  Performed at [facility], [address] [place], [place] [address]   Culture, blood (Routine X 2) w Reflex to ID Panel     Status: None   Collection Time: 05/22/20  6:40 PM   Specimen: BLOOD  Result Value Ref Range Status   Specimen Description BLOOD BLOOD LEFT ARM  Final   Special Requests   Final    BOTTLES DRAWN AEROBIC AND ANAEROBIC Blood Culture results may not be optimal due to an inadequate volume of blood received in culture bottles   Culture   Final    NO GROWTH 5 DAYS Performed at [facility], [address], [place], [place] [address]    Report Status 05/27/2020 FINAL  Final    Radiology Reports DG Chest 2 View  Result Date: 05/26/2020 CLINICAL DATA:  SOB with cough. EXAM: CHEST - 2 VIEW COMPARISON:  05/24/2020 FINDINGS: Left chest wall pacer is noted with leads in the right atrial appendage, right ventricle and coronary sinus. Stable cardiomediastinal contours. Extensive airspace disease within the right upper lobe is again noted and appears unchanged. Left lung is clear. No pleural effusion or edema identified. IMPRESSION: 1. No change in right upper lobe pneumonia. Electronically Signed   By: Kerby Moors M.D.   On: 05/26/2020 10:40   DG Chest 2 View  Result Date: 05/24/2020 CLINICAL  DATA:  Shortness  of breath. EXAM: CHEST - 2 VIEW COMPARISON:  May 22, 2020. FINDINGS: Stable cardiomegaly. No pneumothorax or pleural effusion is noted. Left-sided pacemaker is unchanged in position. Left lung is clear. Increased right upper lobe airspace opacity is noted concerning for worsening pneumonia. Bony thorax is unremarkable. IMPRESSION: Worsening right upper lobe pneumonia. Electronically Signed   By: Marijo Conception M.D.   On: 05/24/2020 10:00   US Venous Img Lower Bilateral (DVT)  Result Date: 05/23/2020 CLINICAL DATA:  History of pulmonary embolism. Evaluate for lower extremity DVT. EXAM: BILATERAL LOWER EXTREMITY VENOUS DOPPLER ULTRASOUND TECHNIQUE: Gray-scale sonography with graded compression, as well as color Doppler and duplex ultrasound were performed to evaluate the lower extremity deep venous systems from the level of the common femoral vein and including the common femoral, femoral, profunda femoral, popliteal and calf veins including the posterior tibial, peroneal and gastrocnemius veins when visible. The superficial great saphenous vein was also interrogated. Spectral Doppler was utilized to evaluate flow at rest and with distal augmentation maneuvers in the common femoral, femoral and popliteal veins. COMPARISON:  12/22/2019 FINDINGS: RIGHT LOWER EXTREMITY Common Femoral Vein: No evidence of thrombus. Normal compressibility, respiratory phasicity and response to augmentation. Saphenofemoral Junction: No evidence of thrombus. Normal compressibility and flow on color Doppler imaging. Profunda Femoral Vein: No evidence of thrombus. Normal compressibility and flow on color Doppler imaging. Femoral Vein: No evidence of thrombus. Normal compressibility, respiratory phasicity and response to augmentation. Popliteal Vein: No evidence of thrombus. Normal compressibility, respiratory phasicity and response to augmentation. Calf Veins: Visualized right deep calf veins are patent without  thrombus. Limited evaluation. Superficial Great Saphenous Vein: No evidence of thrombus. Normal compressibility. Other Findings:  None. LEFT LOWER EXTREMITY Common Femoral Vein: No evidence of thrombus. Normal compressibility, respiratory phasicity and response to augmentation. Saphenofemoral Junction: No evidence of thrombus. Normal compressibility and flow on color Doppler imaging. Profunda Femoral Vein: No evidence of thrombus. Normal compressibility and flow on color Doppler imaging. Femoral Vein: No evidence of thrombus. Normal compressibility, respiratory phasicity and response to augmentation. Popliteal Vein: No evidence of thrombus. Normal compressibility, respiratory phasicity and response to augmentation. Calf Veins: Visualized left deep calf veins are patent without thrombus. Limited evaluation. Superficial Great Saphenous Vein: No evidence of thrombus. Normal compressibility. Other Findings:  None. IMPRESSION: No evidence of deep venous thrombosis in either lower extremity. Electronically Signed   By: Markus Daft M.D.   On: 05/23/2020 12:21   DG Chest Portable 1 View  Result Date: 05/22/2020 CLINICAL DATA:  Shortness of breath, hypoxemia, end-stage renal disease on dialysis, LEFT-sided crackles, RIGHT-side week EXAM: PORTABLE CHEST 1 VIEW COMPARISON:  Portable exam 1042 hours compared to 12/21/2019 FINDINGS: LEFT subclavian sequential pacemaker with leads projecting over RIGHT atrium, RIGHT ventricle, and coronary sinus. Enlargement of cardiac silhouette with vascular congestion. Minimal LEFT base atelectasis. RIGHT perihilar opacities extending into RIGHT upper lobe favor pneumonia although requiring follow-up until resolution to exclude underlying nodule. Question mild enlargement of RIGHT hilum versus perihilar infiltrate. Remaining lungs clear. No pleural effusion or pneumothorax. Bones demineralized. IMPRESSION: Enlargement of cardiac silhouette post pacemaker. Questionable RIGHT hilar  enlargement/adenopathy versus perihilar infiltrate. RIGHT upper lobe pneumonia; follow-up exams until resolution recommended to exclude underlying pulmonary mass/nodule and hilar adenopathy. Electronically Signed   By: Lavonia Dana M.D.   On: 05/22/2020 11:16   VAS [place] UPPER EXTREMITY ARTERIAL DUPLEX  Result Date: 04/29/2020 UPPER EXTREMITY DUPLEX STUDY Indications: Patient complains of HD access pre op.  Performing Technologist: Ralene Cork RVT  Examination Guidelines: A complete evaluation includes B-mode imaging, spectral Doppler, color Doppler, and power Doppler as needed of all accessible portions of each vessel. Bilateral testing is considered an integral part of a complete examination. Limited examinations for reoccurring indications may be performed as noted.  Right Pre-Dialysis Findings: +-----------------------+----------+--------------------+---------+--------+  Location                PSV (cm/s) Intralum. Diam. (cm) Waveform  Comments  +-----------------------+----------+--------------------+---------+--------+  Brachial Antecub. fossa 98         0.50                 triphasic           +-----------------------+----------+--------------------+---------+--------+  Radial Art at Wrist     76         0.19                 triphasic           +-----------------------+----------+--------------------+---------+--------+  Ulnar Art at Wrist      66         0.16                 triphasic           +-----------------------+----------+--------------------+---------+--------+ Left Pre-Dialysis Findings: +-----------------------+----------+--------------------+---------+--------+  Location                PSV (cm/s) Intralum. Diam. (cm) Waveform  Comments  +-----------------------+----------+--------------------+---------+--------+  Brachial Antecub. fossa 81         0.52                 triphasic           +-----------------------+----------+--------------------+---------+--------+  Radial Art at Wrist     58          0.21                 triphasic           +-----------------------+----------+--------------------+---------+--------+  Ulnar Art at Wrist      61         0.15                 triphasic           +-----------------------+----------+--------------------+---------+--------+  Summary:  Right: Patent brachial, radial, and ulnar arteries with calcified        walls in the radial and ulnar arteries. Left: Patent brachial, radial, and ulnar arteries with calcified       walls in the radial and ulnar arteries. *See table(s) above for measurements and observations. Electronically signed by Ruta Hinds MD on 04/29/2020 at 11:57:00 AM.    Final    ECHOCARDIOGRAM COMPLETE  Result Date: 05/23/2020    ECHOCARDIOGRAM REPORT   Patient Name:   ED RAYSON Date of Exam: 05/23/2020 Medical Rec #:  [id number]          Height:       63.0 in Accession #:    [id number]         Weight:       233.0 lb Date of Birth:  12-30-1940         BSA:          2.063 m Patient Age:    52 years           BP:           148/63 mmHg Patient Gender: F  HR:           83 bpm. Exam Location:  [facility] Procedure: 2D Echo, Cardiac Doppler and Color Doppler Indications:    Dyspnea  History:        Patient has prior history of Echocardiogram examinations, most                 recent 12/23/2019. CHF, Pacemaker, Mitral Valve Disease,                 Signs/Symptoms:Shortness of Breath; Risk Factors:Diabetes and                 Hypertension. Sepsis, pneumonia.  Sonographer:    Dustin Flock RDCS Referring Phys: [place]  1. Left ventricular ejection fraction, by estimation, is 60 to 65%. The left ventricle has normal function. The left ventricle has no regional wall motion abnormalities. There is moderate left ventricular hypertrophy. Left ventricular diastolic parameters are consistent with Grade I diastolic dysfunction (impaired relaxation).  2. Right ventricular systolic function is normal. The right  ventricular size is normal. There is severely elevated pulmonary artery systolic pressure. The estimated right ventricular systolic pressure is 90.2 mmHg. Device wire present.  3. Left atrial size was mildly dilated.  4. The mitral valve is abnormal, mildly calcified and with moderate annular calcification. Mild mitral valve regurgitation.  5. The aortic valve is tricuspid. Aortic valve regurgitation is not visualized.  6. The inferior vena cava is dilated in size with <50% respiratory variability, suggesting right atrial pressure of 15 mmHg. FINDINGS  Left Ventricle: Left ventricular ejection fraction, by estimation, is 60 to 65%. The left ventricle has normal function. The left ventricle has no regional wall motion abnormalities. The left ventricular internal cavity size was normal in size. There is  moderate left ventricular hypertrophy. Left ventricular diastolic parameters are consistent with Grade I diastolic dysfunction (impaired relaxation). Right Ventricle: The right ventricular size is normal. No increase in right ventricular wall thickness. Right ventricular systolic function is normal. There is severely elevated pulmonary artery systolic pressure. The tricuspid regurgitant velocity is 3.49 m/s, and with an assumed right atrial pressure of 15 mmHg, the estimated right ventricular systolic pressure is 40.9 mmHg. Left Atrium: Left atrial size was mildly dilated. Right Atrium: Right atrial size was normal in size. Pericardium: There is no evidence of pericardial effusion. Mitral Valve: The mitral valve is abnormal. There is mild calcification of the mitral valve leaflet(s). Moderate mitral annular calcification. Mild mitral valve regurgitation. Tricuspid Valve: The tricuspid valve is grossly normal. Tricuspid valve regurgitation is mild. Aortic Valve: The aortic valve is tricuspid. Aortic valve regurgitation is not visualized. Mild aortic valve annular calcification. Pulmonic Valve: The pulmonic valve was  grossly normal. Pulmonic valve regurgitation is mild. Aorta: The aortic root is normal in size and structure. Venous: The inferior vena cava is dilated in size with less than 50% respiratory variability, suggesting right atrial pressure of 15 mmHg. IAS/Shunts: No atrial level shunt detected by color flow Doppler. Additional Comments: A pacer wire is visualized.  LEFT VENTRICLE PLAX 2D LVIDd:         4.24 cm  Diastology LVIDs:         3.01 cm  LV e' lateral:   3.59 cm/s LV PW:         1.50 cm  LV E/e' lateral: 33.4 LV IVS:        1.52 cm  LV e' medial:    5.44 cm/s LVOT diam:  2.00 cm  LV E/e' medial:  22.1 LV SV:         93 LV SV Index:   45 LVOT Area:     3.14 cm  RIGHT VENTRICLE RV Basal diam:  3.78 cm RV S prime:     9.90 cm/s TAPSE (M-mode): 3.2 cm LEFT ATRIUM             Index       RIGHT ATRIUM           Index LA diam:        4.30 cm 2.08 cm/m  RA Area:     21.90 cm LA Vol (A2C):   52.2 ml 25.30 ml/m RA Volume:   67.60 ml  32.76 ml/m LA Vol (A4C):   74.4 ml 36.06 ml/m LA Biplane Vol: 69.1 ml 33.49 ml/m  AORTIC VALVE LVOT Vmax:   130.00 cm/s LVOT Vmean:  96.800 cm/s LVOT VTI:    0.295 m  AORTA Ao Root diam: 3.00 cm MITRAL VALVE                TRICUSPID VALVE MV Area (PHT): 3.28 cm     TR Peak grad:   48.7 mmHg MV Decel Time: 231 msec     TR Vmax:        349.00 cm/s MV E velocity: 120.00 cm/s MV A velocity: 125.00 cm/s  SHUNTS MV E/A ratio:  0.96         Systemic VTI:  0.30 m                             Systemic Diam: 2.00 cm Rozann Lesches MD Electronically signed by Rozann Lesches MD Signature Date/Time: 05/23/2020/11:07:51 AM    Final    VAS [place] UPPER EXTREMITY VEIN MAPPING  Result Date: 04/29/2020 UPPER EXTREMITY VEIN MAPPING  Indications: Pre-access. Performing Technologist: Ralene Cork RVT  Examination Guidelines: A complete evaluation includes B-mode imaging, spectral Doppler, color Doppler, and power Doppler as needed of all accessible portions of each vessel. Bilateral testing is  considered an integral part of a complete examination. Limited examinations for reoccurring indications may be performed as noted. +-----------------+-------------+----------+--------+  Right Cephalic    Diameter (cm) Depth (cm) Findings  +-----------------+-------------+----------+--------+  Shoulder              0.55                           +-----------------+-------------+----------+--------+  Prox upper arm        0.53                           +-----------------+-------------+----------+--------+  Mid upper arm         0.48                           +-----------------+-------------+----------+--------+  Dist upper arm        0.47                           +-----------------+-------------+----------+--------+  Antecubital fossa     0.66                           +-----------------+-------------+----------+--------+  Prox forearm  0.34                           +-----------------+-------------+----------+--------+  Mid forearm           0.30                           +-----------------+-------------+----------+--------+  Dist forearm          0.24                 lateral   +-----------------+-------------+----------+--------+ +-----------------+-------------+----------+--------+  Right Basilic     Diameter (cm) Depth (cm) Findings  +-----------------+-------------+----------+--------+  Mid upper arm         0.43                           +-----------------+-------------+----------+--------+  Dist upper arm        0.52                           +-----------------+-------------+----------+--------+  Antecubital fossa     0.28                           +-----------------+-------------+----------+--------+ +-----------------+-------------+----------+--------------+  Left Cephalic     Diameter (cm) Depth (cm)    Findings     +-----------------+-------------+----------+--------------+  Shoulder              0.28                                 +-----------------+-------------+----------+--------------+  Prox  upper arm        0.18                                 +-----------------+-------------+----------+--------------+  Mid upper arm         0.18                                 +-----------------+-------------+----------+--------------+  Dist upper arm        0.22                                 +-----------------+-------------+----------+--------------+  Antecubital fossa     0.25                                 +-----------------+-------------+----------+--------------+  Prox forearm          0.16                                 +-----------------+-------------+----------+--------------+  Mid forearm           0.12                                 +-----------------+-------------+----------+--------------+  Dist forearm  not visualized  +-----------------+-------------+----------+--------------+ +-----------------+-------------+----------+--------+  Left Basilic      Diameter (cm) Depth (cm) Findings  +-----------------+-------------+----------+--------+  Mid upper arm         0.38                           +-----------------+-------------+----------+--------+  Dist upper arm        0.48                           +-----------------+-------------+----------+--------+  Antecubital fossa     0.25                           +-----------------+-------------+----------+--------+ Summary: Right: Patent cephalic and basilic veins. Left: Patent cephalic and basilic veins. *See table(s) above for measurements and observations.  Diagnosing physician: Ruta Hinds MD Electronically signed by Ruta Hinds MD on 04/29/2020 at 11:57:08 AM.    Final      CBC Recent Labs  Lab 05/22/20 1115 05/22/20 2129 05/23/20 1601 05/24/20 0622 05/25/20 0959 05/26/20 0558 05/27/20 0550  WBC 11.9*  --  10.6* 10.5 9.5 10.2 10.5  HGB 9.1*   < > 8.6* 8.2* 8.4* 7.8* 7.8*  HCT 30.1*   < > 29.3* 27.2* 28.0* 26.6* 26.4*  PLT 247  --  264 267 291 292 333  MCV 96.5  --  97.3 98.2 98.2 97.4 96.0  MCH 29.2  --   28.6 29.6 29.5 28.6 28.4  MCHC 30.2  --  29.4* 30.1 30.0 29.3* 29.5*  RDW 14.9  --  14.8 14.8 14.7 14.8 14.5  LYMPHSABS 1.7  --   --   --   --   --   --   MONOABS 0.9  --   --   --   --   --   --   EOSABS 0.0  --   --   --   --   --   --   BASOSABS 0.1  --   --   --   --   --   --    < > = values in this interval not displayed.    Chemistries  Recent Labs  Lab 05/22/20 1115 05/23/20 0735 05/24/20 0622 05/26/20 0558 05/27/20 0550  NA 138 140 141 139 140  K 4.2 4.2 4.4 4.6 4.2  CL 109 111 111 107 110  CO2 21* 21* 22 20* 20*  GLUCOSE 250* 149* 134* 209* 111*  BUN 73* 75* 68* 65* 63*  CREATININE 3.56* 3.59* 3.57* 4.15* 3.93*  CALCIUM 8.5* 8.5* 8.3* 8.6* 8.6*  AST 46*  --   --   --   --   ALT 34  --   --   --   --   ALKPHOS 67  --   --   --   --   BILITOT 0.6  --   --   --   --    ------------------------------------------------------------------------------------------------------------------ No results for input(s): CHOL, HDL, LDLCALC, TRIG, CHOLHDL, LDLDIRECT in the last 72 hours.  Lab Results  Component Value Date   HGBA1C 7.4 (H) 05/22/2020   ------------------------------------------------------------------------------------------------------------------ No results for input(s): TSH, T4TOTAL, T3FREE, THYROIDAB in the last 72 hours.  Invalid input(s): FREET3 ------------------------------------------------------------------------------------------------------------------ No results for input(s): VITAMINB12, FOLATE, FERRITIN, TIBC, IRON, RETICCTPCT in the last 72 hours.  Coagulation profile Recent Labs  Lab 05/22/20 1115  INR 1.9*  No results for input(s): DDIMER in the last 72 hours.  Cardiac Enzymes No results for input(s): CKMB, TROPONINI, MYOGLOBIN in the last 168 hours.  Invalid input(s): CK ------------------------------------------------------------------------------------------------------------------    Component Value Date/Time   BNP 1,462.0 (H)  05/22/2020 1115    Roxan Hockey M.D on 05/27/2020 at 6:52 PM  Go to [email or web address] - for contact info  [facility] - Office  [phone]

## 2020-05-27 NOTE — Telephone Encounter (Signed)
Spoke with pts daughter. I will call pt next week to discuss blood work and an appointment.

## 2020-05-27 NOTE — TOC Progression Note (Signed)
Transition of Care [facility]) - Progression Note    Patient Details  Name: Cassandra Parker MRN: [id number] Date of Birth: 1940-12-18  Transition of Care [facility]) CM/SW [place], [place] Phone Number: 05/27/2020, 2:19 PM  Clinical Narrative:    TOC referred pt to [facility] with [employer] for home oxygen and rollator walker. Barbaraann Rondo accepted. Discharge plan is for tomorrow, Barbaraann Rondo will deliver.    Expected Discharge Plan: Home/Self Care Barriers to Discharge: Continued Medical Work up  Expected Discharge Plan and Services Expected Discharge Plan: Home/Self Care       Living arrangements for the past 2 months: Single Family Home (Has ramp)                 DME Arranged: Oxygen, Walker rolling with seat DME Agency: [employer] Date DME Agency Contacted: 05/27/20 Time DME Agency Contacted: [id number] Representative spoke with at DME Agency: [place] ([place]) Interventions    Readmission Risk Interventions Readmission Risk Prevention Plan 05/27/2020  Transportation Screening Complete  HRI or [place] Complete  Social Work Consult for [place] Planning/Counseling Complete  Palliative Care Screening Not Applicable  Medication Review Press photographer) Complete  Some recent data might be hidden

## 2020-05-27 NOTE — Progress Notes (Signed)
SATURATION QUALIFICATIONS: (This note is used to comply with regulatory documentation for home oxygen)  Patient Saturations on Room Air at Rest = 89%  Patient Saturations on Room Air while Ambulating = 83%  Patient Saturations on 3 Liters of oxygen while Ambulating = 94%  Please briefly explain why patient needs home oxygen:  Pt's oxygen dropped to 83% on room air while ambulating to bathroom; pt complained of SOB and had increase work of breathing.

## 2020-05-27 NOTE — Progress Notes (Addendum)
Subjective:  Feels better with breathing. Rested well last night. No abdominal pain. Stools are brown.   Objective: Vital signs in last 24 hours: Temp:  [98.6 F (37 C)-100 F (37.8 C)] 100 F (37.8 C) (09/01 2109) Pulse Rate:  [83-88] 88 (09/01 2109) Resp:  [18-20] 20 (09/01 2109) BP: (153-156)/(49-56) 153/49 (09/01 2109) SpO2:  [89 %-94 %] 90 % (09/01 2109) Last BM Date: 05/26/20 General:   Alert,  Well-developed, well-nourished, pleasant and cooperative in NAD. No sob with conversation. Head:  Normocephalic and atraumatic. Eyes:  Sclera clear, no icterus.  Chest: diffuse scattered wheezing Heart:  Regular rate and rhythm; no murmurs, clicks, rubs,  or gallops. Abdomen:  Soft, nontender and nondistended.   Normal bowel sounds, without guarding, and without rebound.   Neurologic:  Alert and  oriented x4;  grossly normal neurologically. Skin:  Intact without significant lesions or rashes. Psych:  Alert and cooperative. Normal mood and affect.  Intake/Output from previous day: 09/01 0701 - 09/02 0700 In: 350 [IV Piggyback:350] Out: 175 [Urine:175] Intake/Output this shift: No intake/output data recorded.  Lab Results: CBC Recent Labs    05/25/20 0959 05/26/20 0558 05/27/20 0550  WBC 9.5 10.2 10.5  HGB 8.4* 7.8* 7.8*  HCT 28.0* 26.6* 26.4*  MCV 98.2 97.4 96.0  PLT 291 292 333   BMET Recent Labs    05/26/20 0558 05/27/20 0550  NA 139 140  K 4.6 4.2  CL 107 110  CO2 20* 20*  GLUCOSE 209* 111*  BUN 65* 63*  CREATININE 4.15* 3.93*  CALCIUM 8.6* 8.6*   LFTs No results for input(s): BILITOT, BILIDIR, IBILI, ALKPHOS, AST, ALT, PROT, ALBUMIN in the last 72 hours. No results for input(s): LIPASE in the last 72 hours. PT/INR No results for input(s): LABPROT, INR in the last 72 hours.    Imaging Studies: DG Chest 2 View  Result Date: 05/26/2020 CLINICAL DATA:  SOB with cough. EXAM: CHEST - 2 VIEW COMPARISON:  05/24/2020 FINDINGS: Left chest wall pacer is noted  with leads in the right atrial appendage, right ventricle and coronary sinus. Stable cardiomediastinal contours. Extensive airspace disease within the right upper lobe is again noted and appears unchanged. Left lung is clear. No pleural effusion or edema identified. IMPRESSION: 1. No change in right upper lobe pneumonia. Electronically Signed   By: Kerby Moors M.D.   On: 05/26/2020 10:40   DG Chest 2 View  Result Date: 05/24/2020 CLINICAL DATA:  Shortness of breath. EXAM: CHEST - 2 VIEW COMPARISON:  May 22, 2020. FINDINGS: Stable cardiomegaly. No pneumothorax or pleural effusion is noted. Left-sided pacemaker is unchanged in position. Left lung is clear. Increased right upper lobe airspace opacity is noted concerning for worsening pneumonia. Bony thorax is unremarkable. IMPRESSION: Worsening right upper lobe pneumonia. Electronically Signed   By: Marijo Conception M.D.   On: 05/24/2020 10:00   US Venous Img Lower Bilateral (DVT)  Result Date: 05/23/2020 CLINICAL DATA:  History of pulmonary embolism. Evaluate for lower extremity DVT. EXAM: BILATERAL LOWER EXTREMITY VENOUS DOPPLER ULTRASOUND TECHNIQUE: Gray-scale sonography with graded compression, as well as color Doppler and duplex ultrasound were performed to evaluate the lower extremity deep venous systems from the level of the common femoral vein and including the common femoral, femoral, profunda femoral, popliteal and calf veins including the posterior tibial, peroneal and gastrocnemius veins when visible. The superficial great saphenous vein was also interrogated. Spectral Doppler was utilized to evaluate flow at rest and with distal augmentation maneuvers in  the common femoral, femoral and popliteal veins. COMPARISON:  12/22/2019 FINDINGS: RIGHT LOWER EXTREMITY Common Femoral Vein: No evidence of thrombus. Normal compressibility, respiratory phasicity and response to augmentation. Saphenofemoral Junction: No evidence of thrombus. Normal  compressibility and flow on color Doppler imaging. Profunda Femoral Vein: No evidence of thrombus. Normal compressibility and flow on color Doppler imaging. Femoral Vein: No evidence of thrombus. Normal compressibility, respiratory phasicity and response to augmentation. Popliteal Vein: No evidence of thrombus. Normal compressibility, respiratory phasicity and response to augmentation. Calf Veins: Visualized right deep calf veins are patent without thrombus. Limited evaluation. Superficial Great Saphenous Vein: No evidence of thrombus. Normal compressibility. Other Findings:  None. LEFT LOWER EXTREMITY Common Femoral Vein: No evidence of thrombus. Normal compressibility, respiratory phasicity and response to augmentation. Saphenofemoral Junction: No evidence of thrombus. Normal compressibility and flow on color Doppler imaging. Profunda Femoral Vein: No evidence of thrombus. Normal compressibility and flow on color Doppler imaging. Femoral Vein: No evidence of thrombus. Normal compressibility, respiratory phasicity and response to augmentation. Popliteal Vein: No evidence of thrombus. Normal compressibility, respiratory phasicity and response to augmentation. Calf Veins: Visualized left deep calf veins are patent without thrombus. Limited evaluation. Superficial Great Saphenous Vein: No evidence of thrombus. Normal compressibility. Other Findings:  None. IMPRESSION: No evidence of deep venous thrombosis in either lower extremity. Electronically Signed   By: Markus Daft M.D.   On: 05/23/2020 12:21   DG Chest Portable 1 View  Result Date: 05/22/2020 CLINICAL DATA:  Shortness of breath, hypoxemia, end-stage renal disease on dialysis, LEFT-sided crackles, RIGHT-side week EXAM: PORTABLE CHEST 1 VIEW COMPARISON:  Portable exam 1042 hours compared to 12/21/2019 FINDINGS: LEFT subclavian sequential pacemaker with leads projecting over RIGHT atrium, RIGHT ventricle, and coronary sinus. Enlargement of cardiac silhouette  with vascular congestion. Minimal LEFT base atelectasis. RIGHT perihilar opacities extending into RIGHT upper lobe favor pneumonia although requiring follow-up until resolution to exclude underlying nodule. Question mild enlargement of RIGHT hilum versus perihilar infiltrate. Remaining lungs clear. No pleural effusion or pneumothorax. Bones demineralized. IMPRESSION: Enlargement of cardiac silhouette post pacemaker. Questionable RIGHT hilar enlargement/adenopathy versus perihilar infiltrate. RIGHT upper lobe pneumonia; follow-up exams until resolution recommended to exclude underlying pulmonary mass/nodule and hilar adenopathy. Electronically Signed   By: Lavonia Dana M.D.   On: 05/22/2020 11:16   VAS [place] UPPER EXTREMITY ARTERIAL DUPLEX  Result Date: 04/29/2020 UPPER EXTREMITY DUPLEX STUDY Indications: Patient complains of HD access pre op.  Performing Technologist: Ralene Cork RVT  Examination Guidelines: A complete evaluation includes B-mode imaging, spectral Doppler, color Doppler, and power Doppler as needed of all accessible portions of each vessel. Bilateral testing is considered an integral part of a complete examination. Limited examinations for reoccurring indications may be performed as noted.  Right Pre-Dialysis Findings: +-----------------------+----------+--------------------+---------+--------+ Location               PSV (cm/s)Intralum. Diam. (cm)Waveform Comments +-----------------------+----------+--------------------+---------+--------+ Brachial Antecub. fossa98        0.50                triphasic         +-----------------------+----------+--------------------+---------+--------+ Radial Art at Wrist    76        0.19                triphasic         +-----------------------+----------+--------------------+---------+--------+ Ulnar Art at Wrist     66        0.16  triphasic         +-----------------------+----------+--------------------+---------+--------+  Left Pre-Dialysis Findings: +-----------------------+----------+--------------------+---------+--------+ Location               PSV (cm/s)Intralum. Diam. (cm)Waveform Comments +-----------------------+----------+--------------------+---------+--------+ Brachial Antecub. fossa81        0.52                triphasic         +-----------------------+----------+--------------------+---------+--------+ Radial Art at Wrist    58        0.21                triphasic         +-----------------------+----------+--------------------+---------+--------+ Ulnar Art at Wrist     61        0.15                triphasic         +-----------------------+----------+--------------------+---------+--------+  Summary:  Right: Patent brachial, radial, and ulnar arteries with calcified        walls in the radial and ulnar arteries. Left: Patent brachial, radial, and ulnar arteries with calcified       walls in the radial and ulnar arteries. *See table(s) above for measurements and observations. Electronically signed by Ruta Hinds MD on 04/29/2020 at 11:57:00 AM.    Final    ECHOCARDIOGRAM COMPLETE  Result Date: 05/23/2020    ECHOCARDIOGRAM REPORT   Patient Name:   SHANEE BATCH Date of Exam: 05/23/2020 Medical Rec #:  [id number]          Height:       63.0 in Accession #:    [id number]         Weight:       233.0 lb Date of Birth:  June 29, 1941         BSA:          2.063 m Patient Age:    79 years           BP:           148/63 mmHg Patient Gender: F                  HR:           83 bpm. Exam Location:  [facility] Procedure: 2D Echo, Cardiac Doppler and Color Doppler Indications:    Dyspnea  History:        Patient has prior history of Echocardiogram examinations, most                 recent 12/23/2019. CHF, Pacemaker, Mitral Valve Disease,                 Signs/Symptoms:Shortness of Breath; Risk Factors:Diabetes and                 Hypertension. Sepsis, pneumonia.  Sonographer:    Dustin Flock RDCS  Referring Phys: Ethel  1. Left ventricular ejection fraction, by estimation, is 60 to 65%. The left ventricle has normal function. The left ventricle has no regional wall motion abnormalities. There is moderate left ventricular hypertrophy. Left ventricular diastolic parameters are consistent with Grade I diastolic dysfunction (impaired relaxation).  2. Right ventricular systolic function is normal. The right ventricular size is normal. There is severely elevated pulmonary artery systolic pressure. The estimated right ventricular systolic pressure is 64.4 mmHg. Device wire present.  3. Left atrial size was mildly dilated.  4. The mitral valve is abnormal, mildly calcified and with moderate  annular calcification. Mild mitral valve regurgitation.  5. The aortic valve is tricuspid. Aortic valve regurgitation is not visualized.  6. The inferior vena cava is dilated in size with <50% respiratory variability, suggesting right atrial pressure of 15 mmHg. FINDINGS  Left Ventricle: Left ventricular ejection fraction, by estimation, is 60 to 65%. The left ventricle has normal function. The left ventricle has no regional wall motion abnormalities. The left ventricular internal cavity size was normal in size. There is  moderate left ventricular hypertrophy. Left ventricular diastolic parameters are consistent with Grade I diastolic dysfunction (impaired relaxation). Right Ventricle: The right ventricular size is normal. No increase in right ventricular wall thickness. Right ventricular systolic function is normal. There is severely elevated pulmonary artery systolic pressure. The tricuspid regurgitant velocity is 3.49 m/s, and with an assumed right atrial pressure of 15 mmHg, the estimated right ventricular systolic pressure is 11.5 mmHg. Left Atrium: Left atrial size was mildly dilated. Right Atrium: Right atrial size was normal in size. Pericardium: There is no evidence of pericardial effusion.  Mitral Valve: The mitral valve is abnormal. There is mild calcification of the mitral valve leaflet(s). Moderate mitral annular calcification. Mild mitral valve regurgitation. Tricuspid Valve: The tricuspid valve is grossly normal. Tricuspid valve regurgitation is mild. Aortic Valve: The aortic valve is tricuspid. Aortic valve regurgitation is not visualized. Mild aortic valve annular calcification. Pulmonic Valve: The pulmonic valve was grossly normal. Pulmonic valve regurgitation is mild. Aorta: The aortic root is normal in size and structure. Venous: The inferior vena cava is dilated in size with less than 50% respiratory variability, suggesting right atrial pressure of 15 mmHg. IAS/Shunts: No atrial level shunt detected by color flow Doppler. Additional Comments: A pacer wire is visualized.  LEFT VENTRICLE PLAX 2D LVIDd:         4.24 cm  Diastology LVIDs:         3.01 cm  LV e' lateral:   3.59 cm/s LV PW:         1.50 cm  LV E/e' lateral: 33.4 LV IVS:        1.52 cm  LV e' medial:    5.44 cm/s LVOT diam:     2.00 cm  LV E/e' medial:  22.1 LV SV:         93 LV SV Index:   45 LVOT Area:     3.14 cm  RIGHT VENTRICLE RV Basal diam:  3.78 cm RV S prime:     9.90 cm/s TAPSE (M-mode): 3.2 cm LEFT ATRIUM             Index       RIGHT ATRIUM           Index LA diam:        4.30 cm 2.08 cm/m  RA Area:     21.90 cm LA Vol (A2C):   52.2 ml 25.30 ml/m RA Volume:   67.60 ml  32.76 ml/m LA Vol (A4C):   74.4 ml 36.06 ml/m LA Biplane Vol: 69.1 ml 33.49 ml/m  AORTIC VALVE LVOT Vmax:   130.00 cm/s LVOT Vmean:  96.800 cm/s LVOT VTI:    0.295 m  AORTA Ao Root diam: 3.00 cm MITRAL VALVE                TRICUSPID VALVE MV Area (PHT): 3.28 cm     TR Peak grad:   48.7 mmHg MV Decel Time: 231 msec     TR Vmax:  349.00 cm/s MV E velocity: 120.00 cm/s MV A velocity: 125.00 cm/s  SHUNTS MV E/A ratio:  0.96         Systemic VTI:  0.30 m                             Systemic Diam: 2.00 cm Rozann Lesches MD Electronically signed  by Rozann Lesches MD Signature Date/Time: 05/23/2020/11:07:51 AM    Final    VAS [place] UPPER EXTREMITY VEIN MAPPING  Result Date: 04/29/2020 UPPER EXTREMITY VEIN MAPPING  Indications: Pre-access. Performing Technologist: Ralene Cork RVT  Examination Guidelines: A complete evaluation includes B-mode imaging, spectral Doppler, color Doppler, and power Doppler as needed of all accessible portions of each vessel. Bilateral testing is considered an integral part of a complete examination. Limited examinations for reoccurring indications may be performed as noted. +-----------------+-------------+----------+--------+ Right Cephalic   Diameter (cm)Depth (cm)Findings +-----------------+-------------+----------+--------+ Shoulder             0.55                        +-----------------+-------------+----------+--------+ Prox upper arm       0.53                        +-----------------+-------------+----------+--------+ Mid upper arm        0.48                        +-----------------+-------------+----------+--------+ Dist upper arm       0.47                        +-----------------+-------------+----------+--------+ Antecubital fossa    0.66                        +-----------------+-------------+----------+--------+ Prox forearm         0.34                        +-----------------+-------------+----------+--------+ Mid forearm          0.30                        +-----------------+-------------+----------+--------+ Dist forearm         0.24               lateral  +-----------------+-------------+----------+--------+ +-----------------+-------------+----------+--------+ Right Basilic    Diameter (cm)Depth (cm)Findings +-----------------+-------------+----------+--------+ Mid upper arm        0.43                        +-----------------+-------------+----------+--------+ Dist upper arm       0.52                         +-----------------+-------------+----------+--------+ Antecubital fossa    0.28                        +-----------------+-------------+----------+--------+ +-----------------+-------------+----------+--------------+ Left Cephalic    Diameter (cm)Depth (cm)   Findings    +-----------------+-------------+----------+--------------+ Shoulder             0.28                              +-----------------+-------------+----------+--------------+  Prox upper arm       0.18                              +-----------------+-------------+----------+--------------+ Mid upper arm        0.18                              +-----------------+-------------+----------+--------------+ Dist upper arm       0.22                              +-----------------+-------------+----------+--------------+ Antecubital fossa    0.25                              +-----------------+-------------+----------+--------------+ Prox forearm         0.16                              +-----------------+-------------+----------+--------------+ Mid forearm          0.12                              +-----------------+-------------+----------+--------------+ Dist forearm                            not visualized +-----------------+-------------+----------+--------------+ +-----------------+-------------+----------+--------+ Left Basilic     Diameter (cm)Depth (cm)Findings +-----------------+-------------+----------+--------+ Mid upper arm        0.38                        +-----------------+-------------+----------+--------+ Dist upper arm       0.48                        +-----------------+-------------+----------+--------+ Antecubital fossa    0.25                        +-----------------+-------------+----------+--------+ Summary: Right: Patent cephalic and basilic veins. Left: Patent cephalic and basilic veins. *See table(s) above for measurements and observations.  Diagnosing  physician: Ruta Hinds MD Electronically signed by Ruta Hinds MD on 04/29/2020 at 11:57:08 AM.    Final   [2 weeks]   Assessment:  Pleasant 79 year old female with multiple comorbidities including severe pulmonary hypertension, heart failure, complete heart block status post pacemaker placement, PE in March 2021, presenting with acute respiratory failure, sepsis secondary to pneumonia (suspected aspiration given recent MAC anesthesia).  She was found to have dark, heme positive stools after starting Eliquis which had been on hold for AV fistula creation.  Acute blood loss anemia with hemoglobin historically in the 10/11 range, 9.1 on admission.  Slowly trended downward to 7.8 and stable last 24 hours, but no overt GI bleeding.  Complains of blood-tinged mucus with blowing her nose but does not appear to be significant.  Eliquis remains on hold.  Chronic kidney disease, creatinine up to 4.15, down to 3.93 today.  Typically in the 3 range.  She is in the process of being prepared for possible dialysis in the future.  Respiratory status, initially requiring 10L of oxygen.  Was down to 5L yesterday. Over the past 24 hours she has been on  2-3 liters via [place].  Followed by pulmonology, reporting okay to proceed with EGD once oxygen requirements are minimal.  Chest x-ray yesterday with no change in right upper lobe pneumonia compared to previous exam on August 30. 95% on 3 liters this morning. Has not been a candidate for endoscopic procedures from a respiratory standpoint and oxygen requirement significantly improved.  Discussed with Dr. Denton Brick today. Patient is improved from respiratory standpoint but there is concerns for possible repeat aspiration and he is recommended outpatient EGD in light of stable H/H, no plans for Eliquis, and to allow more time for respiratory recovery. Patient will likely be discharged tomorrow.    Plan: 1. Monitor H/H. 2. PPI BID. 3. Would recommend follow H/H in 1-2  weeks as outpatient.  4. Follow up office visit to arrange for EGD once patient more appropriate.     Laureen Ochs. Bernarda Caffey [facility] [phone] 9/2/202110:35 AM     LOS: 5 days

## 2020-05-27 NOTE — Telephone Encounter (Signed)
Patient needs OV in 2-3 weeks for hospital follow up.  Needs CBC week of 9/13.

## 2020-05-28 ENCOUNTER — Inpatient Hospital Stay (HOSPITAL_COMMUNITY): Payer: Medicare HMO

## 2020-05-28 LAB — GLUCOSE, CAPILLARY
Glucose-Capillary: 126 mg/dL — ABNORMAL HIGH (ref 70–99)
Glucose-Capillary: 160 mg/dL — ABNORMAL HIGH (ref 70–99)

## 2020-05-28 LAB — CBC
HCT: 28.4 % — ABNORMAL LOW (ref 36.0–46.0)
Hemoglobin: 8.5 g/dL — ABNORMAL LOW (ref 12.0–15.0)
MCH: 28.5 pg (ref 26.0–34.0)
MCHC: 29.9 g/dL — ABNORMAL LOW (ref 30.0–36.0)
MCV: 95.3 fL (ref 80.0–100.0)
Platelets: 378 10*3/uL (ref 150–400)
RBC: 2.98 MIL/uL — ABNORMAL LOW (ref 3.87–5.11)
RDW: 14.6 % (ref 11.5–15.5)
WBC: 9.9 10*3/uL (ref 4.0–10.5)
nRBC: 0 % (ref 0.0–0.2)

## 2020-05-28 LAB — BASIC METABOLIC PANEL
Anion gap: 11 (ref 5–15)
BUN: 63 mg/dL — ABNORMAL HIGH (ref 8–23)
CO2: 20 mmol/L — ABNORMAL LOW (ref 22–32)
Calcium: 8.9 mg/dL (ref 8.9–10.3)
Chloride: 108 mmol/L (ref 98–111)
Creatinine, Ser: 3.7 mg/dL — ABNORMAL HIGH (ref 0.44–1.00)
GFR calc Af Amer: 13 mL/min — ABNORMAL LOW (ref >60)
GFR calc non Af Amer: 11 mL/min — ABNORMAL LOW (ref >60)
Glucose, Bld: 128 mg/dL — ABNORMAL HIGH (ref 70–99)
Potassium: 4.3 mmol/L (ref 3.5–5.1)
Sodium: 139 mmol/L (ref 135–145)

## 2020-05-28 MED ORDER — CEFDINIR 300 MG PO CAPS: 300.0000 mg | ORAL_CAPSULE | Freq: Every day | ORAL | 0 refills | Status: AC

## 2020-05-28 MED ORDER — GUAIFENESIN ER 600 MG PO TB12
600.0000 mg | ORAL_TABLET | Freq: Two times a day (BID) | ORAL | 0 refills | Status: DC
Start: 2020-05-28 — End: 2020-06-28

## 2020-05-28 MED ORDER — FUROSEMIDE 40 MG PO TABS
40.0000 mg | ORAL_TABLET | Freq: Every day | ORAL | 3 refills | Status: DC
Start: 2020-05-28 — End: 2020-06-09

## 2020-05-28 MED ORDER — PANTOPRAZOLE SODIUM 40 MG PO TBEC: 40.0000 mg | DELAYED_RELEASE_TABLET | Freq: Every day | ORAL | 3 refills | Status: AC

## 2020-05-28 MED ORDER — ALBUTEROL SULFATE (2.5 MG/3ML) 0.083% IN NEBU: 2.5000 mg | INHALATION_SOLUTION | RESPIRATORY_TRACT | 12 refills | Status: AC | PRN

## 2020-05-28 MED ORDER — CARVEDILOL 6.25 MG PO TABS: 6.2500 mg | ORAL_TABLET | Freq: Two times a day (BID) | ORAL | 5 refills | Status: AC

## 2020-05-28 MED ORDER — AMLODIPINE BESYLATE 10 MG PO TABS
10.0000 mg | ORAL_TABLET | Freq: Every day | ORAL | 5 refills | Status: DC
Start: 2020-05-28 — End: 2020-06-09

## 2020-05-28 MED ORDER — ISOSORBIDE MONONITRATE ER 30 MG PO TB24: 30.0000 mg | ORAL_TABLET | Freq: Every day | ORAL | 11 refills | Status: AC

## 2020-05-28 MED ORDER — AZITHROMYCIN 500 MG PO TABS: 500.0000 mg | ORAL_TABLET | Freq: Every day | ORAL | 0 refills | Status: AC

## 2020-05-28 NOTE — Care Management Important Message (Signed)
Important Message  Patient Details  Name: Cassandra Parker MRN: [id number] Date of Birth: 1941-01-13   Medicare Important Message Given:  Yes     Tommy Medal 05/28/2020, 2:01 PM

## 2020-05-28 NOTE — Progress Notes (Signed)
Discharge instructions reviewed with patient, daughter at bedside. Given AVS, prescriptions sent to her pharmacy by provider, verbalized understanding of instructions, follow-up appointments, prescriptions, daily weights, fluid restriction, when to seek medical care. States they will call to schedule follow-up appointments. IV site removed, site within normal limits. Patient left floor in stable condition via w/c accompanied by nursing staff. Discharged home.

## 2020-05-28 NOTE — Discharge Summary (Signed)
Cassandra Parker, is a 79 y.o. female  DOB 04-May-1941  MRN [id number].  Admission date:  05/22/2020  Admitting Physician  Roxan Hockey, MD  Discharge Date:  05/28/2020   Primary MD  Quintin Alto, [name], MD  Recommendations for primary care physician for things to follow:   1)Very low-salt diet advised 2)Weigh yourself daily, call if you gain more than 3 pounds in 1 day or more than 5 pounds in 1 week as your diuretic medications may need to be adjusted 3)Limit your Fluid  intake to no more than 50 ounces (1.5 Liters) per day 4)Avoid ibuprofen/Advil/Aleve/Motrin/Goody Powders/Naproxen/BC powders/Meloxicam/Diclofenac/Indomethacin and other Nonsteroidal anti-inflammatory medications as these will make you more likely to bleed and can cause stomach ulcers, can also cause Kidney problems.  5)Follow - up with Dr. Kara Mead Pulmonologist in [place] further evaluation for possible obstructive sleep apnea and management of severe pulmonary hypertension -Address: [address] [place], [place] [address] Phone: [phone] OR call [phone] -Patient needs to be seen in the [place] office, Not in [place]  6) follow-up with Dr. Lavone Neri --PA--- the GI physician/gastroenterologist at [facility] in [place] in 2 to 4 weeks for recheck and reevaluation and possible endoscopy  7)you need oxygen at home at 3 L via nasal cannula continuously while awake and while asleep--- smoking or having open fires around oxygen can cause fire, significant injury and death  10-25-22) follow-up with your nephrologist/kidney doctor within a week for repeat CBC and BMP blood test  Admission Diagnosis  Respiratory failure with hypoxia ([place]) [J96.91] Gastrointestinal hemorrhage, unspecified gastrointestinal hemorrhage type [K92.2] Community acquired pneumonia of right upper lobe of lung  [J18.9] Acute on chronic congestive heart failure, unspecified heart failure type ([place]) [I50.9]   Discharge Diagnosis  Respiratory failure with hypoxia ([place]) [J96.91] Gastrointestinal hemorrhage, unspecified gastrointestinal hemorrhage type [K92.2] Community acquired pneumonia of right upper lobe of lung [J18.9] Acute on chronic congestive heart failure, unspecified heart failure type ([place]) [I50.9]    Principal Problem:   Sepsis due to PNA Active Problems:   Cardiomyopathy- EF 35-40% (etiology not yet determined)   Respiratory failure with hypoxia ([place])   Rt U L Pneumonia   GI bleed   HTN (hypertension)   Diabetes mellitus type 2, controlled, without complications ([place])   Cancer of breast- s/p chemo and Rt mastectomy 2008   Mitral stenosis- moderate with trivial MR    Severe Pulmonary HTN--      Past Medical History:  Diagnosis Date   Acute renal insufficiency 10/15/2013   Arthritis    Cancer ([place]) 2008   right breast   Cancer of breast- s/p chemo and Rt mastectomy 2008 10/13/2013   Cardiomyopathy ([place])    dating back to 2010   CHB (complete heart block) ([place]) 10/13/2013   Chronic combined systolic and diastolic congestive heart failure ([place])    Diabetes mellitus    with neuropathy   Diabetes mellitus type 2, controlled, without complications ([place]) 4/33/2951   Dyslipidemia 10/13/2013   History of  blood transfusion    Hypercholesterolemia    Hypertension    Mitral stenosis- moderate with trivial MR  10/15/2013   Pacemaker 10/23/2014   Pneumonia    Presence of permanent cardiac pacemaker    [employer]   Primary osteoarthritis of left knee 11/04/2015   Syncope 10/13/2013   Tingling    feet bilat     Past Surgical History:  Procedure Laterality Date   APPENDECTOMY     mmh   AV FISTULA PLACEMENT Right 05/18/2020   Procedure: RIGHT ARM BRACHIOCEPHALIC ARTERIOVENOUS (AV) FISTULA CREATION;  Surgeon: Elam Dutch, MD;  Location: [facility];  Service:  Vascular;  Laterality: Right;   BI-VENTRICULAR PACEMAKER INSERTION N/A 10/15/2013   Procedure: BI-VENTRICULAR PACEMAKER INSERTION (CRT-P);  Surgeon: Evans Lance, MD;  Location: [facility] CATH LAB;  Service: Cardiovascular;  Laterality: N/A;   Cardiomyopathy     CATARACT EXTRACTION W/PHACO  06/29/2011   Procedure: CATARACT EXTRACTION PHACO AND INTRAOCULAR LENS PLACEMENT (IOC);  Surgeon: Tonny Branch;  Location: [facility];  Service: Ophthalmology;  Laterality: Right;  CDE: 14.41   CATARACT EXTRACTION W/PHACO  09/04/2011   Procedure: CATARACT EXTRACTION PHACO AND INTRAOCULAR LENS PLACEMENT (IOC);  Surgeon: Tonny Branch;  Location: [facility];  Service: Ophthalmology;  Laterality: Left;  CDE: 15.21   CHOLECYSTECTOMY     mmh   JOINT REPLACEMENT Left    knee   KNEE ARTHROSCOPY     right-mmh   MASTECTOMY  2008    mmh-right   TEMPORARY PACEMAKER INSERTION N/A 10/13/2013   Procedure: TEMPORARY PACEMAKER INSERTION;  Surgeon: Jolaine Artist, MD;  Location: [facility] CATH LAB;  Service: Cardiovascular;  Laterality: N/A;   TOTAL KNEE ARTHROPLASTY Left 11/04/2015   Procedure: LEFT TOTAL KNEE ARTHROPLASTY;  Surgeon: Rod Can, MD;  Location: [facility];  Service: Orthopedics;  Laterality: Left;     HPI  from the history and physical done on the day of admission:    Cassandra Parker  is a 79 y.o. female with CKDIV, s/p right mastectomy and presumably axillary node dissection, s/p pacemaker on the left side, s/p  AV fistula creation on 05/18/2020, HTN, DM2, HLD, History of Complete Heart Block s/p Pacemaker placement, HFpEF, chronic Anemia and h/o PE (11/2019) on Eliquis presenting with hypoxia and shob--  -Patient states that she was holding her Eliquis from 05/14/2020 through 07/18/2020 inclusive to allow for right upper extremity AV fistula creation, she restarted Eliquis on 05/19/2020 -On 05/21/2020 she started to notice dark stools, had right thigh area pain and lower abdominal discomfort -No bright red blood per  rectum no hematemesis no hematochezia -Shortness of breath got worse, she was found to be hypoxic by EMS and today in the ED here In ED--chest x-ray suggestive of right upper lobe pneumonia in the setting of recent anesthesia/sedation for AV fistula creation on 05/18/2020 raises concern about possible aspiration -WBC 11.9, no fevers no chills, no vomiting, patient has chronic loose stools -She is found to be hypoxic with O2 sats of 68 % on room air, currently on 4 to 5 L of oxygen---  --previously no hypoxia and patient is not on home O2  Troponin is 114>> 117--- this is consistent with patient prior baseline --- bnp 1,462 no baseline available hgb 9.1 (baseline 10 to 11) Creatinine 3.5 --close to baseline -Patient met sepsis criteria on admission with finding of right-sided pneumonia, leukocytosis,  tachypnea, and hypoxia      Hospital Course:   Brief Summary:- 79 y.o.femalewithCKDIV, s/pright mastectomy and presumably  axillary node dissection, s/ppacemaker on the left side, s/pAV fistulacreation on 05/18/2020, HTN, DM2, HLD,History of Complete Heart Block s/p Pacemaker placement, HFpEF, chronic Anemia and h/o PE (11/2019) on Eliquis admitted with acute hypoxic respiratory failure secondary to pneumonia with echo showing severe pulmonary hypertension   A/p 1)Sepsis Secondary toPneumonia---- --most likely aspiration related post anesthesia Patient met sepsis criteria on admission with finding of right-sided pneumonia, leukocytosis,tachypnea, and hypoxia -No tachycardia most likely due to carvedilol use -Lactic acid was not elevated -WBC 11.9 >> 10.5 >>9.5>>10.2>.10.5 Repeat CXR on 05/26/20 with persistent right-sided pneumonia -Treated with IV Rocephin and azithromycin,mucolytics and supplemental oxygen as ordered 05/27/20-O2 sats 93% on 3 L  - Repeat chest x-ray on 05/28/2020 is Much Improved  2)GI bleed---patient with dark stools after restarting Eliquis, --Hgb 10.5 >>  9.1 >>8.0 >>8.6>>8.2 >>8.4>>7.8>>7.8>>8.5 ( baseline that is usually close to 11) -c/n Protonix -- stool for occult blood -- Positive -Stopped Eliquis -GI consult from Dr. Gala Romney appreciated for possible endoluminal evaluation -Respiratory status precludes endoluminal evaluation/sedation at this time -H&H appears to have stabilized after stopping Eliquis And  after initial drop -Had light brown stool on 05/27/2020 -Plan is for outpatient endoluminal evaluation once respiratory status improves further   3)HFpEF/chronic diastolic dysfunction CHF/pulmonary hypertension--elevated BNP noted, -Echo from 05/23/2020 with EF of 60 to 65% with severe pulmonary hypertension and grade 1 diastolic dysfunction -PCCM consult requested for severe pulmonary hypertension -Troponinis 114>> 117---this is consistent with patient prior baseline ---bnp 1,462no baseline available --Chest x-ray without overt failure, hypoxia most likely from #1 above -c/nCoreg -Developed AKI on CKD with IV Lasix, give diuretic holiday on 05/26/2020 resume p.o. Lasix 40 mg on 05/27/2020 -Chest x-ray on 05/26/2020 without fluid overload -Repeat chest x-ray on 05/28/2020  4)Acute hypoxic respiratory failure/severe pulmonary hypertension ---likely secondary to pneumonia as above #1 --She is found to be hypoxic with O2 satsof 68 %on room air, currently on 6 L of oxygen---  --previously no hypoxia and PTA patient was not on home O2 ---Manage as above #1 -PCCM consult appreciated --Anticipate patient will probably be discharged home on home O2 -Oxygen titrated down to 3 L from 10 L/min couple days ago  5)RUL PNA-- CAP---suspect aspiration related,chest x-ray suggestive of right upper lobe pneumonia in the setting of recent anesthesia/sedation for AV fistula creation on 05/18/2020 raises concern about possible aspiration -Repeat CXR on 05/26/20 with persistent right upper lobe pneumonia --Manage as above #1  6)H/oPE--diagnosed in  March 2021 with VQ scan that was intermediate in the setting of elevated D-dimer -Patient has severe pulmonary hypertension-PCCM consult requested for severe pulmonary hypertension -Patient was unable to do CTA chest at that time due to kidney concerns -He has been compliant with Eliquis except for holding her Eliquis from 05/14/2020 through 07/18/2020 inclusive to allow for right upper extremity AV fistula creation,she restarted Eliquis on 05/19/2020 ---Given dark stools and drop in H&H Eliquis was stopped  --lower extremity venous Dopplers--without acute DVT Discussed Risk Versus Benefits of Full anti-coagulation with pt and daughter ---Pt already completed more than 5 months of Eliquis for PE (dxed by intermediate VQ scan) and they do NOT wish to continue Full anti-coagulation.   7)History of Complete Heart Block s/p Pacemaker placement -EKG shows paced rhythm. Stable. Routine follow-up as outpatient.  8)AKI on CKD IV--creatinine up to 4.15 from 3.5 after IV Lasix was given on 05/25/2020  -Creatinine trended back down, currently 3.70 - restarted oral Lasix on 05/27/2020 --Status post AV fistula creation on 05/18/2020 in anticipation  of HD in the near term --renally adjust medications, avoid nephrotoxic agents / dehydration / hypotension  9)Diabetes mellitus type 2   restart glipizide as patient is tolerating oral intake   10)History ofRt Breast cancer -S/p chemo and Rt mastectomy 2008 Status post right-sided mastectomy and chemotherapy -Stable, follow-up as outpatient  11)Acute on chronicAnemia--at baseline patient has anemia of CKD -Suspect worsening anemia with hemoglobin  --Hgb 10.5 >> 9.1 >>8.0 >>8.6>>8.2>.8.4>.7.8>.8.5 --baseline close to 11 is due to acute GI bleed/ABLA -Protonix as above #2, GI consult noted  12)severe pulmonary hypertension-PCCM consult requested for severe pulmonary hypertension -outpt follow up with Pulmonologist advised  code  Status:full  Family Communication: (patient is alert, awake and coherent) Previously discussed withdaughter    Disposition-- dc home with home oxygen Consults  :  Gi  Discharge Condition: stable  Follow UP   Follow-up Information    Mahala Menghini, PA-C On 07/21/2020.   Specialty: Gastroenterology Why: at 9:30 am Contact information: [address] [place] [address] [phone]        [facility] Follow up.   Why: Home oxygen and rollator walker.        [name], [name], MD Follow up.   Specialty: Family Medicine Contact information: [place] [address] [phone]        Evans Lance, MD .   Specialty: Cardiology Contact information: [place] [address] [phone]        Rigoberto Noel, MD. Schedule an appointment as soon as possible for a visit in 2 week(s).   Specialty: Pulmonary Disease Why: Patient needs to be seen in the [place] office for evaluation for possible obstructive sleep apnea and for management of severe pulmonary hypertension Contact information: [place] [address] [phone]               Consults obtained - pccm  Diet and Activity recommendation:  As advised  Discharge Instructions    Discharge Instructions    Call MD for:  difficulty breathing, headache or visual disturbances   Complete by: As directed    Call MD for:  persistant dizziness or light-headedness   Complete by: As directed    Call MD for:  persistant nausea and vomiting   Complete by: As directed    Call MD for:  severe uncontrolled pain   Complete by: As directed    Call MD for:  temperature >100.4   Complete by: As directed    Diet - low sodium heart healthy   Complete by: As directed    Discharge instructions   Complete by: As directed    1)Very low-salt diet advised 2)Weigh yourself daily, call if you gain more than 3 pounds in 1 day or more than 5 pounds in 1  week as your diuretic medications may need to be adjusted 3)Limit your Fluid  intake to no more than 50 ounces (1.5 Liters) per day 4)Avoid ibuprofen/Advil/Aleve/Motrin/Goody Powders/Naproxen/BC powders/Meloxicam/Diclofenac/Indomethacin and other Nonsteroidal anti-inflammatory medications as these will make you more likely to bleed and can cause stomach ulcers, can also cause Kidney problems.  5)Follow - up with Dr. Kara Mead Pulmonologist in [place] further evaluation for possible obstructive sleep apnea and management of severe pulmonary hypertension -Address: [address] [place], [place] [address] Phone: [phone] OR call [phone] -Patient needs to be seen in the [place] office, Not in [place]  6) follow-up with Dr. Gala Romney--- the GI physician/gastroenterologist at [facility] in [place] in 2 to 4 weeks for recheck and reevaluation and possible endoscopy  7)you need oxygen at home at 3 L via nasal cannula continuously while awake and while asleep--- smoking or having open fires around oxygen can cause fire, significant injury and death  8) follow-up with your nephrologist/kidney doctor within a week for repeat CBC and BMP blood test   For home use only DME Nebulizer machine   Complete by: As directed    Patient needs a nebulizer to treat with the following condition: Dyspnea and respiratory abnormalities   Length of Need: Lifetime   For home use only DME Nebulizer machine   Complete by: As directed    Patient needs a nebulizer to treat with the following condition: Dyspnea and respiratory abnormalities   Length of Need: Lifetime   Increase activity slowly   Complete by: As directed    No wound care   Complete by: As directed         Discharge Medications     Allergies as of 05/28/2020   No Known Allergies     Medication List    STOP taking these medications   apixaban 5 MG Tabs tablet Commonly known as: ELIQUIS     TAKE  these medications   acetaminophen 325 MG tablet Commonly known as: TYLENOL Take 2 tablets (650 mg total) by mouth every 6 (six) hours as needed for mild pain or headache (or Fever >/= 101).   albuterol (2.5 MG/3ML) 0.083% nebulizer solution Commonly known as: PROVENTIL Take 3 mLs (2.5 mg total) by nebulization every 2 (two) hours as needed for wheezing or shortness of breath.   amLODipine 10 MG tablet Commonly known as: NORVASC Take 1 tablet (10 mg total) by mouth daily. What changed: medication strength   azithromycin 500 MG tablet Commonly known as: ZITHROMAX Take 1 tablet (500 mg total) by mouth daily for 3 days.   carvedilol 6.25 MG tablet Commonly known as: COREG Take 1 tablet (6.25 mg total) by mouth 2 (two) times daily. What changed:   medication strength  how much to take   cefdinir 300 MG capsule Commonly known as: OMNICEF Take 1 capsule (300 mg total) by mouth daily for 3 days.   furosemide 40 MG tablet Commonly known as: LASIX Take 1 tablet (40 mg total) by mouth daily.   glipiZIDE 5 MG 24 hr tablet Commonly known as: GLUCOTROL XL Take 5 mg by mouth See admin instructions. Take 10 mg in the morning and 5 mg at night   guaiFENesin 600 MG 12 hr tablet Commonly known as: MUCINEX Take 1 tablet (600 mg total) by mouth 2 (two) times daily.   hydrALAZINE 50 MG tablet Commonly known as: APRESOLINE Take 50 mg by mouth 2 (two) times daily.   HYDROcodone-acetaminophen 5-325 MG tablet Commonly known as: Norco Take 1 tablet by mouth every 6 (six) hours as needed for moderate pain.   isosorbide mononitrate 30 MG 24 hr tablet Commonly known as: IMDUR Take 1 tablet (30 mg total) by mouth daily.   pantoprazole 40 MG tablet Commonly known as: PROTONIX Take 1 tablet (40 mg total) by mouth daily.   potassium chloride 10 MEQ tablet Commonly known as: KLOR-CON Take 10 mEq by mouth daily.   pravastatin 10 MG tablet Commonly known as: PRAVACHOL Take 10 mg by mouth  at bedtime.   vitamin B-12 1000 MCG tablet Commonly known as: CYANOCOBALAMIN Take 1,000 mcg by mouth daily.   Vitamin D 50 MCG (  2000 UT) tablet Take 2,000 Units by mouth daily.            Durable Medical Equipment  (From admission, onward)         Start     Ordered   05/28/20 1303  For home use only DME Nebulizer machine  Once       Comments: Dyspnea and congestive heart failure  Question Answer Comment  Patient needs a nebulizer to treat with the following condition Dyspnea and respiratory abnormalities   Length of Need Lifetime      05/28/20 1302   05/28/20 1146  For home use only DME 4 wheeled rolling walker with seat  Once       Question:  Patient needs a walker to treat with the following condition  Answer:  Acute exacerbation of CHF (congestive heart failure) ([place])   05/28/20 1145   05/28/20 1145  For home use only DME oxygen  Once       Comments: SATURATION QUALIFICATIONS: (This note is used to comply with regulatory documentation for home oxygen)   Patient Saturations on Room Air at Rest = 85 %   Patient Saturations on Room Air while Ambulating = 83 %   Patient Saturations on 3 Liters of oxygen while Ambulating = 93  %        Patient needs continuous O2 at 3 L/min continuously via nasal cannula with humidifier, with gaseous portability and conserving device   Diagnosis--- Congestive heart failure and pulmonary hypertension  Question Answer Comment  Length of Need Lifetime   Mode or (Route) Nasal cannula   Liters per Minute 3   Frequency Continuous (stationary and portable oxygen unit needed)   Oxygen conserving device Yes   Oxygen delivery system Gas      05/28/20 1144   05/28/20 0000  For home use only DME Nebulizer machine       Question Answer Comment  Patient needs a nebulizer to treat with the following condition Dyspnea and respiratory abnormalities   Length of Need Lifetime      05/28/20 1300   05/28/20 0000  For home use only DME Nebulizer  machine       Question Answer Comment  Patient needs a nebulizer to treat with the following condition Dyspnea and respiratory abnormalities   Length of Need Lifetime      05/28/20 1300          Major procedures and Radiology Reports - PLEASE review detailed and final reports for all details, in brief -   DG Chest 2 View  Result Date: 05/28/2020 CLINICAL DATA:  Shortness of breath. EXAM: CHEST - 2 VIEW COMPARISON:  05/26/2020 FINDINGS: The cardio pericardial silhouette is enlarged. Patchy airspace disease in the right upper lung appears mildly improved in the interval. Patchy retrocardiac airspace opacity at the left base noted on today's study. No pleural effusion. Telemetry leads overlie the chest. IMPRESSION: Interval improvement in patchy airspace disease in the right upper lung with retrocardiac patchy airspace disease noted at the left base. Electronically Signed   By: Misty Stanley M.D.   On: 05/28/2020 10:08   DG Chest 2 View  Result Date: 05/26/2020 CLINICAL DATA:  SOB with cough. EXAM: CHEST - 2 VIEW COMPARISON:  05/24/2020 FINDINGS: Left chest wall pacer is noted with leads in the right atrial appendage, right ventricle and coronary sinus. Stable cardiomediastinal contours. Extensive airspace disease within the right upper lobe is again noted and appears unchanged. Left lung is clear.  No pleural effusion or edema identified. IMPRESSION: 1. No change in right upper lobe pneumonia. Electronically Signed   By: Kerby Moors M.D.   On: 05/26/2020 10:40   DG Chest 2 View  Result Date: 05/24/2020 CLINICAL DATA:  Shortness of breath. EXAM: CHEST - 2 VIEW COMPARISON:  May 22, 2020. FINDINGS: Stable cardiomegaly. No pneumothorax or pleural effusion is noted. Left-sided pacemaker is unchanged in position. Left lung is clear. Increased right upper lobe airspace opacity is noted concerning for worsening pneumonia. Bony thorax is unremarkable. IMPRESSION: Worsening right upper lobe pneumonia.  Electronically Signed   By: Marijo Conception M.D.   On: 05/24/2020 10:00   US Venous Img Lower Bilateral (DVT)  Result Date: 05/23/2020 CLINICAL DATA:  History of pulmonary embolism. Evaluate for lower extremity DVT. EXAM: BILATERAL LOWER EXTREMITY VENOUS DOPPLER ULTRASOUND TECHNIQUE: Gray-scale sonography with graded compression, as well as color Doppler and duplex ultrasound were performed to evaluate the lower extremity deep venous systems from the level of the common femoral vein and including the common femoral, femoral, profunda femoral, popliteal and calf veins including the posterior tibial, peroneal and gastrocnemius veins when visible. The superficial great saphenous vein was also interrogated. Spectral Doppler was utilized to evaluate flow at rest and with distal augmentation maneuvers in the common femoral, femoral and popliteal veins. COMPARISON:  12/22/2019 FINDINGS: RIGHT LOWER EXTREMITY Common Femoral Vein: No evidence of thrombus. Normal compressibility, respiratory phasicity and response to augmentation. Saphenofemoral Junction: No evidence of thrombus. Normal compressibility and flow on color Doppler imaging. Profunda Femoral Vein: No evidence of thrombus. Normal compressibility and flow on color Doppler imaging. Femoral Vein: No evidence of thrombus. Normal compressibility, respiratory phasicity and response to augmentation. Popliteal Vein: No evidence of thrombus. Normal compressibility, respiratory phasicity and response to augmentation. Calf Veins: Visualized right deep calf veins are patent without thrombus. Limited evaluation. Superficial Great Saphenous Vein: No evidence of thrombus. Normal compressibility. Other Findings:  None. LEFT LOWER EXTREMITY Common Femoral Vein: No evidence of thrombus. Normal compressibility, respiratory phasicity and response to augmentation. Saphenofemoral Junction: No evidence of thrombus. Normal compressibility and flow on color Doppler imaging. Profunda  Femoral Vein: No evidence of thrombus. Normal compressibility and flow on color Doppler imaging. Femoral Vein: No evidence of thrombus. Normal compressibility, respiratory phasicity and response to augmentation. Popliteal Vein: No evidence of thrombus. Normal compressibility, respiratory phasicity and response to augmentation. Calf Veins: Visualized left deep calf veins are patent without thrombus. Limited evaluation. Superficial Great Saphenous Vein: No evidence of thrombus. Normal compressibility. Other Findings:  None. IMPRESSION: No evidence of deep venous thrombosis in either lower extremity. Electronically Signed   By: Markus Daft M.D.   On: 05/23/2020 12:21   DG Chest Portable 1 View  Result Date: 05/22/2020 CLINICAL DATA:  Shortness of breath, hypoxemia, end-stage renal disease on dialysis, LEFT-sided crackles, RIGHT-side week EXAM: PORTABLE CHEST 1 VIEW COMPARISON:  Portable exam 1042 hours compared to 12/21/2019 FINDINGS: LEFT subclavian sequential pacemaker with leads projecting over RIGHT atrium, RIGHT ventricle, and coronary sinus. Enlargement of cardiac silhouette with vascular congestion. Minimal LEFT base atelectasis. RIGHT perihilar opacities extending into RIGHT upper lobe favor pneumonia although requiring follow-up until resolution to exclude underlying nodule. Question mild enlargement of RIGHT hilum versus perihilar infiltrate. Remaining lungs clear. No pleural effusion or pneumothorax. Bones demineralized. IMPRESSION: Enlargement of cardiac silhouette post pacemaker. Questionable RIGHT hilar enlargement/adenopathy versus perihilar infiltrate. RIGHT upper lobe pneumonia; follow-up exams until resolution recommended to exclude underlying pulmonary mass/nodule and hilar adenopathy. Electronically  Signed   By: Lavonia Dana M.D.   On: 05/22/2020 11:16   VAS [place] UPPER EXTREMITY ARTERIAL DUPLEX  Result Date: 04/29/2020 UPPER EXTREMITY DUPLEX STUDY Indications: Patient complains of HD access pre  op.  Performing Technologist: Ralene Cork RVT  Examination Guidelines: A complete evaluation includes B-mode imaging, spectral Doppler, color Doppler, and power Doppler as needed of all accessible portions of each vessel. Bilateral testing is considered an integral part of a complete examination. Limited examinations for reoccurring indications may be performed as noted.  Right Pre-Dialysis Findings: +-----------------------+----------+--------------------+---------+--------+  Location                PSV (cm/s) Intralum. Diam. (cm) Waveform  Comments  +-----------------------+----------+--------------------+---------+--------+  Brachial Antecub. fossa 98         0.50                 triphasic           +-----------------------+----------+--------------------+---------+--------+  Radial Art at Wrist     76         0.19                 triphasic           +-----------------------+----------+--------------------+---------+--------+  Ulnar Art at Wrist      66         0.16                 triphasic           +-----------------------+----------+--------------------+---------+--------+ Left Pre-Dialysis Findings: +-----------------------+----------+--------------------+---------+--------+  Location                PSV (cm/s) Intralum. Diam. (cm) Waveform  Comments  +-----------------------+----------+--------------------+---------+--------+  Brachial Antecub. fossa 81         0.52                 triphasic           +-----------------------+----------+--------------------+---------+--------+  Radial Art at Wrist     58         0.21                 triphasic           +-----------------------+----------+--------------------+---------+--------+  Ulnar Art at Wrist      61         0.15                 triphasic           +-----------------------+----------+--------------------+---------+--------+  Summary:  Right: Patent brachial, radial, and ulnar arteries with calcified        walls in the radial and ulnar arteries.  Left: Patent brachial, radial, and ulnar arteries with calcified       walls in the radial and ulnar arteries. *See table(s) above for measurements and observations. Electronically signed by Ruta Hinds MD on 04/29/2020 at 11:57:00 AM.    Final    ECHOCARDIOGRAM COMPLETE  Result Date: 05/23/2020    ECHOCARDIOGRAM REPORT   Patient Name:   UYEN EICHHOLZ Date of Exam: 05/23/2020 Medical Rec #:  [id number]          Height:       63.0 in Accession #:    [id number]         Weight:       233.0 lb Date of Birth:  12-09-1940         BSA:          2.063 m Patient Age:  78 years           BP:           148/63 mmHg Patient Gender: F                  HR:           83 bpm. Exam Location:  [facility] Procedure: 2D Echo, Cardiac Doppler and Color Doppler Indications:    Dyspnea  History:        Patient has prior history of Echocardiogram examinations, most                 recent 12/23/2019. CHF, Pacemaker, Mitral Valve Disease,                 Signs/Symptoms:Shortness of Breath; Risk Factors:Diabetes and                 Hypertension. Sepsis, pneumonia.  Sonographer:    Dustin Flock RDCS Referring Phys: [place]  1. Left ventricular ejection fraction, by estimation, is 60 to 65%. The left ventricle has normal function. The left ventricle has no regional wall motion abnormalities. There is moderate left ventricular hypertrophy. Left ventricular diastolic parameters are consistent with Grade I diastolic dysfunction (impaired relaxation).  2. Right ventricular systolic function is normal. The right ventricular size is normal. There is severely elevated pulmonary artery systolic pressure. The estimated right ventricular systolic pressure is 38.3 mmHg. Device wire present.  3. Left atrial size was mildly dilated.  4. The mitral valve is abnormal, mildly calcified and with moderate annular calcification. Mild mitral valve regurgitation.  5. The aortic valve is tricuspid. Aortic valve regurgitation  is not visualized.  6. The inferior vena cava is dilated in size with <50% respiratory variability, suggesting right atrial pressure of 15 mmHg. FINDINGS  Left Ventricle: Left ventricular ejection fraction, by estimation, is 60 to 65%. The left ventricle has normal function. The left ventricle has no regional wall motion abnormalities. The left ventricular internal cavity size was normal in size. There is  moderate left ventricular hypertrophy. Left ventricular diastolic parameters are consistent with Grade I diastolic dysfunction (impaired relaxation). Right Ventricle: The right ventricular size is normal. No increase in right ventricular wall thickness. Right ventricular systolic function is normal. There is severely elevated pulmonary artery systolic pressure. The tricuspid regurgitant velocity is 3.49 m/s, and with an assumed right atrial pressure of 15 mmHg, the estimated right ventricular systolic pressure is 33.8 mmHg. Left Atrium: Left atrial size was mildly dilated. Right Atrium: Right atrial size was normal in size. Pericardium: There is no evidence of pericardial effusion. Mitral Valve: The mitral valve is abnormal. There is mild calcification of the mitral valve leaflet(s). Moderate mitral annular calcification. Mild mitral valve regurgitation. Tricuspid Valve: The tricuspid valve is grossly normal. Tricuspid valve regurgitation is mild. Aortic Valve: The aortic valve is tricuspid. Aortic valve regurgitation is not visualized. Mild aortic valve annular calcification. Pulmonic Valve: The pulmonic valve was grossly normal. Pulmonic valve regurgitation is mild. Aorta: The aortic root is normal in size and structure. Venous: The inferior vena cava is dilated in size with less than 50% respiratory variability, suggesting right atrial pressure of 15 mmHg. IAS/Shunts: No atrial level shunt detected by color flow Doppler. Additional Comments: A pacer wire is visualized.  LEFT VENTRICLE PLAX 2D LVIDd:          4.24 cm  Diastology LVIDs:         3.01 cm  LV  e' lateral:   3.59 cm/s LV PW:         1.50 cm  LV E/e' lateral: 33.4 LV IVS:        1.52 cm  LV e' medial:    5.44 cm/s LVOT diam:     2.00 cm  LV E/e' medial:  22.1 LV SV:         93 LV SV Index:   45 LVOT Area:     3.14 cm  RIGHT VENTRICLE RV Basal diam:  3.78 cm RV S prime:     9.90 cm/s TAPSE (M-mode): 3.2 cm LEFT ATRIUM             Index       RIGHT ATRIUM           Index LA diam:        4.30 cm 2.08 cm/m  RA Area:     21.90 cm LA Vol (A2C):   52.2 ml 25.30 ml/m RA Volume:   67.60 ml  32.76 ml/m LA Vol (A4C):   74.4 ml 36.06 ml/m LA Biplane Vol: 69.1 ml 33.49 ml/m  AORTIC VALVE LVOT Vmax:   130.00 cm/s LVOT Vmean:  96.800 cm/s LVOT VTI:    0.295 m  AORTA Ao Root diam: 3.00 cm MITRAL VALVE                TRICUSPID VALVE MV Area (PHT): 3.28 cm     TR Peak grad:   48.7 mmHg MV Decel Time: 231 msec     TR Vmax:        349.00 cm/s MV E velocity: 120.00 cm/s MV A velocity: 125.00 cm/s  SHUNTS MV E/A ratio:  0.96         Systemic VTI:  0.30 m                             Systemic Diam: 2.00 cm Rozann Lesches MD Electronically signed by Rozann Lesches MD Signature Date/Time: 05/23/2020/11:07:51 AM    Final    VAS [place] UPPER EXTREMITY VEIN MAPPING  Result Date: 04/29/2020 UPPER EXTREMITY VEIN MAPPING  Indications: Pre-access. Performing Technologist: Ralene Cork RVT  Examination Guidelines: A complete evaluation includes B-mode imaging, spectral Doppler, color Doppler, and power Doppler as needed of all accessible portions of each vessel. Bilateral testing is considered an integral part of a complete examination. Limited examinations for reoccurring indications may be performed as noted. +-----------------+-------------+----------+--------+  Right Cephalic    Diameter (cm) Depth (cm) Findings  +-----------------+-------------+----------+--------+  Shoulder              0.55                           +-----------------+-------------+----------+--------+  Prox  upper arm        0.53                           +-----------------+-------------+----------+--------+  Mid upper arm         0.48                           +-----------------+-------------+----------+--------+  Dist upper arm        0.47                           +-----------------+-------------+----------+--------+  Antecubital fossa     0.66                           +-----------------+-------------+----------+--------+  Prox forearm          0.34                           +-----------------+-------------+----------+--------+  Mid forearm           0.30                           +-----------------+-------------+----------+--------+  Dist forearm          0.24                 lateral   +-----------------+-------------+----------+--------+ +-----------------+-------------+----------+--------+  Right Basilic     Diameter (cm) Depth (cm) Findings  +-----------------+-------------+----------+--------+  Mid upper arm         0.43                           +-----------------+-------------+----------+--------+  Dist upper arm        0.52                           +-----------------+-------------+----------+--------+  Antecubital fossa     0.28                           +-----------------+-------------+----------+--------+ +-----------------+-------------+----------+--------------+  Left Cephalic     Diameter (cm) Depth (cm)    Findings     +-----------------+-------------+----------+--------------+  Shoulder              0.28                                 +-----------------+-------------+----------+--------------+  Prox upper arm        0.18                                 +-----------------+-------------+----------+--------------+  Mid upper arm         0.18                                 +-----------------+-------------+----------+--------------+  Dist upper arm        0.22                                 +-----------------+-------------+----------+--------------+  Antecubital fossa     0.25                                  +-----------------+-------------+----------+--------------+  Prox forearm          0.16                                 +-----------------+-------------+----------+--------------+  Mid forearm           0.12                                 +-----------------+-------------+----------+--------------+  Dist forearm                               not visualized  +-----------------+-------------+----------+--------------+ +-----------------+-------------+----------+--------+  Left Basilic      Diameter (cm) Depth (cm) Findings  +-----------------+-------------+----------+--------+  Mid upper arm         0.38                           +-----------------+-------------+----------+--------+  Dist upper arm        0.48                           +-----------------+-------------+----------+--------+  Antecubital fossa     0.25                           +-----------------+-------------+----------+--------+ Summary: Right: Patent cephalic and basilic veins. Left: Patent cephalic and basilic veins. *See table(s) above for measurements and observations.  Diagnosing physician: Ruta Hinds MD Electronically signed by Ruta Hinds MD on 04/29/2020 at 11:57:08 AM.    Final     Micro Results   Recent Results (from the past 240 hour(s))  Culture, blood (Routine X 2) w Reflex to ID Panel     Status: None   Collection Time: 05/22/20 11:17 AM   Specimen: Left Antecubital; Blood  Result Value Ref Range Status   Specimen Description LEFT ANTECUBITAL  Final   Special Requests   Final    BOTTLES DRAWN AEROBIC AND ANAEROBIC Blood Culture adequate volume   Culture   Final    NO GROWTH 5 DAYS Performed at [facility], [address] [place], [place] [address]    Report Status 05/27/2020 FINAL  Final  SARS Coronavirus 2 by RT PCR (hospital order, performed in [facility] hospital lab) Nasopharyngeal Nasopharyngeal Swab     Status: None   Collection Time: 05/22/20 12:01 PM   Specimen: Nasopharyngeal Swab  Result Value  Ref Range Status   SARS Coronavirus 2 NEGATIVE NEGATIVE Final    Comment: (NOTE) SARS-CoV-2 target nucleic acids are NOT DETECTED.  The SARS-CoV-2 RNA is generally detectable in upper and lower respiratory specimens during the acute phase of infection. The lowest concentration of SARS-CoV-2 viral copies this assay can detect is 250 copies / mL. A negative result does not preclude SARS-CoV-2 infection and should not be used as the sole basis for treatment or other patient management decisions.  A negative result may occur with improper specimen collection / handling, submission of specimen other than nasopharyngeal swab, presence of viral mutation(s) within the areas targeted by this assay, and inadequate number of viral copies (<250 copies / mL). A negative result must be combined with clinical observations, patient history, and epidemiological information.  Fact Sheet for Patients:   StrictlyIdeas.no  Fact Sheet for Healthcare Providers: BankingDealers.co.za  This test is not yet approved or  cleared by the [place] FDA and has been authorized for detection and/or diagnosis of SARS-CoV-2 by FDA under an Emergency Use Authorization (EUA).  This EUA will remain in effect (meaning this test can be used) for the duration of the COVID-19 declaration under Section 564(b)(1) of the Act, 21 U.S.C. section 360bbb-3(b)(1), unless the authorization is terminated or revoked sooner.  Performed at [facility], [address], [place], [place] [address]   Culture, blood (Routine X 2) w  Reflex to ID Panel     Status: None   Collection Time: 05/22/20  6:40 PM   Specimen: BLOOD  Result Value Ref Range Status   Specimen Description BLOOD BLOOD LEFT ARM  Final   Special Requests   Final    BOTTLES DRAWN AEROBIC AND ANAEROBIC Blood Culture results may not be optimal due to an inadequate volume of blood received in culture bottles   Culture    Final    NO GROWTH 5 DAYS Performed at [facility], [address], [place], [place] [address]    Report Status 05/27/2020 FINAL  Final       Today   Subjective    [name] today has no new concerns No fever  Or chills   No Nausea, Vomiting or Diarrhea DOE persist shob at rest has resolved      Patient has been seen and examined prior to discharge   Objective   Blood pressure (!) 155/61, pulse 82, temperature 98.2 F (36.8 C), resp. rate 20, height 5' 3" (1.6 m), weight 107.7 kg, SpO2 90 %.  No intake or output data in the 24 hours ending 05/28/20 1314  Exam Gen:- Awake Alert,  morbidly obese, HEENT:- [place].AT, No sclera icterus Nose- [place] 3L/min Neck-Supple Neck ,. +ve JVD Lungs-improving air movement,, scattered rhonchi on the Rt CV- S1, S2 normal, regular  Abd-  +ve B.Sounds, Abd Soft, increased truncal adiposity Extremity/Skin:-Bilateral lower extremity edema right more than left, pedal pulses present Psych-affect is appropriate, oriented x3 Neuro-generalized weakness ,no new focal deficits, no tremors MSK--Rt UE AVF   Data Review   CBC w Diff:  Lab Results  Component Value Date   WBC 9.9 05/28/2020   HGB 8.5 (L) 05/28/2020   HCT 28.4 (L) 05/28/2020   PLT 378 05/28/2020   LYMPHOPCT 14 05/22/2020   MONOPCT 8 05/22/2020   EOSPCT 0 05/22/2020   BASOPCT 0 05/22/2020    CMP:  Lab Results  Component Value Date   NA 139 05/28/2020   K 4.3 05/28/2020   CL 108 05/28/2020   CO2 20 (L) 05/28/2020   BUN 63 (H) 05/28/2020   CREATININE 3.70 (H) 05/28/2020   PROT 7.0 05/22/2020   ALBUMIN 3.3 (L) 05/22/2020   BILITOT 0.6 05/22/2020   ALKPHOS 67 05/22/2020   AST 46 (H) 05/22/2020   ALT 34 05/22/2020  .   Total Discharge time is about 33 minutes  Roxan Hockey M.D on 05/28/2020 at 1:14 PM  Go to [email or web address] -  for contact info  [facility] - Office  [phone]

## 2020-05-28 NOTE — Discharge Instructions (Signed)
1)Very low-salt diet advised 2)Weigh yourself daily, call if you gain more than 3 pounds in 1 day or more than 5 pounds in 1 week as your diuretic medications may need to be adjusted 3)Limit your Fluid  intake to no more than 50 ounces (1.5 Liters) per day 4)Avoid ibuprofen/Advil/Aleve/Motrin/Goody Powders/Naproxen/BC powders/Meloxicam/Diclofenac/Indomethacin and other Nonsteroidal anti-inflammatory medications as these will make you more likely to bleed and can cause stomach ulcers, can also cause Kidney problems.  5)Follow - up with Dr. Kara Mead Pulmonologist in [place] further evaluation for possible obstructive sleep apnea and management of severe pulmonary hypertension -Address: [address], [place], [place] [address] Phone: [phone] OR call [phone] -Patient needs to be seen in the [place] office, Not in [place]  6) follow-up with Dr. Lavone Neri --PA--- the GI physician/gastroenterologist at [facility] in [place] in 2 to 4 weeks for recheck and reevaluation and possible endoscopy  7)you need oxygen at home at 3 L via nasal cannula continuously while awake and while asleep--- smoking or having open fires around oxygen can cause fire, significant injury and death  8) follow-up with your nephrologist/kidney doctor within a week for repeat CBC and BMP blood test

## 2020-05-28 NOTE — Progress Notes (Signed)
     SATURATION QUALIFICATIONS: (This note is used to comply with regulatory documentation for home oxygen)   Patient Saturations on Room Air at Rest = 85 %   Patient Saturations on Room Air while Ambulating = 83 %   Patient Saturations on 3 Liters of oxygen while Ambulating = 93  %        Patient needs continuous O2 at 3 L/min continuously via nasal cannula with humidifier, with gaseous portability and conserving device   Diagnosis--- Congestive heart failure and pulmonary hypertension  Roxan Hockey, MD

## 2020-05-28 NOTE — TOC Transition Note (Signed)
Transition of Care [facility]) - CM/SW Discharge Note   Patient Details  Name: Cassandra Parker MRN: [id number] Date of Birth: 09-18-1941  [facility] CM/SW Contact:  Shade Flood, LCSW Phone Number: 05/28/2020, 1:01 PM   Clinical Narrative:     Pt stable for dc today per MD. Barbaraann Rondo from [employer] arranging O2 and RW for dc. Pt can dc once DME arrangement complete.  No other TOC needs for dc.  Final next level of care: Home/Self Care Barriers to Discharge: Barriers Resolved   Patient Goals and CMS Choice   CMS Medicare.gov Compare Post Acute Care list provided to:: Patient Choice offered to / list presented to : Patient  Discharge Placement                       Discharge Plan and Services                DME Arranged: Oxygen, Walker rolling with seat DME Agency: [employer] Date DME Agency Contacted: 05/27/20 Time DME Agency Contacted: 1418 Representative spoke with at DME Agency: [place] ([place]) Interventions     Readmission Risk Interventions Readmission Risk Prevention Plan 05/27/2020  Transportation Screening Complete  HRI or [place] Complete  Social Work Consult for [place] Planning/Counseling Complete  Palliative Care Screening Not Applicable  Medication Review Press photographer) Complete  Some recent data might be hidden

## 2020-06-01 DIAGNOSIS — R0902 Hypoxemia: Secondary | ICD-10-CM | POA: Diagnosis not present

## 2020-06-01 DIAGNOSIS — N184 Chronic kidney disease, stage 4 (severe): Secondary | ICD-10-CM | POA: Diagnosis not present

## 2020-06-01 DIAGNOSIS — J9601 Acute respiratory failure with hypoxia: Secondary | ICD-10-CM | POA: Diagnosis not present

## 2020-06-01 DIAGNOSIS — J189 Pneumonia, unspecified organism: Secondary | ICD-10-CM | POA: Diagnosis not present

## 2020-06-01 DIAGNOSIS — I272 Pulmonary hypertension, unspecified: Secondary | ICD-10-CM | POA: Diagnosis not present

## 2020-06-01 DIAGNOSIS — R609 Edema, unspecified: Secondary | ICD-10-CM | POA: Diagnosis not present

## 2020-06-01 DIAGNOSIS — D649 Anemia, unspecified: Secondary | ICD-10-CM | POA: Diagnosis not present

## 2020-06-01 DIAGNOSIS — A419 Sepsis, unspecified organism: Secondary | ICD-10-CM | POA: Diagnosis not present

## 2020-06-01 DIAGNOSIS — Z6841 Body Mass Index (BMI) 40.0 and over, adult: Secondary | ICD-10-CM | POA: Diagnosis not present

## 2020-06-01 DIAGNOSIS — K922 Gastrointestinal hemorrhage, unspecified: Secondary | ICD-10-CM | POA: Diagnosis not present

## 2020-06-01 DIAGNOSIS — I5033 Acute on chronic diastolic (congestive) heart failure: Secondary | ICD-10-CM | POA: Diagnosis not present

## 2020-06-05 ENCOUNTER — Other Ambulatory Visit: Payer: Self-pay

## 2020-06-05 ENCOUNTER — Emergency Department (HOSPITAL_COMMUNITY): Payer: Medicare HMO

## 2020-06-05 ENCOUNTER — Encounter (HOSPITAL_COMMUNITY): Payer: Self-pay | Admitting: Emergency Medicine

## 2020-06-05 ENCOUNTER — Inpatient Hospital Stay (HOSPITAL_COMMUNITY)
Admission: EM | Admit: 2020-06-05 | Discharge: 2020-06-09 | DRG: 682 | Disposition: A | Discharge status: Home Health | Payer: Medicare HMO | Attending: Internal Medicine | Admitting: Internal Medicine

## 2020-06-05 DIAGNOSIS — I132 Hypertensive heart and chronic kidney disease with heart failure and with stage 5 chronic kidney disease, or end stage renal disease: Secondary | ICD-10-CM | POA: Diagnosis not present

## 2020-06-05 DIAGNOSIS — R4182 Altered mental status, unspecified: Secondary | ICD-10-CM

## 2020-06-05 DIAGNOSIS — R0902 Hypoxemia: Secondary | ICD-10-CM | POA: Diagnosis not present

## 2020-06-05 DIAGNOSIS — E1151 Type 2 diabetes mellitus with diabetic peripheral angiopathy without gangrene: Secondary | ICD-10-CM | POA: Diagnosis present

## 2020-06-05 DIAGNOSIS — E1122 Type 2 diabetes mellitus with diabetic chronic kidney disease: Secondary | ICD-10-CM | POA: Diagnosis present

## 2020-06-05 DIAGNOSIS — I959 Hypotension, unspecified: Secondary | ICD-10-CM | POA: Diagnosis not present

## 2020-06-05 DIAGNOSIS — I517 Cardiomegaly: Secondary | ICD-10-CM | POA: Diagnosis not present

## 2020-06-05 DIAGNOSIS — Z950 Presence of cardiac pacemaker: Secondary | ICD-10-CM

## 2020-06-05 DIAGNOSIS — N179 Acute kidney failure, unspecified: Secondary | ICD-10-CM

## 2020-06-05 DIAGNOSIS — J811 Chronic pulmonary edema: Secondary | ICD-10-CM | POA: Diagnosis not present

## 2020-06-05 DIAGNOSIS — N19 Unspecified kidney failure: Secondary | ICD-10-CM | POA: Insufficient documentation

## 2020-06-05 DIAGNOSIS — R410 Disorientation, unspecified: Secondary | ICD-10-CM | POA: Diagnosis not present

## 2020-06-05 DIAGNOSIS — I5033 Acute on chronic diastolic (congestive) heart failure: Secondary | ICD-10-CM | POA: Diagnosis not present

## 2020-06-05 DIAGNOSIS — E785 Hyperlipidemia, unspecified: Secondary | ICD-10-CM | POA: Diagnosis present

## 2020-06-05 DIAGNOSIS — Z9049 Acquired absence of other specified parts of digestive tract: Secondary | ICD-10-CM | POA: Diagnosis not present

## 2020-06-05 DIAGNOSIS — J9611 Chronic respiratory failure with hypoxia: Secondary | ICD-10-CM

## 2020-06-05 DIAGNOSIS — R0689 Other abnormalities of breathing: Secondary | ICD-10-CM | POA: Diagnosis not present

## 2020-06-05 DIAGNOSIS — I10 Essential (primary) hypertension: Secondary | ICD-10-CM | POA: Diagnosis present

## 2020-06-05 DIAGNOSIS — Z8701 Personal history of pneumonia (recurrent): Secondary | ICD-10-CM

## 2020-06-05 DIAGNOSIS — N185 Chronic kidney disease, stage 5: Secondary | ICD-10-CM | POA: Diagnosis present

## 2020-06-05 DIAGNOSIS — Z833 Family history of diabetes mellitus: Secondary | ICD-10-CM

## 2020-06-05 DIAGNOSIS — G9341 Metabolic encephalopathy: Secondary | ICD-10-CM | POA: Diagnosis present

## 2020-06-05 DIAGNOSIS — J189 Pneumonia, unspecified organism: Secondary | ICD-10-CM | POA: Diagnosis not present

## 2020-06-05 DIAGNOSIS — Z9011 Acquired absence of right breast and nipple: Secondary | ICD-10-CM | POA: Diagnosis not present

## 2020-06-05 DIAGNOSIS — I442 Atrioventricular block, complete: Secondary | ICD-10-CM | POA: Diagnosis present

## 2020-06-05 DIAGNOSIS — E8770 Fluid overload, unspecified: Secondary | ICD-10-CM | POA: Diagnosis not present

## 2020-06-05 DIAGNOSIS — E7800 Pure hypercholesterolemia, unspecified: Secondary | ICD-10-CM | POA: Diagnosis present

## 2020-06-05 DIAGNOSIS — R404 Transient alteration of awareness: Secondary | ICD-10-CM | POA: Diagnosis not present

## 2020-06-05 DIAGNOSIS — I428 Other cardiomyopathies: Secondary | ICD-10-CM | POA: Diagnosis present

## 2020-06-05 DIAGNOSIS — R918 Other nonspecific abnormal finding of lung field: Secondary | ICD-10-CM | POA: Diagnosis not present

## 2020-06-05 DIAGNOSIS — Z8249 Family history of ischemic heart disease and other diseases of the circulatory system: Secondary | ICD-10-CM

## 2020-06-05 DIAGNOSIS — N189 Chronic kidney disease, unspecified: Secondary | ICD-10-CM | POA: Diagnosis not present

## 2020-06-05 DIAGNOSIS — N184 Chronic kidney disease, stage 4 (severe): Secondary | ICD-10-CM | POA: Diagnosis not present

## 2020-06-05 DIAGNOSIS — Z9221 Personal history of antineoplastic chemotherapy: Secondary | ICD-10-CM | POA: Diagnosis not present

## 2020-06-05 DIAGNOSIS — Z79899 Other long term (current) drug therapy: Secondary | ICD-10-CM | POA: Diagnosis not present

## 2020-06-05 DIAGNOSIS — I120 Hypertensive chronic kidney disease with stage 5 chronic kidney disease or end stage renal disease: Secondary | ICD-10-CM | POA: Diagnosis not present

## 2020-06-05 DIAGNOSIS — J810 Acute pulmonary edema: Secondary | ICD-10-CM | POA: Diagnosis not present

## 2020-06-05 DIAGNOSIS — I129 Hypertensive chronic kidney disease with stage 1 through stage 4 chronic kidney disease, or unspecified chronic kidney disease: Secondary | ICD-10-CM | POA: Diagnosis not present

## 2020-06-05 DIAGNOSIS — I272 Pulmonary hypertension, unspecified: Secondary | ICD-10-CM | POA: Diagnosis present

## 2020-06-05 DIAGNOSIS — D631 Anemia in chronic kidney disease: Secondary | ICD-10-CM | POA: Diagnosis present

## 2020-06-05 DIAGNOSIS — Z853 Personal history of malignant neoplasm of breast: Secondary | ICD-10-CM | POA: Diagnosis not present

## 2020-06-05 DIAGNOSIS — M1990 Unspecified osteoarthritis, unspecified site: Secondary | ICD-10-CM | POA: Diagnosis present

## 2020-06-05 DIAGNOSIS — Z20822 Contact with and (suspected) exposure to covid-19: Secondary | ICD-10-CM | POA: Diagnosis not present

## 2020-06-05 DIAGNOSIS — Z96652 Presence of left artificial knee joint: Secondary | ICD-10-CM | POA: Diagnosis present

## 2020-06-05 DIAGNOSIS — Z6841 Body Mass Index (BMI) 40.0 and over, adult: Secondary | ICD-10-CM

## 2020-06-05 DIAGNOSIS — A419 Sepsis, unspecified organism: Secondary | ICD-10-CM | POA: Diagnosis not present

## 2020-06-05 DIAGNOSIS — E1129 Type 2 diabetes mellitus with other diabetic kidney complication: Secondary | ICD-10-CM | POA: Diagnosis not present

## 2020-06-05 LAB — PROCALCITONIN: Procalcitonin: <0.1 ng/mL

## 2020-06-05 LAB — CBC
HCT: 27.6 % — ABNORMAL LOW (ref 36.0–46.0)
Hemoglobin: 8.1 g/dL — ABNORMAL LOW (ref 12.0–15.0)
MCH: 28.2 pg (ref 26.0–34.0)
MCHC: 29.3 g/dL — ABNORMAL LOW (ref 30.0–36.0)
MCV: 96.2 fL (ref 80.0–100.0)
Platelets: 458 10*3/uL — ABNORMAL HIGH (ref 150–400)
RBC: 2.87 MIL/uL — ABNORMAL LOW (ref 3.87–5.11)
RDW: 15.7 % — ABNORMAL HIGH (ref 11.5–15.5)
WBC: 8.9 10*3/uL (ref 4.0–10.5)
nRBC: 0 % (ref 0.0–0.2)

## 2020-06-05 LAB — COMPREHENSIVE METABOLIC PANEL
ALT: 113 U/L — ABNORMAL HIGH (ref 0–44)
AST: 61 U/L — ABNORMAL HIGH (ref 15–41)
Albumin: 3.3 g/dL — ABNORMAL LOW (ref 3.5–5.0)
Alkaline Phosphatase: 79 U/L (ref 38–126)
Anion gap: 11 (ref 5–15)
BUN: 94 mg/dL — ABNORMAL HIGH (ref 8–23)
CO2: 21 mmol/L — ABNORMAL LOW (ref 22–32)
Calcium: 8.6 mg/dL — ABNORMAL LOW (ref 8.9–10.3)
Chloride: 111 mmol/L (ref 98–111)
Creatinine, Ser: 4.05 mg/dL — ABNORMAL HIGH (ref 0.44–1.00)
GFR calc Af Amer: 12 mL/min — ABNORMAL LOW (ref >60)
GFR calc non Af Amer: 10 mL/min — ABNORMAL LOW (ref >60)
Glucose, Bld: 169 mg/dL — ABNORMAL HIGH (ref 70–99)
Potassium: 4.8 mmol/L (ref 3.5–5.1)
Sodium: 143 mmol/L (ref 135–145)
Total Bilirubin: 0.5 mg/dL (ref 0.3–1.2)
Total Protein: 7.1 g/dL (ref 6.5–8.1)

## 2020-06-05 LAB — URINALYSIS, ROUTINE W REFLEX MICROSCOPIC
Bilirubin Urine: NEGATIVE
Glucose, UA: NEGATIVE mg/dL
Ketones, ur: NEGATIVE mg/dL
Leukocytes,Ua: NEGATIVE
Nitrite: NEGATIVE
Protein, ur: 100 mg/dL — AB
Specific Gravity, Urine: 1.013 (ref 1.005–1.030)
pH: 5 (ref 5.0–8.0)

## 2020-06-05 LAB — IRON AND TIBC
Iron: 35 ug/dL (ref 28–170)
Saturation Ratios: 13 % (ref 10.4–31.8)
TIBC: 271 ug/dL (ref 250–450)
UIBC: 236 ug/dL

## 2020-06-05 LAB — SARS CORONAVIRUS 2 BY RT PCR (HOSPITAL ORDER, PERFORMED IN ~~LOC~~ HOSPITAL LAB): SARS Coronavirus 2: NEGATIVE

## 2020-06-05 LAB — FOLATE
Folate: 10.8 ng/mL (ref >5.9)
Folate: 11.4 ng/mL (ref >5.9)

## 2020-06-05 LAB — TROPONIN I (HIGH SENSITIVITY)
Troponin I (High Sensitivity): 49 ng/L — ABNORMAL HIGH (ref <18)
Troponin I (High Sensitivity): 48 ng/L — ABNORMAL HIGH (ref <18)

## 2020-06-05 LAB — BRAIN NATRIURETIC PEPTIDE: B Natriuretic Peptide: 1406 pg/mL — ABNORMAL HIGH (ref 0.0–100.0)

## 2020-06-05 LAB — T4, FREE: Free T4: 0.78 ng/dL (ref 0.61–1.12)

## 2020-06-05 LAB — TYPE AND SCREEN
ABO/RH(D): A POS
Antibody Screen: NEGATIVE

## 2020-06-05 LAB — RETICULOCYTES
Immature Retic Fract: 25.3 % — ABNORMAL HIGH (ref 2.3–15.9)
RBC.: 2.88 MIL/uL — ABNORMAL LOW (ref 3.87–5.11)
Retic Count, Absolute: 67.4 K/uL (ref 19.0–186.0)
Retic Ct Pct: 2.3 % (ref 0.4–3.1)

## 2020-06-05 LAB — CREATININE, URINE, RANDOM: Creatinine, Urine: 92.84 mg/dL

## 2020-06-05 LAB — TSH: TSH: 4.236 u[IU]/mL (ref 0.350–4.500)

## 2020-06-05 LAB — LACTIC ACID, PLASMA: Lactic Acid, Venous: 0.6 mmol/L (ref 0.5–1.9)

## 2020-06-05 LAB — AMMONIA: Ammonia: 18 umol/L (ref 9–35)

## 2020-06-05 LAB — SODIUM, URINE, RANDOM: Sodium, Ur: 37 mmol/L

## 2020-06-05 LAB — FERRITIN: Ferritin: 75 ng/mL (ref 11–307)

## 2020-06-05 LAB — VITAMIN B12: Vitamin B-12: 2304 pg/mL — ABNORMAL HIGH (ref 180–914)

## 2020-06-05 MED ORDER — SODIUM CHLORIDE 0.9 % IV SOLN
1.0000 g | Freq: Once | INTRAVENOUS | Status: AC
  Administered 2020-06-05: 1 g via INTRAVENOUS
  Filled 2020-06-05: qty 10

## 2020-06-05 MED ORDER — FUROSEMIDE 10 MG/ML IJ SOLN
80.0000 mg | Freq: Once | INTRAMUSCULAR | Status: AC
  Administered 2020-06-05: 80 mg via INTRAVENOUS
  Filled 2020-06-05: qty 8

## 2020-06-05 MED ORDER — SODIUM CHLORIDE 0.9 % IV SOLN
500.0000 mg | Freq: Once | INTRAVENOUS | Status: AC
  Administered 2020-06-05: 500 mg via INTRAVENOUS
  Filled 2020-06-05: qty 500

## 2020-06-05 MED ORDER — ONDANSETRON HCL 4 MG/2ML IJ SOLN: 4.0000 mg | Freq: Four times a day (QID) | INTRAMUSCULAR | Status: DC | PRN

## 2020-06-05 MED ORDER — ONDANSETRON HCL 4 MG PO TABS: 4.0000 mg | ORAL_TABLET | Freq: Four times a day (QID) | ORAL | Status: DC | PRN

## 2020-06-05 MED ORDER — FUROSEMIDE 10 MG/ML IJ SOLN
INTRAMUSCULAR | Status: AC
  Filled 2020-06-05: qty 20

## 2020-06-05 MED ORDER — FUROSEMIDE 10 MG/ML IJ SOLN
120.0000 mg | Freq: Three times a day (TID) | INTRAVENOUS | Status: DC
  Administered 2020-06-05 – 2020-06-08 (×8): 120 mg via INTRAVENOUS
  Filled 2020-06-05 (×12): qty 12

## 2020-06-05 MED ORDER — CARVEDILOL 3.125 MG PO TABS
6.2500 mg | ORAL_TABLET | Freq: Two times a day (BID) | ORAL | Status: DC
  Administered 2020-06-05 – 2020-06-09 (×8): 6.25 mg via ORAL
  Filled 2020-06-05 (×8): qty 2

## 2020-06-05 MED ORDER — FUROSEMIDE 10 MG/ML IJ SOLN
INTRAMUSCULAR | Status: AC
  Filled 2020-06-05: qty 10

## 2020-06-05 MED ORDER — VITAMIN B-12 1000 MCG PO TABS
1000.0000 ug | ORAL_TABLET | Freq: Every day | ORAL | Status: DC
  Administered 2020-06-05 – 2020-06-09 (×5): 1000 ug via ORAL
  Filled 2020-06-05 (×5): qty 1

## 2020-06-05 MED ORDER — ACETAMINOPHEN 325 MG PO TABS: 650.0000 mg | ORAL_TABLET | Freq: Four times a day (QID) | ORAL | Status: DC | PRN

## 2020-06-05 MED ORDER — PRAVASTATIN SODIUM 10 MG PO TABS
10.0000 mg | ORAL_TABLET | Freq: Every day | ORAL | Status: DC
  Administered 2020-06-05 – 2020-06-08 (×4): 10 mg via ORAL
  Filled 2020-06-05 (×7): qty 1

## 2020-06-05 MED ORDER — ISOSORBIDE MONONITRATE ER 60 MG PO TB24
30.0000 mg | ORAL_TABLET | Freq: Every day | ORAL | Status: DC
  Administered 2020-06-05 – 2020-06-09 (×5): 30 mg via ORAL
  Filled 2020-06-05 (×8): qty 1

## 2020-06-05 MED ORDER — ACETAMINOPHEN 650 MG RE SUPP: 650.0000 mg | Freq: Four times a day (QID) | RECTAL | Status: DC | PRN

## 2020-06-05 MED ORDER — HEPARIN SODIUM (PORCINE) 5000 UNIT/ML IJ SOLN
5000.0000 [IU] | Freq: Three times a day (TID) | INTRAMUSCULAR | Status: DC
  Administered 2020-06-05 – 2020-06-09 (×12): 5000 [IU] via SUBCUTANEOUS
  Filled 2020-06-05 (×12): qty 1

## 2020-06-05 MED ORDER — FUROSEMIDE 10 MG/ML IJ SOLN
80.0000 mg | Freq: Two times a day (BID) | INTRAMUSCULAR | Status: DC
  Filled 2020-06-05: qty 8

## 2020-06-05 MED ORDER — VITAMIN D 25 MCG (1000 UNIT) PO TABS
2000.0000 [IU] | ORAL_TABLET | Freq: Every day | ORAL | Status: DC
  Administered 2020-06-05 – 2020-06-09 (×5): 2000 [IU] via ORAL
  Filled 2020-06-05 (×8): qty 2

## 2020-06-05 MED ORDER — SODIUM CHLORIDE 0.9 % IV BOLUS
500.0000 mL | Freq: Once | INTRAVENOUS | Status: AC
  Administered 2020-06-05: 500 mL via INTRAVENOUS

## 2020-06-05 NOTE — Consult Note (Addendum)
Renal Service Consult Note [facility]  Cassandra Parker 06/05/2020 Sol Blazing Requesting Physician: Dr Carles Collet, D.   Reason for Consult: Renal failure HPI: The patient is a 79 y.o. year-old w/ hx of PPM (2016), HTN, HL, DM2, nonischemic CM which resolved after PPM placed, hx breast Ca (rx'd 2008) brought to ED by EMS for altered mental status, gen weakness. Was dc'd last week from hospital for PNA c/b AKI, anemia, poss GIB, eliquis for DVT was dc'd d/t bleed/ anemia. [place] home on 5L [place] and po abx.  In ED labs showed creat 4.0, BUN 94, WBC 8k Hb 8. CThead neg, CXR upper opacities R> L . UA neg. AVF placed on 05/18/20.  Asked to see renal failure.   Pt seen in ED at [facility].  Pt's dtr at bedside.  Pt denies N/V, energy is low, no diarreha or abd pain.  Per dtr confusion is new and "not like her".  She is more lucid in the ED now then she was the last 1-2 days. Denies SOB or cough.  Is f/b CKD , she thinks Dr Joelyn Oms.   ROS  denies CP  no joint pain   no HA  no blurry vision  no rash  no diarrhea  no nausea/ vomiting  no dysuria  no difficulty voiding  no change in urine color    Past Medical History  Past Medical History:  Diagnosis Date  . Acute renal insufficiency 10/15/2013  . Arthritis   . Cancer [facility]) 2008   right breast  . Cancer of breast- s/p chemo and Rt mastectomy 2008 10/13/2013  . Cardiomyopathy ([place])    dating back to 2010  . CHB (complete heart block) ([place]) 10/13/2013  . Chronic combined systolic and diastolic congestive heart failure ([place])   . Diabetes mellitus    with neuropathy  . Diabetes mellitus type 2, controlled, without complications ([place]) 11/04/4707  . Dyslipidemia 10/13/2013  . History of blood transfusion   . Hypercholesterolemia   . Hypertension   . Mitral stenosis- moderate with trivial MR  10/15/2013  . Pacemaker 10/23/2014  . Pneumonia   . Presence of permanent cardiac pacemaker    [employer]  . Primary osteoarthritis of left knee  11/04/2015  . Syncope 10/13/2013  . Tingling    feet bilat    Past Surgical History  Past Surgical History:  Procedure Laterality Date  . APPENDECTOMY     mmh  . AV FISTULA PLACEMENT Right 05/18/2020   Procedure: RIGHT ARM BRACHIOCEPHALIC ARTERIOVENOUS (AV) FISTULA CREATION;  Surgeon: Elam Dutch, MD;  Location: [place];  Service: Vascular;  Laterality: Right;  . BI-VENTRICULAR PACEMAKER INSERTION N/A 10/15/2013   Procedure: BI-VENTRICULAR PACEMAKER INSERTION (CRT-P);  Surgeon: Evans Lance, MD;  Location: [facility] CATH LAB;  Service: Cardiovascular;  Laterality: N/A;  . Cardiomyopathy    . CATARACT EXTRACTION W/PHACO  06/29/2011   Procedure: CATARACT EXTRACTION PHACO AND INTRAOCULAR LENS PLACEMENT (IOC);  Surgeon: Tonny Branch;  Location: [facility];  Service: Ophthalmology;  Laterality: Right;  CDE: 14.41  . CATARACT EXTRACTION W/PHACO  09/04/2011   Procedure: CATARACT EXTRACTION PHACO AND INTRAOCULAR LENS PLACEMENT (IOC);  Surgeon: Tonny Branch;  Location: [facility];  Service: Ophthalmology;  Laterality: Left;  CDE: 15.21  . CHOLECYSTECTOMY     mmh  . JOINT REPLACEMENT Left    knee  . KNEE ARTHROSCOPY     right-mmh  . MASTECTOMY  2008    mmh-right  . TEMPORARY PACEMAKER INSERTION N/A  10/13/2013   Procedure: TEMPORARY PACEMAKER INSERTION;  Surgeon: Jolaine Artist, MD;  Location: [facility] CATH LAB;  Service: Cardiovascular;  Laterality: N/A;  . TOTAL KNEE ARTHROPLASTY Left 11/04/2015   Procedure: LEFT TOTAL KNEE ARTHROPLASTY;  Surgeon: Rod Can, MD;  Location: [facility];  Service: Orthopedics;  Laterality: Left;   Family History  Family History  Problem Relation Age of Onset  . Hypertension Father   . Diabetes Father   . Anesthesia problems Neg Hx   . Hypotension Neg Hx   . Malignant hyperthermia Neg Hx   . Pseudochol deficiency Neg Hx   . Colon cancer Neg Hx   . Colon polyps Neg Hx    Social History  reports that she has never smoked. She has never used smokeless tobacco. She reports that  she does not drink alcohol and does not use drugs. Allergies No Known Allergies Home medications Prior to Admission medications   Medication Sig Start Date End Date Taking? Authorizing Provider  acetaminophen (TYLENOL) 325 MG tablet Take 2 tablets (650 mg total) by mouth every 6 (six) hours as needed for mild pain or headache (or Fever >/= 101). 12/23/19  Yes Emokpae, Courage, MD  albuterol (PROVENTIL) (2.5 MG/3ML) 0.083% nebulizer solution Take 3 mLs (2.5 mg total) by nebulization every 2 (two) hours as needed for wheezing or shortness of breath. 05/28/20  Yes Emokpae, Courage, MD  amLODipine (NORVASC) 10 MG tablet Take 1 tablet (10 mg total) by mouth daily. 05/28/20  Yes Emokpae, Courage, MD  carvedilol (COREG) 6.25 MG tablet Take 1 tablet (6.25 mg total) by mouth 2 (two) times daily. 05/28/20  Yes Emokpae, Courage, MD  Cholecalciferol (VITAMIN D) 2000 UNITS tablet Take 2,000 Units by mouth daily.    Yes [provider]  furosemide (LASIX) 40 MG tablet Take 1 tablet (40 mg total) by mouth daily. 05/28/20  Yes Emokpae, Courage, MD  glipiZIDE (GLUCOTROL XL) 5 MG 24 hr tablet Take 5 mg by mouth See admin instructions. Take 10 mg in the morning and 5 mg at night   Yes [provider]  guaiFENesin (MUCINEX) 600 MG 12 hr tablet Take 1 tablet (600 mg total) by mouth 2 (two) times daily. 05/28/20  Yes Emokpae, Courage, MD  hydrALAZINE (APRESOLINE) 50 MG tablet Take 50 mg by mouth 2 (two) times daily.   Yes [provider]  isosorbide mononitrate (IMDUR) 30 MG 24 hr tablet Take 1 tablet (30 mg total) by mouth daily. 05/28/20 05/28/21 Yes Emokpae, Courage, MD  pantoprazole (PROTONIX) 40 MG tablet Take 1 tablet (40 mg total) by mouth daily. 05/28/20  Yes Emokpae, Courage, MD  potassium chloride (KLOR-CON) 10 MEQ tablet Take 10 mEq by mouth daily.    Yes [provider]  pravastatin (PRAVACHOL) 10 MG tablet Take 10 mg by mouth at bedtime.    Yes [provider]  vitamin B-12  (CYANOCOBALAMIN) 1000 MCG tablet Take 1,000 mcg by mouth daily.   Yes [provider]     Vitals:   06/05/20 0739 06/05/20 0845 06/05/20 1030 06/05/20 1100  BP: (!) 149/57  132/64 (!) 134/57  Pulse: 65  68 69  Resp: (!) 33  19 20  Temp: 98.3 F (36.8 C) (!) 97.3 F (36.3 C)    TempSrc: Oral Rectal    SpO2: 92%  97% 100%  Weight:      Height:       Exam Gen alert, not in distress, nasal O2 No rash, cyanosis or gangrene Sclera anicteric,  throat clear  No jvd or bruits Chest occ rales but mostly clear RRR no MRG Abd soft ntnd no mass or ascites +bs obese  GU purewick draining cloudy amber urine MS no joint effusions or deformity Ext 2+ hip and 2-3+ pretib bilat LE edema, no abd wall edema Neuro is alert, Ox 4 , gen'd weakness, nonfocal RUE AVF +audible bruit, no vein yet visible    Home meds:  - norvasc 10/ coreg 6.25 bid/ lasix 40 / hydralazine 50 bid/ imdur 30 qd/ kdur 10 qd/ pravachol 10 hs  - protonix 40  - glipizide xl 10 am and 5 pm   Date    Creat  eGFR ml/min  2012    1.2- 1.4  2015- 2020   1.87- 2.60  march- April 2021  2.6- 3.8 12- 20, ckd 4  aug 24- sept 3, 2021  3.33- 4.15 11-14, ckd 5   sept 11, 2021   4.05  Today    Na 143  K 4.8  CO2 21  BUN 94  Cr 4.05   Ca 8.6 Alb 2.2  LFT's ok   BNP 1400   WBC 8K  Hb 8.1   UA many bact, o/w negative     ECHO last done 05/23/20 - 60% LVEF, G1DD, ^IVC size c/w RAP 42mHg. Severe pHTN , est RV SBP 64   CXR 9/11 - IMPRESSION: Bilateral upper lobe airspace opacity, new on the left and similar on the right. Question atypical organism pneumonia. Stable cardiomegaly with a degree of pulmonary vascular congestion. Stable pacemaker lead placements. No adenopathy evident by radiography.   Assessment/ Plan: 1. AoCKD 4 vs new CKD 5 - f/b CKA.  Pt just admitted for PNA / sepsis, creat was 3.3- 4.1.  Now here w/ new confusion and creat 4.0, BUN 90's. Could be uremia vs meds, no N/V.  Pt's renal function has been  declining significantly over the last 1.5 yrs. She is sig vol overloaded.  The RUL infiltrates present since Aug 2021 on her CXR's looks like pulm edema. Not real symptomatic for infection or pulm edema. She has severe pulm HTN on her recent echo. Would recommend aggressive diuresis, hold abx. If MS changes persist will likely need a catheter placed and dialysis. She is lucid now.  No urgent indication for HD. Has AVF placed 2 wks ago, will take months for this to be ready. Will follow.  2. AMS - not really taking any pain meds or other meds that would cause AMS.  May be early sign of uremia. As above.  3. H/o NICM - resolved w/ PPM placement 4. DM2 - on oral agents 5. HTN - longstanding 6. Debility/ morbid obesity 7. H/o PPM      Rob Koji Niehoff  MD 06/05/2020, 3:57 PM  Recent Labs  Lab 06/05/20 0852  WBC 8.9  HGB 8.1*   Recent Labs  Lab 06/05/20 0852  K 4.8  BUN 94*  CREATININE 4.05*  CALCIUM 8.6*

## 2020-06-05 NOTE — ED Triage Notes (Signed)
Pt arrived by [facility] from home for AMS. Family states 530 this morning she "was confused and not her normal self."  She was just d/c last week for pneumonia.

## 2020-06-05 NOTE — ED Provider Notes (Signed)
[facility] EMERGENCY DEPARTMENT Provider Note   CSN: [id number] Arrival date & time: 06/05/20  9563     History Chief Complaint  Patient presents with  . Altered Mental Status    Cassandra Parker is a 79 y.o. female.  Patient with recent admission for pna, presents via EMS w c/o that family noticed that patient seemed generally weak, confused, not her normal self this AM. ?decreased po intake. Last noted at baseline yesterday. Pt denies specific new c/o this AM, ?confusion, limited historian - level 5 caveat. Patient denies fever or chills. No headache. No neck pain or stiffness. No chest pain or discomfort. States sob and non prod cough persist, but not acutely worse.  States did complete recent antibiotics. No abd pain or nvd. No dysuria. Denies trauma/fall.   The history is provided by the patient and the EMS personnel. The history is limited by the condition of the patient.  Altered Mental Status Presenting symptoms: confusion   Associated symptoms: no abdominal pain, no fever, no headaches, no rash and no vomiting        Past Medical History:  Diagnosis Date  . Acute renal insufficiency 10/15/2013  . Arthritis   . Cancer [facility]) 2008   right breast  . Cancer of breast- s/p chemo and Rt mastectomy 2008 10/13/2013  . Cardiomyopathy ([place])    dating back to 2010  . CHB (complete heart block) ([place]) 10/13/2013  . Chronic combined systolic and diastolic congestive heart failure ([place])   . Diabetes mellitus    with neuropathy  . Diabetes mellitus type 2, controlled, without complications ([place]) 8/75/6433  . Dyslipidemia 10/13/2013  . History of blood transfusion   . Hypercholesterolemia   . Hypertension   . Mitral stenosis- moderate with trivial MR  10/15/2013  . Pacemaker 10/23/2014  . Pneumonia   . Presence of permanent cardiac pacemaker    [employer]  . Primary osteoarthritis of left knee 11/04/2015  . Syncope 10/13/2013  . Tingling    feet bilat     Patient Active Problem  List   Diagnosis Date Noted  . Severe Pulmonary HTN-- 05/23/2020  . Respiratory failure with hypoxia ([place]) 05/22/2020  . Rt U L Pneumonia 05/22/2020  . GI bleed 05/22/2020  . Sepsis due to PNA 05/22/2020  . Pulmonary embolism--- VQ scan with intermediate probability 12/22/2019  . Chest pain 12/21/2019  . Primary osteoarthritis of left knee 11/04/2015  . Pacemaker 10/23/2014  . Acute renal insufficiency 10/15/2013  . Mitral stenosis- moderate with trivial MR  10/15/2013  . CHB (complete heart block) ([place]) 10/13/2013  . Syncope 10/13/2013  . HTN (hypertension) 10/13/2013  . Diabetes mellitus type 2, controlled, without complications ([place]) 29/51/8841  . Cancer of breast- s/p chemo and Rt mastectomy 2008 10/13/2013  . Cardiomyopathy- EF 35-40% (etiology not yet determined) 10/13/2013  . Dyslipidemia 10/13/2013    Past Surgical History:  Procedure Laterality Date  . APPENDECTOMY     mmh  . AV FISTULA PLACEMENT Right 05/18/2020   Procedure: RIGHT ARM BRACHIOCEPHALIC ARTERIOVENOUS (AV) FISTULA CREATION;  Surgeon: Elam Dutch, MD;  Location: [facility];  Service: Vascular;  Laterality: Right;  . BI-VENTRICULAR PACEMAKER INSERTION N/A 10/15/2013   Procedure: BI-VENTRICULAR PACEMAKER INSERTION (CRT-P);  Surgeon: Evans Lance, MD;  Location: [facility] CATH LAB;  Service: Cardiovascular;  Laterality: N/A;  . Cardiomyopathy    . CATARACT EXTRACTION W/PHACO  06/29/2011   Procedure: CATARACT EXTRACTION PHACO AND INTRAOCULAR LENS PLACEMENT (IOC);  Surgeon: Tonny Branch;  Location: [facility];  Service: Ophthalmology;  Laterality: Right;  CDE: 14.41  . CATARACT EXTRACTION W/PHACO  09/04/2011   Procedure: CATARACT EXTRACTION PHACO AND INTRAOCULAR LENS PLACEMENT (IOC);  Surgeon: Tonny Branch;  Location: [facility];  Service: Ophthalmology;  Laterality: Left;  CDE: 15.21  . CHOLECYSTECTOMY     mmh  . JOINT REPLACEMENT Left    knee  . KNEE ARTHROSCOPY     right-mmh  . MASTECTOMY  2008    mmh-right  . TEMPORARY  PACEMAKER INSERTION N/A 10/13/2013   Procedure: TEMPORARY PACEMAKER INSERTION;  Surgeon: Jolaine Artist, MD;  Location: [facility] C[facility] LAB;  Service: Cardiovascular;  Laterality: N/A;  . TOTAL KNEE ARTHROPLASTY Left 11/04/2015   Procedure: LEFT TOTAL KNEE ARTHROPLASTY;  Surgeon: Rod Can, MD;  Location: [facility];  Service: Orthopedics;  Laterality: Left;     OB History    Gravida  3   Para  3   Term  3   Preterm      AB      Living        SAB      TAB      Ectopic      Multiple      Live Births              Family History  Problem Relation Age of Onset  . Hypertension Father   . Diabetes Father   . Anesthesia problems Neg Hx   . Hypotension Neg Hx   . Malignant hyperthermia Neg Hx   . Pseudochol deficiency Neg Hx   . Colon cancer Neg Hx   . Colon polyps Neg Hx     Social History   Tobacco Use  . Smoking status: Never Smoker  . Smokeless tobacco: Never Used  Vaping Use  . Vaping Use: Never used  Substance Use Topics  . Alcohol use: No    Alcohol/week: 0.0 standard drinks  . Drug use: No    Home Medications Prior to Admission medications   Medication Sig Start Date End Date Taking? Authorizing Provider  acetaminophen (TYLENOL) 325 MG tablet Take 2 tablets (650 mg total) by mouth every 6 (six) hours as needed for mild pain or headache (or Fever >/= 101). 12/23/19   Roxan Hockey, MD  albuterol (PROVENTIL) (2.5 MG/3ML) 0.083% nebulizer solution Take 3 mLs (2.5 mg total) by nebulization every 2 (two) hours as needed for wheezing or shortness of breath. 05/28/20   Roxan Hockey, MD  amLODipine (NORVASC) 10 MG tablet Take 1 tablet (10 mg total) by mouth daily. 05/28/20   Roxan Hockey, MD  carvedilol (COREG) 6.25 MG tablet Take 1 tablet (6.25 mg total) by mouth 2 (two) times daily. 05/28/20   Roxan Hockey, MD  Cholecalciferol (VITAMIN D) 2000 UNITS tablet Take 2,000 Units by mouth daily.     [provider]  furosemide (LASIX) 40 MG tablet  Take 1 tablet (40 mg total) by mouth daily. 05/28/20   Emokpae, Courage, MD  glipiZIDE (GLUCOTROL XL) 5 MG 24 hr tablet Take 5 mg by mouth See admin instructions. Take 10 mg in the morning and 5 mg at night    [provider]  guaiFENesin (MUCINEX) 600 MG 12 hr tablet Take 1 tablet (600 mg total) by mouth 2 (two) times daily. 05/28/20   Roxan Hockey, MD  hydrALAZINE (APRESOLINE) 50 MG tablet Take 50 mg by mouth 2 (two) times daily.    [provider]  HYDROcodone-acetaminophen (NORCO) 5-325 MG tablet Take 1  tablet by mouth every 6 (six) hours as needed for moderate pain. 05/18/20   Dagoberto Ligas, PA-C  isosorbide mononitrate (IMDUR) 30 MG 24 hr tablet Take 1 tablet (30 mg total) by mouth daily. 05/28/20 05/28/21  Roxan Hockey, MD  pantoprazole (PROTONIX) 40 MG tablet Take 1 tablet (40 mg total) by mouth daily. 05/28/20   Roxan Hockey, MD  potassium chloride (KLOR-CON) 10 MEQ tablet Take 10 mEq by mouth daily.     [provider]  pravastatin (PRAVACHOL) 10 MG tablet Take 10 mg by mouth at bedtime.     [provider]  vitamin B-12 (CYANOCOBALAMIN) 1000 MCG tablet Take 1,000 mcg by mouth daily.    [provider]    Allergies    Patient has no known allergies.  Review of Systems   Review of Systems  Constitutional: Negative for chills and fever.  HENT: Negative for sore throat.   Eyes: Negative for redness.  Respiratory: Negative for cough.   Cardiovascular: Negative for chest pain.  Gastrointestinal: Negative for abdominal pain, diarrhea and vomiting.  Endocrine: Negative for polyuria.  Genitourinary: Negative for dysuria and flank pain.  Musculoskeletal: Negative for back pain, neck pain and neck stiffness.  Skin: Negative for rash.  Neurological: Negative for speech difficulty, numbness and headaches.  Hematological: Does not bruise/bleed easily.  Psychiatric/Behavioral: Positive for confusion.    Physical Exam Updated Vital  Signs BP (!) 149/57 (BP Location: Right Arm)   Pulse 65   Temp 98.3 F (36.8 C) (Oral)   Resp (!) 33   Ht 1.626 m (5\' 4" )   Wt 104.3 kg   SpO2 92%   BMI 39.48 kg/m   Physical Exam Vitals and nursing note reviewed.  Constitutional:      Appearance: Normal appearance. She is well-developed.  HENT:     Head: Atraumatic.     Nose: Nose normal.     Mouth/Throat:     Mouth: Mucous membranes are moist.  Eyes:     General: No scleral icterus.    Conjunctiva/sclera: Conjunctivae normal.     Pupils: Pupils are equal, round, and reactive to light.  Neck:     Vascular: No carotid bruit.     Trachea: No tracheal deviation.     Comments: No stiffness or rigidity.  Cardiovascular:     Rate and Rhythm: Normal rate and regular rhythm.     Pulses: Normal pulses.     Heart sounds: Normal heart sounds. No murmur heard.  No friction rub. No gallop.   Pulmonary:     Effort: Pulmonary effort is normal. No respiratory distress.     Breath sounds: Rhonchi present.  Abdominal:     General: Bowel sounds are normal. There is no distension.     Palpations: Abdomen is soft.     Tenderness: There is no abdominal tenderness. There is no guarding.  Genitourinary:    Comments: No cva tenderness.  Musculoskeletal:     Cervical back: Normal range of motion and neck supple. No rigidity or tenderness. No muscular tenderness.     Comments: Symmetric bilateral foot, ankle and lower leg edema.   Skin:    General: Skin is warm and dry.     Findings: No rash.  Neurological:     Mental Status: She is alert.     Comments: Alert, speech normal. No dysarthria. Motor intact bil, stre 5/5. sens grossly intact.   Psychiatric:        Mood and Affect: Mood normal.  ED Results / Procedures / Treatments   Labs (all labs ordered are listed, but only abnormal results are displayed) Results for orders placed or performed during the hospital encounter of 06/05/20  SARS Coronavirus 2 by RT PCR (hospital  order, performed in [place] hospital lab) Nasopharyngeal Nasopharyngeal Swab   Specimen: Nasopharyngeal Swab  Result Value Ref Range   SARS Coronavirus 2 NEGATIVE NEGATIVE  Lactic acid, plasma  Result Value Ref Range   Lactic Acid, Venous 0.6 0.5 - 1.9 mmol/L  CBC  Result Value Ref Range   WBC 8.9 4.0 - 10.5 K/uL   RBC 2.87 (L) 3.87 - 5.11 MIL/uL   Hemoglobin 8.1 (L) 12.0 - 15.0 g/dL   HCT 27.6 (L) 36 - 46 %   MCV 96.2 80.0 - 100.0 fL   MCH 28.2 26.0 - 34.0 pg   MCHC 29.3 (L) 30.0 - 36.0 g/dL   RDW 15.7 (H) 11.5 - 15.5 %   Platelets 458 (H) 150 - 400 K/uL   nRBC 0.0 0.0 - 0.2 %  Comprehensive metabolic panel  Result Value Ref Range   Sodium 143 135 - 145 mmol/L   Potassium 4.8 3.5 - 5.1 mmol/L   Chloride 111 98 - 111 mmol/L   CO2 21 (L) 22 - 32 mmol/L   Glucose, Bld 169 (H) 70 - 99 mg/dL   BUN 94 (H) 8 - 23 mg/dL   Creatinine, Ser 4.05 (H) 0.44 - 1.00 mg/dL   Calcium 8.6 (L) 8.9 - 10.3 mg/dL   Total Protein 7.1 6.5 - 8.1 g/dL   Albumin 3.3 (L) 3.5 - 5.0 g/dL   AST 61 (H) 15 - 41 U/L   ALT 113 (H) 0 - 44 U/L   Alkaline Phosphatase 79 38 - 126 U/L   Total Bilirubin 0.5 0.3 - 1.2 mg/dL   GFR calc non Af Amer 10 (L) >60 mL/min   GFR calc Af Amer 12 (L) >60 mL/min   Anion gap 11 5 - 15  Brain natriuretic peptide  Result Value Ref Range   B Natriuretic Peptide 1,406.0 (H) 0.0 - 100.0 pg/mL  Vitamin B12  Result Value Ref Range   Vitamin B-12 2,304 (H) 180 - 914 pg/mL  Folate  Result Value Ref Range   Folate 10.8 >5.9 ng/mL  Iron and TIBC  Result Value Ref Range   Iron 35 28 - 170 ug/dL   TIBC 271 250 - 450 ug/dL   Saturation Ratios 13 10.4 - 31.8 %   UIBC 236 ug/dL  Ferritin  Result Value Ref Range   Ferritin 75 11 - 307 ng/mL  Reticulocytes  Result Value Ref Range   Retic Ct Pct 2.3 0.4 - 3.1 %   RBC. 2.88 (L) 3.87 - 5.11 MIL/uL   Retic Count, Absolute 67.4 19.0 - 186.0 K/uL   Immature Retic Fract 25.3 (H) 2.3 - 15.9 %  Type and screen  Result Value Ref  Range   ABO/RH(D) A POS    Antibody Screen NEG    Sample Expiration      06/08/2020,2359 Performed at [facility], [address], [place], [place] [address]   Troponin I (High Sensitivity)  Result Value Ref Range   Troponin I (High Sensitivity) 49 (H) <18 ng/L  Troponin I (High Sensitivity)  Result Value Ref Range   Troponin I (High Sensitivity) 48 (H) <18 ng/L   DG Chest 2 View  Result Date: 05/28/2020 CLINICAL DATA:  Shortness of breath. EXAM: CHEST - 2 VIEW  COMPARISON:  05/26/2020 FINDINGS: The cardio pericardial silhouette is enlarged. Patchy airspace disease in the right upper lung appears mildly improved in the interval. Patchy retrocardiac airspace opacity at the left base noted on today's study. No pleural effusion. Telemetry leads overlie the chest. IMPRESSION: Interval improvement in patchy airspace disease in the right upper lung with retrocardiac patchy airspace disease noted at the left base. Electronically Signed   By: Misty Stanley M.D.   On: 05/28/2020 10:08   DG Chest 2 View  Result Date: 05/26/2020 CLINICAL DATA:  SOB with cough. EXAM: CHEST - 2 VIEW COMPARISON:  05/24/2020 FINDINGS: Left chest wall pacer is noted with leads in the right atrial appendage, right ventricle and coronary sinus. Stable cardiomediastinal contours. Extensive airspace disease within the right upper lobe is again noted and appears unchanged. Left lung is clear. No pleural effusion or edema identified. IMPRESSION: 1. No change in right upper lobe pneumonia. Electronically Signed   By: Kerby Moors M.D.   On: 05/26/2020 10:40   DG Chest 2 View  Result Date: 05/24/2020 CLINICAL DATA:  Shortness of breath. EXAM: CHEST - 2 VIEW COMPARISON:  May 22, 2020. FINDINGS: Stable cardiomegaly. No pneumothorax or pleural effusion is noted. Left-sided pacemaker is unchanged in position. Left lung is clear. Increased right upper lobe airspace opacity is noted concerning for worsening pneumonia. Bony thorax is  unremarkable. IMPRESSION: Worsening right upper lobe pneumonia. Electronically Signed   By: Marijo Conception M.D.   On: 05/24/2020 10:00   CT HEAD WO CONTRAST  Result Date: 06/05/2020 CLINICAL DATA:  Altered mental status EXAM: CT HEAD WITHOUT CONTRAST TECHNIQUE: Contiguous axial images were obtained from the base of the skull through the vertex without intravenous contrast. COMPARISON:  None. FINDINGS: Brain: There is age related volume loss. There is no intracranial mass, hemorrhage, extra-axial fluid collection, or midline shift. There is evidence of a prior infarct in the superior left parietal lobe. There is mild periventricular small vessel disease in the centra semiovale bilaterally. No acute appearing infarct is demonstrable on this study. Vascular: There is no hyperdense vessel. There is calcification in each carotid siphon region. Skull: The bony calvarium appears intact. Sinuses/Orbits: There is mild mucosal thickening in several ethmoid air cells. Other visualized paranasal sinuses are clear. Visualized orbits appear symmetric bilaterally. Other: Visualized mastoid air cells are clear. IMPRESSION: 1. Prior left parietal lobe infarct. Mild periventricular small vessel disease. No acute infarct demonstrable on this study. No mass or hemorrhage. 2.  There are foci of arterial vascular calcification. 3.  There is mild mucosal thickening in several ethmoid air cells. Electronically Signed   By: Lowella Grip III M.D.   On: 06/05/2020 08:41   US Venous Img Lower Bilateral (DVT)  Result Date: 05/23/2020 CLINICAL DATA:  History of pulmonary embolism. Evaluate for lower extremity DVT. EXAM: BILATERAL LOWER EXTREMITY VENOUS DOPPLER ULTRASOUND TECHNIQUE: Gray-scale sonography with graded compression, as well as color Doppler and duplex ultrasound were performed to evaluate the lower extremity deep venous systems from the level of the common femoral vein and including the common femoral, femoral, profunda  femoral, popliteal and calf veins including the posterior tibial, peroneal and gastrocnemius veins when visible. The superficial great saphenous vein was also interrogated. Spectral Doppler was utilized to evaluate flow at rest and with distal augmentation maneuvers in the common femoral, femoral and popliteal veins. COMPARISON:  12/22/2019 FINDINGS: RIGHT LOWER EXTREMITY Common Femoral Vein: No evidence of thrombus. Normal compressibility, respiratory phasicity and response to augmentation. Saphenofemoral  Junction: No evidence of thrombus. Normal compressibility and flow on color Doppler imaging. Profunda Femoral Vein: No evidence of thrombus. Normal compressibility and flow on color Doppler imaging. Femoral Vein: No evidence of thrombus. Normal compressibility, respiratory phasicity and response to augmentation. Popliteal Vein: No evidence of thrombus. Normal compressibility, respiratory phasicity and response to augmentation. Calf Veins: Visualized right deep calf veins are patent without thrombus. Limited evaluation. Superficial Great Saphenous Vein: No evidence of thrombus. Normal compressibility. Other Findings:  None. LEFT LOWER EXTREMITY Common Femoral Vein: No evidence of thrombus. Normal compressibility, respiratory phasicity and response to augmentation. Saphenofemoral Junction: No evidence of thrombus. Normal compressibility and flow on color Doppler imaging. Profunda Femoral Vein: No evidence of thrombus. Normal compressibility and flow on color Doppler imaging. Femoral Vein: No evidence of thrombus. Normal compressibility, respiratory phasicity and response to augmentation. Popliteal Vein: No evidence of thrombus. Normal compressibility, respiratory phasicity and response to augmentation. Calf Veins: Visualized left deep calf veins are patent without thrombus. Limited evaluation. Superficial Great Saphenous Vein: No evidence of thrombus. Normal compressibility. Other Findings:  None. IMPRESSION: No  evidence of deep venous thrombosis in either lower extremity. Electronically Signed   By: Markus Daft M.D.   On: 05/23/2020 12:21   DG Chest Port 1 View  Result Date: 06/05/2020 CLINICAL DATA:  Altered mental status EXAM: PORTABLE CHEST 1 VIEW COMPARISON:  May 28, 2020 FINDINGS: There is airspace opacity in each upper lobe region, similar on the right and increased on the left. Lungs otherwise are clear. There is cardiomegaly with pulmonary venous hypertension. Pacemaker leads are attached to the right atrium, right ventricle, and coronary sinus. No adenopathy. No bone lesions. IMPRESSION: Bilateral upper lobe airspace opacity, new on the left and similar on the right. Question atypical organism pneumonia. Stable cardiomegaly with a degree of pulmonary vascular congestion. Stable pacemaker lead placements. No adenopathy evident by radiography. Electronically Signed   By: Lowella Grip III M.D.   On: 06/05/2020 08:38   DG Chest Portable 1 View  Result Date: 05/22/2020 CLINICAL DATA:  Shortness of breath, hypoxemia, end-stage renal disease on dialysis, LEFT-sided crackles, RIGHT-side week EXAM: PORTABLE CHEST 1 VIEW COMPARISON:  Portable exam 1042 hours compared to 12/21/2019 FINDINGS: LEFT subclavian sequential pacemaker with leads projecting over RIGHT atrium, RIGHT ventricle, and coronary sinus. Enlargement of cardiac silhouette with vascular congestion. Minimal LEFT base atelectasis. RIGHT perihilar opacities extending into RIGHT upper lobe favor pneumonia although requiring follow-up until resolution to exclude underlying nodule. Question mild enlargement of RIGHT hilum versus perihilar infiltrate. Remaining lungs clear. No pleural effusion or pneumothorax. Bones demineralized. IMPRESSION: Enlargement of cardiac silhouette post pacemaker. Questionable RIGHT hilar enlargement/adenopathy versus perihilar infiltrate. RIGHT upper lobe pneumonia; follow-up exams until resolution recommended to exclude  underlying pulmonary mass/nodule and hilar adenopathy. Electronically Signed   By: Lavonia Dana M.D.   On: 05/22/2020 11:16   ECHOCARDIOGRAM COMPLETE  Result Date: 05/23/2020    ECHOCARDIOGRAM REPORT   Patient Name:   Cassandra Parker Date of Exam: 05/23/2020 Medical Rec #:  [id number]          Height:       63.0 in Accession #:    [id number]         Weight:       233.0 lb Date of Birth:  [birth year]         BSA:          2.063 m Patient Age:    16 years  BP:           148/63 mmHg Patient Gender: F                  HR:           83 bpm. Exam Location:  [facility] Procedure: 2D Echo, Cardiac Doppler and Color Doppler Indications:    Dyspnea  History:        Patient has prior history of Echocardiogram examinations, most                 recent 12/23/2019. CHF, Pacemaker, Mitral Valve Disease,                 Signs/Symptoms:Shortness of Breath; Risk Factors:Diabetes and                 Hypertension. Sepsis, pneumonia.  Sonographer:    Dustin Flock RDCS Referring Phys: [place]  1. Left ventricular ejection fraction, by estimation, is 60 to 65%. The left ventricle has normal function. The left ventricle has no regional wall motion abnormalities. There is moderate left ventricular hypertrophy. Left ventricular diastolic parameters are consistent with Grade I diastolic dysfunction (impaired relaxation).  2. Right ventricular systolic function is normal. The right ventricular size is normal. There is severely elevated pulmonary artery systolic pressure. The estimated right ventricular systolic pressure is 93.7 mmHg. Device wire present.  3. Left atrial size was mildly dilated.  4. The mitral valve is abnormal, mildly calcified and with moderate annular calcification. Mild mitral valve regurgitation.  5. The aortic valve is tricuspid. Aortic valve regurgitation is not visualized.  6. The inferior vena cava is dilated in size with <50% respiratory variability, suggesting right atrial  pressure of 15 mmHg. FINDINGS  Left Ventricle: Left ventricular ejection fraction, by estimation, is 60 to 65%. The left ventricle has normal function. The left ventricle has no regional wall motion abnormalities. The left ventricular internal cavity size was normal in size. There is  moderate left ventricular hypertrophy. Left ventricular diastolic parameters are consistent with Grade I diastolic dysfunction (impaired relaxation). Right Ventricle: The right ventricular size is normal. No increase in right ventricular wall thickness. Right ventricular systolic function is normal. There is severely elevated pulmonary artery systolic pressure. The tricuspid regurgitant velocity is 3.49 m/s, and with an assumed right atrial pressure of 15 mmHg, the estimated right ventricular systolic pressure is 16.9 mmHg. Left Atrium: Left atrial size was mildly dilated. Right Atrium: Right atrial size was normal in size. Pericardium: There is no evidence of pericardial effusion. Mitral Valve: The mitral valve is abnormal. There is mild calcification of the mitral valve leaflet(s). Moderate mitral annular calcification. Mild mitral valve regurgitation. Tricuspid Valve: The tricuspid valve is grossly normal. Tricuspid valve regurgitation is mild. Aortic Valve: The aortic valve is tricuspid. Aortic valve regurgitation is not visualized. Mild aortic valve annular calcification. Pulmonic Valve: The pulmonic valve was grossly normal. Pulmonic valve regurgitation is mild. Aorta: The aortic root is normal in size and structure. Venous: The inferior vena cava is dilated in size with less than 50% respiratory variability, suggesting right atrial pressure of 15 mmHg. IAS/Shunts: No atrial level shunt detected by color flow Doppler. Additional Comments: A pacer wire is visualized.  LEFT VENTRICLE PLAX 2D LVIDd:         4.24 cm  Diastology LVIDs:         3.01 cm  LV e' lateral:   3.59 cm/s LV PW:  1.50 cm  LV E/e' lateral: 33.4 LV IVS:         1.52 cm  LV e' medial:    5.44 cm/s LVOT diam:     2.00 cm  LV E/e' medial:  22.1 LV SV:         93 LV SV Index:   45 LVOT Area:     3.14 cm  RIGHT VENTRICLE RV Basal diam:  3.78 cm RV S prime:     9.90 cm/s TAPSE (M-mode): 3.2 cm LEFT ATRIUM             Index       RIGHT ATRIUM           Index LA diam:        4.30 cm 2.08 cm/m  RA Area:     21.90 cm LA Vol (A2C):   52.2 ml 25.30 ml/m RA Volume:   67.60 ml  32.76 ml/m LA Vol (A4C):   74.4 ml 36.06 ml/m LA Biplane Vol: 69.1 ml 33.49 ml/m  AORTIC VALVE LVOT Vmax:   130.00 cm/s LVOT Vmean:  96.800 cm/s LVOT VTI:    0.295 m  AORTA Ao Root diam: 3.00 cm MITRAL VALVE                TRICUSPID VALVE MV Area (PHT): 3.28 cm     TR Peak grad:   48.7 mmHg MV Decel Time: 231 msec     TR Vmax:        349.00 cm/s MV E velocity: 120.00 cm/s MV A velocity: 125.00 cm/s  SHUNTS MV E/A ratio:  0.96         Systemic VTI:  0.30 m                             Systemic Diam: 2.00 cm Rozann Lesches MD Electronically signed by Rozann Lesches MD Signature Date/Time: 05/23/2020/11:07:51 AM    Final     EKG EKG Interpretation  Date/Time:  Saturday June 05 2020 07:34:37 EDT Ventricular Rate:  74 PR Interval:    QRS Duration: 121 QT Interval:  459 QTC Calculation: 510 R Axis:   -63 Text Interpretation: Atrial-sensed ventricular-paced rhythm No further analysis attempted due to paced rhythm Confirmed by Lajean Saver [phone]) on 06/05/2020 7:41:57 AM   Radiology CT HEAD WO CONTRAST  Result Date: 06/05/2020 CLINICAL DATA:  Altered mental status EXAM: CT HEAD WITHOUT CONTRAST TECHNIQUE: Contiguous axial images were obtained from the base of the skull through the vertex without intravenous contrast. COMPARISON:  None. FINDINGS: Brain: There is age related volume loss. There is no intracranial mass, hemorrhage, extra-axial fluid collection, or midline shift. There is evidence of a prior infarct in the superior left parietal lobe. There is mild periventricular small  vessel disease in the centra semiovale bilaterally. No acute appearing infarct is demonstrable on this study. Vascular: There is no hyperdense vessel. There is calcification in each carotid siphon region. Skull: The bony calvarium appears intact. Sinuses/Orbits: There is mild mucosal thickening in several ethmoid air cells. Other visualized paranasal sinuses are clear. Visualized orbits appear symmetric bilaterally. Other: Visualized mastoid air cells are clear. IMPRESSION: 1. Prior left parietal lobe infarct. Mild periventricular small vessel disease. No acute infarct demonstrable on this study. No mass or hemorrhage. 2.  There are foci of arterial vascular calcification. 3.  There is mild mucosal thickening in several ethmoid air cells. Electronically Signed   By: Gwyndolyn Saxon  Jasmine December III M.D.   On: 06/05/2020 08:41   DG Chest Port 1 View  Result Date: 06/05/2020 CLINICAL DATA:  Altered mental status EXAM: PORTABLE CHEST 1 VIEW COMPARISON:  May 28, 2020 FINDINGS: There is airspace opacity in each upper lobe region, similar on the right and increased on the left. Lungs otherwise are clear. There is cardiomegaly with pulmonary venous hypertension. Pacemaker leads are attached to the right atrium, right ventricle, and coronary sinus. No adenopathy. No bone lesions. IMPRESSION: Bilateral upper lobe airspace opacity, new on the left and similar on the right. Question atypical organism pneumonia. Stable cardiomegaly with a degree of pulmonary vascular congestion. Stable pacemaker lead placements. No adenopathy evident by radiography. Electronically Signed   By: Lowella Grip III M.D.   On: 06/05/2020 08:38    Procedures Procedures (including critical care time)  Medications Ordered in ED Medications - No data to display  ED Course  I have reviewed the triage vital signs and the nursing notes.  Pertinent labs & imaging results that were available during my care of the patient were reviewed by me and  considered in my medical decision making (see chart for details).    MDM Rules/Calculators/A&P                          Continuous pulse ox and monitor. Stat labs. Ecg. CT. Pcxr.   Reviewed nursing notes and prior charts for additional history.  Recent hospitalization reviewed.   MDM Number of Diagnoses or Management Options   Amount and/or Complexity of Data Reviewed Clinical lab tests: ordered and reviewed Tests in the radiology section of CPT: ordered and reviewed Tests in the medicine section of CPT: ordered and reviewed Discussion of test results with the performing providers: yes Decide to obtain previous medical records or to obtain history from someone other than the patient: yes Obtain history from someone other than the patient: yes Review and summarize past medical records: yes Discuss the patient with other providers: yes Independent visualization of images, tracings, or specimens: yes  Risk of Complications, Morbidity, and/or Mortality Presenting problems: high Diagnostic procedures: high Management options: high   Initial labs reviewed/interpreted by me - chem normal.  CT reviewed/interpreted by me - no hem.   CXR reviewed/interpreted by me - no new pna.   Additional labs reviewed/interpreted by me - delta trop not increasing. Recheck pt, no cp or discomfort.   Given mild worsening renal fxn/AKI, and markedly increased bun compared to prior (?uremia causing confusion and gen weakness), and new/increased infiltrate on cxr - will consult hospitalists for admission.     Final Clinical Impression(s) / ED Diagnoses Final diagnoses:  None    Rx / DC Orders ED Discharge Orders    None       Lajean Saver, MD 06/05/20 1157

## 2020-06-05 NOTE — H&P (Signed)
History and Physical  Cassandra Parker QIH:[id number] DOB: 1941/08/25 DOA: 06/05/2020   PCP: Manon Hilding, MD   Patient coming from: Home  Chief Complaint: Confusion  HPI:  Cassandra Parker is a 79 y.o. female with medical history of diabetes mellitus type 2, CKD stage V, hypertension, hyperlipidemia, complete heart block status post PPM, pulmonary hypertension, right breast cancer status postmastectomy, diastolic CHF, and PE presenting with confusion.  The patient was recently hospitalized from 05/22/2020 through 05/28/2020 secondary to sepsis from pneumonia.  Her hospitalization was complicated by increasing serum creatinine for which her furosemide was held.  It was ultimately restarted at the time of discharge, 40 mg p.o. daily.  Her hemoglobin also trended down during the hospitalization.  GI was consulted, I recommended endoscopy after the patient has been stabilized from a respiratory standpoint.  Her apixaban was ultimately discontinued.  She had received approximately 5 months of apixaban for her PE.  She was discharged once her hemoglobin stabilized and there was no evidence of frank active bleeding.  She was discharged home on 3 L nasal cannula with cefdinir and azithromycin. Since her discharge home, the patient has had decreased oral intake.  She has been using a walker and a cane to ambulate.  However, the patient's family noted her to be increasingly confused with increased generalized weakness in the 24 hours prior to bring her back to the emergency department on 06/05/2020.  The patient is a poor historian, but denies any headache, fevers, chills, chest pain, shortness of breath, nausea, vomiting, diarrhea, abdominal pain, dysuria.  Family states that her edema has not significantly worsened since going home.  However, the family states that the patient has had to sleep in a recliner since returning home because she "feels uncomfortable" laying in bed. In the emergency  department, patient was afebrile hemodynamically stable with oxygen saturation 100% 3 L.  BMP showed a potassium 4.8 and serum creatinine 4.05 and BUN 94.  AST 61, ALT 113, alk phosphatase 79, total bilirubin 0.5.  WBC was 8.9 with hemoglobin 8.1 and platelets 150,000.  Lactic acid was 0.6.  BNP was 1406.  CT of the brain was negative for any acute findings.  Chest x-ray showed bilateral upper space opacities new on the left.  There is pulmonary vascular congestion.   Assessment/Plan: Acute metabolic encephalopathy -This is primarily being driven by uremia from her worsening renal function -Check folic OVFI--43.3 -I95 1884 -Check TSH -Check ammonia -UA negative for pyuria  Acute on chronic renal failure--CKD stage V -Baseline creatinine 3.3-3.6 -BUN up to 94 -Nephrology consulted -Right upper extremity fistula placed 05/18/2020  Fluid overload -The patient is clinically fluid overloaded with pulmonary vascular congestion and peripheral edema -Start furosemide 80 mg IV twice daily -Nephrology consulted  Pulmonary infiltrates -I suspect that this is primarily fluid overload with lower suspicion of pneumonia as the patient is afebrile without leukocytosis -Check procalcitonin -Plan to broaden antibiotic coverage for nosocomial organisms if procalcitonin is elevated  Chronic respiratory failure with hypoxia -The patient was discharged home on 3 L nasal cannula -Currently stable on 3 L  Pulmonary hypertension -She has an appointment to follow-up with pulmonary medicine in the outpatient setting -05/23/2020 echo EF 60 to 65%, no WMA, RVSP 63.7  Diabetes mellitus type 2 -NovoLog sliding scale -Holding glipizide -05/22/2020 hemoglobin A1c 7.4  Essential hypertension -Restart carvedilol -Holding amlodipine, hydralazine to allow blood pressure margin for diuresis  Complete heart block  -Status post PPM  Anemia of CKD -Baseline hemoglobin 8-9 -Apixaban was discontinued at the time  of her discharge from the last hospitalization -Hemoglobin has remained stable since her discharge -Iron saturation 13%, ferritin 75  Hyperlipidemia -Restart statin  History ofRt Breast cancer -S/p chemo and Rt mastectomy 2008 Status post right-sided mastectomy and chemotherapy -Stable, follow-up as outpatient        Past Medical History:  Diagnosis Date  . Acute renal insufficiency 10/15/2013  . Arthritis   . Cancer [facility]) 2008   right breast  . Cancer of breast- s/p chemo and Rt mastectomy 2008 10/13/2013  . Cardiomyopathy ([place])    dating back to 2010  . CHB (complete heart block) ([place]) 10/13/2013  . Chronic combined systolic and diastolic congestive heart failure ([place])   . Diabetes mellitus    with neuropathy  . Diabetes mellitus type 2, controlled, without complications ([place]) 11/30/6576  . Dyslipidemia 10/13/2013  . History of blood transfusion   . Hypercholesterolemia   . Hypertension   . Mitral stenosis- moderate with trivial MR  10/15/2013  . Pacemaker 10/23/2014  . Pneumonia   . Presence of permanent cardiac pacemaker    [employer]  . Primary osteoarthritis of left knee 11/04/2015  . Syncope 10/13/2013  . Tingling    feet bilat    Past Surgical History:  Procedure Laterality Date  . APPENDECTOMY     mmh  . AV FISTULA PLACEMENT Right 05/18/2020   Procedure: RIGHT ARM BRACHIOCEPHALIC ARTERIOVENOUS (AV) FISTULA CREATION;  Surgeon: Elam Dutch, MD;  Location: [facility];  Service: Vascular;  Laterality: Right;  . BI-VENTRICULAR PACEMAKER INSERTION N/A 10/15/2013   Procedure: BI-VENTRICULAR PACEMAKER INSERTION (CRT-P);  Surgeon: Evans Lance, MD;  Location: [facility] CATH LAB;  Service: Cardiovascular;  Laterality: N/A;  . Cardiomyopathy    . CATARACT EXTRACTION W/PHACO  06/29/2011   Procedure: CATARACT EXTRACTION PHACO AND INTRAOCULAR LENS PLACEMENT (IOC);  Surgeon: Tonny Branch;  Location: [facility];  Service: Ophthalmology;  Laterality: Right;  CDE: 14.41  . CATARACT EXTRACTION  W/PHACO  09/04/2011   Procedure: CATARACT EXTRACTION PHACO AND INTRAOCULAR LENS PLACEMENT (IOC);  Surgeon: Tonny Branch;  Location: [facility];  Service: Ophthalmology;  Laterality: Left;  CDE: 15.21  . CHOLECYSTECTOMY     mmh  . JOINT REPLACEMENT Left    knee  . KNEE ARTHROSCOPY     right-mmh  . MASTECTOMY  2008    mmh-right  . TEMPORARY PACEMAKER INSERTION N/A 10/13/2013   Procedure: TEMPORARY PACEMAKER INSERTION;  Surgeon: Jolaine Artist, MD;  Location: [facility] CATH LAB;  Service: Cardiovascular;  Laterality: N/A;  . TOTAL KNEE ARTHROPLASTY Left 11/04/2015   Procedure: LEFT TOTAL KNEE ARTHROPLASTY;  Surgeon: Rod Can, MD;  Location: [facility];  Service: Orthopedics;  Laterality: Left;   Social History:  reports that she has never smoked. She has never used smokeless tobacco. She reports that she does not drink alcohol and does not use drugs.   Family History  Problem Relation Age of Onset  . Hypertension Father   . Diabetes Father   . Anesthesia problems Neg Hx   . Hypotension Neg Hx   . Malignant hyperthermia Neg Hx   . Pseudochol deficiency Neg Hx   . Colon cancer Neg Hx   . Colon polyps Neg Hx      No Known Allergies   Prior to Admission medications   Medication Sig Start Date End Date Taking? Authorizing Provider  acetaminophen (TYLENOL) 325 MG tablet Take 2 tablets (650 mg total)  by mouth every 6 (six) hours as needed for mild pain or headache (or Fever >/= 101). 12/23/19   Roxan Hockey, MD  albuterol (PROVENTIL) (2.5 MG/3ML) 0.083% nebulizer solution Take 3 mLs (2.5 mg total) by nebulization every 2 (two) hours as needed for wheezing or shortness of breath. 05/28/20   Roxan Hockey, MD  amLODipine (NORVASC) 10 MG tablet Take 1 tablet (10 mg total) by mouth daily. 05/28/20   Roxan Hockey, MD  carvedilol (COREG) 6.25 MG tablet Take 1 tablet (6.25 mg total) by mouth 2 (two) times daily. 05/28/20   Roxan Hockey, MD  Cholecalciferol (VITAMIN D) 2000 UNITS tablet Take  2,000 Units by mouth daily.     [provider]  furosemide (LASIX) 40 MG tablet Take 1 tablet (40 mg total) by mouth daily. 05/28/20   Emokpae, Courage, MD  glipiZIDE (GLUCOTROL XL) 5 MG 24 hr tablet Take 5 mg by mouth See admin instructions. Take 10 mg in the morning and 5 mg at night    [provider]  guaiFENesin (MUCINEX) 600 MG 12 hr tablet Take 1 tablet (600 mg total) by mouth 2 (two) times daily. 05/28/20   Roxan Hockey, MD  hydrALAZINE (APRESOLINE) 50 MG tablet Take 50 mg by mouth 2 (two) times daily.    [provider]  HYDROcodone-acetaminophen (NORCO) 5-325 MG tablet Take 1 tablet by mouth every 6 (six) hours as needed for moderate pain. 05/18/20   Dagoberto Ligas, PA-C  isosorbide mononitrate (IMDUR) 30 MG 24 hr tablet Take 1 tablet (30 mg total) by mouth daily. 05/28/20 05/28/21  Roxan Hockey, MD  pantoprazole (PROTONIX) 40 MG tablet Take 1 tablet (40 mg total) by mouth daily. 05/28/20   Roxan Hockey, MD  potassium chloride (KLOR-CON) 10 MEQ tablet Take 10 mEq by mouth daily.     [provider]  pravastatin (PRAVACHOL) 10 MG tablet Take 10 mg by mouth at bedtime.     [provider]  vitamin B-12 (CYANOCOBALAMIN) 1000 MCG tablet Take 1,000 mcg by mouth daily.    [provider]    Review of Systems:  Constitutional:  No weight loss, night sweats, Fevers, chills,   Head&Eyes: No headache.  No vision loss.  No eye pain or scotoma ENT:  No Difficulty swallowing,Tooth/dental problems,Sore throat,  No ear ache, post nasal drip,  Cardio-vascular:  No chest pain, Orthopnea, PND, swelling in lower extremities,  dizziness, palpitations  GI:  No  abdominal pain, nausea, vomiting, diarrhea, loss of appetite, hematochezia, melena, heartburn, indigestion, Resp:  No shortness of breath with exertion or at rest. No cough. No coughing up of blood .No wheezing.No chest wall deformity  Skin:  no rash or lesions.  GU:  no dysuria,  change in color of urine, no urgency or frequency. No flank pain.  Musculoskeletal:  No joint pain or swelling. No decreased range of motion. No back pain.  Psych:  No change in mood or affect. No depression or anxiety. Neurologic: No headache, no dysesthesia, no focal weakness, no vision loss. No syncope  Physical Exam: Vitals:   06/05/20 0739 06/05/20 0845 06/05/20 1030 06/05/20 1100  BP: (!) 149/57  132/64 (!) 134/57  Pulse: 65  68 69  Resp: (!) 33  19 20  Temp: 98.3 F (36.8 C) (!) 97.3 F (36.3 C)    TempSrc: Oral Rectal    SpO2: 92%  97% 100%  Weight:      Height:       General:  A&O x  1, NAD, nontoxic, pleasant/cooperative Head/Eye: No conjunctival hemorrhage, no icterus, [place]/AT, No nystagmus ENT:  No icterus,  No thrush, good dentition, no pharyngeal exudate Neck:  No masses, no lymphadenpathy, no bruits CV:  RRR, no rub, no gallop, no S3 Lung: Bibasilar crackles.  No wheezing Abdomen: soft/NT, +BS, nondistended, no peritoneal signs Ext: No cyanosis, No rashes, No petechiae, No lymphangitis, 2+ LE edema Neuro: CNII-XII intact, strength 4/5 in bilateral upper and lower extremities, no dysmetria  Labs on Admission:  Basic Metabolic Panel: Recent Labs  Lab 06/05/20 0852  NA 143  K 4.8  CL 111  CO2 21*  GLUCOSE 169*  BUN 94*  CREATININE 4.05*  CALCIUM 8.6*   Liver Function Tests: Recent Labs  Lab 06/05/20 0852  AST 61*  ALT 113*  ALKPHOS 79  BILITOT 0.5  PROT 7.1  ALBUMIN 3.3*   No results for input(s): LIPASE, AMYLASE in the last 168 hours. No results for input(s): AMMONIA in the last 168 hours. CBC: Recent Labs  Lab 06/05/20 0852  WBC 8.9  HGB 8.1*  HCT 27.6*  MCV 96.2  PLT 458*   Coagulation Profile: No results for input(s): INR, PROTIME in the last 168 hours. Cardiac Enzymes: No results for input(s): CKTOTAL, CKMB, CKMBINDEX, TROPONINI in the last 168 hours. BNP: Invalid input(s): POCBNP CBG: No results for input(s): GLUCAP in the  last 168 hours. Urine analysis:    Component Value Date/Time   COLORURINE YELLOW 06/05/2020 0743   APPEARANCEUR HAZY (A) 06/05/2020 0743   LABSPEC 1.013 06/05/2020 0743   PHURINE 5.0 06/05/2020 0743   GLUCOSEU NEGATIVE 06/05/2020 0743   HGBUR SMALL (A) 06/05/2020 0743   BILIRUBINUR NEGATIVE 06/05/2020 0743   KETONESUR NEGATIVE 06/05/2020 0743   PROTEINUR 100 (A) 06/05/2020 0743   UROBILINOGEN 0.2 10/13/2013 2318   NITRITE NEGATIVE 06/05/2020 0743   LEUKOCYTESUR NEGATIVE 06/05/2020 0743   Sepsis Labs: $[id number]$ (procalcitonin:4,lacticidven:4) ) Recent Results (from the past 240 hour(s))  SARS Coronavirus 2 by RT PCR (hospital order, performed in [facility] hospital lab) Nasopharyngeal Nasopharyngeal Swab     Status: None   Collection Time: 06/05/20  7:58 AM   Specimen: Nasopharyngeal Swab  Result Value Ref Range Status   SARS Coronavirus 2 NEGATIVE NEGATIVE Final    Comment: (NOTE) SARS-CoV-2 target nucleic acids are NOT DETECTED.  The SARS-CoV-2 RNA is generally detectable in upper and lower respiratory specimens during the acute phase of infection. The lowest concentration of SARS-CoV-2 viral copies this assay can detect is 250 copies / mL. A negative result does not preclude SARS-CoV-2 infection and should not be used as the sole basis for treatment or other patient management decisions.  A negative result may occur with improper specimen collection / handling, submission of specimen other than nasopharyngeal swab, presence of viral mutation(s) within the areas targeted by this assay, and inadequate number of viral copies (<250 copies / mL). A negative result must be combined with clinical observations, patient history, and epidemiological information.  Fact Sheet for Patients:   StrictlyIdeas.no  Fact Sheet for Healthcare Providers: BankingDealers.co.za  This test is not yet approved or  cleared by the [place] FDA  and has been authorized for detection and/or diagnosis of SARS-CoV-2 by FDA under an Emergency Use Authorization (EUA).  This EUA will remain in effect (meaning this test can be used) for the duration of the COVID-19 declaration under Section 564(b)(1) of the Act, 21 U.S.C. section 360bbb-3(b)(1), unless the authorization is terminated or revoked sooner.  Performed at  [address] [place], [place] [address]      Radiological Exams on Admission: CT HEAD WO CONTRAST  Result Date: 06/05/2020 CLINICAL DATA:  Altered mental status EXAM: CT HEAD WITHOUT CONTRAST TECHNIQUE: Contiguous axial images were obtained from the base of the skull through the vertex without intravenous contrast. COMPARISON:  None. FINDINGS: Brain: There is age related volume loss. There is no intracranial mass, hemorrhage, extra-axial fluid collection, or midline shift. There is evidence of a prior infarct in the superior left parietal lobe. There is mild periventricular small vessel disease in the centra semiovale bilaterally. No acute appearing infarct is demonstrable on this study. Vascular: There is no hyperdense vessel. There is calcification in each carotid siphon region. Skull: The bony calvarium appears intact. Sinuses/Orbits: There is mild mucosal thickening in several ethmoid air cells. Other visualized paranasal sinuses are clear. Visualized orbits appear symmetric bilaterally. Other: Visualized mastoid air cells are clear. IMPRESSION: 1. Prior left parietal lobe infarct. Mild periventricular small vessel disease. No acute infarct demonstrable on this study. No mass or hemorrhage. 2.  There are foci of arterial vascular calcification. 3.  There is mild mucosal thickening in several ethmoid air cells. Electronically Signed   By: Lowella Grip III M.D.   On: 06/05/2020 08:41   DG Chest Port 1 View  Result Date: 06/05/2020 CLINICAL DATA:  Altered mental status EXAM: PORTABLE CHEST 1 VIEW COMPARISON:   May 28, 2020 FINDINGS: There is airspace opacity in each upper lobe region, similar on the right and increased on the left. Lungs otherwise are clear. There is cardiomegaly with pulmonary venous hypertension. Pacemaker leads are attached to the right atrium, right ventricle, and coronary sinus. No adenopathy. No bone lesions. IMPRESSION: Bilateral upper lobe airspace opacity, new on the left and similar on the right. Question atypical organism pneumonia. Stable cardiomegaly with a degree of pulmonary vascular congestion. Stable pacemaker lead placements. No adenopathy evident by radiography. Electronically Signed   By: Lowella Grip III M.D.   On: 06/05/2020 08:38    EKG: Independently reviewed. V-paced    Time spent:70 minutes Code Status:   FULL Family Communication:  Daughter updated at bedside 9/11 Disposition Plan: expect 2-3 day hospitalization Consults called: renal DVT Prophylaxis: [place] Heparin    Orson Eva, DO  [facility] Pager [phone]  If 7PM-7AM, please contact night-coverage [email or web address] Password [facility] 06/05/2020, 1:04 PM

## 2020-06-06 ENCOUNTER — Other Ambulatory Visit: Payer: Self-pay

## 2020-06-06 ENCOUNTER — Observation Stay (HOSPITAL_COMMUNITY): Payer: Medicare HMO

## 2020-06-06 DIAGNOSIS — N185 Chronic kidney disease, stage 5: Secondary | ICD-10-CM | POA: Diagnosis present

## 2020-06-06 DIAGNOSIS — G9341 Metabolic encephalopathy: Secondary | ICD-10-CM

## 2020-06-06 DIAGNOSIS — Z20822 Contact with and (suspected) exposure to covid-19: Secondary | ICD-10-CM | POA: Diagnosis present

## 2020-06-06 DIAGNOSIS — J9611 Chronic respiratory failure with hypoxia: Secondary | ICD-10-CM | POA: Diagnosis present

## 2020-06-06 DIAGNOSIS — Z9011 Acquired absence of right breast and nipple: Secondary | ICD-10-CM | POA: Diagnosis not present

## 2020-06-06 DIAGNOSIS — Z950 Presence of cardiac pacemaker: Secondary | ICD-10-CM | POA: Diagnosis not present

## 2020-06-06 DIAGNOSIS — E7800 Pure hypercholesterolemia, unspecified: Secondary | ICD-10-CM | POA: Diagnosis present

## 2020-06-06 DIAGNOSIS — I428 Other cardiomyopathies: Secondary | ICD-10-CM | POA: Diagnosis present

## 2020-06-06 DIAGNOSIS — Z9221 Personal history of antineoplastic chemotherapy: Secondary | ICD-10-CM | POA: Diagnosis not present

## 2020-06-06 DIAGNOSIS — N184 Chronic kidney disease, stage 4 (severe): Secondary | ICD-10-CM | POA: Diagnosis not present

## 2020-06-06 DIAGNOSIS — E1122 Type 2 diabetes mellitus with diabetic chronic kidney disease: Secondary | ICD-10-CM | POA: Diagnosis present

## 2020-06-06 DIAGNOSIS — I442 Atrioventricular block, complete: Secondary | ICD-10-CM | POA: Diagnosis present

## 2020-06-06 DIAGNOSIS — I272 Pulmonary hypertension, unspecified: Secondary | ICD-10-CM | POA: Diagnosis present

## 2020-06-06 DIAGNOSIS — Z79899 Other long term (current) drug therapy: Secondary | ICD-10-CM | POA: Diagnosis not present

## 2020-06-06 DIAGNOSIS — E785 Hyperlipidemia, unspecified: Secondary | ICD-10-CM | POA: Diagnosis present

## 2020-06-06 DIAGNOSIS — J189 Pneumonia, unspecified organism: Secondary | ICD-10-CM | POA: Diagnosis present

## 2020-06-06 DIAGNOSIS — D631 Anemia in chronic kidney disease: Secondary | ICD-10-CM | POA: Diagnosis present

## 2020-06-06 DIAGNOSIS — N189 Chronic kidney disease, unspecified: Secondary | ICD-10-CM | POA: Diagnosis not present

## 2020-06-06 DIAGNOSIS — Z9049 Acquired absence of other specified parts of digestive tract: Secondary | ICD-10-CM | POA: Diagnosis not present

## 2020-06-06 DIAGNOSIS — I5033 Acute on chronic diastolic (congestive) heart failure: Secondary | ICD-10-CM | POA: Diagnosis present

## 2020-06-06 DIAGNOSIS — Z853 Personal history of malignant neoplasm of breast: Secondary | ICD-10-CM | POA: Diagnosis not present

## 2020-06-06 DIAGNOSIS — R4182 Altered mental status, unspecified: Secondary | ICD-10-CM | POA: Diagnosis present

## 2020-06-06 DIAGNOSIS — E1151 Type 2 diabetes mellitus with diabetic peripheral angiopathy without gangrene: Secondary | ICD-10-CM | POA: Diagnosis present

## 2020-06-06 DIAGNOSIS — N179 Acute kidney failure, unspecified: Secondary | ICD-10-CM | POA: Diagnosis present

## 2020-06-06 DIAGNOSIS — Z6841 Body Mass Index (BMI) 40.0 and over, adult: Secondary | ICD-10-CM | POA: Diagnosis not present

## 2020-06-06 DIAGNOSIS — I132 Hypertensive heart and chronic kidney disease with heart failure and with stage 5 chronic kidney disease, or end stage renal disease: Secondary | ICD-10-CM | POA: Diagnosis present

## 2020-06-06 DIAGNOSIS — M1990 Unspecified osteoarthritis, unspecified site: Secondary | ICD-10-CM | POA: Diagnosis present

## 2020-06-06 LAB — CBC
HCT: 27.7 % — ABNORMAL LOW (ref 36.0–46.0)
Hemoglobin: 8.2 g/dL — ABNORMAL LOW (ref 12.0–15.0)
MCH: 28.4 pg (ref 26.0–34.0)
MCHC: 29.6 g/dL — ABNORMAL LOW (ref 30.0–36.0)
MCV: 95.8 fL (ref 80.0–100.0)
Platelets: 423 10*3/uL — ABNORMAL HIGH (ref 150–400)
RBC: 2.89 MIL/uL — ABNORMAL LOW (ref 3.87–5.11)
RDW: 15.9 % — ABNORMAL HIGH (ref 11.5–15.5)
WBC: 7.2 10*3/uL (ref 4.0–10.5)
nRBC: 0 % (ref 0.0–0.2)

## 2020-06-06 LAB — GLUCOSE, CAPILLARY
Glucose-Capillary: 175 mg/dL — ABNORMAL HIGH (ref 70–99)
Glucose-Capillary: 192 mg/dL — ABNORMAL HIGH (ref 70–99)

## 2020-06-06 LAB — RENAL FUNCTION PANEL
Albumin: 3.1 g/dL — ABNORMAL LOW (ref 3.5–5.0)
Anion gap: 10 (ref 5–15)
BUN: 85 mg/dL — ABNORMAL HIGH (ref 8–23)
CO2: 22 mmol/L (ref 22–32)
Calcium: 8.8 mg/dL — ABNORMAL LOW (ref 8.9–10.3)
Chloride: 110 mmol/L (ref 98–111)
Creatinine, Ser: 3.68 mg/dL — ABNORMAL HIGH (ref 0.44–1.00)
GFR calc Af Amer: 13 mL/min — ABNORMAL LOW (ref >60)
GFR calc non Af Amer: 11 mL/min — ABNORMAL LOW (ref >60)
Glucose, Bld: 147 mg/dL — ABNORMAL HIGH (ref 70–99)
Phosphorus: 6.3 mg/dL — ABNORMAL HIGH (ref 2.5–4.6)
Potassium: 4.2 mmol/L (ref 3.5–5.1)
Sodium: 142 mmol/L (ref 135–145)

## 2020-06-06 MED ORDER — INSULIN ASPART 100 UNIT/ML ~~LOC~~ SOLN
0.0000 [IU] | Freq: Three times a day (TID) | SUBCUTANEOUS | Status: DC
  Administered 2020-06-07 (×2): 2 [IU] via SUBCUTANEOUS
  Administered 2020-06-07 – 2020-06-08 (×2): 1 [IU] via SUBCUTANEOUS
  Administered 2020-06-08: 3 [IU] via SUBCUTANEOUS
  Administered 2020-06-08: 1 [IU] via SUBCUTANEOUS
  Administered 2020-06-09 (×2): 2 [IU] via SUBCUTANEOUS

## 2020-06-06 MED ORDER — INSULIN ASPART 100 UNIT/ML ~~LOC~~ SOLN: 0.0000 [IU] | Freq: Every day | SUBCUTANEOUS | Status: DC

## 2020-06-06 NOTE — Progress Notes (Signed)
[facility] Progress Note    Assessment/ Plan:   1. AoCKD 4 vs new CKD 5 - f/b CKA.  Pt just admitted for PNA / sepsis, creat was 3.3- 4.1.  Now here w/ new confusion and creat 4.0, BUN 90's. Could be uremia vs meds, no N/V.  Pt's renal function has been declining significantly over the last 1.5 yrs. She is sig vol overloaded.  The RUL infiltrates present since Aug 2021 on her CXR's looks like pulm edema. Not real symptomatic for infection or pulm edema. She has severe pulm HTN on her recent echo. Continue Lasix 120 IV TID.  Confusion is improving per pt and dtr.  Will follow closely.  No hard indication for RRT at present but if confusion doesn't continue to improve may be time to start.  2. AMS - not really taking any pain meds or other meds that would cause AMS.  Improved.    3. H/o NICM - resolved w/ PPM placement 4. DM2 - on oral agents 5. HTN - longstanding 6. Debility/ morbid obesity 7. H/o PPM  Subjective:    Cr a bit better at 3.6 today.  Confusion is much better per pt and dtr. Diuresing well.      Objective:   BP (!) 141/51 (BP Location: Left Arm)   Pulse 72   Temp 98.2 F (36.8 C)   Resp 18   Ht 5\' 4"  (1.626 m)   Wt 108.7 kg   SpO2 99%   BMI 41.13 kg/m   Intake/Output Summary (Last 24 hours) at 06/06/2020 1402 Last data filed at 06/06/2020 1100 Gross per 24 hour  Intake 358.17 ml  Output 1950 ml  Net -1591.83 ml   Weight change:   Physical Exam: Gen: older woman, sitting up in chair, NAD CVS: RRR Resp: normal WOB, on [place] O2, faint bibasilar crackles Abd: soft, obese Ext: 2+ bilateral LE edema, improving per pt  ACCESS: + L AVF  Imaging: CT HEAD WO CONTRAST  Result Date: 06/05/2020 CLINICAL DATA:  Altered mental status EXAM: CT HEAD WITHOUT CONTRAST TECHNIQUE: Contiguous axial images were obtained from the base of the skull through the vertex without intravenous contrast. COMPARISON:  None. FINDINGS: Brain: There is age related volume loss.  There is no intracranial mass, hemorrhage, extra-axial fluid collection, or midline shift. There is evidence of a prior infarct in the superior left parietal lobe. There is mild periventricular small vessel disease in the centra semiovale bilaterally. No acute appearing infarct is demonstrable on this study. Vascular: There is no hyperdense vessel. There is calcification in each carotid siphon region. Skull: The bony calvarium appears intact. Sinuses/Orbits: There is mild mucosal thickening in several ethmoid air cells. Other visualized paranasal sinuses are clear. Visualized orbits appear symmetric bilaterally. Other: Visualized mastoid air cells are clear. IMPRESSION: 1. Prior left parietal lobe infarct. Mild periventricular small vessel disease. No acute infarct demonstrable on this study. No mass or hemorrhage. 2.  There are foci of arterial vascular calcification. 3.  There is mild mucosal thickening in several ethmoid air cells. Electronically Signed   By: Lowella Grip III M.D.   On: 06/05/2020 08:41   US RENAL  Result Date: 06/06/2020 CLINICAL DATA:  79 year old with stage 5 chronic kidney disease for which the patient had a dialysis AV fistula placed in the RIGHT arm on 05/18/2020. EXAM: RENAL / URINARY TRACT ULTRASOUND COMPLETE COMPARISON:  Report of urinary tract ultrasound at [facility] dated 03/06/2018. FINDINGS: Right Kidney: Renal measurements: Approximately  11.0 x 4.9 x 3.6 cm = volume: 102 mL. No hydronephrosis. Echogenic parenchyma. Diffuse cortical thinning. Benign cysts arising from the upper pole, the largest measuring approximately 1.4 cm. No solid renal masses. No visible shadowing calculi. Left Kidney: Renal measurements: Approximately 10.9 x 5.5 x 4.1 cm = volume: 128 mL. No hydronephrosis. Echogenic parenchyma. Diffuse cortical thinning. No focal parenchymal abnormalities. No visible shadowing calculi. Bladder: Normal for degree of bladder distention. No ureteral jets  visible at Doppler evaluation. Other: None. IMPRESSION: 1. No evidence of hydronephrosis to suggest urinary tract obstruction. 2. Echogenic parenchyma and diffuse cortical thinning involving both kidneys indicating chronic kidney disease. 3. Benign cysts arising from the upper pole of the RIGHT kidney. No solid renal masses. 4. Normal appearing bladder. No ureteral jets were visible at Doppler evaluation. Electronically Signed   By: Evangeline Dakin M.D.   On: 06/06/2020 11:59   DG Chest Port 1 View  Result Date: 06/05/2020 CLINICAL DATA:  Altered mental status EXAM: PORTABLE CHEST 1 VIEW COMPARISON:  May 28, 2020 FINDINGS: There is airspace opacity in each upper lobe region, similar on the right and increased on the left. Lungs otherwise are clear. There is cardiomegaly with pulmonary venous hypertension. Pacemaker leads are attached to the right atrium, right ventricle, and coronary sinus. No adenopathy. No bone lesions. IMPRESSION: Bilateral upper lobe airspace opacity, new on the left and similar on the right. Question atypical organism pneumonia. Stable cardiomegaly with a degree of pulmonary vascular congestion. Stable pacemaker lead placements. No adenopathy evident by radiography. Electronically Signed   By: Lowella Grip III M.D.   On: 06/05/2020 08:38    Labs: BMET Recent Labs  Lab 06/05/20 0852 06/06/20 0900  NA 143 142  K 4.8 4.2  CL 111 110  CO2 21* 22  GLUCOSE 169* 147*  BUN 94* 85*  CREATININE 4.05* 3.68*  CALCIUM 8.6* 8.8*  PHOS  --  6.3*   CBC Recent Labs  Lab 06/05/20 0852 06/06/20 0900  WBC 8.9 7.2  HGB 8.1* 8.2*  HCT 27.6* 27.7*  MCV 96.2 95.8  PLT 458* 423*    Medications:    . carvedilol  6.25 mg Oral BID  . cholecalciferol  2,000 Units Oral Daily  . heparin  5,000 Units Subcutaneous Q8H  . insulin aspart  0-5 Units Subcutaneous QHS  . insulin aspart  0-9 Units Subcutaneous TID WC  . isosorbide mononitrate  30 mg Oral Daily  . pravastatin  10  mg Oral QHS  . vitamin B-12  1,000 mcg Oral Daily      Madelon Lips, MD 06/06/2020, 2:02 PM

## 2020-06-06 NOTE — Progress Notes (Signed)
PROGRESS NOTE  Cassandra Parker  DOB: 11/28/1940  PCP: Manon Hilding, MD URK:[id number]  DOA: 06/05/2020  LOS: 0 days   Chief Complaint  Patient presents with  . Altered Mental Status   Brief narrative: Cassandra Parker is a 79 y.o. female with PMH of DM2, HTN, HLD, CKD 5, complete heart block, nonischemic cardiomyopathy with resolved after pacemaker placement in (8315), diastolic CHF, severe pulmonary hypertension, right breast cancer status post mastectomy, arthritis Patient presented to the ED on 06/05/2020 with confusion and generalized weakness. Recently hospitalized 05/22/2020 - 05/28/2020 due to sepsis from pneumonia complicated with AKI, possible GI bleeding because of which Eliquis was held.  Patient was discharged home on oral antibiotics and 3 L oxygen.  Lasix was resumed at 40 mg daily at the time of discharge.  Since discharge, patient has poor oral intake.  She has been using walker and a cane to ambulate.  Family noted confusion, and generalized weakness, progressively worsening and hence brought to the ED on 9/11.  Labs in the ED showed potassium of 4.8, creatinine 4.05, lactic acid 0.6, WBC 8.9, hemoglobin 8.1 CT scan of the head normal. Chest x-ray showed bilateral upper lobe airspace opacities new on the left and severe on the right raising suspicion of atypical pneumonia.    Patient was admitted to hospitalist service for further evaluation management.  Nephrology consultation was obtained.  Subjective: Patient was seen and examined this morning.  Elderly African-American female.  Lying down in bed.  Not in distress. Daughter at bedside. Chart reviewed. No fever, heart rate in 70s, oxygen saturation maintained on 3 L O2, blood pressure in 140s and 150s Labs pending today  Assessment/Plan: AKI on CKD 5 -Was discharged on creatinine 3.7 recently.  Presented with creatinine of 4.05.  Trend as below -Nephrology consultation obtained. -Per nephrology, patient is  having progressive decline in her renal function for last 1 to 2 years.  Clinically she is volume overloaded.  Chest x-ray suggestive of pulmonary edema.  Recent echo with severe pulmonary hypertension. -Nephrology recommended aggressive diuresis.  Patient is currently on IV Lasix drip which we will continue. -Labs this morning show creatinine down to 3.68. -Continue Lasix drip, intake and output monitoring, daily weight, blood pressure, creatinine and electrolyte monitoring Recent Labs    01/09/20 0933 05/18/20 0827 05/22/20 1115 05/23/20 0735 05/24/20 0622 05/26/20 0558 05/27/20 0550 05/28/20 0846 06/05/20 0852 06/06/20 0900  BUN 64* 54* 73* 75* 68* 65* 63* 63* 94* 85*  CREATININE 2.57* 3.30* 3.56* 3.59* 3.57* 4.15* 3.93* 3.70* 4.05* 1.76*   Acute metabolic encephalopathy -Multifactorial : progressive decline in renal function, recent hospitalization and prolonged recovery -Also has significant risk factors for vascular dementia.   -No evidence of infection at this time -Expect improvement in mental status with renal function improvement  Pulmonary infiltrates -Bilateral upper lobe airspace opacities noted in chest x-ray. -Likely secondary to fluid overload and infection. -WBC count, lactic acid and procalcitonin level normal. -Monitor off antibiotics. Recent Labs  Lab 06/05/20 0852 06/05/20 1307 06/06/20 0900  WBC 8.9  --  7.2  LATICACIDVEN 0.6  --   --   PROCALCITON  --  <0.10  --    Chronic respiratory failure with hypoxia -The patient was discharged home on 3 L nasal cannula -Currently stable on 3 L  Severe pulmonary hypertension -Recent echo from 8/29 showed EF of 60 to 65% with RVSP elevated to 64 -Needs chronic dialysis as tolerated.  Diabetes mellitus type 2 -Glipizide on  hold.  Start on sliding scale insulin with Accu-Cheks. Lab Results  Component Value Date   HGBA1C 7.4 (H) 05/22/2020   No results for input(s): GLUCAP in the last 168  hours.  Essential hypertension -Continue Coreg and Lasix drip. Continue to hold amlodipine, hydralazine.    Complete heart block  -Status post PPM 2016  Anemia of CKD -Baseline hemoglobin 8-9 -Eliquis was stopped on last admission.  -Hemoglobin stable so far Recent Labs    05/22/20 1115 05/22/20 2129 05/23/20 0735 05/24/20 0622 05/25/20 0959 05/26/20 0558 05/27/20 0550 05/28/20 0846 06/05/20 0852 06/05/20 1307 06/06/20 0900  HGB 9.1* 8.0* 8.6* 8.2* 8.4* 7.8* 7.8* 8.5* 8.1*  --  8.2*  VITAMINB12  --   --   --   --   --   --   --   --  2,304*  --   --   FOLATE  --   --   --   --   --   --   --   --  10.8 11.4  --   FERRITIN  --   --   --   --   --   --   --   --  75  --   --   TIBC  --   --   --   --   --   --   --   --  271  --   --   IRON  --   --   --   --   --   --   --   --  35  --   --   RETICCTPCT  --   --   --   --   --   --   --   --  2.3  --   --    Hyperlipidemia -Continue statin  History ofRt Breast cancer -s/p chemo and Rt mastectomy 2008 -Stable, follow-up as outpatient  Mobility: Encourage ambulation.  PT eval Code Status:   Code Status: Full Code  Nutritional status: Body mass index is 41.13 kg/m.     Diet Order            Diet heart healthy/carb modified Room service appropriate? Yes; Fluid consistency: Thin; Fluid restriction: 1500 mL Fluid  Diet effective now                 DVT prophylaxis: heparin injection 5,000 Units Start: 06/05/20 1715   Antimicrobials:  None Fluid: None Consultants: Nephrology Family Communication:  Not at bedside  Status is: Observation  The patient will require care spanning > 2 midnights and should be moved to inpatient because: On Lasix drip.  Needs more diuresis and inpatient monitoring  Dispo: The patient is from: Home              Anticipated d/c is to: PT eval obtained.  Home health PT recommended.              Anticipated d/c date is: > 3 days              Patient currently is not medically  stable to d/c.   Infusions:  . furosemide 120 mg (06/06/20 0339)    Scheduled Meds: . carvedilol  6.25 mg Oral BID  . cholecalciferol  2,000 Units Oral Daily  . heparin  5,000 Units Subcutaneous Q8H  . insulin aspart  0-5 Units Subcutaneous QHS  . insulin aspart  0-9 Units Subcutaneous TID WC  . isosorbide  mononitrate  30 mg Oral Daily  . pravastatin  10 mg Oral QHS  . vitamin B-12  1,000 mcg Oral Daily    Antimicrobials: Anti-infectives (From admission, onward)   Start     Dose/Rate Route Frequency Ordered Stop   06/05/20 1115  cefTRIAXone (ROCEPHIN) 1 g in sodium chloride 0.9 % 100 mL IVPB        1 g 200 mL/hr over 30 Minutes Intravenous  Once 06/05/20 1105 06/05/20 1319   06/05/20 1115  azithromycin (ZITHROMAX) 500 mg in sodium chloride 0.9 % 250 mL IVPB        500 mg 250 mL/hr over 60 Minutes Intravenous  Once 06/05/20 1105 06/05/20 1507      PRN meds: acetaminophen **OR** acetaminophen, ondansetron **OR** ondansetron (ZOFRAN) IV   Objective: Vitals:   06/06/20 0444 06/06/20 0619  BP: (!) 141/50 (!) 141/51  Pulse: 73 72  Resp:  18  Temp:  98.2 F (36.8 C)  SpO2:  99%    Intake/Output Summary (Last 24 hours) at 06/06/2020 1417 Last data filed at 06/06/2020 1100 Gross per 24 hour  Intake 358.17 ml  Output 1950 ml  Net -1591.83 ml   Filed Weights   06/05/20 0735 06/06/20 0500 06/06/20 0619  Weight: 104.3 kg 108.7 kg 108.7 kg   Weight change:  Body mass index is 41.13 kg/m.   Physical Exam: General exam: Appears calm and comfortable.  Not in distress Skin: No rashes, lesions or ulcers. HEENT: Atraumatic, normocephalic, supple neck, no obvious bleeding Lungs: Diminished air entry in both bases.  Otherwise clear to auscultation CVS: Regular rate and rhythm, no murmur GI/Abd soft, nontender, nondistended, bowel sound present CNS: Alert, awake, oriented to place and person Psychiatry: Depressed look Extremities: Pedal edema 1+ bilateral  Data Review: I  have personally reviewed the laboratory data and studies available.  Recent Labs  Lab 06/05/20 0852 06/06/20 0900  WBC 8.9 7.2  HGB 8.1* 8.2*  HCT 27.6* 27.7*  MCV 96.2 95.8  PLT 458* 423*   Recent Labs  Lab 06/05/20 0852 06/06/20 0900  NA 143 142  K 4.8 4.2  CL 111 110  CO2 21* 22  GLUCOSE 169* 147*  BUN 94* 85*  CREATININE 4.05* 3.68*  CALCIUM 8.6* 8.8*  PHOS  --  6.3*    F/u labs ordered.  Signed, Terrilee Croak, MD [facility] 06/06/2020

## 2020-06-06 NOTE — Evaluation (Signed)
Physical Therapy Evaluation Patient Details Name: Cassandra Parker MRN: [id number] DOB: 1941-01-20 Today's Date: 06/06/2020   History of Present Illness  Cassandra Parker is a 79 y.o. female with medical history of diabetes mellitus type 2, CKD stage V, hypertension, hyperlipidemia, complete heart block status post PPM, pulmonary hypertension, right breast cancer status postmastectomy, diastolic CHF, and PE presenting with confusion.  The patient was recently hospitalized from 05/22/2020 through 05/28/2020 secondary to sepsis from pneumonia.  Her hospitalization was complicated by increasing serum creatinine for which her furosemide was held.  It was ultimately restarted at the time of discharge, 40 mg p.o. daily.  Her hemoglobin also trended down during the hospitalization.  GI was consulted, I recommended endoscopy after the patient has been stabilized from a respiratory standpoint.  Her apixaban was ultimately discontinued.  She had received approximately 5 months of apixaban for her PE.  She was discharged once her hemoglobin stabilized and there was no evidence of frank active bleeding.  She was discharged home on 3 L nasal cannula with cefdinir and azithromycin.Since her discharge home, the patient has had decreased oral intake.  She has been using a walker and a cane to ambulate.  However, the patient's family noted her to be increasingly confused with increased generalized weakness in the 24 hours prior to bring her back to the emergency department on 06/05/2020.  The patient is a poor historian, but denies any headache, fevers, chills, chest pain, shortness of breath, nausea, vomiting, diarrhea, abdominal pain, dysuria.  Family states that her edema has not significantly worsened since going home.  However, the family states that the patient has had to sleep in a recliner since returning home because she "feels uncomfortable" laying in bed.In the emergency department, patient was afebrile  hemodynamically stable with oxygen saturation 100% 3 L.  BMP showed a potassium 4.8 and serum creatinine 4.05 and BUN 94.  AST 61, ALT 113, alk phosphatase 79, total bilirubin 0.5.  WBC was 8.9 with hemoglobin 8.1 and platelets 150,000.  Lactic acid was 0.6.  BNP was 1406.  CT of the brain was negative for any acute findings.  Chest x-ray showed bilateral upper space opacities new on the left.  There is pulmonary vascular congestion.    Clinical Impression  Patient functioning near baseline for functional mobility and gait, demonstrates slightly labored cadence during ambulation without loss of balance, limited mostly due to fatigue and mild SOB with SpO2 dropping from 92% to 87% during ambulation, able to recover to 93-94% after instruction in pursed lipped breathing while seated in chair.  Patient tolerated sitting up in chair after therapy with her daughter present in room after therapy - RN notified.  Patient will benefit from continued physical therapy in hospital and recommended venue below to increase strength, balance, endurance for safe ADLs and gait.      Follow Up Recommendations Home health PT;Supervision for mobility/OOB;Supervision - Intermittent    Equipment Recommendations  None recommended by PT    Recommendations for Other Services       Precautions / Restrictions Precautions Precautions: Fall Precaution Comments: 3 LPM Home O2 dependent Restrictions Weight Bearing Restrictions: No      Mobility  Bed Mobility Overal bed mobility: Modified Independent             General bed mobility comments: increased time  Transfers Overall transfer level: Needs assistance   Transfers: Sit to/from Stand;Stand Pivot Transfers Sit to Stand: Supervision Stand pivot transfers: Supervision;Min guard  General transfer comment: increased time, slightly labored movement  Ambulation/Gait Ambulation/Gait assistance: Supervision;Min guard Gait Distance (Feet): 80  Feet Assistive device: Rolling walker (2 wheeled) Gait Pattern/deviations: Decreased step length - right;Decreased step length - left;Decreased stride length Gait velocity: decreased   General Gait Details: slightly labored cadence without loss of balance, on 3 LPM with SpO2 at 87-88%, limited secondary to fatigue  Stairs            Wheelchair Mobility    Modified Rankin (Stroke Patients Only)       Balance Overall balance assessment: Needs assistance Sitting-balance support: Feet supported;No upper extremity supported Sitting balance-Leahy Scale: Good Sitting balance - Comments: seated at EOB   Standing balance support: No upper extremity supported;Bilateral upper extremity supported Standing balance-Leahy Scale: Fair Standing balance comment: using RW                             Pertinent Vitals/Pain Pain Assessment: No/denies pain    Home Living Family/patient expects to be discharged to:: Private residence Living Arrangements: Children Available Help at Discharge: Family;Available 24 hours/day Type of Home: Mobile home Home Access: Ramped entrance     Home Layout: One level Home Equipment: [place] - 4 wheels;Bedside commode      Prior Function Level of Independence: Independent with assistive device(s);Needs assistance   Gait / Transfers Assistance Needed: household ambulator using Rollator  ADL's / Homemaking Assistance Needed: assisted by family        Hand Dominance   Dominant Hand: Right    Extremity/Trunk Assessment   Upper Extremity Assessment Upper Extremity Assessment: Generalized weakness    Lower Extremity Assessment Lower Extremity Assessment: Generalized weakness    Cervical / Trunk Assessment Cervical / Trunk Assessment: Normal  Communication   Communication: No difficulties  Cognition Arousal/Alertness: Awake/alert Behavior During Therapy: WFL for tasks assessed/performed Overall Cognitive Status: Within  Functional Limits for tasks assessed                                 General Comments: slightly confused per patient's daughter      General Comments      Exercises     Assessment/Plan    PT Assessment Patient needs continued PT services  PT Problem List Decreased strength;Decreased activity tolerance;Decreased balance;Decreased mobility       PT Treatment Interventions Gait training;Stair training;Functional mobility training;Therapeutic activities;Therapeutic exercise;Patient/family education    PT Goals (Current goals can be found in the Care Plan section)  Acute Rehab PT Goals Patient Stated Goal: return home with family to assist PT Goal Formulation: With patient/family Time For Goal Achievement: 06/09/20 Potential to Achieve Goals: Good    Frequency Min 3X/week   Barriers to discharge        Co-evaluation               AM-PAC PT "6 Clicks" Mobility  Outcome Measure Help needed turning from your back to your side while in a flat bed without using bedrails?: None Help needed moving from lying on your back to sitting on the side of a flat bed without using bedrails?: None Help needed moving to and from a bed to a chair (including a wheelchair)?: A Little Help needed standing up from a chair using your arms (e.g., wheelchair or bedside chair)?: A Little Help needed to walk in hospital room?: A Little Help needed climbing 3-5 steps  with a railing? : A Lot 6 Click Score: 19    End of Session Equipment Utilized During Treatment: Oxygen Activity Tolerance: Patient tolerated treatment well;Patient limited by fatigue Patient left: in chair;with call bell/phone within reach;with family/visitor present Nurse Communication: Mobility status PT Visit Diagnosis: Unsteadiness on feet (R26.81);Other abnormalities of gait and mobility (R26.89);Muscle weakness (generalized) (M62.81)    Time: 9753-0051 PT Time Calculation (min) (ACUTE ONLY): 32  min   Charges:   PT Evaluation $PT Eval Moderate Complexity: 1 Mod PT Treatments $Therapeutic Activity: 23-37 mins        1:08 PM, 06/06/20 Lonell Grandchild, MPT Physical Therapist with [facility] [phone] office [phone] mobile phone

## 2020-06-06 NOTE — TOC Initial Note (Signed)
Transition of Care [facility]) - Initial/Assessment Note    Patient Details  Name: Cassandra Parker MRN: [id number] Date of Birth: 01-26-1941  Transition of Care [facility]) CM/SW Contact:    Natasha Bence, LCSW Phone Number: 06/06/2020, 3:34 PM  Clinical Narrative:                 CSW received order for HHPT. CSW discussed [place] with patient. Patient agreeable to [facility]. Daughter at bedside. CSW placed referral for HHPT with Georgina Snell from [place]. [facility] agreeable to provide services to patient. TOC to follow.  Expected Discharge Plan: [place] Barriers to Discharge: Continued Medical Work up   Patient Goals and CMS Choice Patient states their goals for this hospitalization and ongoing recovery are:: Return home with [facility]   Choice offered to / list presented to : Patient, Adult Children  Expected Discharge Plan and Services Expected Discharge Plan: [place]       Living arrangements for the past 2 months: Single Family Home                           HH Arranged: PT HH Agency: [place] Date [place]: 06/06/20 Time HH Agency Contacted: 1400 Representative spoke with at [place]: Georgina Snell  Prior Living Arrangements/Services Living arrangements for the past 2 months: [place] with:: Self, Adult Children Patient language and need for interpreter reviewed:: Yes Do you feel safe going back to the place where you live?: Yes      Need for Family Participation in Patient Care: Yes (Comment) Care giver support system in place?: Yes (comment)   Criminal Activity/Legal Involvement Pertinent to Current Situation/Hospitalization: No - Comment as needed  Activities of Daily Living Home Assistive Devices/Equipment: Environmental consultant (specify type), Shower chair with back, CBG Meter ADL Screening (condition at time of admission) Patient's cognitive ability adequate to safely complete daily activities?: Yes Is the patient deaf or have  difficulty hearing?: No Does the patient have difficulty seeing, even when wearing glasses/contacts?: No Does the patient have difficulty concentrating, remembering, or making decisions?: No Patient able to express need for assistance with ADLs?: Yes Does the patient have difficulty dressing or bathing?: No Independently performs ADLs?: Yes (appropriate for developmental age) Does the patient have difficulty walking or climbing stairs?: Yes Weakness of Legs: Right Weakness of Arms/Hands: Right  Permission Sought/Granted      Share Information with NAME: Harris Kistler  Permission granted to share info w AGENCY: [facility]  Permission granted to share info w Relationship: Daughter  Permission granted to share info w Contact Information: [phone]  Emotional Assessment Appearance:: Developmentally appropriate   Affect (typically observed): Accepting, Adaptable, Appropriate Orientation: : Oriented to Self, Oriented to Place, Oriented to  Time, Oriented to Situation Alcohol / Substance Use: Not Applicable Psych Involvement: No (comment)  Admission diagnosis:  Confusion [R41.0] Pulmonary infiltrate [R91.8] Uremia [N19] CKD (chronic kidney disease), stage V (HCC) [N18.5] CRF (chronic renal failure), stage 5 (HCC) [W10.9] Acute metabolic encephalopathy [N23.55] Acute alteration in mental status [R41.82] Patient Active Problem List   Diagnosis Date Noted  . Acute metabolic encephalopathy 73/22/0254  . Acute renal failure superimposed on stage 4 chronic kidney disease ([place]) 06/05/2020  . Chronic respiratory failure with hypoxia ([place]) 06/05/2020  . Pulmonary infiltrate   . Uremia   . Severe Pulmonary HTN-- 05/23/2020  . Respiratory failure with hypoxia ([place]) 05/22/2020  . Rt U  L Pneumonia 05/22/2020  . GI bleed 05/22/2020  . Sepsis due to PNA 05/22/2020  . Pulmonary embolism--- VQ scan with intermediate probability 12/22/2019  . Chest pain 12/21/2019  . Primary osteoarthritis of  left knee 11/04/2015  . Pacemaker 10/23/2014  . Acute renal insufficiency 10/15/2013  . Mitral stenosis- moderate with trivial MR  10/15/2013  . CHB (complete heart block) ([place]) 10/13/2013  . Syncope 10/13/2013  . HTN (hypertension) 10/13/2013  . Diabetes mellitus type 2, controlled, without complications ([place]) 40/37/5436  . Cancer of breast- s/p chemo and Rt mastectomy 2008 10/13/2013  . Cardiomyopathy- EF 35-40% (etiology not yet determined) 10/13/2013  . Dyslipidemia 10/13/2013   PCP:  Manon Hilding, MD Pharmacy:   [id number] - [place], [place] [address] [place] [address] Phone: [phone] Fax: [facility] [place], [place] [address] Phone: [phone] Fax: [phone]     Social Determinants of Health (SDOH) Interventions    Readmission Risk Interventions Readmission Risk Prevention Plan 05/27/2020  Transportation Screening Complete  HRI or Home Care Consult Complete  Social Work Consult for [place] Planning/Counseling Complete  Palliative Care Screening Not Applicable  Medication Review Press photographer) Complete  Some recent data might be hidden

## 2020-06-06 NOTE — Plan of Care (Signed)
  Problem: Acute Rehab PT Goals(only PT should resolve) Goal: Pt Will Go Supine/Side To Sit Outcome: Progressing Flowsheets (Taken 06/06/2020 1310) Pt will go Supine/Side to Sit:  Independently  with modified independence Goal: Patient Will Transfer Sit To/From Stand Outcome: Progressing Flowsheets (Taken 06/06/2020 1310) Patient will transfer sit to/from stand:  with modified independence  with supervision Goal: Pt Will Transfer Bed To Chair/Chair To Bed Outcome: Progressing Flowsheets (Taken 06/06/2020 1310) Pt will Transfer Bed to Chair/Chair to Bed:  with modified independence  with supervision Goal: Pt Will Ambulate Outcome: Progressing Flowsheets (Taken 06/06/2020 1310) Pt will Ambulate:  100 feet  with modified independence  with supervision  with rolling walker   1:10 PM, 06/06/20 Lonell Grandchild, MPT Physical Therapist with [facility] [phone] office [phone] mobile phone

## 2020-06-06 NOTE — Care Management Obs Status (Signed)
[place] NOTIFICATION   Patient Details  Name: Cassandra Parker MRN: [id number] Date of Birth: 09-10-1941   Medicare Observation Status Notification Given:  Yes    Natasha Bence, LCSW 06/06/2020, 3:14 PM

## 2020-06-06 NOTE — Progress Notes (Signed)
Message sent to MD r/t diastolic blood pressure.

## 2020-06-07 LAB — BASIC METABOLIC PANEL
Anion gap: 14 (ref 5–15)
BUN: 81 mg/dL — ABNORMAL HIGH (ref 8–23)
CO2: 24 mmol/L (ref 22–32)
Calcium: 9.1 mg/dL (ref 8.9–10.3)
Chloride: 105 mmol/L (ref 98–111)
Creatinine, Ser: 3.68 mg/dL — ABNORMAL HIGH (ref 0.44–1.00)
GFR calc Af Amer: 13 mL/min — ABNORMAL LOW (ref >60)
GFR calc non Af Amer: 11 mL/min — ABNORMAL LOW (ref >60)
Glucose, Bld: 184 mg/dL — ABNORMAL HIGH (ref 70–99)
Potassium: 3.8 mmol/L (ref 3.5–5.1)
Sodium: 143 mmol/L (ref 135–145)

## 2020-06-07 LAB — CBC WITH DIFFERENTIAL/PLATELET
Abs Immature Granulocytes: 0.04 10*3/uL (ref 0.00–0.07)
Basophils Absolute: 0.1 10*3/uL (ref 0.0–0.1)
Basophils Relative: 1 %
Eosinophils Absolute: 0.2 10*3/uL (ref 0.0–0.5)
Eosinophils Relative: 2 %
HCT: 28.7 % — ABNORMAL LOW (ref 36.0–46.0)
Hemoglobin: 8.6 g/dL — ABNORMAL LOW (ref 12.0–15.0)
Immature Granulocytes: 1 %
Lymphocytes Relative: 18 %
Lymphs Abs: 1.3 10*3/uL (ref 0.7–4.0)
MCH: 28.8 pg (ref 26.0–34.0)
MCHC: 30 g/dL (ref 30.0–36.0)
MCV: 96 fL (ref 80.0–100.0)
Monocytes Absolute: 0.8 10*3/uL (ref 0.1–1.0)
Monocytes Relative: 11 %
Neutro Abs: 5 10*3/uL (ref 1.7–7.7)
Neutrophils Relative %: 67 %
Platelets: 423 10*3/uL — ABNORMAL HIGH (ref 150–400)
RBC: 2.99 MIL/uL — ABNORMAL LOW (ref 3.87–5.11)
RDW: 15.8 % — ABNORMAL HIGH (ref 11.5–15.5)
WBC: 7.5 10*3/uL (ref 4.0–10.5)
nRBC: 0 % (ref 0.0–0.2)

## 2020-06-07 LAB — RENAL FUNCTION PANEL
Albumin: 3.1 g/dL — ABNORMAL LOW (ref 3.5–5.0)
Anion gap: 13 (ref 5–15)
BUN: 82 mg/dL — ABNORMAL HIGH (ref 8–23)
CO2: 24 mmol/L (ref 22–32)
Calcium: 9 mg/dL (ref 8.9–10.3)
Chloride: 105 mmol/L (ref 98–111)
Creatinine, Ser: 3.69 mg/dL — ABNORMAL HIGH (ref 0.44–1.00)
GFR calc Af Amer: 13 mL/min — ABNORMAL LOW (ref >60)
GFR calc non Af Amer: 11 mL/min — ABNORMAL LOW (ref >60)
Glucose, Bld: 182 mg/dL — ABNORMAL HIGH (ref 70–99)
Phosphorus: 5 mg/dL — ABNORMAL HIGH (ref 2.5–4.6)
Potassium: 3.8 mmol/L (ref 3.5–5.1)
Sodium: 142 mmol/L (ref 135–145)

## 2020-06-07 LAB — GLUCOSE, CAPILLARY
Glucose-Capillary: 154 mg/dL — ABNORMAL HIGH (ref 70–99)
Glucose-Capillary: 164 mg/dL — ABNORMAL HIGH (ref 70–99)
Glucose-Capillary: 127 mg/dL — ABNORMAL HIGH (ref 70–99)
Glucose-Capillary: 178 mg/dL — ABNORMAL HIGH (ref 70–99)

## 2020-06-07 LAB — MAGNESIUM: Magnesium: 1.8 mg/dL (ref 1.7–2.4)

## 2020-06-07 LAB — PHOSPHORUS: Phosphorus: 4.9 mg/dL — ABNORMAL HIGH (ref 2.5–4.6)

## 2020-06-07 NOTE — Progress Notes (Addendum)
PROGRESS NOTE  [name]  DOB: Jun 21, 1941  PCP: Manon Hilding, MD HLK:[id number]  DOA: 06/05/2020  LOS: 1 day   Chief Complaint  Patient presents with  . Altered Mental Status   Brief narrative: Cassandra Parker is a 79 y.o. female with PMH of DM2, HTN, HLD, CKD 5, complete heart block, nonischemic cardiomyopathy with resolved after pacemaker placement in ([id number]), diastolic CHF, severe pulmonary hypertension, right breast cancer status post mastectomy, arthritis Patient presented to the ED on 06/05/2020 with confusion and generalized weakness. Recently hospitalized 05/22/2020 - 05/28/2020 due to sepsis from pneumonia complicated with AKI, possible GI bleeding because of which Eliquis was held.  Patient was discharged home on oral antibiotics and 3 L oxygen.  Lasix was resumed at 40 mg daily at the time of discharge.  Since discharge, patient has poor oral intake.  She has been using walker and a cane to ambulate.  Family noted confusion, and generalized weakness, progressively worsening and hence brought to the ED on 9/11.  Labs in the ED showed potassium of 4.8, creatinine 4.05, lactic acid 0.6, WBC 8.9, hemoglobin 8.1 CT scan of the head normal. Chest x-ray showed bilateral upper lobe airspace opacities new on the left and severe on the right raising suspicion of atypical pneumonia.    Patient was admitted to hospitalist service for further evaluation management.  Nephrology consultation was obtained.  Subjective: Patient was seen and examined this morning.   Sitting up in chair.  Not in distress.  Feels better.  Remains on Lasix drip.  Daughter at bedside.  Creatinine improving.  Seen by PT.  Home with PT recommended.   Assessment/Plan: AKI on CKD 5 Acute exacerbation of chronic diastolic CHF -Was discharged on creatinine 3.7 recently.  Presented with creatinine of 4.05.  Trend as below -Nephrology consultation obtained. -Per nephrology, patient is having progressive  decline in her renal function for last 1 to 2 years.  Clinically she is volume overloaded.  Chest x-ray suggestive of pulmonary edema.  -Echocardiogram from 8/29 showed LVEF of 60 to 87%, grade 1 diastolic dysfunction elevated RVSP to 64. -Nephrology recommended aggressive diuresis.  Patient is currently on IV Lasix drip which we will continue. -Labs this morning show creatinine same as yesterday at 3.68.  Continue to monitor. -Continue Lasix drip, intake and output monitoring, daily weight, blood pressure, creatinine and electrolyte monitoring. -Per nephrology, anticipate changing to oral Lasix tomorrow. Recent Labs    05/18/20 0827 05/22/20 1115 05/23/20 0735 05/24/20 0622 05/26/20 0558 05/27/20 0550 05/28/20 0846 06/05/20 0852 06/06/20 0900 06/07/20 1019  BUN 54* 73* 75* 68* 65* 63* 63* 94* 85* 82*  81*  CREATININE 3.30* 3.56* 3.59* 3.57* 4.15* 3.93* 3.70* 4.05* 3.68* 3.69*  6.81*   Acute metabolic encephalopathy -Multifactorial : progressive decline in renal function, recent hospitalization and prolonged recovery -Also has significant risk factors for vascular dementia.   -No evidence of infection at this time -Mental status back to baseline per family.  Pulmonary infiltrates -Bilateral upper lobe airspace opacities noted in chest x-ray. -Likely secondary to fluid overload and infection. -WBC count, lactic acid and procalcitonin level normal. -Monitor off antibiotics. Recent Labs  Lab 06/05/20 0852 06/05/20 1307 06/06/20 0900 06/07/20 1019  WBC 8.9  --  7.2 7.5  LATICACIDVEN 0.6  --   --   --   PROCALCITON  --  <0.10  --   --    Chronic respiratory failure with hypoxia -The patient was discharged home on 3 L nasal  cannula -Currently stable on 3 L  Severe pulmonary hypertension -Recent echo from 8/29 showed EF of 60 to 65% with RVSP elevated to 64 -Needs chronic dialysis as tolerated.  Diabetes mellitus type 2 -Glipizide on hold.  Start on sliding scale  insulin with Accu-Cheks. Lab Results  Component Value Date   HGBA1C 7.4 (H) 05/22/2020   Recent Labs  Lab 06/06/20 1652 06/06/20 2117 06/07/20 0810 06/07/20 1127  GLUCAP 175* 192* 154* 164*    Essential hypertension -Continue Coreg and Lasix drip. Continue to hold amlodipine, hydralazine.    Complete heart block  -Status post PPM 2016  Anemia of CKD -Baseline hemoglobin 8-9 -Eliquis was stopped on last admission.  -Hemoglobin stable so far Recent Labs    05/22/20 2129 05/23/20 0735 05/24/20 0622 05/25/20 0959 05/26/20 0558 05/27/20 0550 05/28/20 0846 06/05/20 0852 06/05/20 1307 06/06/20 0900 06/07/20 1019  HGB 8.0* 8.6* 8.2* 8.4* 7.8* 7.8* 8.5* 8.1*  --  8.2* 8.6*  VITAMINB12  --   --   --   --   --   --   --  2,304*  --   --   --   FOLATE  --   --   --   --   --   --   --  10.8 11.4  --   --   FERRITIN  --   --   --   --   --   --   --  75  --   --   --   TIBC  --   --   --   --   --   --   --  271  --   --   --   IRON  --   --   --   --   --   --   --  35  --   --   --   RETICCTPCT  --   --   --   --   --   --   --  2.3  --   --   --    Hyperlipidemia -Continue statin  History ofRt Breast cancer -s/p chemo and Rt mastectomy 2008 -Stable, follow-up as outpatient  Mobility: Encourage ambulation.  PT eval Code Status:   Code Status: Full Code  Nutritional status: Body mass index is 40.83 kg/m.     Diet Order            Diet heart healthy/carb modified Room service appropriate? Yes; Fluid consistency: Thin; Fluid restriction: 1500 mL Fluid  Diet effective now                 DVT prophylaxis: heparin injection 5,000 Units Start: 06/05/20 1715   Antimicrobials:  None Fluid: None Consultants: Nephrology Family Communication:  Not at bedside  Status is: Observation  The patient will require care spanning > 2 midnights and should be moved to inpatient because: On Lasix drip.  Needs more diuresis and inpatient monitoring  Dispo: The patient  is from: Home              Anticipated d/c is to: PT eval obtained.  Home health PT recommended.              Anticipated d/c date is: 1 to 2 days              Patient currently is not medically stable to d/c.   Infusions:  . furosemide 120 mg (06/07/20 1447)  Scheduled Meds: . carvedilol  6.25 mg Oral BID  . cholecalciferol  2,000 Units Oral Daily  . heparin  5,000 Units Subcutaneous Q8H  . insulin aspart  0-5 Units Subcutaneous QHS  . insulin aspart  0-9 Units Subcutaneous TID WC  . isosorbide mononitrate  30 mg Oral Daily  . pravastatin  10 mg Oral QHS  . vitamin B-12  1,000 mcg Oral Daily    Antimicrobials: Anti-infectives (From admission, onward)   Start     Dose/Rate Route Frequency Ordered Stop   06/05/20 1115  cefTRIAXone (ROCEPHIN) 1 g in sodium chloride 0.9 % 100 mL IVPB        1 g 200 mL/hr over 30 Minutes Intravenous  Once 06/05/20 1105 06/05/20 1319   06/05/20 1115  azithromycin (ZITHROMAX) 500 mg in sodium chloride 0.9 % 250 mL IVPB        500 mg 250 mL/hr over 60 Minutes Intravenous  Once 06/05/20 1105 06/05/20 1507      PRN meds: acetaminophen **OR** acetaminophen, ondansetron **OR** ondansetron (ZOFRAN) IV   Objective: Vitals:   06/07/20 0526 06/07/20 1500  BP: (!) 141/52 (!) 140/55  Pulse: 76 69  Resp: 18 19  Temp: 98.6 F (37 C) 98.4 F (36.9 C)  SpO2: 94% 96%    Intake/Output Summary (Last 24 hours) at 06/07/2020 1608 Last data filed at 06/07/2020 1300 Gross per 24 hour  Intake 480 ml  Output 3050 ml  Net -2570 ml   Filed Weights   06/06/20 0500 06/06/20 0619 06/07/20 0500  Weight: 108.7 kg 108.7 kg 107.9 kg   Weight change: 3.573 kg Body mass index is 40.83 kg/m.   Physical Exam: General exam: Appears calm and comfortable.  Not in physical distress Skin: No rashes, lesions or ulcers. HEENT: Atraumatic, normocephalic, supple neck, no obvious bleeding Lungs: Diminished air entry in both bases.  Otherwise clear to auscultation CVS:  Regular rate and rhythm, no murmur GI/Abd soft, nontender, nondistended, bowel sound present CNS: Alert, awake, oriented to place and person Psychiatry: Depressed look Extremities: Pedal edema 1+ bilateral, improving  Data Review: I have personally reviewed the laboratory data and studies available.  Recent Labs  Lab 06/05/20 0852 06/06/20 0900 06/07/20 1019  WBC 8.9 7.2 7.5  NEUTROABS  --   --  5.0  HGB 8.1* 8.2* 8.6*  HCT 27.6* 27.7* 28.7*  MCV 96.2 95.8 96.0  PLT 458* 423* 423*   Recent Labs  Lab 06/05/20 0852 06/06/20 0900 06/07/20 1019  NA 143 142 142  143  K 4.8 4.2 3.8  3.8  CL 111 110 105  105  CO2 21* 22 24  24   GLUCOSE 169* 147* 182*  184*  BUN 94* 85* 82*  81*  CREATININE 4.05* 3.68* 3.69*  3.68*  CALCIUM 8.6* 8.8* 9.0  9.1  MG  --   --  1.8  PHOS  --  6.3* 5.0*  4.9*    F/u labs ordered.  Signed, Terrilee Croak, MD [facility] 06/07/2020

## 2020-06-07 NOTE — Progress Notes (Signed)
Physical Therapy Treatment Patient Details Name: Cassandra Parker MRN: [id number] DOB: 20-Mar-1941 Today's Date: 06/07/2020    History of Present Illness Cassandra Parker is a 79 y.o. female with medical history of diabetes mellitus type 2, CKD stage V, hypertension, hyperlipidemia, complete heart block status post PPM, pulmonary hypertension, right breast cancer status postmastectomy, diastolic CHF, and PE presenting with confusion.  The patient was recently hospitalized from 05/22/2020 through 05/28/2020 secondary to sepsis from pneumonia.  Her hospitalization was complicated by increasing serum creatinine for which her furosemide was held.  It was ultimately restarted at the time of discharge, 40 mg p.o. daily.  Her hemoglobin also trended down during the hospitalization.  GI was consulted, I recommended endoscopy after the patient has been stabilized from a respiratory standpoint.  Her apixaban was ultimately discontinued.  She had received approximately 5 months of apixaban for her PE.  She was discharged once her hemoglobin stabilized and there was no evidence of frank active bleeding.  She was discharged home on 3 L nasal cannula with cefdinir and azithromycin.Since her discharge home, the patient has had decreased oral intake.  She has been using a walker and a cane to ambulate.  However, the patient's family noted her to be increasingly confused with increased generalized weakness in the 24 hours prior to bring her back to the emergency department on 06/05/2020.  The patient is a poor historian, but denies any headache, fevers, chills, chest pain, shortness of breath, nausea, vomiting, diarrhea, abdominal pain, dysuria.  Family states that her edema has not significantly worsened since going home.  However, the family states that the patient has had to sleep in a recliner since returning home because she "feels uncomfortable" laying in bed.In the emergency department, patient was afebrile  hemodynamically stable with oxygen saturation 100% 3 L.  BMP showed a potassium 4.8 and serum creatinine 4.05 and BUN 94.  AST 61, ALT 113, alk phosphatase 79, total bilirubin 0.5.  WBC was 8.9 with hemoglobin 8.1 and platelets 150,000.  Lactic acid was 0.6.  BNP was 1406.  CT of the brain was negative for any acute findings.  Chest x-ray showed bilateral upper space opacities new on the left.  There is pulmonary vascular congestion.    PT Comments    Patient tolerated treatment well. Daughter reports patient's cognition is improving. Patient exhibited improved endurance for activity and ambulation with RW. Patient performed therapeutic exercises with evidence of fatigue and shortness of breathe with activity. Patient did require short rest breaks prior to continued activity.   Follow Up Recommendations  Home health PT;Supervision for mobility/OOB;Supervision - Intermittent     Equipment Recommendations  None recommended by PT    Recommendations for Other Services       Precautions / Restrictions Precautions Precautions: Fall Precaution Comments: 3 LPM Home O2 dependent    Mobility  Bed Mobility Overal bed mobility: Modified Independent             General bed mobility comments: increased time  Transfers Overall transfer level: Needs assistance   Transfers: Sit to/from Stand;Stand Pivot Transfers Sit to Stand: Supervision Stand pivot transfers: Supervision;Min guard       General transfer comment: increased time, slightly labored movement  Ambulation/Gait Ambulation/Gait assistance: Supervision;Min guard   Assistive device: Rolling walker (2 wheeled) Gait Pattern/deviations: Decreased step length - right;Decreased step length - left;Decreased stride length Gait velocity: decreased   General Gait Details: slightly labored cadence without loss of balance, limited secondary to fatigue; #  LPM during ambulation with RW   Stairs             Wheelchair  Mobility    Modified Rankin (Stroke Patients Only)       Balance Overall balance assessment: Needs assistance Sitting-balance support: Feet supported;No upper extremity supported Sitting balance-Leahy Scale: Good Sitting balance - Comments: seated at EOB   Standing balance support: No upper extremity supported;Bilateral upper extremity supported Standing balance-Leahy Scale: Fair Standing balance comment: using RW          Cognition Arousal/Alertness: Awake/alert Behavior During Therapy: WFL for tasks assessed/performed Overall Cognitive Status: Within Functional Limits for tasks assessed  General Comments: slightly confused per patient's daughter      Exercises General Exercises - Upper Extremity Shoulder Flexion: AROM;Strengthening;Both;10 reps;Seated General Exercises - Lower Extremity Long Arc Quad: AROM;Strengthening;Both;10 reps;Seated Hip Flexion/Marching: AROM;Strengthening;Both;10 reps;Seated Toe Raises: AROM;Strengthening;Both;10 reps;Seated Heel Raises: AROM;Strengthening;Both;10 reps;Seated    General Comments        Pertinent Vitals/Pain Pain Assessment: No/denies pain    Home Living       Prior Function            PT Goals (current goals can now be found in the care plan section) Acute Rehab PT Goals Patient Stated Goal: return home with family to assist PT Goal Formulation: With patient/family Time For Goal Achievement: 06/09/20 Potential to Achieve Goals: Good Progress towards PT goals: Progressing toward goals    Frequency    Min 3X/week      PT Plan Current plan remains appropriate       AM-PAC PT "6 Clicks" Mobility   Outcome Measure  Help needed turning from your back to your side while in a flat bed without using bedrails?: None Help needed moving from lying on your back to sitting on the side of a flat bed without using bedrails?: None Help needed moving to and from a bed to a chair (including a wheelchair)?: A  Little Help needed standing up from a chair using your arms (e.g., wheelchair or bedside chair)?: A Little Help needed to walk in hospital room?: A Little Help needed climbing 3-5 steps with a railing? : A Lot 6 Click Score: 19    End of Session Equipment Utilized During Treatment: Oxygen Activity Tolerance: Patient tolerated treatment well;Patient limited by fatigue Patient left: in chair;with call bell/phone within reach;with family/visitor present Nurse Communication: Mobility status PT Visit Diagnosis: Unsteadiness on feet (R26.81);Other abnormalities of gait and mobility (R26.89);Muscle weakness (generalized) (M62.81)     Time: 5701-7793 PT Time Calculation (min) (ACUTE ONLY): 28 min  Charges:  $Gait Training: 8-22 mins $Therapeutic Exercise: 8-22 mins                     Floria Raveling. Hartnett-Rands, MS, PT Per [place] #[id number] 06/07/2020, 2:40 PM

## 2020-06-07 NOTE — Telephone Encounter (Signed)
Pt had several labs drawn today. CBC was included in this. Labs are in epic.

## 2020-06-07 NOTE — Telephone Encounter (Signed)
Lmom for pts daughter. Waiting on a return call.  

## 2020-06-07 NOTE — Progress Notes (Signed)
[facility] Progress Note    Assessment/ Plan:   1. AoCKD 4 vs new CKD 5 - f/b CKA at our [place] office.  Creatinine was 2.9 on 7/26.  Recently patient received AV fistula with VVS.  Encephalopathy is improving and patient is diuretic responsive; no current indication to initiate HD.  Would continue furosemide for another 24 hours and then anticipate transition to oral regimen, was on furosemide 40 mg daily prior to presentation, will need to be increased. 2. AMS - improving 3. H/o NICM - resolved w/ PPM placement  4. DM2 - on oral agents 5. HTN - longstanding 6. Debility/ morbid obesity 7. H/o PPM  Subjective:     No AML   Great UOP > 3L on Furosemide 120 IV TID  In Chair finishing off breakfast  dauther in room, no c/o  Confusion better    Objective:   BP (!) 141/52 (BP Location: Left Wrist)   Pulse 76   Temp 98.6 F (37 C)   Resp 18   Ht 5\' 4"  (1.626 m)   Wt 107.9 kg   SpO2 94%   BMI 40.83 kg/m   Intake/Output Summary (Last 24 hours) at 06/07/2020 0953 Last data filed at 06/07/2020 0600 Gross per 24 hour  Intake --  Output 3150 ml  Net -3150 ml   Weight change: 3.573 kg  Physical Exam: Gen: older woman, sitting up in chair, NAD CVS: RRR Resp: normal WOB, on [place] O2, faint bibasilar crackles Abd: soft, obese Ext: 2+ bilateral LE edema ACCESS: + L AVF  Imaging: US RENAL  Result Date: 06/06/2020 CLINICAL DATA:  79 year old with stage 5 chronic kidney disease for which the patient had a dialysis AV fistula placed in the RIGHT arm on 05/18/2020. EXAM: RENAL / URINARY TRACT ULTRASOUND COMPLETE COMPARISON:  Report of urinary tract ultrasound at [facility] dated 03/06/2018. FINDINGS: Right Kidney: Renal measurements: Approximately 11.0 x 4.9 x 3.6 cm = volume: 102 mL. No hydronephrosis. Echogenic parenchyma. Diffuse cortical thinning. Benign cysts arising from the upper pole, the largest measuring approximately 1.4 cm. No solid renal  masses. No visible shadowing calculi. Left Kidney: Renal measurements: Approximately 10.9 x 5.5 x 4.1 cm = volume: 128 mL. No hydronephrosis. Echogenic parenchyma. Diffuse cortical thinning. No focal parenchymal abnormalities. No visible shadowing calculi. Bladder: Normal for degree of bladder distention. No ureteral jets visible at Doppler evaluation. Other: None. IMPRESSION: 1. No evidence of hydronephrosis to suggest urinary tract obstruction. 2. Echogenic parenchyma and diffuse cortical thinning involving both kidneys indicating chronic kidney disease. 3. Benign cysts arising from the upper pole of the RIGHT kidney. No solid renal masses. 4. Normal appearing bladder. No ureteral jets were visible at Doppler evaluation. Electronically Signed   By: Evangeline Dakin M.D.   On: 06/06/2020 11:59    Labs: BMET Recent Labs  Lab 06/05/20 0852 06/06/20 0900  NA 143 142  K 4.8 4.2  CL 111 110  CO2 21* 22  GLUCOSE 169* 147*  BUN 94* 85*  CREATININE 4.05* 3.68*  CALCIUM 8.6* 8.8*  PHOS  --  6.3*   CBC Recent Labs  Lab 06/05/20 0852 06/06/20 0900  WBC 8.9 7.2  HGB 8.1* 8.2*  HCT 27.6* 27.7*  MCV 96.2 95.8  PLT 458* 423*    Medications:    . carvedilol  6.25 mg Oral BID  . cholecalciferol  2,000 Units Oral Daily  . heparin  5,000 Units Subcutaneous Q8H  . insulin aspart  0-5 Units Subcutaneous  QHS  . insulin aspart  0-9 Units Subcutaneous TID WC  . isosorbide mononitrate  30 mg Oral Daily  . pravastatin  10 mg Oral QHS  . vitamin B-12  1,000 mcg Oral Daily      Rexene Agent , MD 06/07/2020, 9:53 AM

## 2020-06-08 ENCOUNTER — Other Ambulatory Visit: Payer: Self-pay

## 2020-06-08 DIAGNOSIS — N179 Acute kidney failure, unspecified: Principal | ICD-10-CM

## 2020-06-08 DIAGNOSIS — N184 Chronic kidney disease, stage 4 (severe): Secondary | ICD-10-CM

## 2020-06-08 LAB — CBC WITH DIFFERENTIAL/PLATELET
Abs Immature Granulocytes: 0.02 10*3/uL (ref 0.00–0.07)
Basophils Absolute: 0.1 10*3/uL (ref 0.0–0.1)
Basophils Relative: 1 %
Eosinophils Absolute: 0.2 10*3/uL (ref 0.0–0.5)
Eosinophils Relative: 3 %
HCT: 26.7 % — ABNORMAL LOW (ref 36.0–46.0)
Hemoglobin: 8 g/dL — ABNORMAL LOW (ref 12.0–15.0)
Immature Granulocytes: 0 %
Lymphocytes Relative: 26 %
Lymphs Abs: 2 10*3/uL (ref 0.7–4.0)
MCH: 28.4 pg (ref 26.0–34.0)
MCHC: 30 g/dL (ref 30.0–36.0)
MCV: 94.7 fL (ref 80.0–100.0)
Monocytes Absolute: 1.1 10*3/uL — ABNORMAL HIGH (ref 0.1–1.0)
Monocytes Relative: 14 %
Neutro Abs: 4.3 10*3/uL (ref 1.7–7.7)
Neutrophils Relative %: 56 %
Platelets: 385 10*3/uL (ref 150–400)
RBC: 2.82 MIL/uL — ABNORMAL LOW (ref 3.87–5.11)
RDW: 15.3 % (ref 11.5–15.5)
WBC: 7.6 10*3/uL (ref 4.0–10.5)
nRBC: 0 % (ref 0.0–0.2)

## 2020-06-08 LAB — GLUCOSE, CAPILLARY
Glucose-Capillary: 148 mg/dL — ABNORMAL HIGH (ref 70–99)
Glucose-Capillary: 228 mg/dL — ABNORMAL HIGH (ref 70–99)
Glucose-Capillary: 137 mg/dL — ABNORMAL HIGH (ref 70–99)
Glucose-Capillary: 153 mg/dL — ABNORMAL HIGH (ref 70–99)

## 2020-06-08 LAB — RENAL FUNCTION PANEL
Albumin: 2.9 g/dL — ABNORMAL LOW (ref 3.5–5.0)
Anion gap: 11 (ref 5–15)
BUN: 84 mg/dL — ABNORMAL HIGH (ref 8–23)
CO2: 27 mmol/L (ref 22–32)
Calcium: 8.8 mg/dL — ABNORMAL LOW (ref 8.9–10.3)
Chloride: 104 mmol/L (ref 98–111)
Creatinine, Ser: 3.72 mg/dL — ABNORMAL HIGH (ref 0.44–1.00)
GFR calc Af Amer: 13 mL/min — ABNORMAL LOW (ref >60)
GFR calc non Af Amer: 11 mL/min — ABNORMAL LOW (ref >60)
Glucose, Bld: 165 mg/dL — ABNORMAL HIGH (ref 70–99)
Phosphorus: 5 mg/dL — ABNORMAL HIGH (ref 2.5–4.6)
Potassium: 3.6 mmol/L (ref 3.5–5.1)
Sodium: 142 mmol/L (ref 135–145)

## 2020-06-08 LAB — MAGNESIUM: Magnesium: 1.8 mg/dL (ref 1.7–2.4)

## 2020-06-08 LAB — BASIC METABOLIC PANEL
Anion gap: 11 (ref 5–15)
BUN: 83 mg/dL — ABNORMAL HIGH (ref 8–23)
CO2: 27 mmol/L (ref 22–32)
Calcium: 8.8 mg/dL — ABNORMAL LOW (ref 8.9–10.3)
Chloride: 105 mmol/L (ref 98–111)
Creatinine, Ser: 3.62 mg/dL — ABNORMAL HIGH (ref 0.44–1.00)
GFR calc Af Amer: 13 mL/min — ABNORMAL LOW (ref >60)
GFR calc non Af Amer: 12 mL/min — ABNORMAL LOW (ref >60)
Glucose, Bld: 166 mg/dL — ABNORMAL HIGH (ref 70–99)
Potassium: 3.6 mmol/L (ref 3.5–5.1)
Sodium: 143 mmol/L (ref 135–145)

## 2020-06-08 MED ORDER — FUROSEMIDE 80 MG PO TABS
120.0000 mg | ORAL_TABLET | Freq: Two times a day (BID) | ORAL | Status: DC
  Administered 2020-06-08 – 2020-06-09 (×3): 120 mg via ORAL
  Filled 2020-06-08 (×3): qty 1

## 2020-06-08 NOTE — Progress Notes (Addendum)
Physical Therapy Treatment Patient Details Name: Cassandra Parker MRN: [id number] DOB: May 05, 1941 Today's Date: 06/08/2020    History of Present Illness Cassandra Parker is a 79 y.o. female with medical history of diabetes mellitus type 2, CKD stage V, hypertension, hyperlipidemia, complete heart block status post PPM, pulmonary hypertension, right breast cancer status postmastectomy, diastolic CHF, and PE presenting with confusion.  The patient was recently hospitalized from 05/22/2020 through 05/28/2020 secondary to sepsis from pneumonia.  Her hospitalization was complicated by increasing serum creatinine for which her furosemide was held.  It was ultimately restarted at the time of discharge, 40 mg p.o. daily.  Her hemoglobin also trended down during the hospitalization.  GI was consulted, I recommended endoscopy after the patient has been stabilized from a respiratory standpoint.  Her apixaban was ultimately discontinued.  She had received approximately 5 months of apixaban for her PE.  She was discharged once her hemoglobin stabilized and there was no evidence of frank active bleeding.  She was discharged home on 3 L nasal cannula with cefdinir and azithromycin.Since her discharge home, the patient has had decreased oral intake.  She has been using a walker and a cane to ambulate.  However, the patient's family noted her to be increasingly confused with increased generalized weakness in the 24 hours prior to bring her back to the emergency department on 06/05/2020.  The patient is a poor historian, but denies any headache, fevers, chills, chest pain, shortness of breath, nausea, vomiting, diarrhea, abdominal pain, dysuria.  Family states that her edema has not significantly worsened since going home.  However, the family states that the patient has had to sleep in a recliner since returning home because she "feels uncomfortable" laying in bed.In the emergency department, patient was afebrile  hemodynamically stable with oxygen saturation 100% 3 L.  BMP showed a potassium 4.8 and serum creatinine 4.05 and BUN 94.  AST 61, ALT 113, alk phosphatase 79, total bilirubin 0.5.  WBC was 8.9 with hemoglobin 8.1 and platelets 150,000.  Lactic acid was 0.6.  BNP was 1406.  CT of the brain was negative for any acute findings.  Chest x-ray showed bilateral upper space opacities new on the left.  There is pulmonary vascular congestion.    PT Comments    Patient was modified independent with supine to sit transfer today. Her O2 sat on 2.5L was 94% at rest at the beginning of the session. The patient demonstrated sit to stand transfer min guard with mild increase in breathing rate and ambulated to the bathroom. The patient tolerated standing balance for 10 minutes while changing gown and cleaning up patient in bathroom, but with O2 dropping to 86% on 3L. O2 increased to 91% seated and rest, and remained at 91% for the duration of seated therapeutic exercises. She was able to complete all therapeutic exercises without overwhelming fatigue or LOB. She ambulated RW min guard for 70 feet, limited by fatigue from prior bathroom mobility. During ambulation the patient's O2 dropped to 87% on 3LPM, but was up to 94% once rested for 2 minutes seated at EOS. Patient tolerated sitting up in chair with daughter present at bedside after therapy with nurse present in room. PLAN: The patient will continue to benefit from skilled physical therapy services in hospital at recommended venue below in order to improve balance, gait, and ADL's to promote independence in functional activities.      Follow Up Recommendations  Home health PT;Supervision for mobility/OOB;Supervision - Intermittent  Equipment Recommendations  None recommended by PT    Recommendations for Other Services       Precautions / Restrictions Precautions Precautions: Fall Precaution Comments: 3 LPM Home O2 dependent Restrictions Weight Bearing  Restrictions: No    Mobility  Bed Mobility Overal bed mobility: Modified Independent             General bed mobility comments: increased time  Transfers Overall transfer level: Needs assistance Equipment used: Rolling walker (2 wheeled) Transfers: Sit to/from Stand Sit to Stand: Min guard         General transfer comment: increased time, slightly labored movement  Ambulation/Gait Ambulation/Gait assistance: Supervision;Min guard Gait Distance (Feet): 70 Feet Assistive device: Rolling walker (2 wheeled) Gait Pattern/deviations: Decreased step length - right;Decreased step length - left;Decreased stride length;Step-through pattern;Trunk flexed Gait velocity: decreased   General Gait Details: slightly labored cadence without loss of balance, limited secondary to fatigue; trunk flexed in RW with slight increase in breathing rate; O2 dropped to 87% on 3LPM   Stairs             Wheelchair Mobility    Modified Rankin (Stroke Patients Only)       Balance Overall balance assessment: Needs assistance Sitting-balance support: Feet supported;No upper extremity supported Sitting balance-Leahy Scale: Good Sitting balance - Comments: seated at EOB   Standing balance support: Bilateral upper extremity supported;During functional activity Standing balance-Leahy Scale: Fair Standing balance comment: reliance on RW for support due to fatigue                            Cognition Arousal/Alertness: Awake/alert Behavior During Therapy: WFL for tasks assessed/performed Overall Cognitive Status: Within Functional Limits for tasks assessed                                        Exercises General Exercises - Lower Extremity Long Arc Quad: AROM;Strengthening;Both;10 reps;Seated Hip Flexion/Marching: AROM;Strengthening;Both;10 reps;Seated Toe Raises: AROM;Strengthening;Both;10 reps;Seated Heel Raises: AROM;Strengthening;Both;10 reps;Seated     General Comments        Pertinent Vitals/Pain Pain Assessment: No/denies pain    Home Living                      Prior Function            PT Goals (current goals can now be found in the care plan section) Acute Rehab PT Goals Patient Stated Goal: return home with family to assist PT Goal Formulation: With patient/family Time For Goal Achievement: 06/09/20 Potential to Achieve Goals: Good Progress towards PT goals: Progressing toward goals    Frequency    Min 3X/week      PT Plan Current plan remains appropriate    Co-evaluation              AM-PAC PT "6 Clicks" Mobility   Outcome Measure  Help needed turning from your back to your side while in a flat bed without using bedrails?: None Help needed moving from lying on your back to sitting on the side of a flat bed without using bedrails?: None Help needed moving to and from a bed to a chair (including a wheelchair)?: A Little Help needed standing up from a chair using your arms (e.g., wheelchair or bedside chair)?: A Little Help needed to walk in hospital room?: A Little Help needed climbing  3-5 steps with a railing? : A Lot 6 Click Score: 19    End of Session Equipment Utilized During Treatment: Oxygen;Gait belt Activity Tolerance: Patient tolerated treatment well;Patient limited by fatigue Patient left: in chair;with call bell/phone within reach;with family/visitor present;with nursing/sitter in room Nurse Communication: Mobility status PT Visit Diagnosis: Unsteadiness on feet (R26.81);Other abnormalities of gait and mobility (R26.89);Muscle weakness (generalized) (M62.81)     Time: 8299-3716 PT Time Calculation (min) (ACUTE ONLY): 34 min  Charges:  $Gait Training: 8-22 mins $Therapeutic Exercise: 8-22 mins                     9:39 AM , 06/08/20 Karlyn Agee, SPT Physical Therapy with [facility] [phone] office   During this treatment session, the therapist  was present, participating in and directing the treatment.  9:39 AM, 06/08/20 Lonell Grandchild, MPT Physical Therapist with [facility] [phone] office [phone] mobile phone

## 2020-06-08 NOTE — Progress Notes (Signed)
PROGRESS NOTE  Cassandra Parker  DOB: June 08, 1941  PCP: Manon Hilding, MD HYW:[id number]  DOA: 06/05/2020  LOS: 2 days   Chief Complaint  Patient presents with  . Altered Mental Status   Brief narrative: Cassandra Parker is a 79 y.o. female with PMH of DM2, HTN, HLD, CKD 5, complete heart block, nonischemic cardiomyopathy with resolved after pacemaker placement in (4854), diastolic CHF, severe pulmonary hypertension, right breast cancer status post mastectomy, arthritis Patient presented to the ED on 06/05/2020 with confusion and generalized weakness. Recently hospitalized 05/22/2020 - 05/28/2020 due to sepsis from pneumonia complicated with AKI, possible GI bleeding because of which Eliquis was held.  Patient was discharged home on oral antibiotics and 3 L oxygen.  Lasix was resumed at 40 mg daily at the time of discharge.  Since discharge, patient has poor oral intake.  She has been using walker and a cane to ambulate.  Family noted confusion, and generalized weakness, progressively worsening and hence brought to the ED on 9/11.  Labs in the ED showed potassium of 4.8, creatinine 4.05, lactic acid 0.6, WBC 8.9, hemoglobin 8.1 CT scan of the head normal. Chest x-ray showed bilateral upper lobe airspace opacities new on the left and severe on the right raising suspicion of atypical pneumonia.    Patient was admitted to hospitalist service for further evaluation management.  Nephrology consultation was obtained.  Subjective: Patient was seen and examined this morning.   Sitting up in chair.  Not in distress.  On 2 to 3 L oxygen by nasal cannula. No new symptoms. Switched from Lasix drip to oral Lasix this morning.  Assessment/Plan: AKI on CKD 5 Acute exacerbation of chronic diastolic CHF -Was discharged on creatinine 3.7 recently.  Presented with creatinine of 4.05.  Trend as below. -Nephrology consultation obtained. -Per nephrology, patient is having progressive decline in her  renal function for last 1 to 2 years.  Clinically she is volume overloaded.  Chest x-ray suggestive of pulmonary edema.  -Echocardiogram from 8/29 showed LVEF of 60 to 62%, grade 1 diastolic dysfunction elevated RVSP to 64. -Nephrology recommended aggressive diuresis.  Patient was treated with IV Lasix drip.  Switched to oral Lasix this morning.   -Labs this morning show creatinine same as yesterday at 3.62.  Continue to monitor. -Continue Lasix drip, intake and output monitoring, daily weight, blood pressure, creatinine and electrolyte monitoring. -Per nephrology, anticipate discharge home tomorrow on oral Lasix Recent Labs    05/22/20 1115 05/23/20 0735 05/24/20 0622 05/26/20 0558 05/27/20 0550 05/28/20 0846 06/05/20 0852 06/06/20 0900 06/07/20 1019 06/08/20 0558  BUN 73* 75* 68* 65* 63* 63* 94* 85* 82*  81* 84*  83*  CREATININE 3.56* 3.59* 3.57* 4.15* 3.93* 3.70* 4.05* 3.68* 3.69*  3.68* 3.72*  7.03*   Acute metabolic encephalopathy -Multifactorial : progressive decline in renal function, recent hospitalization and prolonged recovery -Also has significant risk factors for vascular dementia.   -No evidence of infection at this time -Mental status back to baseline per family.  Pulmonary infiltrates -Bilateral upper lobe airspace opacities noted in chest x-ray. -Likely secondary to fluid overload and infection. -WBC count, lactic acid and procalcitonin level normal. -Remains stable off antibiotics. Recent Labs  Lab 06/05/20 0852 06/05/20 1307 06/06/20 0900 06/07/20 1019 06/08/20 0558  WBC 8.9  --  7.2 7.5 7.6  LATICACIDVEN 0.6  --   --   --   --   PROCALCITON  --  <0.10  --   --   --  Chronic respiratory failure with hypoxia -The patient was discharged home on 3 L nasal cannula -Currently stable on 3 L  Severe pulmonary hypertension -Recent echo from 8/29 showed EF of 60 to 65% with RVSP elevated to 64 -Needs chronic dialysis as tolerated.  Diabetes  mellitus type 2 -Glipizide on hold.  Currently on sliding scale insulin with Accu-Cheks. Lab Results  Component Value Date   HGBA1C 7.4 (H) 05/22/2020   Recent Labs  Lab 06/07/20 1127 06/07/20 1708 06/07/20 2046 06/08/20 0738 06/08/20 1127  GLUCAP 164* 127* 178* 148* 228*    Essential hypertension -Continue Coreg and Lasix. Continue to hold amlodipine, hydralazine.    Complete heart block  -Status post PPM 2016  Anemia of CKD -Baseline hemoglobin 8-9 -Eliquis was stopped on last admission.  -Hemoglobin stable so far. Recent Labs    05/23/20 0735 05/24/20 0622 05/25/20 0959 05/26/20 0558 05/27/20 0550 05/28/20 0846 06/05/20 0852 06/05/20 1307 06/06/20 0900 06/07/20 1019 06/08/20 0558  HGB 8.6* 8.2* 8.4* 7.8* 7.8* 8.5* 8.1*  --  8.2* 8.6* 8.0*  VITAMINB12  --   --   --   --   --   --  2,304*  --   --   --   --   FOLATE  --   --   --   --   --   --  10.8 11.4  --   --   --   FERRITIN  --   --   --   --   --   --  75  --   --   --   --   TIBC  --   --   --   --   --   --  271  --   --   --   --   IRON  --   --   --   --   --   --  35  --   --   --   --   RETICCTPCT  --   --   --   --   --   --  2.3  --   --   --   --    Hyperlipidemia -Continue statin  History ofRt Breast cancer -s/p chemo and Rt mastectomy 2008 -Stable, follow-up as outpatient  Mobility: Encourage ambulation.  PT eval Code Status:   Code Status: Full Code  Nutritional status: Body mass index is 39.7 kg/m.     Diet Order            Diet heart healthy/carb modified Room service appropriate? Yes; Fluid consistency: Thin; Fluid restriction: 1500 mL Fluid  Diet effective now                 DVT prophylaxis: heparin injection 5,000 Units Start: 06/05/20 1715   Antimicrobials:  None Fluid: None Consultants: Nephrology Family Communication:  Not at bedside  Status is: Inpatient  Remains inpatient appropriate because:Ongoing diagnostic testing needed not appropriate for  outpatient work up   Dispo: The patient is from: Home              Anticipated d/c is to: Home              Anticipated d/c date is: 1 day              Patient currently is not medically stable to d/c.         Infusions:    Scheduled Meds: . carvedilol  6.25 mg Oral BID  . cholecalciferol  2,000 Units Oral Daily  . furosemide  120 mg Oral BID  . heparin  5,000 Units Subcutaneous Q8H  . insulin aspart  0-5 Units Subcutaneous QHS  . insulin aspart  0-9 Units Subcutaneous TID WC  . isosorbide mononitrate  30 mg Oral Daily  . pravastatin  10 mg Oral QHS  . vitamin B-12  1,000 mcg Oral Daily    Antimicrobials: Anti-infectives (From admission, onward)   Start     Dose/Rate Route Frequency Ordered Stop   06/05/20 1115  cefTRIAXone (ROCEPHIN) 1 g in sodium chloride 0.9 % 100 mL IVPB        1 g 200 mL/hr over 30 Minutes Intravenous  Once 06/05/20 1105 06/05/20 1319   06/05/20 1115  azithromycin (ZITHROMAX) 500 mg in sodium chloride 0.9 % 250 mL IVPB        500 mg 250 mL/hr over 60 Minutes Intravenous  Once 06/05/20 1105 06/05/20 1507      PRN meds: acetaminophen **OR** acetaminophen, ondansetron **OR** ondansetron (ZOFRAN) IV   Objective: Vitals:   06/07/20 2050 06/08/20 0519  BP: (!) 166/57 (!) 145/49  Pulse: 87 72  Resp: 20 20  Temp: 98.8 F (37.1 C) 98.9 F (37.2 C)  SpO2: 95% 94%    Intake/Output Summary (Last 24 hours) at 06/08/2020 1411 Last data filed at 06/07/2020 2300 Gross per 24 hour  Intake 240 ml  Output 600 ml  Net -360 ml   Filed Weights   06/06/20 0619 06/07/20 0500 06/08/20 0519  Weight: 108.7 kg 107.9 kg 104.9 kg   Weight change: -3 kg Body mass index is 39.7 kg/m.   Physical Exam: General exam: Appears calm and comfortable.  Not in physical distress Skin: No rashes, lesions or ulcers. HEENT: Atraumatic, normocephalic, supple neck, no obvious bleeding Lungs: Diminished air entry in both bases.  Otherwise clear to auscultation CVS:  Regular rate and rhythm, no murmur GI/Abd soft, nontender, nondistended, bowel sound present CNS: Alert, awake, oriented to place and person Psychiatry: Depressed look Extremities: Improving bilateral pedal edema  Data Review: I have personally reviewed the laboratory data and studies available.  Recent Labs  Lab 06/05/20 0852 06/06/20 0900 06/07/20 1019 06/08/20 0558  WBC 8.9 7.2 7.5 7.6  NEUTROABS  --   --  5.0 4.3  HGB 8.1* 8.2* 8.6* 8.0*  HCT 27.6* 27.7* 28.7* 26.7*  MCV 96.2 95.8 96.0 94.7  PLT 458* 423* 423* 385   Recent Labs  Lab 06/05/20 0852 06/06/20 0900 06/07/20 1019 06/08/20 0558  NA 143 142 142  143 142  143  K 4.8 4.2 3.8  3.8 3.6  3.6  CL 111 110 105  105 104  105  CO2 21* 22 24  24 27  27   GLUCOSE 169* 147* 182*  184* 165*  166*  BUN 94* 85* 82*  81* 84*  83*  CREATININE 4.05* 3.68* 3.69*  3.68* 3.72*  3.62*  CALCIUM 8.6* 8.8* 9.0  9.1 8.8*  8.8*  MG  --   --  1.8 1.8  PHOS  --  6.3* 5.0*  4.9* 5.0*    F/u labs ordered.  Signed, Terrilee Croak, MD [facility] 06/08/2020

## 2020-06-08 NOTE — Progress Notes (Signed)
[facility] Progress Note    Assessment/ Plan:   1. CKD 5 with hypervolemia - f/b CKA at our [place] office. Recently patient received AV fistula with VVS.  Encephalopathy is improving and patient is diuretic responsive; no current indication to initiate HD.  Transition to oral Lasix, was on 40 mg daily, resume at 120 mg twice daily.  If stable volume status and renal function in the morning, would be okay for discharge with close follow-up.   2. AMS -resolved 3. H/o NICM - resolved w/ PPM placement  4. DM2 - on oral agents 5. HTN - longstanding 6. Debility/ morbid obesity 7. H/o PPM  Subjective:     No acute vents  2L UOP on IV lasix  3L [place]  OOB ot chair, eating breakfast,     Objective:   BP (!) 145/49 (BP Location: Left Arm)   Pulse 72   Temp 98.9 F (37.2 C) (Oral)   Resp 20   Ht 5\' 4"  (1.626 m)   Wt 104.9 kg   SpO2 94%   BMI 39.70 kg/m   Intake/Output Summary (Last 24 hours) at 06/08/2020 2725 Last data filed at 06/07/2020 2300 Gross per 24 hour  Intake 480 ml  Output 1050 ml  Net -570 ml   Weight change: -3 kg  Physical Exam: Gen: older woman, sitting up in chair, NAD CVS: RRR Resp: normal WOB, on [place] O2, faint bibasilar crackles Abd: soft, obese Ext: 2+ bilateral LE edema ACCESS: + AVF +b/T  Imaging: US RENAL  Result Date: 06/06/2020 CLINICAL DATA:  79 year old with stage 5 chronic kidney disease for which the patient had a dialysis AV fistula placed in the RIGHT arm on 05/18/2020. EXAM: RENAL / URINARY TRACT ULTRASOUND COMPLETE COMPARISON:  Report of urinary tract ultrasound at [facility] dated 03/06/2018. FINDINGS: Right Kidney: Renal measurements: Approximately 11.0 x 4.9 x 3.6 cm = volume: 102 mL. No hydronephrosis. Echogenic parenchyma. Diffuse cortical thinning. Benign cysts arising from the upper pole, the largest measuring approximately 1.4 cm. No solid renal masses. No visible shadowing calculi. Left Kidney: Renal  measurements: Approximately 10.9 x 5.5 x 4.1 cm = volume: 128 mL. No hydronephrosis. Echogenic parenchyma. Diffuse cortical thinning. No focal parenchymal abnormalities. No visible shadowing calculi. Bladder: Normal for degree of bladder distention. No ureteral jets visible at Doppler evaluation. Other: None. IMPRESSION: 1. No evidence of hydronephrosis to suggest urinary tract obstruction. 2. Echogenic parenchyma and diffuse cortical thinning involving both kidneys indicating chronic kidney disease. 3. Benign cysts arising from the upper pole of the RIGHT kidney. No solid renal masses. 4. Normal appearing bladder. No ureteral jets were visible at Doppler evaluation. Electronically Signed   By: Evangeline Dakin M.D.   On: 06/06/2020 11:59    Labs: BMET Recent Labs  Lab 06/05/20 0852 06/06/20 0900 06/07/20 1019 06/08/20 0558  NA 143 142 142  143 142  143  K 4.8 4.2 3.8  3.8 3.6  3.6  CL 111 110 105  105 104  105  CO2 21* 22 24  24 27  27   GLUCOSE 169* 147* 182*  184* 165*  166*  BUN 94* 85* 82*  81* 84*  83*  CREATININE 4.05* 3.68* 3.69*  3.68* 3.72*  3.62*  CALCIUM 8.6* 8.8* 9.0  9.1 8.8*  8.8*  PHOS  --  6.3* 5.0*  4.9* 5.0*   CBC Recent Labs  Lab 06/05/20 0852 06/06/20 0900 06/07/20 1019 06/08/20 0558  WBC 8.9 7.2 7.5 7.6  NEUTROABS  --   --  5.0 4.3  HGB 8.1* 8.2* 8.6* 8.0*  HCT 27.6* 27.7* 28.7* 26.7*  MCV 96.2 95.8 96.0 94.7  PLT 458* 423* 423* 385    Medications:    . carvedilol  6.25 mg Oral BID  . cholecalciferol  2,000 Units Oral Daily  . furosemide  120 mg Oral BID  . heparin  5,000 Units Subcutaneous Q8H  . insulin aspart  0-5 Units Subcutaneous QHS  . insulin aspart  0-9 Units Subcutaneous TID WC  . isosorbide mononitrate  30 mg Oral Daily  . pravastatin  10 mg Oral QHS  . vitamin B-12  1,000 mcg Oral Daily      Rexene Agent , MD 06/08/2020, 9:52 AM

## 2020-06-09 DIAGNOSIS — I5033 Acute on chronic diastolic (congestive) heart failure: Secondary | ICD-10-CM

## 2020-06-09 LAB — CBC WITH DIFFERENTIAL/PLATELET
Abs Immature Granulocytes: 0.02 10*3/uL (ref 0.00–0.07)
Basophils Absolute: 0.1 10*3/uL (ref 0.0–0.1)
Basophils Relative: 1 %
Eosinophils Absolute: 0.2 10*3/uL (ref 0.0–0.5)
Eosinophils Relative: 3 %
HCT: 27.7 % — ABNORMAL LOW (ref 36.0–46.0)
Hemoglobin: 8.3 g/dL — ABNORMAL LOW (ref 12.0–15.0)
Immature Granulocytes: 0 %
Lymphocytes Relative: 31 %
Lymphs Abs: 2.2 10*3/uL (ref 0.7–4.0)
MCH: 28.5 pg (ref 26.0–34.0)
MCHC: 30 g/dL (ref 30.0–36.0)
MCV: 95.2 fL (ref 80.0–100.0)
Monocytes Absolute: 0.9 10*3/uL (ref 0.1–1.0)
Monocytes Relative: 13 %
Neutro Abs: 3.8 10*3/uL (ref 1.7–7.7)
Neutrophils Relative %: 52 %
Platelets: 375 10*3/uL (ref 150–400)
RBC: 2.91 MIL/uL — ABNORMAL LOW (ref 3.87–5.11)
RDW: 15.4 % (ref 11.5–15.5)
WBC: 7.2 10*3/uL (ref 4.0–10.5)
nRBC: 0 % (ref 0.0–0.2)

## 2020-06-09 LAB — GLUCOSE, CAPILLARY
Glucose-Capillary: 160 mg/dL — ABNORMAL HIGH (ref 70–99)
Glucose-Capillary: 170 mg/dL — ABNORMAL HIGH (ref 70–99)

## 2020-06-09 LAB — BASIC METABOLIC PANEL
Anion gap: 10 (ref 5–15)
BUN: 80 mg/dL — ABNORMAL HIGH (ref 8–23)
CO2: 30 mmol/L (ref 22–32)
Calcium: 8.9 mg/dL (ref 8.9–10.3)
Chloride: 101 mmol/L (ref 98–111)
Creatinine, Ser: 3.36 mg/dL — ABNORMAL HIGH (ref 0.44–1.00)
GFR calc Af Amer: 15 mL/min — ABNORMAL LOW (ref >60)
GFR calc non Af Amer: 13 mL/min — ABNORMAL LOW (ref >60)
Glucose, Bld: 164 mg/dL — ABNORMAL HIGH (ref 70–99)
Potassium: 3.4 mmol/L — ABNORMAL LOW (ref 3.5–5.1)
Sodium: 141 mmol/L (ref 135–145)

## 2020-06-09 LAB — MAGNESIUM: Magnesium: 1.7 mg/dL (ref 1.7–2.4)

## 2020-06-09 MED ORDER — FUROSEMIDE 40 MG PO TABS: 120.0000 mg | ORAL_TABLET | Freq: Two times a day (BID) | ORAL | 0 refills | Status: AC

## 2020-06-09 MED ORDER — POTASSIUM CHLORIDE CRYS ER 20 MEQ PO TBCR: 10.0000 meq | EXTENDED_RELEASE_TABLET | Freq: Every day | ORAL | 0 refills | Status: AC

## 2020-06-09 MED ORDER — SODIUM CHLORIDE 0.9 % IV SOLN
510.0000 mg | Freq: Once | INTRAVENOUS | Status: AC
  Administered 2020-06-09: 510 mg via INTRAVENOUS
  Filled 2020-06-09: qty 510

## 2020-06-09 NOTE — Plan of Care (Signed)

## 2020-06-09 NOTE — Plan of Care (Signed)
  Problem: Education: Goal: Knowledge of General Education information will improve Description: Including pain rating scale, medication(s)/side effects and non-pharmacologic comfort measures 06/09/2020 1045 by Santa Lighter, RN Outcome: Adequate for Discharge 06/09/2020 1043 by Santa Lighter, RN Outcome: Progressing   Problem: Clinical Measurements: Goal: Ability to maintain clinical measurements within normal limits will improve 06/09/2020 1045 by Santa Lighter, RN Outcome: Adequate for Discharge 06/09/2020 1043 by Santa Lighter, RN Outcome: Progressing Goal: Will remain free from infection Outcome: Adequate for Discharge Goal: Diagnostic test results will improve Outcome: Adequate for Discharge Goal: Respiratory complications will improve Outcome: Adequate for Discharge Goal: Cardiovascular complication will be avoided Outcome: Adequate for Discharge   Problem: Activity: Goal: Risk for activity intolerance will decrease Outcome: Adequate for Discharge   Problem: Nutrition: Goal: Adequate nutrition will be maintained Outcome: Adequate for Discharge   Problem: Coping: Goal: Level of anxiety will decrease Outcome: Adequate for Discharge   Problem: Elimination: Goal: Will not experience complications related to bowel motility Outcome: Adequate for Discharge Goal: Will not experience complications related to urinary retention Outcome: Adequate for Discharge   Problem: Pain Managment: Goal: General experience of comfort will improve Outcome: Adequate for Discharge   Problem: Safety: Goal: Ability to remain free from injury will improve Outcome: Adequate for Discharge   Problem: Skin Integrity: Goal: Risk for impaired skin integrity will decrease Outcome: Adequate for Discharge

## 2020-06-09 NOTE — TOC Transition Note (Signed)
Transition of Care [facility]) - CM/SW Discharge Note   Patient Details  Name: Cassandra Parker MRN: [id number] Date of Birth: [birth year]  Transition of Care [facility] CM/SW Contact:  Salome Arnt, LCSW Phone Number: 06/09/2020, 11:17 AM   Clinical Narrative:  Pt d/c today. LCSW spoke with pt's daughter, Olin Hauser who requests order for lift chair. She is aware to contact PCP. Discussed making hospital follow up appointment and Olin Hauser states she will schedule appointment in the next week. She requests home health PT and RN. Pt was referred to [facility] earlier in the week. MD and [facility] notified.      Final next level of care: [place] Barriers to Discharge: Barriers Resolved   Patient Goals and CMS Choice Patient states their goals for this hospitalization and ongoing recovery are:: Return home with [facility]   Choice offered to / list presented to : Patient, Adult Children  Discharge Placement                  Name of family member notified: Olin Hauser daughter Patient and family notified of of transfer: 06/09/20  Discharge Plan and Services                          HH Arranged: RN, PT [facility] Agency: [place] Date [facility] Agency Contacted: 06/09/20 Time [place]: 1117 Representative spoke with at [place]: [place]) Interventions     Readmission Risk Interventions Readmission Risk Prevention Plan 05/27/2020  Transportation Screening Complete  HRI or [place] Complete  Social Work Consult for [place] Planning/Counseling Complete  Palliative Care Screening Not Applicable  Medication Review Press photographer) Complete  Some recent data might be hidden

## 2020-06-09 NOTE — Discharge Summary (Addendum)
Physician Discharge Summary  [name]:[id number] DOB: [birth year] DOA: 06/05/2020  PCP: Manon Hilding, MD  Admit date: 06/05/2020 Discharge date: 06/09/2020  Admitted From: Home Discharge disposition: Home with home health RN, PT   Code Status: Full Code  Diet Recommendation: Cardiac/diabetic diet  Discharge Diagnosis:   Principal Problem:   Acute on chronic diastolic CHF (congestive heart failure) ([place]) Active Problems:   CHB (complete heart block) ([place])   HTN (hypertension)   Acute metabolic encephalopathy   Acute renal failure superimposed on stage 4 chronic kidney disease ([place])   Chronic respiratory failure with hypoxia ([place])  History of Present Illness / Brief narrative:  Cassandra Parker is a 79 y.o. female with PMH of DM2, HTN, HLD, CKD 5, complete heart block, nonischemic cardiomyopathy with resolved after pacemaker placement in (8119), diastolic CHF, severe pulmonary hypertension, right breast cancer status post mastectomy, arthritis Patient presented to the ED on 06/05/2020 with confusion and generalized weakness. Recently hospitalized 05/22/2020 - 05/28/2020 due to sepsis from pneumonia complicated with AKI, possible GI bleeding because of which Eliquis was held.  Patient was discharged home on oral antibiotics and 3 L oxygen.  Lasix was resumed at 40 mg daily at the time of discharge.  Since discharge, patient has poor oral intake.  She has been using walker and a cane to ambulate.  Family noted confusion, and generalized weakness, progressively worsening and hence brought to the ED on 9/11.  Labs in the ED showed potassium of 4.8, creatinine 4.05, lactic acid 0.6, WBC 8.9, hemoglobin 8.1 CT scan of the head normal. Chest x-ray showed bilateral upper lobe airspace opacities new on the left and severe on the right raising suspicion of atypical pneumonia.    Patient was admitted to hospitalist service for further evaluation management.  Nephrology  consultation was obtained.  Subjective:  Seen and examined this morning.  Not in distress.  Daughter at bedside  Hospital Course:  AKI on CKD 5 Acute exacerbation of chronic diastolic CHF -Was discharged on creatinine 3.7 recently.  Presented with creatinine of 4.05.  Trend as below. -Nephrology consultation obtained. -Per nephrology, patient is having progressive decline in her renal function for last 1 to 2 years.  On arrival, she was volume overloaded.  Chest x-ray suggestive of pulmonary edema.  -Nephrology recommended aggressive diuresis.  Patient was treated with IV Lasix drip.  Switched to oral Lasix 120 mg twice daily yesterday.  Labs this morning show improving creatinine. -Continue oral Lasix at home.  Previously patient was on 10 mEq of potassium supplement daily.  I increased it to 20 mg because she will be on higher dose of Lasix -Echocardiogram from 8/29 showed LVEF of 60 to 14%, grade 1 diastolic dysfunction elevated RVSP to 64. Recent Labs    05/23/20 0735 05/24/20 0622 05/26/20 0558 05/27/20 0550 05/28/20 0846 06/05/20 0852 06/06/20 0900 06/07/20 1019 06/08/20 0558 06/09/20 0600  BUN 75* 68* 65* 63* 63* 94* 85* 82*  81* 84*  83* 80*  CREATININE 3.59* 3.57* 4.15* 3.93* 3.70* 4.05* 3.68* 3.69*  3.68* 3.72*  3.62* 7.82*   Acute metabolic encephalopathy -Multifactorial : progressive decline in renal function, recent hospitalization and prolonged recovery -Also has significant risk factors for vascular dementia.   -No evidence of infection at this time -Mental status back to baseline per family. Recent Labs  Lab 06/05/20 0852 06/05/20 1307  AMMONIA  --  18  VITAMINB12 2,304*  --   FOLATE 10.8 11.4  TSH  --  4.236   Pulmonary infiltrates -Bilateral upper lobe airspace opacities noted in chest x-ray. -Likely secondary to fluid overload and infection. -WBC count, lactic acid and procalcitonin level normal. -Remains stable off antibiotics. Recent Labs    Lab 06/05/20 0852 06/05/20 1307 06/06/20 0900 06/07/20 1019 06/08/20 0558 06/09/20 0600  WBC 8.9  --  7.2 7.5 7.6 7.2  LATICACIDVEN 0.6  --   --   --   --   --   PROCALCITON  --  <0.10  --   --   --   --    Chronic respiratory failure with hypoxia -Currently stable on 3 L  Severe pulmonary hypertension -Recent echo from 8/29 showed EF of 60 to 65% with RVSP elevated to 64 -Needs chronic dialysis as tolerated.  Diabetes mellitus type 2 -Resume Glipizide post discharge.   Lab Results  Component Value Date   HGBA1C 7.4 (H) 05/22/2020   Recent Labs  Lab 06/08/20 0738 06/08/20 1127 06/08/20 1705 06/08/20 2122 06/09/20 0742  GLUCAP 148* 228* 137* 153* 160*   Essential hypertension -Currently on Coreg, Imdur and Lasix.  Amlodipine and hydralazine are on hold.  Blood pressure in 140s.  Continue to hold amlodipine, hydralazine post discharge.  Follow-up with PCP/nephrology for further adjustment.  Complete heart block -Status post PPM 2016  Anemia of CKD -Baseline hemoglobin 8-9 -Eliquis was stopped on last admission.  -Hemoglobin stable so far. Recent Labs    06/05/20 0852 06/05/20 0852 06/05/20 1307 06/06/20 0900 06/06/20 0900 06/07/20 1019 06/07/20 1019 06/08/20 0558 06/09/20 0600  HGB 8.1*  --   --  8.2*  --  8.6*  --  8.0* 8.3*  MCV 96.2   < >  --  95.8   < > 96.0   < > 94.7 95.2  VITAMINB12 2,304*  --   --   --   --   --   --   --   --   FOLATE 10.8  --  11.4  --   --   --   --   --   --   FERRITIN 75  --   --   --   --   --   --   --   --   TIBC 271  --   --   --   --   --   --   --   --   IRON 35  --   --   --   --   --   --   --   --   RETICCTPCT 2.3  --   --   --   --   --   --   --   --    < > = values in this interval not displayed.   Hyperlipidemia -Continue statin  History ofRt Breast cancer -s/p chemo and Rt mastectomy 2008 -Stable, follow-up as outpatient  Stable for discharge home today.  Wound care: Incision (Closed) 05/18/20  Arm Right (Active)  Date First Assessed/Time First Assessed: 05/18/20 1117   Location: Arm  Location Orientation: Right    Assessments 05/18/2020 11:59 AM 06/08/2020  9:30 AM  Dressing Type Liquid skin adhesive None  Dressing Clean;Dry;Intact --  Site / Wound Assessment Clean;Dry --  Margins -- Attached edges (approximated)  Closure -- Adhesive strips  Drainage Amount -- None     No Linked orders to display    Discharge Exam:   Vitals:   06/08/20 0519 06/08/20 1457 06/08/20 2122 06/09/20  0626  BP: (!) 145/49 (!) 127/47 (!) 163/54 (!) 148/81  Pulse: 72 64 66 72  Resp: 20 16 20 20   Temp: 98.9 F (37.2 C) 98.4 F (36.9 C) 98.7 F (37.1 C) 98 F (36.7 C)  TempSrc: Oral  Oral Oral  SpO2: 94% 97% 98% 95%  Weight: 104.9 kg     Height:        Body mass index is 39.7 kg/m.  General exam: Appears calm and comfortable.  Not in physical distress Skin: No rashes, lesions or ulcers. HEENT: Atraumatic, normocephalic, supple neck, no obvious bleeding Lungs: Clear to auscultation bilaterally CVS: Regular rate and rhythm, no murmur GI/Abd soft, nontender, nondistended, bowel sound present CNS: Alert, awake, oriented x3 Psychiatry: Mood appropriate Extremities: Trace bilateral pedal edema  Follow ups:   Discharge Instructions    Diet - low sodium heart healthy   Complete by: As directed    Diet Carb Modified   Complete by: As directed    Increase activity slowly   Complete by: As directed    No dressing needed   Complete by: As directed       Follow-up Information    [facility] Follow up.   Specialty: Home Health Services Why: HHPT Contact information: [place] [address] [phone]        Manon Hilding, MD Follow up.   Specialty: Family Medicine Contact information: [place] [address] [phone]        Evans Lance, MD .   Specialty: Cardiology Contact information: [place]  [address] [phone]        Rexene Agent, MD Follow up.   Specialty: Nephrology Contact information: [place] [address] [phone]               Recommendations for Outpatient Follow-Up:   1. Follow-up with PCP as an outpatient 2. Follow-up with nephrology as an outpatient  Discharge Instructions:  Follow with Primary MD Quintin [place], [name], MD in 7 days   Get CBC/BMP checked in next visit within 1 week by PCP or SNF MD ( we routinely change or add medications that can affect your baseline labs and fluid status, therefore we recommend that you get the mentioned basic workup next visit with your PCP, your PCP may decide not to get them or add new tests based on their clinical decision)  On your next visit with your PCP, please Get Medicines reviewed and adjusted.  Please request your PCP  to go over all Hospital Tests and Procedure/Radiological results at the follow up, please get all Hospital records sent to your Prim MD by signing hospital release before you go home.  Activity: As tolerated with Full fall precautions use walker/cane & assistance as needed  For Heart failure patients - Check your Weight same time everyday, if you gain over 2 pounds, or you develop in leg swelling, experience more shortness of breath or chest pain, call your Primary MD immediately. Follow Cardiac Low Salt Diet and 1.5 lit/day fluid restriction.  If you have smoked or chewed Tobacco in the last 2 yrs please stop smoking, stop any regular Alcohol  and or any Recreational drug use.  If you experience worsening of your admission symptoms, develop shortness of breath, life threatening emergency, suicidal or homicidal thoughts you must seek medical attention immediately by calling 911 or calling your MD immediately  if symptoms less severe.  You Must read complete instructions/literature along with all the possible adverse reactions/side effects for all the Medicines you take and  that have been prescribed to you. Take any new Medicines after you have completely understood and accpet all the possible adverse reactions/side effects.   Do not drive, operate heavy machinery, perform activities at heights, swimming or participation in water activities or provide baby sitting services if your were admitted for syncope or siezures until you have seen by Primary MD or a Neurologist and advised to do so again.  Do not drive when taking Pain medications.  Do not take more than prescribed Pain, Sleep and Anxiety Medications  Wear Seat belts while driving.   Please note You were cared for by a hospitalist during your hospital stay. If you have any questions about your discharge medications or the care you received while you were in the hospital after you are discharged, you can call the unit and asked to speak with the hospitalist on call if the hospitalist that took care of you is not available. Once you are discharged, your primary care physician will handle any further medical issues. Please note that NO REFILLS for any discharge medications will be authorized once you are discharged, as it is imperative that you return to your primary care physician (or establish a relationship with a primary care physician if you do not have one) for your aftercare needs so that they can reassess your need for medications and monitor your lab values.    Allergies as of 06/09/2020   No Known Allergies     Medication List    STOP taking these medications   amLODipine 10 MG tablet Commonly known as: NORVASC   hydrALAZINE 50 MG tablet Commonly known as: APRESOLINE     TAKE these medications   acetaminophen 325 MG tablet Commonly known as: TYLENOL Take 2 tablets (650 mg total) by mouth every 6 (six) hours as needed for mild pain or headache (or Fever >/= 101).   albuterol (2.5 MG/3ML) 0.083% nebulizer solution Commonly known as: PROVENTIL Take 3 mLs (2.5 mg total) by nebulization every  2 (two) hours as needed for wheezing or shortness of breath.   carvedilol 6.25 MG tablet Commonly known as: COREG Take 1 tablet (6.25 mg total) by mouth 2 (two) times daily.   furosemide 40 MG tablet Commonly known as: LASIX Take 3 tablets (120 mg total) by mouth 2 (two) times daily. What changed:   how much to take  when to take this   glipiZIDE 5 MG 24 hr tablet Commonly known as: GLUCOTROL XL Take 5 mg by mouth See admin instructions. Take 10 mg in the morning and 5 mg at night   guaiFENesin 600 MG 12 hr tablet Commonly known as: MUCINEX Take 1 tablet (600 mg total) by mouth 2 (two) times daily.   isosorbide mononitrate 30 MG 24 hr tablet Commonly known as: IMDUR Take 1 tablet (30 mg total) by mouth daily.   pantoprazole 40 MG tablet Commonly known as: PROTONIX Take 1 tablet (40 mg total) by mouth daily.   potassium chloride SA 20 MEQ tablet Commonly known as: KLOR-CON Take 0.5 tablets (10 mEq total) by mouth daily. What changed: medication strength   pravastatin 10 MG tablet Commonly known as: PRAVACHOL Take 10 mg by mouth at bedtime.   vitamin B-12 1000 MCG tablet Commonly known as: CYANOCOBALAMIN Take 1,000 mcg by mouth daily.   Vitamin D 50 MCG (2000 UT) tablet Take 2,000 Units by  mouth daily.            Discharge Care Instructions  (From admission, onward)         Start     Ordered   06/09/20 0000  No dressing needed        06/09/20 1028          Time coordinating discharge: 35 minutes  The results of significant diagnostics from this hospitalization (including imaging, microbiology, ancillary and laboratory) are listed below for reference.    Procedures and Diagnostic Studies:   CT HEAD WO CONTRAST  Result Date: 06/05/2020 CLINICAL DATA:  Altered mental status EXAM: CT HEAD WITHOUT CONTRAST TECHNIQUE: Contiguous axial images were obtained from the base of the skull through the vertex without intravenous contrast. COMPARISON:  None.  FINDINGS: Brain: There is age related volume loss. There is no intracranial mass, hemorrhage, extra-axial fluid collection, or midline shift. There is evidence of a prior infarct in the superior left parietal lobe. There is mild periventricular small vessel disease in the centra semiovale bilaterally. No acute appearing infarct is demonstrable on this study. Vascular: There is no hyperdense vessel. There is calcification in each carotid siphon region. Skull: The bony calvarium appears intact. Sinuses/Orbits: There is mild mucosal thickening in several ethmoid air cells. Other visualized paranasal sinuses are clear. Visualized orbits appear symmetric bilaterally. Other: Visualized mastoid air cells are clear. IMPRESSION: 1. Prior left parietal lobe infarct. Mild periventricular small vessel disease. No acute infarct demonstrable on this study. No mass or hemorrhage. 2.  There are foci of arterial vascular calcification. 3.  There is mild mucosal thickening in several ethmoid air cells. Electronically Signed   By: Lowella Grip III M.D.   On: 06/05/2020 08:41   US RENAL  Result Date: 06/06/2020 CLINICAL DATA:  79 year old with stage 5 chronic kidney disease for which the patient had a dialysis AV fistula placed in the RIGHT arm on 05/18/2020. EXAM: RENAL / URINARY TRACT ULTRASOUND COMPLETE COMPARISON:  Report of urinary tract ultrasound at [facility] dated 03/06/2018. FINDINGS: Right Kidney: Renal measurements: Approximately 11.0 x 4.9 x 3.6 cm = volume: 102 mL. No hydronephrosis. Echogenic parenchyma. Diffuse cortical thinning. Benign cysts arising from the upper pole, the largest measuring approximately 1.4 cm. No solid renal masses. No visible shadowing calculi. Left Kidney: Renal measurements: Approximately 10.9 x 5.5 x 4.1 cm = volume: 128 mL. No hydronephrosis. Echogenic parenchyma. Diffuse cortical thinning. No focal parenchymal abnormalities. No visible shadowing calculi. Bladder: Normal  for degree of bladder distention. No ureteral jets visible at Doppler evaluation. Other: None. IMPRESSION: 1. No evidence of hydronephrosis to suggest urinary tract obstruction. 2. Echogenic parenchyma and diffuse cortical thinning involving both kidneys indicating chronic kidney disease. 3. Benign cysts arising from the upper pole of the RIGHT kidney. No solid renal masses. 4. Normal appearing bladder. No ureteral jets were visible at Doppler evaluation. Electronically Signed   By: Evangeline Dakin M.D.   On: 06/06/2020 11:59   DG Chest Port 1 View  Result Date: 06/05/2020 CLINICAL DATA:  Altered mental status EXAM: PORTABLE CHEST 1 VIEW COMPARISON:  May 28, 2020 FINDINGS: There is airspace opacity in each upper lobe region, similar on the right and increased on the left. Lungs otherwise are clear. There is cardiomegaly with pulmonary venous hypertension. Pacemaker leads are attached to the right atrium, right ventricle, and coronary sinus. No adenopathy. No bone lesions. IMPRESSION: Bilateral upper lobe airspace opacity, new on the left and similar on the right. Question  atypical organism pneumonia. Stable cardiomegaly with a degree of pulmonary vascular congestion. Stable pacemaker lead placements. No adenopathy evident by radiography. Electronically Signed   By: Lowella Grip III M.D.   On: 06/05/2020 08:38     Labs:   Basic Metabolic Panel: Recent Labs  Lab 06/05/20 0852 06/05/20 0852 06/06/20 0900 06/06/20 0900 06/07/20 1019 06/07/20 1019 06/08/20 0558 06/09/20 0600  NA 143  --  142  --  142  143  --  142  143 141  K 4.8   < > 4.2   < > 3.8  3.8   < > 3.6  3.6 3.4*  CL 111  --  110  --  105  105  --  104  105 101  CO2 21*  --  22  --  24  24  --  27  27 30   GLUCOSE 169*  --  147*  --  182*  184*  --  165*  166* 164*  BUN 94*  --  85*  --  82*  81*  --  84*  83* 80*  CREATININE 4.05*  --  3.68*  --  3.69*  3.68*  --  3.72*  3.62* 3.36*  CALCIUM 8.6*  --  8.8*   --  9.0  9.1  --  8.8*  8.8* 8.9  MG  --   --   --   --  1.8  --  1.8 1.7  PHOS  --   --  6.3*  --  5.0*  4.9*  --  5.0*  --    < > = values in this interval not displayed.   GFR Estimated Creatinine Clearance: 16.8 mL/min (A) (by C-G formula based on SCr of 3.36 mg/dL (H)). Liver Function Tests: Recent Labs  Lab 06/05/20 0852 06/06/20 0900 06/07/20 1019 06/08/20 0558  AST 61*  --   --   --   ALT 113*  --   --   --   ALKPHOS 79  --   --   --   BILITOT 0.5  --   --   --   PROT 7.1  --   --   --   ALBUMIN 3.3* 3.1* 3.1* 2.9*   No results for input(s): LIPASE, AMYLASE in the last 168 hours. Recent Labs  Lab 06/05/20 1307  AMMONIA 18   Coagulation profile No results for input(s): INR, PROTIME in the last 168 hours.  CBC: Recent Labs  Lab 06/05/20 0852 06/06/20 0900 06/07/20 1019 06/08/20 0558 06/09/20 0600  WBC 8.9 7.2 7.5 7.6 7.2  NEUTROABS  --   --  5.0 4.3 3.8  HGB 8.1* 8.2* 8.6* 8.0* 8.3*  HCT 27.6* 27.7* 28.7* 26.7* 27.7*  MCV 96.2 95.8 96.0 94.7 95.2  PLT 458* 423* 423* 385 375   Cardiac Enzymes: No results for input(s): CKTOTAL, CKMB, CKMBINDEX, TROPONINI in the last 168 hours. BNP: Invalid input(s): POCBNP CBG: Recent Labs  Lab 06/08/20 0738 06/08/20 1127 06/08/20 1705 06/08/20 2122 06/09/20 0742  GLUCAP 148* 228* 137* 153* 160*   D-Dimer No results for input(s): DDIMER in the last 72 hours. Hgb A1c No results for input(s): HGBA1C in the last 72 hours. Lipid Profile No results for input(s): CHOL, HDL, LDLCALC, TRIG, CHOLHDL, LDLDIRECT in the last 72 hours. Thyroid function studies No results for input(s): TSH, T4TOTAL, T3FREE, THYROIDAB in the last 72 hours.  Invalid input(s): FREET3 Anemia work up No results for input(s): VITAMINB12, FOLATE, FERRITIN, TIBC, IRON, RETICCTPCT  in the last 72 hours. Microbiology Recent Results (from the past 240 hour(s))  SARS Coronavirus 2 by RT PCR (hospital order, performed in [facility] hospital lab)  Nasopharyngeal Nasopharyngeal Swab     Status: None   Collection Time: 06/05/20  7:58 AM   Specimen: Nasopharyngeal Swab  Result Value Ref Range Status   SARS Coronavirus 2 NEGATIVE NEGATIVE Final    Comment: (NOTE) SARS-CoV-2 target nucleic acids are NOT DETECTED.  The SARS-CoV-2 RNA is generally detectable in upper and lower respiratory specimens during the acute phase of infection. The lowest concentration of SARS-CoV-2 viral copies this assay can detect is 250 copies / mL. A negative result does not preclude SARS-CoV-2 infection and should not be used as the sole basis for treatment or other patient management decisions.  A negative result may occur with improper specimen collection / handling, submission of specimen other than nasopharyngeal swab, presence of viral mutation(s) within the areas targeted by this assay, and inadequate number of viral copies (<250 copies / mL). A negative result must be combined with clinical observations, patient history, and epidemiological information.  Fact Sheet for Patients:   StrictlyIdeas.no  Fact Sheet for Healthcare Providers: BankingDealers.co.za  This test is not yet approved or  cleared by the [place] FDA and has been authorized for detection and/or diagnosis of SARS-CoV-2 by FDA under an Emergency Use Authorization (EUA).  This EUA will remain in effect (meaning this test can be used) for the duration of the COVID-19 declaration under Section 564(b)(1) of the Act, 21 U.S.C. section 360bbb-3(b)(1), unless the authorization is terminated or revoked sooner.  Performed at [facility], [address] [place], [place] [address]      Signed: Terrilee Croak  [facility] 06/09/2020, 10:28 AM

## 2020-06-09 NOTE — Progress Notes (Signed)
Pt discharged via WC to POV with home O2 on at 3lpm [place] and all personal belongings in her possession (2 rings, 2 pair earrings, clothing, shoes). Discharge instructions reviewed with both patient and with daughter. Both state understanding.

## 2020-06-09 NOTE — Progress Notes (Signed)
Pt ready for discharge, family did not have home O2 tank here for t [place]. Pt states, "I'll be alright without it, its only about 20 minutes away". Asked pt to do trial test wih out O2 to see what SaO2 would be, family and pt agreeable. O2 stopped for 10 minutes, pt's SaO2 dropped to 85-86% on RA. O2 replaced at 3 lpm [place], SaO2 up to 95%. Pt's daughter has gone home to get O2 tank for transport at discharge.

## 2020-06-09 NOTE — Progress Notes (Signed)
  [facility] Progress Note    Assessment/ Plan:   1. CKD 5 with hypervolemia - f/b CKA at our [place] office. Recently patient received AV fistula with VVS.  Encephalopathy is improving and patient is diuretic responsive; no current indication to initiate HD.  Tolerating and responding to PO diuretics with stable/improved GFR.  OK for dc with close f/u at our office, I will make sure this is arranged.   2. AMS -resolved 3. H/o NICM - resolved w/ PPM placement  4. DM2 - on oral agents 5. HTN - longstanding 6. Debility/ morbid obesity 7. H/o PPM 8. Anemi,a will f/u as outpt and consider ESA, rec TSAT 13%, IV Fe prior to DC today.   Subjective:     No acute vents  > 1.4L UOP on PO lasix  SCr down to 3.4  Feels good, on 2L [place]    Objective:   BP (!) 148/81 (BP Location: Left Arm)   Pulse 72   Temp 98 F (36.7 C) (Oral)   Resp 20   Ht 5\' 4"  (1.626 m)   Wt 104.9 kg   SpO2 95%   BMI 39.70 kg/m   Intake/Output Summary (Last 24 hours) at 06/09/2020 9833 Last data filed at 06/09/2020 0600 Gross per 24 hour  Intake 120 ml  Output 1400 ml  Net -1280 ml   Weight change:   Physical Exam: Gen: older woman, sitting up in chair, NAD CVS: RRR Resp: normal WOB, on [place] O2, faint bibasilar crackles Abd: soft, obese Ext: 2+ bilateral LE edema ACCESS: + AVF +b/T  Imaging: No results found.  Labs: BMET Recent Labs  Lab 06/05/20 0852 06/06/20 0900 06/07/20 1019 06/08/20 0558 06/09/20 0600  NA 143 142 142  143 142  143 141  K 4.8 4.2 3.8  3.8 3.6  3.6 3.4*  CL 111 110 105  105 104  105 101  CO2 21* 22 24  24 27  27 30   GLUCOSE 169* 147* 182*  184* 165*  166* 164*  BUN 94* 85* 82*  81* 84*  83* 80*  CREATININE 4.05* 3.68* 3.69*  3.68* 3.72*  3.62* 3.36*  CALCIUM 8.6* 8.8* 9.0  9.1 8.8*  8.8* 8.9  PHOS  --  6.3* 5.0*  4.9* 5.0*  --    CBC Recent Labs  Lab 06/06/20 0900 06/07/20 1019 06/08/20 0558 06/09/20 0600  WBC 7.2 7.5 7.6  7.2  NEUTROABS  --  5.0 4.3 3.8  HGB 8.2* 8.6* 8.0* 8.3*  HCT 27.7* 28.7* 26.7* 27.7*  MCV 95.8 96.0 94.7 95.2  PLT 423* 423* 385 375    Medications:    . carvedilol  6.25 mg Oral BID  . cholecalciferol  2,000 Units Oral Daily  . furosemide  120 mg Oral BID  . heparin  5,000 Units Subcutaneous Q8H  . insulin aspart  0-5 Units Subcutaneous QHS  . insulin aspart  0-9 Units Subcutaneous TID WC  . isosorbide mononitrate  30 mg Oral Daily  . pravastatin  10 mg Oral QHS  . vitamin B-12  1,000 mcg Oral Daily      Rexene Agent , MD 06/09/2020, 9:09 AM

## 2020-06-11 DIAGNOSIS — I509 Heart failure, unspecified: Secondary | ICD-10-CM | POA: Diagnosis not present

## 2020-06-14 DIAGNOSIS — I10 Essential (primary) hypertension: Secondary | ICD-10-CM | POA: Diagnosis not present

## 2020-06-14 DIAGNOSIS — I272 Pulmonary hypertension, unspecified: Secondary | ICD-10-CM | POA: Diagnosis not present

## 2020-06-14 DIAGNOSIS — E1122 Type 2 diabetes mellitus with diabetic chronic kidney disease: Secondary | ICD-10-CM | POA: Diagnosis not present

## 2020-06-14 DIAGNOSIS — I0981 Rheumatic heart failure: Secondary | ICD-10-CM | POA: Diagnosis not present

## 2020-06-14 DIAGNOSIS — N185 Chronic kidney disease, stage 5: Secondary | ICD-10-CM | POA: Diagnosis not present

## 2020-06-14 DIAGNOSIS — D631 Anemia in chronic kidney disease: Secondary | ICD-10-CM | POA: Diagnosis not present

## 2020-06-14 DIAGNOSIS — I5022 Chronic systolic (congestive) heart failure: Secondary | ICD-10-CM | POA: Diagnosis not present

## 2020-06-14 DIAGNOSIS — I051 Rheumatic mitral insufficiency: Secondary | ICD-10-CM | POA: Diagnosis not present

## 2020-06-14 DIAGNOSIS — I132 Hypertensive heart and chronic kidney disease with heart failure and with stage 5 chronic kidney disease, or end stage renal disease: Secondary | ICD-10-CM | POA: Diagnosis not present

## 2020-06-14 DIAGNOSIS — I5033 Acute on chronic diastolic (congestive) heart failure: Secondary | ICD-10-CM | POA: Diagnosis not present

## 2020-06-15 DIAGNOSIS — N184 Chronic kidney disease, stage 4 (severe): Secondary | ICD-10-CM | POA: Diagnosis not present

## 2020-06-15 DIAGNOSIS — E1165 Type 2 diabetes mellitus with hyperglycemia: Secondary | ICD-10-CM | POA: Diagnosis not present

## 2020-06-15 DIAGNOSIS — E7800 Pure hypercholesterolemia, unspecified: Secondary | ICD-10-CM | POA: Diagnosis not present

## 2020-06-15 DIAGNOSIS — E1121 Type 2 diabetes mellitus with diabetic nephropathy: Secondary | ICD-10-CM | POA: Diagnosis not present

## 2020-06-15 DIAGNOSIS — I2699 Other pulmonary embolism without acute cor pulmonale: Secondary | ICD-10-CM | POA: Diagnosis not present

## 2020-06-15 DIAGNOSIS — I10 Essential (primary) hypertension: Secondary | ICD-10-CM | POA: Diagnosis not present

## 2020-06-15 DIAGNOSIS — E782 Mixed hyperlipidemia: Secondary | ICD-10-CM | POA: Diagnosis not present

## 2020-06-15 NOTE — Telephone Encounter (Signed)
Noted. Has since been hospitalized again.  H/H has been stable in 8-8.5 range.  Keep ov as planned.

## 2020-06-16 DIAGNOSIS — Z23 Encounter for immunization: Secondary | ICD-10-CM | POA: Diagnosis not present

## 2020-06-17 DIAGNOSIS — I0981 Rheumatic heart failure: Secondary | ICD-10-CM | POA: Diagnosis not present

## 2020-06-17 DIAGNOSIS — I5033 Acute on chronic diastolic (congestive) heart failure: Secondary | ICD-10-CM | POA: Diagnosis not present

## 2020-06-17 DIAGNOSIS — I5022 Chronic systolic (congestive) heart failure: Secondary | ICD-10-CM | POA: Diagnosis not present

## 2020-06-17 DIAGNOSIS — I272 Pulmonary hypertension, unspecified: Secondary | ICD-10-CM | POA: Diagnosis not present

## 2020-06-17 DIAGNOSIS — I051 Rheumatic mitral insufficiency: Secondary | ICD-10-CM | POA: Diagnosis not present

## 2020-06-17 DIAGNOSIS — N185 Chronic kidney disease, stage 5: Secondary | ICD-10-CM | POA: Diagnosis not present

## 2020-06-17 DIAGNOSIS — D631 Anemia in chronic kidney disease: Secondary | ICD-10-CM | POA: Diagnosis not present

## 2020-06-17 DIAGNOSIS — I132 Hypertensive heart and chronic kidney disease with heart failure and with stage 5 chronic kidney disease, or end stage renal disease: Secondary | ICD-10-CM | POA: Diagnosis not present

## 2020-06-17 DIAGNOSIS — E1122 Type 2 diabetes mellitus with diabetic chronic kidney disease: Secondary | ICD-10-CM | POA: Diagnosis not present

## 2020-06-21 DIAGNOSIS — N184 Chronic kidney disease, stage 4 (severe): Secondary | ICD-10-CM | POA: Diagnosis not present

## 2020-06-21 DIAGNOSIS — N2581 Secondary hyperparathyroidism of renal origin: Secondary | ICD-10-CM | POA: Diagnosis not present

## 2020-06-21 DIAGNOSIS — E1122 Type 2 diabetes mellitus with diabetic chronic kidney disease: Secondary | ICD-10-CM | POA: Diagnosis not present

## 2020-06-21 DIAGNOSIS — I129 Hypertensive chronic kidney disease with stage 1 through stage 4 chronic kidney disease, or unspecified chronic kidney disease: Secondary | ICD-10-CM | POA: Diagnosis not present

## 2020-06-21 DIAGNOSIS — I770 Arteriovenous fistula, acquired: Secondary | ICD-10-CM | POA: Diagnosis not present

## 2020-06-21 DIAGNOSIS — D631 Anemia in chronic kidney disease: Secondary | ICD-10-CM | POA: Diagnosis not present

## 2020-06-21 DIAGNOSIS — I509 Heart failure, unspecified: Secondary | ICD-10-CM | POA: Diagnosis not present

## 2020-06-22 ENCOUNTER — Ambulatory Visit (INDEPENDENT_AMBULATORY_CARE_PROVIDER_SITE_OTHER): Payer: Medicare HMO | Admitting: Emergency Medicine

## 2020-06-22 DIAGNOSIS — I5033 Acute on chronic diastolic (congestive) heart failure: Secondary | ICD-10-CM | POA: Diagnosis not present

## 2020-06-22 DIAGNOSIS — E1122 Type 2 diabetes mellitus with diabetic chronic kidney disease: Secondary | ICD-10-CM | POA: Diagnosis not present

## 2020-06-22 DIAGNOSIS — I442 Atrioventricular block, complete: Secondary | ICD-10-CM

## 2020-06-22 DIAGNOSIS — I132 Hypertensive heart and chronic kidney disease with heart failure and with stage 5 chronic kidney disease, or end stage renal disease: Secondary | ICD-10-CM | POA: Diagnosis not present

## 2020-06-22 DIAGNOSIS — I051 Rheumatic mitral insufficiency: Secondary | ICD-10-CM | POA: Diagnosis not present

## 2020-06-22 DIAGNOSIS — I5022 Chronic systolic (congestive) heart failure: Secondary | ICD-10-CM | POA: Diagnosis not present

## 2020-06-22 DIAGNOSIS — I272 Pulmonary hypertension, unspecified: Secondary | ICD-10-CM | POA: Diagnosis not present

## 2020-06-22 DIAGNOSIS — D631 Anemia in chronic kidney disease: Secondary | ICD-10-CM | POA: Diagnosis not present

## 2020-06-22 DIAGNOSIS — N185 Chronic kidney disease, stage 5: Secondary | ICD-10-CM | POA: Diagnosis not present

## 2020-06-22 DIAGNOSIS — I0981 Rheumatic heart failure: Secondary | ICD-10-CM | POA: Diagnosis not present

## 2020-06-22 LAB — CUP PACEART REMOTE DEVICE CHECK
Battery Remaining Longevity: 22 mo
Battery Remaining Percentage: 28 %
Battery Voltage: 2.81 V
Brady Statistic AP VP Percent: 1.5 %
Brady Statistic AP VS Percent: 1 %
Brady Statistic AS VP Percent: 98 %
Brady Statistic AS VS Percent: 1 %
Brady Statistic RA Percent Paced: 1.4 %
Date Time Interrogation Session: 20210928075808
Implantable Lead Implant Date: 20150121
Implantable Lead Implant Date: 20150121
Implantable Lead Implant Date: 20150121
Implantable Lead Location: 753858
Implantable Lead Location: 753859
Implantable Lead Location: 753860
Implantable Pulse Generator Implant Date: 20150121
Lead Channel Impedance Value: 400 Ohm
Lead Channel Impedance Value: 400 Ohm
Lead Channel Impedance Value: 450 Ohm
Lead Channel Pacing Threshold Amplitude: 0.75 V
Lead Channel Pacing Threshold Amplitude: 0.75 V
Lead Channel Pacing Threshold Amplitude: 1.25 V
Lead Channel Pacing Threshold Pulse Width: 0.5 ms
Lead Channel Pacing Threshold Pulse Width: 0.5 ms
Lead Channel Pacing Threshold Pulse Width: 0.6 ms
Lead Channel Sensing Intrinsic Amplitude: 12 mV
Lead Channel Sensing Intrinsic Amplitude: 3.8 mV
Lead Channel Setting Pacing Amplitude: 2 V
Lead Channel Setting Pacing Amplitude: 2.25 V
Lead Channel Setting Pacing Amplitude: 2.5 V
Lead Channel Setting Pacing Pulse Width: 0.5 ms
Lead Channel Setting Pacing Pulse Width: 0.6 ms
Lead Channel Setting Sensing Sensitivity: 2 mV
Pulse Gen Model: 3242
Pulse Gen Serial Number: 7557114

## 2020-06-23 DIAGNOSIS — I132 Hypertensive heart and chronic kidney disease with heart failure and with stage 5 chronic kidney disease, or end stage renal disease: Secondary | ICD-10-CM | POA: Diagnosis not present

## 2020-06-23 DIAGNOSIS — D631 Anemia in chronic kidney disease: Secondary | ICD-10-CM | POA: Diagnosis not present

## 2020-06-23 DIAGNOSIS — I272 Pulmonary hypertension, unspecified: Secondary | ICD-10-CM | POA: Diagnosis not present

## 2020-06-23 DIAGNOSIS — I5022 Chronic systolic (congestive) heart failure: Secondary | ICD-10-CM | POA: Diagnosis not present

## 2020-06-23 DIAGNOSIS — I0981 Rheumatic heart failure: Secondary | ICD-10-CM | POA: Diagnosis not present

## 2020-06-23 DIAGNOSIS — N185 Chronic kidney disease, stage 5: Secondary | ICD-10-CM | POA: Diagnosis not present

## 2020-06-23 DIAGNOSIS — I5033 Acute on chronic diastolic (congestive) heart failure: Secondary | ICD-10-CM | POA: Diagnosis not present

## 2020-06-23 DIAGNOSIS — E1122 Type 2 diabetes mellitus with diabetic chronic kidney disease: Secondary | ICD-10-CM | POA: Diagnosis not present

## 2020-06-23 DIAGNOSIS — I051 Rheumatic mitral insufficiency: Secondary | ICD-10-CM | POA: Diagnosis not present

## 2020-06-24 ENCOUNTER — Other Ambulatory Visit: Payer: Self-pay

## 2020-06-24 ENCOUNTER — Ambulatory Visit (HOSPITAL_COMMUNITY)
Admission: RE | Admit: 2020-06-24 | Discharge: 2020-06-24 | Disposition: A | Discharge status: Home / Self Care | Payer: Medicare HMO | Source: Ambulatory Visit | Attending: Vascular Surgery | Admitting: Vascular Surgery

## 2020-06-24 ENCOUNTER — Ambulatory Visit (INDEPENDENT_AMBULATORY_CARE_PROVIDER_SITE_OTHER): Payer: Self-pay | Admitting: Physician Assistant

## 2020-06-24 VITALS — BP 156/72 | HR 84 | Temp 98.2°F | Resp 20 | Ht 64.0 in | Wt 217.3 lb

## 2020-06-24 DIAGNOSIS — I129 Hypertensive chronic kidney disease with stage 1 through stage 4 chronic kidney disease, or unspecified chronic kidney disease: Secondary | ICD-10-CM | POA: Diagnosis not present

## 2020-06-24 DIAGNOSIS — E7849 Other hyperlipidemia: Secondary | ICD-10-CM | POA: Diagnosis not present

## 2020-06-24 DIAGNOSIS — N184 Chronic kidney disease, stage 4 (severe): Secondary | ICD-10-CM

## 2020-06-24 DIAGNOSIS — E1165 Type 2 diabetes mellitus with hyperglycemia: Secondary | ICD-10-CM | POA: Diagnosis not present

## 2020-06-24 NOTE — Progress Notes (Signed)
Remote pacemaker transmission.   

## 2020-06-24 NOTE — Progress Notes (Signed)
POST OPERATIVE OFFICE NOTE    CC:  F/u for surgery  HPI:  This is a 79 y.o. female who is s/p right BC AVF on 05/18/2020 by Dr. Oneida Alar. She has hx of right mastectomy with PPM on the left side.  She has hx of presumed PE in March and currently on Eliquis.  Her DVT study was negative.  She could not have CTA due to CKD4.    She states that she is doing well.  She denies any pain, numbness or tingling in her right hand.  Her nephrologist is Dr. Joni Fears.  She states she just had a visit with him and there is no immediate need for dialysis at this time.     No Known Allergies  Current Outpatient Medications  Medication Sig Dispense Refill  . acetaminophen (TYLENOL) 325 MG tablet Take 2 tablets (650 mg total) by mouth every 6 (six) hours as needed for mild pain or headache (or Fever >/= 101). 30 tablet 2  . albuterol (PROVENTIL) (2.5 MG/3ML) 0.083% nebulizer solution Take 3 mLs (2.5 mg total) by nebulization every 2 (two) hours as needed for wheezing or shortness of breath. 75 mL 12  . carvedilol (COREG) 6.25 MG tablet Take 1 tablet (6.25 mg total) by mouth 2 (two) times daily. 60 tablet 5  . Cholecalciferol (VITAMIN D) 2000 UNITS tablet Take 2,000 Units by mouth daily.     . furosemide (LASIX) 40 MG tablet Take 3 tablets (120 mg total) by mouth 2 (two) times daily. 180 tablet 0  . glipiZIDE (GLUCOTROL XL) 5 MG 24 hr tablet Take 5 mg by mouth See admin instructions. Take 10 mg in the morning and 5 mg at night    . guaiFENesin (MUCINEX) 600 MG 12 hr tablet Take 1 tablet (600 mg total) by mouth 2 (two) times daily. 20 tablet 0  . isosorbide mononitrate (IMDUR) 30 MG 24 hr tablet Take 1 tablet (30 mg total) by mouth daily. 30 tablet 11  . pantoprazole (PROTONIX) 40 MG tablet Take 1 tablet (40 mg total) by mouth daily. 30 tablet 3  . potassium chloride (KLOR-CON) 20 MEQ tablet Take 0.5 tablets (10 mEq total) by mouth daily. 15 tablet 0  . pravastatin (PRAVACHOL) 10 MG tablet Take 10 mg by mouth at  bedtime.     . vitamin B-12 (CYANOCOBALAMIN) 1000 MCG tablet Take 1,000 mcg by mouth daily.     No current facility-administered medications for this visit.     ROS:  See HPI  Physical Exam:  Today's Vitals   06/24/20 1117  BP: (!) 156/72  Pulse: 84  Resp: 20  Temp: 98.2 F (36.8 C)  TempSrc: Temporal  SpO2: 96%  Weight: 217 lb 4.8 oz (98.6 kg)  Height: 5\' 4"  (1.626 m)   Body mass index is 37.3 kg/m.   Incision:  Healed nicely Extremities:   There is not a palpable right radial or ulnar pulse.   Motor and sensory are in tact with strong hand grip bilaterally There is an excellent thrill/bruit present.    Dialysis Duplex on 06/24/2020: Diameter:  0.55cm-0.95cm Depth:  0.32cm-2.45cm   Assessment/Plan:  This is a 79 y.o. female who is s/p: Right BC AVF by Dr. Oneida Alar on 05/18/2020   -the pt does not have palpable right radial/ulnar pulses, however she does not have evidence of steal and has equal hand grips 5/5 bilaterally and motor and sensation are in tact.  -discussed with pt that her fistula has matured nicely, however,  it is too deep to access.  I discussed superficialization of the fistula with pt.  She would like to discuss with her daughter about when she is available to do this and she will call to schedule and set up time that is convenient for her daughter and when Dr. Oneida Alar has availability.    Cassandra Parker, [facility] [phone]  Clinic MD:  Oneida Alar

## 2020-06-24 NOTE — H&P (View-Only) (Signed)
POST OPERATIVE OFFICE NOTE    CC:  F/u for surgery  HPI:  This is a 79 y.o. female who is s/p right BC AVF on 05/18/2020 by Dr. Oneida Alar. She has hx of right mastectomy with PPM on the left side.  She has hx of presumed PE in March and currently on Eliquis.  Her DVT study was negative.  She could not have CTA due to CKD4.    She states that she is doing well.  She denies any pain, numbness or tingling in her right hand.  Her nephrologist is Dr. Joni Fears.  She states she just had a visit with him and there is no immediate need for dialysis at this time.     No Known Allergies  Current Outpatient Medications  Medication Sig Dispense Refill  . acetaminophen (TYLENOL) 325 MG tablet Take 2 tablets (650 mg total) by mouth every 6 (six) hours as needed for mild pain or headache (or Fever >/= 101). 30 tablet 2  . albuterol (PROVENTIL) (2.5 MG/3ML) 0.083% nebulizer solution Take 3 mLs (2.5 mg total) by nebulization every 2 (two) hours as needed for wheezing or shortness of breath. 75 mL 12  . carvedilol (COREG) 6.25 MG tablet Take 1 tablet (6.25 mg total) by mouth 2 (two) times daily. 60 tablet 5  . Cholecalciferol (VITAMIN D) 2000 UNITS tablet Take 2,000 Units by mouth daily.     . furosemide (LASIX) 40 MG tablet Take 3 tablets (120 mg total) by mouth 2 (two) times daily. 180 tablet 0  . glipiZIDE (GLUCOTROL XL) 5 MG 24 hr tablet Take 5 mg by mouth See admin instructions. Take 10 mg in the morning and 5 mg at night    . guaiFENesin (MUCINEX) 600 MG 12 hr tablet Take 1 tablet (600 mg total) by mouth 2 (two) times daily. 20 tablet 0  . isosorbide mononitrate (IMDUR) 30 MG 24 hr tablet Take 1 tablet (30 mg total) by mouth daily. 30 tablet 11  . pantoprazole (PROTONIX) 40 MG tablet Take 1 tablet (40 mg total) by mouth daily. 30 tablet 3  . potassium chloride (KLOR-CON) 20 MEQ tablet Take 0.5 tablets (10 mEq total) by mouth daily. 15 tablet 0  . pravastatin (PRAVACHOL) 10 MG tablet Take 10 mg by mouth at  bedtime.     . vitamin B-12 (CYANOCOBALAMIN) 1000 MCG tablet Take 1,000 mcg by mouth daily.     No current facility-administered medications for this visit.     ROS:  See HPI  Physical Exam:  Today's Vitals   06/24/20 1117  BP: (!) 156/72  Pulse: 84  Resp: 20  Temp: 98.2 F (36.8 C)  TempSrc: Temporal  SpO2: 96%  Weight: 217 lb 4.8 oz (98.6 kg)  Height: 5\' 4"  (1.626 m)   Body mass index is 37.3 kg/m.   Incision:  Healed nicely Extremities:   There is not a palpable right radial or ulnar pulse.   Motor and sensory are in tact with strong hand grip bilaterally There is an excellent thrill/bruit present.    Dialysis Duplex on 06/24/2020: Diameter:  0.55cm-0.95cm Depth:  0.32cm-2.45cm   Assessment/Plan:  This is a 79 y.o. female who is s/p: Right BC AVF by Dr. Oneida Alar on 05/18/2020   -the pt does not have palpable right radial/ulnar pulses, however she does not have evidence of steal and has equal hand grips 5/5 bilaterally and motor and sensation are in tact.  -discussed with pt that her fistula has matured nicely, however,  it is too deep to access.  I discussed superficialization of the fistula with pt.  She would like to discuss with her daughter about when she is available to do this and she will call to schedule and set up time that is convenient for her daughter and when Dr. Oneida Alar has availability.    [name], [facility] [phone]  Clinic MD:  Oneida Alar

## 2020-06-25 ENCOUNTER — Encounter: Payer: Self-pay | Admitting: Internal Medicine

## 2020-06-25 DIAGNOSIS — I132 Hypertensive heart and chronic kidney disease with heart failure and with stage 5 chronic kidney disease, or end stage renal disease: Secondary | ICD-10-CM | POA: Diagnosis not present

## 2020-06-25 DIAGNOSIS — D631 Anemia in chronic kidney disease: Secondary | ICD-10-CM | POA: Diagnosis not present

## 2020-06-25 DIAGNOSIS — I5022 Chronic systolic (congestive) heart failure: Secondary | ICD-10-CM | POA: Diagnosis not present

## 2020-06-25 DIAGNOSIS — I5033 Acute on chronic diastolic (congestive) heart failure: Secondary | ICD-10-CM | POA: Diagnosis not present

## 2020-06-25 DIAGNOSIS — I272 Pulmonary hypertension, unspecified: Secondary | ICD-10-CM | POA: Diagnosis not present

## 2020-06-25 DIAGNOSIS — N185 Chronic kidney disease, stage 5: Secondary | ICD-10-CM | POA: Diagnosis not present

## 2020-06-25 DIAGNOSIS — I051 Rheumatic mitral insufficiency: Secondary | ICD-10-CM | POA: Diagnosis not present

## 2020-06-25 DIAGNOSIS — I0981 Rheumatic heart failure: Secondary | ICD-10-CM | POA: Diagnosis not present

## 2020-06-25 DIAGNOSIS — E1122 Type 2 diabetes mellitus with diabetic chronic kidney disease: Secondary | ICD-10-CM | POA: Diagnosis not present

## 2020-06-25 NOTE — Progress Notes (Unsigned)
PERIOPERATIVE PRESCRIPTION FOR IMPLANTED CARDIAC DEVICE PROGRAMMING  Patient Information: Name:  Cassandra Parker  DOB:  [birth year]  MRN:  [id number]  {TIP - You do not have to delete this tip  -  Copy the info from the staff message sent by the PAT staff  then press F2 here and paste the information using CTL - V on the next line :[id number]}  Planned Procedure: Superficialization right arm fistula  Surgeon: Dr. Ruta Hinds  Date of Procedure: 06/28/20   Cautery will be used.  Position during surgery: Supine   Please send documentation back to:  [facility] (Fax # [phone])   Device Information:  Clinic EP Physician:  Cristopher Peru, MD   Device Type:  Pacemaker Manufacturer and Phone #:  [employer]: [phone] Pacemaker Dependent?:  Yes.   Date of Last Device Check:  06/22/20 Normal Device Function?:  Yes.    Electrophysiologist's Recommendations:   Have magnet available.  Provide continuous ECG monitoring when magnet is used or reprogramming is to be performed.   Procedure may interfere with device function.  Magnet should be placed over device during procedure.  Per Device Clinic Standing Orders, Simone Curia, RN  5:12 PM 06/25/2020

## 2020-06-25 NOTE — Progress Notes (Signed)
Anesthesia Chart Review: Same day workup  Follows with cardiology for history of chronic systolic heart failure, nonischemic cardiomyopathy, LBBB, status post BiV PPM insertion with normalization of LV function, HLD, HTN, PE (March 2021). Last seen by cardiology 05/14/2020 and discussed upcoming surgery (fistula surgery on 05/18/20).  Per note, "She has a pending surgery August 17 with Dr. 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 Biventricular pacemaker recently saw Dr. Lovena Le. Device is working normally and she had 2.6 years of battery life."  Recently s/p right BC AVF on 05/18/2020 by Dr. Oneida Alar.  She is not yet requiring dialysis  In the interim pt was admitted 05/22/2020 - 05/28/2020 due to sepsis from pneumonia complicated with AKI, possible GI bleeding. She was discharged on 3L supplemental O2. Readmitted 9/11-9/15/21 for acute on chronic diastolic CHF.   At followup with vascular surgery 06/24/20 she was noted to be doing well.   She will need DOS labs and eval.  EKG 06/05/20: Atrial-sensed ventricular-paced rhythm. Rate 74.  TTE 05/23/20: 1. Left ventricular ejection fraction, by estimation, is 60 to 65%. The  left ventricle has normal function. The left ventricle has no regional  wall motion abnormalities. There is moderate left ventricular hypertrophy.  Left ventricular diastolic  parameters are consistent with Grade I diastolic dysfunction (impaired  relaxation).  2. Right ventricular systolic function is normal. The right ventricular  size is normal. There is severely elevated pulmonary artery systolic  pressure. The estimated right ventricular systolic pressure is 11.9 mmHg.  Device wire present.  3. Left atrial size was mildly dilated.  4. The mitral valve is abnormal, mildly calcified and with moderate  annular calcification. Mild mitral valve regurgitation.  5. The aortic valve is tricuspid. Aortic valve regurgitation is not   visualized.  6. The inferior vena cava is dilated in size with <50% respiratory  variability, suggesting right atrial pressure of 15 mmHg.    Wynonia Musty [facility] Short Stay Center/Anesthesiology Phone [phone] 06/25/2020 3:08 PM

## 2020-06-25 NOTE — Anesthesia Preprocedure Evaluation (Addendum)
Anesthesia Evaluation  Patient identified by MRN, date of birth, ID band Patient awake    Reviewed: Allergy & Precautions, NPO status , Patient's Chart, lab work & pertinent test results  History of Anesthesia Complications Negative for: history of anesthetic complications  Airway Mallampati: II  TM Distance: >3 FB Neck ROM: Full    Dental  (+) Dental Advisory Given, Upper Dentures   Pulmonary PE   Pulmonary exam normal        Cardiovascular hypertension, Pt. on medications and Pt. on home beta blockers +CHF  Normal cardiovascular exam+ dysrhythmias + pacemaker + Valvular Problems/Murmurs    '21 TTE - EF 60 to 65%. Moderate left ventricular hypertrophy. Grade I diastolic dysfunction (impaired  relaxation). Severely elevated pulmonary artery systolic  pressure. The estimated right ventricular systolic pressure is 44.8 mmHg. Left atrial size was mildly dilated.  Mild mitral valve regurgitation.   Electrophysiologist's Recommendations: Have magnet available. Provide continuous ECG monitoring when magnet is used or reprogramming is to be performed.  Procedure may interfere with device function.  Magnet should be placed over device during procedure.    Neuro/Psych negative neurological ROS  negative psych ROS   GI/Hepatic negative GI ROS, Neg liver ROS,   Endo/Other  diabetes, Type 2, Oral Hypoglycemic Agents Obesity   Renal/GU ESRFRenal disease     Musculoskeletal  (+) Arthritis ,   Abdominal   Peds  Hematology negative hematology ROS (+)   Anesthesia Other Findings Covid test negative See PAT note  Reproductive/Obstetrics                                                             Anesthesia Evaluation  Patient identified by MRN, date of birth, ID band Patient awake    Reviewed: Allergy & Precautions, NPO status , Patient's Chart, lab work & pertinent test  results  Airway Mallampati: III  TM Distance: >3 FB Neck ROM: Full    Dental  (+) Edentulous Upper, Partial Lower, Dental Advisory Given, Missing   Pulmonary pneumonia,    Pulmonary exam normal breath sounds clear to auscultation       Cardiovascular hypertension, Pt. on medications and Pt. on home beta blockers +CHF  Normal cardiovascular exam+ dysrhythmias + pacemaker  Rhythm:Regular Rate:Normal  Echo  1. Left ventricular ejection fraction, by estimation, is 60 to 65%. The left ventricle has normal function. The left ventricle has no regional wall motion abnormalities. There is mild concentric left ventricular hypertrophy. Left ventricular diastolic parameters are consistent with Grade I diastolic dysfunction (impaired relaxation). Elevated left ventricular end-diastolic pressure.  2. Right ventricular systolic function is normal. The right ventricular size is mildly enlarged. There is mildly elevated pulmonary artery systolic pressure.  3. Left atrial size was severely dilated.  4. Right atrial size was mildly dilated.  5. The mitral valve is degenerative. Trivial mitral valve regurgitation.  6. The aortic valve is tricuspid. Aortic valve regurgitation is not visualized. No aortic stenosis is present.  7. The inferior vena cava is normal in size with greater than 50% respiratory variability, suggesting right atrial pressure of 3 mmHg.    Neuro/Psych negative neurological ROS     GI/Hepatic negative GI ROS, Neg liver ROS,   Endo/Other  diabetes  Renal/GU Renal disease     Musculoskeletal  (+)  Arthritis ,   Abdominal (+) + obese,   Peds  Hematology negative hematology ROS (+)   Anesthesia Other Findings   Reproductive/Obstetrics                                                             Anesthesia Evaluation  Patient identified by MRN, date of birth, ID band Patient awake    Reviewed: Allergy & Precautions, NPO status  , Patient's Chart, lab work & pertinent test results, reviewed documented beta blocker date and time   Airway Mallampati: II  TM Distance: >3 FB Neck ROM: Full    Dental   Pulmonary neg pulmonary ROS,    breath sounds clear to auscultation       Cardiovascular hypertension, Pt. on medications and Pt. on home beta blockers + dysrhythmias + pacemaker + Valvular Problems/Murmurs (Mild MS)  Rhythm:Regular Rate:Normal     Neuro/Psych negative neurological ROS     GI/Hepatic negative GI ROS, Neg liver ROS,   Endo/Other  diabetes, Type 2, Oral Hypoglycemic AgentsMorbid obesity  Renal/GU CRFRenal disease     Musculoskeletal  (+) Arthritis ,   Abdominal   Peds  Hematology  (+) anemia ,   Anesthesia Other Findings   Reproductive/Obstetrics                            Lab Results  Component Value Date   WBC 7.2 06/09/2020   HGB 8.3 (L) 06/09/2020   HCT 27.7 (L) 06/09/2020   MCV 95.2 06/09/2020   PLT 375 06/09/2020   Lab Results  Component Value Date   CREATININE 3.36 (H) 06/09/2020   BUN 80 (H) 06/09/2020   NA 141 06/09/2020   K 3.4 (L) 06/09/2020   CL 101 06/09/2020   CO2 30 06/09/2020   Lab Results  Component Value Date   INR 1.9 (H) 05/22/2020   INR 1.02 10/29/2015   INR 0.97 10/13/2013    Anesthesia Physical Anesthesia Plan  ASA: III  Anesthesia Plan: General and Regional   Post-op Pain Management: GA combined w/ Regional for post-op pain   Induction: Intravenous  Airway Management Planned: Oral ETT  Additional Equipment:   Intra-op Plan:   Post-operative Plan: Extubation in OR  Informed Consent: I have reviewed the patients History and Physical, chart, labs and discussed the procedure including the risks, benefits and alternatives for the proposed anesthesia with the patient or authorized representative who has indicated his/her understanding and acceptance.   Dental advisory given  Plan Discussed with:  CRNA  Anesthesia Plan Comments:        Anesthesia Quick Evaluation  Anesthesia Physical Anesthesia Plan  ASA: IV  Anesthesia Plan: MAC   Post-op Pain Management:    Induction: Intravenous  PONV Risk Score and Plan:   Airway Management Planned: Natural Airway  Additional Equipment: None  Intra-op Plan:   Post-operative Plan:   Informed Consent: I have reviewed the patients History and Physical, chart, labs and discussed the procedure including the risks, benefits and alternatives for the proposed anesthesia with the patient or authorized representative who has indicated his/her understanding and acceptance.     Dental advisory given  Plan Discussed with: CRNA  Anesthesia Plan Comments: (PAT note by Karoline Caldwell, PA-C: Follows  with cardiology for history of chronic systolic heart failure, nonischemic cardiomyopathy, LBBB, status post BiV PPM insertion with normalization of LV function, HLD, HTN. Recent echocardiogram on 12/23/2019 showed LVEF of 60 to 65%.  No WMA's, mild LVH, G1 DD, mild elevated PASP, LA severely dilated, RA mildly dilated, trivial MR.   Last seen by cardiology 05/14/2020 and discussed upcoming surgery.  Per note, "She has a pending surgery August 17 with Dr. 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  Biventricular pacemaker recently saw Dr. Lovena Le.  Device is working normally and she had 2.6 years of battery life."  Per patient, last dose Eliquis 05/14/2020.  Periop device form has been sent to EP cardiology.  Recent indeterminate CT suspicion for PE.  She is on Eliquis p.o. twice daily.  CKD stage 4/5, pending HD vascular access.  Will need day of surgery labs and eval.  EKG 12/21/19: Sinus rhythm. Rate 90. Borderline short PR interval. Probable left atrial enlargement. IVCD, consider atypical RBBB. LVH with secondary repolarization abnormality. Anterior Q waves, possibly due to LVH. Baseline wander in lead(s)  V6  TTE 12/23/2019: 1. Left ventricular ejection fraction, by estimation, is 60 to 65%. The left ventricle has normal function. The left ventricle has no regional wall motion abnormalities. There is mild concentric left ventricular hypertrophy. Left ventricular diastolic parameters are consistent with Grade I diastolic dysfunction (impaired relaxation). Elevated left ventricular end-diastolic pressure.  2. Right ventricular systolic function is normal. The right ventricular size is mildly enlarged. There is mildly elevated pulmonary artery systolic pressure.  3. Left atrial size was severely dilated.  4. Right atrial size was mildly dilated.  5. The mitral valve is degenerative. Trivial mitral valve regurgitation.  6. The aortic valve is tricuspid. Aortic valve regurgitation is not visualized. No aortic stenosis is present.  7. The inferior vena cava is normal in size with greater than 50% respiratory variability, suggesting right atrial pressure of 3 mmHg.   )     Anesthesia Quick Evaluation                                   Anesthesia Evaluation  Patient identified by MRN, date of birth, ID band Patient awake    Reviewed: Allergy & Precautions, NPO status , Patient's Chart, lab work & pertinent test results  Airway Mallampati: III  TM Distance: >3 FB Neck ROM: Full    Dental  (+) Edentulous Upper, Partial Lower, Dental Advisory Given, Missing   Pulmonary pneumonia,    Pulmonary exam normal breath sounds clear to auscultation       Cardiovascular hypertension, Pt. on medications and Pt. on home beta blockers +CHF  Normal cardiovascular exam+ dysrhythmias + pacemaker  Rhythm:Regular Rate:Normal  Echo  1. Left ventricular ejection fraction, by estimation, is 60 to 65%. The left ventricle has normal function. The left ventricle has no regional wall motion abnormalities. There is mild concentric left ventricular hypertrophy. Left ventricular diastolic  parameters are consistent with Grade I diastolic dysfunction (impaired relaxation). Elevated left ventricular end-diastolic pressure.  2. Right ventricular systolic function is normal. The right ventricular size is mildly enlarged. There is mildly elevated pulmonary artery systolic pressure.  3. Left atrial size was severely dilated.  4. Right atrial size was mildly dilated.  5. The mitral valve is degenerative. Trivial mitral valve regurgitation.  6. The aortic valve is tricuspid. Aortic valve regurgitation is  not visualized. No aortic stenosis is present.  7. The inferior vena cava is normal in size with greater than 50% respiratory variability, suggesting right atrial pressure of 3 mmHg.    Neuro/Psych negative neurological ROS     GI/Hepatic negative GI ROS, Neg liver ROS,   Endo/Other  diabetes  Renal/GU Renal disease     Musculoskeletal  (+) Arthritis ,   Abdominal (+) + obese,   Peds  Hematology negative hematology ROS (+)   Anesthesia Other Findings   Reproductive/Obstetrics                                                             Anesthesia Evaluation  Patient identified by MRN, date of birth, ID band Patient awake    Reviewed: Allergy & Precautions, NPO status , Patient's Chart, lab work & pertinent test results, reviewed documented beta blocker date and time   Airway Mallampati: II  TM Distance: >3 FB Neck ROM: Full    Dental   Pulmonary neg pulmonary ROS,    breath sounds clear to auscultation       Cardiovascular hypertension, Pt. on medications and Pt. on home beta blockers + dysrhythmias + pacemaker + Valvular Problems/Murmurs (Mild MS)  Rhythm:Regular Rate:Normal     Neuro/Psych negative neurological ROS     GI/Hepatic negative GI ROS, Neg liver ROS,   Endo/Other  diabetes, Type 2, Oral Hypoglycemic AgentsMorbid obesity  Renal/GU CRFRenal disease     Musculoskeletal  (+) Arthritis ,    Abdominal   Peds  Hematology  (+) anemia ,   Anesthesia Other Findings   Reproductive/Obstetrics                            Lab Results  Component Value Date   WBC 7.2 06/09/2020   HGB 8.3 (L) 06/09/2020   HCT 27.7 (L) 06/09/2020   MCV 95.2 06/09/2020   PLT 375 06/09/2020   Lab Results  Component Value Date   CREATININE 3.36 (H) 06/09/2020   BUN 80 (H) 06/09/2020   NA 141 06/09/2020   K 3.4 (L) 06/09/2020   CL 101 06/09/2020   CO2 30 06/09/2020   Lab Results  Component Value Date   INR 1.9 (H) 05/22/2020   INR 1.02 10/29/2015   INR 0.97 10/13/2013    Anesthesia Physical Anesthesia Plan  ASA: III  Anesthesia Plan: General and Regional   Post-op Pain Management: GA combined w/ Regional for post-op pain   Induction: Intravenous  Airway Management Planned: Oral ETT  Additional Equipment:   Intra-op Plan:   Post-operative Plan: Extubation in OR  Informed Consent: I have reviewed the patients History and Physical, chart, labs and discussed the procedure including the risks, benefits and alternatives for the proposed anesthesia with the patient or authorized representative who has indicated his/her understanding and acceptance.   Dental advisory given  Plan Discussed with: CRNA  Anesthesia Plan Comments:        Anesthesia Quick Evaluation  Anesthesia Physical Anesthesia Plan  ASA: IV  Anesthesia Plan: MAC   Post-op Pain Management:    Induction: Intravenous  PONV Risk Score and Plan:   Airway Management Planned: Natural Airway  Additional Equipment: None  Intra-op Plan:   Post-operative Plan:  Informed Consent: I have reviewed the patients History and Physical, chart, labs and discussed the procedure including the risks, benefits and alternatives for the proposed anesthesia with the patient or authorized representative who has indicated his/her understanding and acceptance.     Dental advisory given  Plan  Discussed with: CRNA  Anesthesia Plan Comments: (PAT note by Karoline Caldwell, PA-C: Follows with cardiology for history of chronic systolic heart failure, nonischemic cardiomyopathy, LBBB, status post BiV PPM insertion with normalization of LV function, HLD, HTN. Recent echocardiogram on 12/23/2019 showed LVEF of 60 to 65%.  No WMA's, mild LVH, G1 DD, mild elevated PASP, LA severely dilated, RA mildly dilated, trivial MR.   Last seen by cardiology 05/14/2020 and discussed upcoming surgery.  Per note, "She has a pending surgery August 17 with Dr. 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  Biventricular pacemaker recently saw Dr. Lovena Le.  Device is working normally and she had 2.6 years of battery life."  Per patient, last dose Eliquis 05/14/2020.  Periop device form has been sent to EP cardiology.  Recent indeterminate CT suspicion for PE.  She is on Eliquis p.o. twice daily.  CKD stage 4/5, pending HD vascular access.  Will need day of surgery labs and eval.  EKG 12/21/19: Sinus rhythm. Rate 90. Borderline short PR interval. Probable left atrial enlargement. IVCD, consider atypical RBBB. LVH with secondary repolarization abnormality. Anterior Q waves, possibly due to LVH. Baseline wander in lead(s) V6  TTE 12/23/2019: 1. Left ventricular ejection fraction, by estimation, is 60 to 65%. The left ventricle has normal function. The left ventricle has no regional wall motion abnormalities. There is mild concentric left ventricular hypertrophy. Left ventricular diastolic parameters are consistent with Grade I diastolic dysfunction (impaired relaxation). Elevated left ventricular end-diastolic pressure.  2. Right ventricular systolic function is normal. The right ventricular size is mildly enlarged. There is mildly elevated pulmonary artery systolic pressure.  3. Left atrial size was severely dilated.  4. Right atrial size was mildly dilated.  5. The mitral valve is  degenerative. Trivial mitral valve regurgitation.  6. The aortic valve is tricuspid. Aortic valve regurgitation is not visualized. No aortic stenosis is present.  7. The inferior vena cava is normal in size with greater than 50% respiratory variability, suggesting right atrial pressure of 3 mmHg.   )     Anesthesia Quick Evaluation  Anesthesia Physical Anesthesia Plan  ASA: IV  Anesthesia Plan: MAC   Post-op Pain Management:    Induction: Intravenous  PONV Risk Score and Plan: 2 and Propofol infusion and Treatment may vary due to age or medical condition  Airway Management Planned: Natural Airway and Simple Face Mask  Additional Equipment: None  Intra-op Plan:   Post-operative Plan:   Informed Consent: I have reviewed the patients History and Physical, chart, labs and discussed the procedure including the risks, benefits and alternatives for the proposed anesthesia with the patient or authorized representative who has indicated his/her understanding and acceptance.       Plan Discussed with: CRNA and Anesthesiologist  Anesthesia Plan Comments:       Anesthesia Quick Evaluation

## 2020-06-25 NOTE — Progress Notes (Signed)
Device clinic request sent and [employer] notified of surgery.

## 2020-06-26 ENCOUNTER — Other Ambulatory Visit (HOSPITAL_COMMUNITY)
Admission: RE | Admit: 2020-06-26 | Discharge: 2020-06-26 | Disposition: A | Discharge status: Home / Self Care | Payer: Medicare HMO | Source: Ambulatory Visit | Attending: Vascular Surgery | Admitting: Vascular Surgery

## 2020-06-26 DIAGNOSIS — Z20822 Contact with and (suspected) exposure to covid-19: Secondary | ICD-10-CM | POA: Insufficient documentation

## 2020-06-26 DIAGNOSIS — Z01812 Encounter for preprocedural laboratory examination: Secondary | ICD-10-CM | POA: Insufficient documentation

## 2020-06-26 LAB — SARS CORONAVIRUS 2 (TAT 6-24 HRS): SARS Coronavirus 2: NEGATIVE

## 2020-06-27 DIAGNOSIS — J189 Pneumonia, unspecified organism: Secondary | ICD-10-CM | POA: Diagnosis not present

## 2020-06-27 DIAGNOSIS — A419 Sepsis, unspecified organism: Secondary | ICD-10-CM | POA: Diagnosis not present

## 2020-06-27 DIAGNOSIS — J9601 Acute respiratory failure with hypoxia: Secondary | ICD-10-CM | POA: Diagnosis not present

## 2020-06-27 DIAGNOSIS — I272 Pulmonary hypertension, unspecified: Secondary | ICD-10-CM | POA: Diagnosis not present

## 2020-06-27 DIAGNOSIS — K922 Gastrointestinal hemorrhage, unspecified: Secondary | ICD-10-CM | POA: Diagnosis not present

## 2020-06-28 ENCOUNTER — Ambulatory Visit (HOSPITAL_COMMUNITY)
Admission: RE | Admit: 2020-06-28 | Discharge: 2020-06-28 | Disposition: A | Discharge status: Home / Self Care | Payer: Medicare HMO | Attending: Vascular Surgery | Admitting: Vascular Surgery

## 2020-06-28 ENCOUNTER — Ambulatory Visit (HOSPITAL_COMMUNITY): Payer: Medicare HMO | Admitting: Vascular Surgery

## 2020-06-28 ENCOUNTER — Encounter (HOSPITAL_COMMUNITY): Payer: Self-pay | Admitting: Vascular Surgery

## 2020-06-28 ENCOUNTER — Encounter (HOSPITAL_COMMUNITY)
Admission: RE | Disposition: A | Discharge status: Home / Self Care | Payer: Self-pay | Source: Home / Self Care | Attending: Vascular Surgery

## 2020-06-28 ENCOUNTER — Other Ambulatory Visit: Payer: Self-pay

## 2020-06-28 DIAGNOSIS — E1122 Type 2 diabetes mellitus with diabetic chronic kidney disease: Secondary | ICD-10-CM | POA: Diagnosis not present

## 2020-06-28 DIAGNOSIS — N184 Chronic kidney disease, stage 4 (severe): Secondary | ICD-10-CM | POA: Insufficient documentation

## 2020-06-28 DIAGNOSIS — E785 Hyperlipidemia, unspecified: Secondary | ICD-10-CM | POA: Insufficient documentation

## 2020-06-28 DIAGNOSIS — Z950 Presence of cardiac pacemaker: Secondary | ICD-10-CM | POA: Diagnosis not present

## 2020-06-28 DIAGNOSIS — Z7984 Long term (current) use of oral hypoglycemic drugs: Secondary | ICD-10-CM | POA: Insufficient documentation

## 2020-06-28 DIAGNOSIS — I5022 Chronic systolic (congestive) heart failure: Secondary | ICD-10-CM | POA: Diagnosis not present

## 2020-06-28 DIAGNOSIS — I428 Other cardiomyopathies: Secondary | ICD-10-CM | POA: Insufficient documentation

## 2020-06-28 DIAGNOSIS — T82590A Other mechanical complication of surgically created arteriovenous fistula, initial encounter: Secondary | ICD-10-CM | POA: Diagnosis not present

## 2020-06-28 DIAGNOSIS — Z7901 Long term (current) use of anticoagulants: Secondary | ICD-10-CM | POA: Diagnosis not present

## 2020-06-28 DIAGNOSIS — Y832 Surgical operation with anastomosis, bypass or graft as the cause of abnormal reaction of the patient, or of later complication, without mention of misadventure at the time of the procedure: Secondary | ICD-10-CM | POA: Insufficient documentation

## 2020-06-28 DIAGNOSIS — I130 Hypertensive heart and chronic kidney disease with heart failure and stage 1 through stage 4 chronic kidney disease, or unspecified chronic kidney disease: Secondary | ICD-10-CM | POA: Diagnosis not present

## 2020-06-28 DIAGNOSIS — Z79899 Other long term (current) drug therapy: Secondary | ICD-10-CM | POA: Diagnosis not present

## 2020-06-28 DIAGNOSIS — I5033 Acute on chronic diastolic (congestive) heart failure: Secondary | ICD-10-CM | POA: Diagnosis not present

## 2020-06-28 DIAGNOSIS — T82898A Other specified complication of vascular prosthetic devices, implants and grafts, initial encounter: Secondary | ICD-10-CM | POA: Diagnosis not present

## 2020-06-28 DIAGNOSIS — N185 Chronic kidney disease, stage 5: Secondary | ICD-10-CM | POA: Diagnosis not present

## 2020-06-28 DIAGNOSIS — Z6838 Body mass index (BMI) 38.0-38.9, adult: Secondary | ICD-10-CM | POA: Diagnosis not present

## 2020-06-28 HISTORY — PX: FISTULA SUPERFICIALIZATION: SHX6341

## 2020-06-28 LAB — GLUCOSE, CAPILLARY
Glucose-Capillary: 292 mg/dL — ABNORMAL HIGH (ref 70–99)
Glucose-Capillary: 239 mg/dL — ABNORMAL HIGH (ref 70–99)
Glucose-Capillary: 185 mg/dL — ABNORMAL HIGH (ref 70–99)

## 2020-06-28 LAB — POCT I-STAT, CHEM 8
BUN: 103 mg/dL — ABNORMAL HIGH (ref 8–23)
Calcium, Ion: 1.03 mmol/L — ABNORMAL LOW (ref 1.15–1.40)
Chloride: 107 mmol/L (ref 98–111)
Creatinine, Ser: 4.1 mg/dL — ABNORMAL HIGH (ref 0.44–1.00)
Glucose, Bld: 296 mg/dL — ABNORMAL HIGH (ref 70–99)
HCT: 33 % — ABNORMAL LOW (ref 36.0–46.0)
Hemoglobin: 11.2 g/dL — ABNORMAL LOW (ref 12.0–15.0)
Potassium: 3.9 mmol/L (ref 3.5–5.1)
Sodium: 138 mmol/L (ref 135–145)
TCO2: 22 mmol/L (ref 22–32)

## 2020-06-28 SURGERY — FISTULA SUPERFICIALIZATION
Anesthesia: Monitor Anesthesia Care | Site: Arm Upper | Laterality: Right

## 2020-06-28 MED ORDER — CEFAZOLIN SODIUM-DEXTROSE 2-4 GM/100ML-% IV SOLN
INTRAVENOUS | Status: AC
  Filled 2020-06-28: qty 100

## 2020-06-28 MED ORDER — LIDOCAINE 2% (20 MG/ML) 5 ML SYRINGE
INTRAMUSCULAR | Status: DC | PRN
  Administered 2020-06-28: 60 mg via INTRAVENOUS

## 2020-06-28 MED ORDER — ONDANSETRON HCL 4 MG/2ML IJ SOLN
INTRAMUSCULAR | Status: AC
  Filled 2020-06-28: qty 2

## 2020-06-28 MED ORDER — LIDOCAINE HCL (PF) 1 % IJ SOLN
INTRAMUSCULAR | Status: AC
  Filled 2020-06-28: qty 60

## 2020-06-28 MED ORDER — SUCCINYLCHOLINE CHLORIDE 200 MG/10ML IV SOSY
PREFILLED_SYRINGE | INTRAVENOUS | Status: AC
  Filled 2020-06-28: qty 10

## 2020-06-28 MED ORDER — HYDROCODONE-ACETAMINOPHEN 5-325 MG PO TABS
1.0000 | ORAL_TABLET | Freq: Four times a day (QID) | ORAL | 0 refills | Status: AC | PRN
Start: 2020-06-28 — End: ?

## 2020-06-28 MED ORDER — DEXAMETHASONE SODIUM PHOSPHATE 10 MG/ML IJ SOLN
INTRAMUSCULAR | Status: AC
  Filled 2020-06-28: qty 2

## 2020-06-28 MED ORDER — LIDOCAINE 2% (20 MG/ML) 5 ML SYRINGE
INTRAMUSCULAR | Status: AC
  Filled 2020-06-28: qty 15

## 2020-06-28 MED ORDER — PROPOFOL 10 MG/ML IV BOLUS
INTRAVENOUS | Status: DC | PRN
  Administered 2020-06-28: 50 mg via INTRAVENOUS
  Administered 2020-06-28: 100 mg via INTRAVENOUS

## 2020-06-28 MED ORDER — DEXAMETHASONE SODIUM PHOSPHATE 10 MG/ML IJ SOLN
INTRAMUSCULAR | Status: DC | PRN
  Administered 2020-06-28: 5 mg via INTRAVENOUS

## 2020-06-28 MED ORDER — EPHEDRINE SULFATE-NACL 50-0.9 MG/10ML-% IV SOSY
PREFILLED_SYRINGE | INTRAVENOUS | Status: DC | PRN
  Administered 2020-06-28: 10 mg via INTRAVENOUS
  Administered 2020-06-28: 5 mg via INTRAVENOUS
  Administered 2020-06-28: 10 mg via INTRAVENOUS

## 2020-06-28 MED ORDER — EPHEDRINE 5 MG/ML INJ
INTRAVENOUS | Status: AC
  Filled 2020-06-28: qty 10

## 2020-06-28 MED ORDER — 0.9 % SODIUM CHLORIDE (POUR BTL) OPTIME
TOPICAL | Status: DC | PRN
  Administered 2020-06-28: 1000 mL

## 2020-06-28 MED ORDER — CHLORHEXIDINE GLUCONATE 4 % EX LIQD: 60.0000 mL | Freq: Once | CUTANEOUS | Status: DC

## 2020-06-28 MED ORDER — SODIUM CHLORIDE 0.9 % IV SOLN: INTRAVENOUS | Status: DC

## 2020-06-28 MED ORDER — FENTANYL CITRATE (PF) 250 MCG/5ML IJ SOLN
INTRAMUSCULAR | Status: AC
  Filled 2020-06-28: qty 5

## 2020-06-28 MED ORDER — CEFAZOLIN SODIUM-DEXTROSE 2-4 GM/100ML-% IV SOLN
2.0000 g | INTRAVENOUS | Status: AC
  Administered 2020-06-28: 2 g via INTRAVENOUS

## 2020-06-28 MED ORDER — ROCURONIUM BROMIDE 10 MG/ML (PF) SYRINGE
PREFILLED_SYRINGE | INTRAVENOUS | Status: AC
  Filled 2020-06-28: qty 10

## 2020-06-28 MED ORDER — ONDANSETRON HCL 4 MG/2ML IJ SOLN
INTRAMUSCULAR | Status: DC | PRN
  Administered 2020-06-28: 4 mg via INTRAVENOUS

## 2020-06-28 MED ORDER — SODIUM CHLORIDE 0.9 % IV SOLN
INTRAVENOUS | Status: AC
  Filled 2020-06-28: qty 1.2

## 2020-06-28 MED ORDER — PHENYLEPHRINE HCL-NACL 10-0.9 MG/250ML-% IV SOLN
INTRAVENOUS | Status: DC | PRN
  Administered 2020-06-28: 50 ug/min via INTRAVENOUS

## 2020-06-28 MED ORDER — CHLORHEXIDINE GLUCONATE 0.12 % MT SOLN
OROMUCOSAL | Status: AC
  Administered 2020-06-28: 15 mL
  Filled 2020-06-28: qty 15

## 2020-06-28 MED ORDER — LIDOCAINE 2% (20 MG/ML) 5 ML SYRINGE
INTRAMUSCULAR | Status: AC
  Filled 2020-06-28: qty 5

## 2020-06-28 MED ORDER — PHENYLEPHRINE 40 MCG/ML (10ML) SYRINGE FOR IV PUSH (FOR BLOOD PRESSURE SUPPORT)
PREFILLED_SYRINGE | INTRAVENOUS | Status: AC
  Filled 2020-06-28: qty 10

## 2020-06-28 MED ORDER — FENTANYL CITRATE (PF) 100 MCG/2ML IJ SOLN
INTRAMUSCULAR | Status: DC | PRN
Start: 2020-06-28 — End: 2020-06-28
  Administered 2020-06-28: 25 ug via INTRAVENOUS
  Administered 2020-06-28 (×2): 50 ug via INTRAVENOUS

## 2020-06-28 SURGICAL SUPPLY — 38 items
ARMBAND PINK RESTRICT EXTREMIT (MISCELLANEOUS) ×2 IMPLANT
CANISTER SUCT 3000ML PPV (MISCELLANEOUS) ×2 IMPLANT
CLIP VESOCCLUDE MED 6/CT (CLIP) ×2 IMPLANT
CLIP VESOCCLUDE SM WIDE 6/CT (CLIP) ×6 IMPLANT
COVER PROBE W GEL 5X96 (DRAPES) IMPLANT
COVER WAND RF STERILE (DRAPES) ×2 IMPLANT
DERMABOND ADVANCED (GAUZE/BANDAGES/DRESSINGS) ×2
DERMABOND ADVANCED .7 DNX12 (GAUZE/BANDAGES/DRESSINGS) ×2 IMPLANT
DRAIN PENROSE 1/4X12 LTX STRL (WOUND CARE) IMPLANT
ELECT REM PT RETURN 9FT ADLT (ELECTROSURGICAL) ×2
ELECTRODE REM PT RTRN 9FT ADLT (ELECTROSURGICAL) ×1 IMPLANT
GLOVE BIO SURGEON STRL SZ 6.5 (GLOVE) ×4 IMPLANT
GLOVE BIO SURGEON STRL SZ7.5 (GLOVE) ×2 IMPLANT
GLOVE BIOGEL PI IND STRL 6.5 (GLOVE) ×2 IMPLANT
GLOVE BIOGEL PI INDICATOR 6.5 (GLOVE) ×2
GLOVE SURG SS PI 7.0 STRL IVOR (GLOVE) ×2 IMPLANT
GOWN STRL REUS W/ TWL LRG LVL3 (GOWN DISPOSABLE) ×4 IMPLANT
GOWN STRL REUS W/TWL LRG LVL3 (GOWN DISPOSABLE) ×8
HEMOSTAT SPONGE AVITENE ULTRA (HEMOSTASIS) IMPLANT
KIT BASIN OR (CUSTOM PROCEDURE TRAY) ×2 IMPLANT
KIT TURNOVER KIT B (KITS) ×2 IMPLANT
LOOP VESSEL MINI RED (MISCELLANEOUS) IMPLANT
NS IRRIG 1000ML POUR BTL (IV SOLUTION) ×2 IMPLANT
PACK CV ACCESS (CUSTOM PROCEDURE TRAY) ×2 IMPLANT
PAD ARMBOARD 7.5X6 YLW CONV (MISCELLANEOUS) ×4 IMPLANT
SUT PROLENE 5 0 C 1 24 (SUTURE) IMPLANT
SUT PROLENE 7 0 BV 1 (SUTURE) ×2 IMPLANT
SUT SILK 3 0 (SUTURE) ×2
SUT SILK 3-0 18XBRD TIE 12 (SUTURE) ×1 IMPLANT
SUT VIC AB 2-0 SH 27 (SUTURE) ×6
SUT VIC AB 2-0 SH 27XBRD (SUTURE) ×3 IMPLANT
SUT VIC AB 3-0 SH 27 (SUTURE) ×6
SUT VIC AB 3-0 SH 27X BRD (SUTURE) ×3 IMPLANT
SUT VICRYL 4-0 PS2 18IN ABS (SUTURE) ×6 IMPLANT
SYR BULB IRRIG 60ML STRL (SYRINGE) ×2 IMPLANT
TOWEL GREEN STERILE (TOWEL DISPOSABLE) ×2 IMPLANT
UNDERPAD 30X36 HEAVY ABSORB (UNDERPADS AND DIAPERS) ×2 IMPLANT
WATER STERILE IRR 1000ML POUR (IV SOLUTION) ×2 IMPLANT

## 2020-06-28 NOTE — Anesthesia Procedure Notes (Signed)
Procedure Name: LMA Insertion Date/Time: 06/28/2020 12:30 PM Performed by: Gwyndolyn Saxon, CRNA Pre-anesthesia Checklist: Patient identified, Emergency Drugs available, Suction available and Patient being monitored Patient Re-evaluated:Patient Re-evaluated prior to induction Oxygen Delivery Method: Circle system utilized Preoxygenation: Pre-oxygenation with 100% oxygen Induction Type: IV induction Ventilation: Mask ventilation without difficulty LMA: LMA inserted LMA Size: 4.0 Number of attempts: 1 Airway Equipment and Method: Patient positioned with wedge pillow Placement Confirmation: positive ETCO2 and breath sounds checked- equal and bilateral Tube secured with: Tape Dental Injury: Teeth and Oropharynx as per pre-operative assessment

## 2020-06-28 NOTE — Interval H&P Note (Signed)
History and Physical Interval Note:  06/28/2020 9:39 AM  Cassandra Parker  has presented today for surgery, with the diagnosis of CHRONIC KIDNEY DISEASE STAGE IV.  The various methods of treatment have been discussed with the patient and family. After consideration of risks, benefits and other options for treatment, the patient has consented to  Procedure(s): SUPERFICIALIZATION RIGHT ARM FISTULA (Right) as a surgical intervention.  The patient's history has been reviewed, patient examined, no change in status, stable for surgery.  I have reviewed the patient's chart and labs.  Questions were answered to the patient's satisfaction.     Ruta Hinds

## 2020-06-28 NOTE — Discharge Instructions (Signed)
   [facility]  Discharge Instructions  AV Fistula or Graft Surgery for Dialysis Access  Please refer to the following instructions for your post-procedure care. Your surgeon or physician assistant will discuss any changes with you.  Activity  You may drive the day following your surgery, if you are comfortable and no longer taking prescription pain medication. Resume full activity as the soreness in your incision resolves.  Bathing/Showering  You may shower after you go home. Keep your incision dry for 48 hours. Do not soak in a bathtub, hot tub, or swim until the incision heals completely. You may not shower if you have a hemodialysis catheter.  Incision Care  Clean your incision with mild soap and water after 48 hours. Pat the area dry with a clean towel. You do not need a bandage unless otherwise instructed. Do not apply any ointments or creams to your incision. You may have skin glue on your incision. Do not peel it off. It will come off on its own in about one week. Your arm may swell a bit after surgery. To reduce swelling use pillows to elevate your arm so it is above your heart. Your doctor will tell you if you need to lightly wrap your arm with an ACE bandage.  Diet  Resume your normal diet. There are not special food restrictions following this procedure. In order to heal from your surgery, it is CRITICAL to get adequate nutrition. Your body requires vitamins, minerals, and protein. Vegetables are the best source of vitamins and minerals. Vegetables also provide the perfect balance of protein. Processed food has little nutritional value, so try to avoid this.  Medications  Resume taking all of your medications. If your incision is causing pain, you may take over-the counter pain relievers such as acetaminophen (Tylenol). If you were prescribed a stronger pain medication, please be aware these medications can cause nausea and constipation. Prevent  nausea by taking the medication with a snack or meal. Avoid constipation by drinking plenty of fluids and eating foods with high amount of fiber, such as fruits, vegetables, and grains.  Do not take Tylenol if you are taking prescription pain medications.  Follow up Your surgeon may want to see you in the office following your access surgery. If so, this will be arranged at the time of your surgery.  Please call us immediately for any of the following conditions:  . Increased pain, redness, drainage (pus) from your incision site . Fever of 101 degrees or higher . Severe or worsening pain at your incision site . Hand pain or numbness. .  Reduce your risk of vascular disease:  . Stop smoking. If you would like help, call [facility] at 1-800-QUIT-NOW 626-755-9874) or [place] at 518-048-1925  . Manage your cholesterol . Maintain a desired weight . Control your diabetes . Keep your blood pressure down  Dialysis  It will take several weeks to several months for your new dialysis access to be ready for use. Your surgeon will determine when it is okay to use it. Your nephrologist will continue to direct your dialysis. You can continue to use your Permcath until your new access is ready for use.   06/28/20[name] [id number] 08/24/1941  Surgeon(s): Elam Dutch, MD  Procedure(s): SUPERFICIALIZATION RIGHT ARM FISTULA  x Do not stick fistula for 8 weeks    If you have any questions, please call the office at [phone].

## 2020-06-28 NOTE — Op Note (Signed)
Procedure: Superficialization of right brachiocephalic AV fistula with multiple sidebranch ligation  Preoperative diagnosis: Poorly maturing AV fistula right arm  Postoperative diagnosis: Same  Anesthesia: General  Assistant: [name], PA-C to assist with exposure expediting procedure  Operative findings: Patent right brachiocephalic AV fistula 6 to 7 mm diameter, 3 large and several small side branches ligated  Operative details: After pain informed consent, the patient taken the operating.  The patient was placed in supine position operating table.  After induction of general anesthesia patient's right upper extremities prepped and draped in usual sterile fashion.  Next ultrasound was used to identify the course of the brachiocephalic AV fistula.  Longitudinal incision was made just above the antecubital crease carried down through the subcutaneous tissues down the level AV fistula.  It was about 6 mm in diameter.  It was dissected free circumferentially.  Small side branches were ligated divided to silk ties.  I then proceeded to make 2 additional incisions up the arm to fully mobilized the cephalic vein all the way to the cephalic junction with the subclavian vein.  The vein was several centimeters deep in some segments.  After the vein had been fully mobilized the subcutaneous adipose tissue was fully debrided away from the skin edges and underneath the skin bridges so that the fistula could rise to the skin surface.  Hemostasis was obtained.  Several 2-0 Vicryl sutures were placed in the subcutaneous tissues to tack the suture up towards the skin level.  The skin was then closed in all 3 incisions with a running 3-0 subcutaneous stitch and a running 4-0 Vicryl subcuticular stitch.  Dermabond was applied.  The patient tolerated procedure well and there were no complications.  The instrument sponge and needle count was correct end of the case.  The patient was taken the recovery room in stable  condition.  Ruta Hinds, MD [facility] [place] Office: [phone]

## 2020-06-28 NOTE — Transfer of Care (Signed)
Immediate Anesthesia Transfer of Care Note  Patient: [name]  Procedure(s) Performed: SUPERFICIALIZATION RIGHT ARM FISTULA (Right Arm Upper)  Patient Location: PACU  Anesthesia Type:General  Level of Consciousness: awake and patient cooperative  Airway & Oxygen Therapy: Patient Spontanous Breathing and Patient connected to face mask oxygen  Post-op Assessment: Report given to RN and Post -op Vital signs reviewed and stable  Post vital signs: Reviewed and stable  Last Vitals:  Vitals Value Taken Time  BP 168/86 06/28/20 1427  Temp    Pulse 78 06/28/20 1430  Resp 21 06/28/20 1430  SpO2 100 % 06/28/20 1430  Vitals shown include unvalidated device data.  Last Pain:  Vitals:   06/28/20 0801  TempSrc:   PainSc: 0-No pain      Patients Stated Pain Goal: 3 (76/16/07 3710)  Complications: No complications documented.

## 2020-06-28 NOTE — Progress Notes (Signed)
PDMP aware reviewed.  Post op narcotics prescribed.  Cassandra Parker, [facility] 06/28/2020 2:18 PM

## 2020-06-28 NOTE — Anesthesia Postprocedure Evaluation (Signed)
Anesthesia Post Note  Patient: Cassandra Parker  Procedure(s) Performed: SUPERFICIALIZATION RIGHT ARM FISTULA (Right Arm Upper)     Patient location during evaluation: PACU Anesthesia Type: General Level of consciousness: awake and alert Pain management: pain level controlled Vital Signs Assessment: post-procedure vital signs reviewed and stable Respiratory status: spontaneous breathing, nonlabored ventilation, respiratory function stable and patient connected to nasal cannula oxygen Cardiovascular status: blood pressure returned to baseline and stable Postop Assessment: no apparent nausea or vomiting Anesthetic complications: no   No complications documented.  Last Vitals:  Vitals:   06/28/20 1442 06/28/20 1457  BP: (!) 162/72 (!) 148/66  Pulse: 73 72  Resp: 17 15  Temp:  (!) 36.2 C  SpO2: 92% 92%    Last Pain:  Vitals:   06/28/20 1457  TempSrc:   PainSc: 0-No pain                 Audry Pili

## 2020-06-29 ENCOUNTER — Encounter (HOSPITAL_COMMUNITY): Payer: Self-pay | Admitting: Vascular Surgery

## 2020-06-30 DIAGNOSIS — I0981 Rheumatic heart failure: Secondary | ICD-10-CM | POA: Diagnosis not present

## 2020-06-30 DIAGNOSIS — I051 Rheumatic mitral insufficiency: Secondary | ICD-10-CM | POA: Diagnosis not present

## 2020-06-30 DIAGNOSIS — I5022 Chronic systolic (congestive) heart failure: Secondary | ICD-10-CM | POA: Diagnosis not present

## 2020-06-30 DIAGNOSIS — D631 Anemia in chronic kidney disease: Secondary | ICD-10-CM | POA: Diagnosis not present

## 2020-06-30 DIAGNOSIS — N185 Chronic kidney disease, stage 5: Secondary | ICD-10-CM | POA: Diagnosis not present

## 2020-06-30 DIAGNOSIS — E1122 Type 2 diabetes mellitus with diabetic chronic kidney disease: Secondary | ICD-10-CM | POA: Diagnosis not present

## 2020-06-30 DIAGNOSIS — I272 Pulmonary hypertension, unspecified: Secondary | ICD-10-CM | POA: Diagnosis not present

## 2020-06-30 DIAGNOSIS — I132 Hypertensive heart and chronic kidney disease with heart failure and with stage 5 chronic kidney disease, or end stage renal disease: Secondary | ICD-10-CM | POA: Diagnosis not present

## 2020-06-30 DIAGNOSIS — I5033 Acute on chronic diastolic (congestive) heart failure: Secondary | ICD-10-CM | POA: Diagnosis not present

## 2020-07-01 DIAGNOSIS — I272 Pulmonary hypertension, unspecified: Secondary | ICD-10-CM | POA: Diagnosis not present

## 2020-07-01 DIAGNOSIS — A419 Sepsis, unspecified organism: Secondary | ICD-10-CM | POA: Diagnosis not present

## 2020-07-01 DIAGNOSIS — K922 Gastrointestinal hemorrhage, unspecified: Secondary | ICD-10-CM | POA: Diagnosis not present

## 2020-07-01 DIAGNOSIS — J189 Pneumonia, unspecified organism: Secondary | ICD-10-CM | POA: Diagnosis not present

## 2020-07-01 DIAGNOSIS — J9601 Acute respiratory failure with hypoxia: Secondary | ICD-10-CM | POA: Diagnosis not present

## 2020-07-02 DIAGNOSIS — I051 Rheumatic mitral insufficiency: Secondary | ICD-10-CM | POA: Diagnosis not present

## 2020-07-02 DIAGNOSIS — E1122 Type 2 diabetes mellitus with diabetic chronic kidney disease: Secondary | ICD-10-CM | POA: Diagnosis not present

## 2020-07-02 DIAGNOSIS — I0981 Rheumatic heart failure: Secondary | ICD-10-CM | POA: Diagnosis not present

## 2020-07-02 DIAGNOSIS — I132 Hypertensive heart and chronic kidney disease with heart failure and with stage 5 chronic kidney disease, or end stage renal disease: Secondary | ICD-10-CM | POA: Diagnosis not present

## 2020-07-02 DIAGNOSIS — D631 Anemia in chronic kidney disease: Secondary | ICD-10-CM | POA: Diagnosis not present

## 2020-07-02 DIAGNOSIS — I5022 Chronic systolic (congestive) heart failure: Secondary | ICD-10-CM | POA: Diagnosis not present

## 2020-07-02 DIAGNOSIS — I272 Pulmonary hypertension, unspecified: Secondary | ICD-10-CM | POA: Diagnosis not present

## 2020-07-02 DIAGNOSIS — N185 Chronic kidney disease, stage 5: Secondary | ICD-10-CM | POA: Diagnosis not present

## 2020-07-02 DIAGNOSIS — I5033 Acute on chronic diastolic (congestive) heart failure: Secondary | ICD-10-CM | POA: Diagnosis not present

## 2020-07-05 ENCOUNTER — Encounter (HOSPITAL_COMMUNITY): Payer: Self-pay | Admitting: Vascular Surgery

## 2020-07-06 DIAGNOSIS — E1122 Type 2 diabetes mellitus with diabetic chronic kidney disease: Secondary | ICD-10-CM | POA: Diagnosis not present

## 2020-07-06 DIAGNOSIS — N185 Chronic kidney disease, stage 5: Secondary | ICD-10-CM | POA: Diagnosis not present

## 2020-07-06 DIAGNOSIS — I272 Pulmonary hypertension, unspecified: Secondary | ICD-10-CM | POA: Diagnosis not present

## 2020-07-06 DIAGNOSIS — I0981 Rheumatic heart failure: Secondary | ICD-10-CM | POA: Diagnosis not present

## 2020-07-06 DIAGNOSIS — I132 Hypertensive heart and chronic kidney disease with heart failure and with stage 5 chronic kidney disease, or end stage renal disease: Secondary | ICD-10-CM | POA: Diagnosis not present

## 2020-07-06 DIAGNOSIS — I5033 Acute on chronic diastolic (congestive) heart failure: Secondary | ICD-10-CM | POA: Diagnosis not present

## 2020-07-06 DIAGNOSIS — D631 Anemia in chronic kidney disease: Secondary | ICD-10-CM | POA: Diagnosis not present

## 2020-07-06 DIAGNOSIS — I5022 Chronic systolic (congestive) heart failure: Secondary | ICD-10-CM | POA: Diagnosis not present

## 2020-07-06 DIAGNOSIS — I051 Rheumatic mitral insufficiency: Secondary | ICD-10-CM | POA: Diagnosis not present

## 2020-07-08 DIAGNOSIS — N185 Chronic kidney disease, stage 5: Secondary | ICD-10-CM | POA: Diagnosis not present

## 2020-07-09 DIAGNOSIS — E7800 Pure hypercholesterolemia, unspecified: Secondary | ICD-10-CM | POA: Diagnosis not present

## 2020-07-09 DIAGNOSIS — E1121 Type 2 diabetes mellitus with diabetic nephropathy: Secondary | ICD-10-CM | POA: Diagnosis not present

## 2020-07-09 DIAGNOSIS — R0602 Shortness of breath: Secondary | ICD-10-CM | POA: Diagnosis not present

## 2020-07-09 DIAGNOSIS — E782 Mixed hyperlipidemia: Secondary | ICD-10-CM | POA: Diagnosis not present

## 2020-07-09 DIAGNOSIS — Z6838 Body mass index (BMI) 38.0-38.9, adult: Secondary | ICD-10-CM | POA: Diagnosis not present

## 2020-07-09 DIAGNOSIS — R609 Edema, unspecified: Secondary | ICD-10-CM | POA: Diagnosis not present

## 2020-07-09 DIAGNOSIS — I10 Essential (primary) hypertension: Secondary | ICD-10-CM | POA: Diagnosis not present

## 2020-07-09 DIAGNOSIS — Z0001 Encounter for general adult medical examination with abnormal findings: Secondary | ICD-10-CM | POA: Diagnosis not present

## 2020-07-09 DIAGNOSIS — I5033 Acute on chronic diastolic (congestive) heart failure: Secondary | ICD-10-CM | POA: Diagnosis not present

## 2020-07-09 DIAGNOSIS — N184 Chronic kidney disease, stage 4 (severe): Secondary | ICD-10-CM | POA: Diagnosis not present

## 2020-07-14 DIAGNOSIS — I272 Pulmonary hypertension, unspecified: Secondary | ICD-10-CM | POA: Diagnosis not present

## 2020-07-14 DIAGNOSIS — I0981 Rheumatic heart failure: Secondary | ICD-10-CM | POA: Diagnosis not present

## 2020-07-14 DIAGNOSIS — D631 Anemia in chronic kidney disease: Secondary | ICD-10-CM | POA: Diagnosis not present

## 2020-07-14 DIAGNOSIS — I132 Hypertensive heart and chronic kidney disease with heart failure and with stage 5 chronic kidney disease, or end stage renal disease: Secondary | ICD-10-CM | POA: Diagnosis not present

## 2020-07-14 DIAGNOSIS — I5022 Chronic systolic (congestive) heart failure: Secondary | ICD-10-CM | POA: Diagnosis not present

## 2020-07-14 DIAGNOSIS — N185 Chronic kidney disease, stage 5: Secondary | ICD-10-CM | POA: Diagnosis not present

## 2020-07-14 DIAGNOSIS — I5033 Acute on chronic diastolic (congestive) heart failure: Secondary | ICD-10-CM | POA: Diagnosis not present

## 2020-07-14 DIAGNOSIS — E1122 Type 2 diabetes mellitus with diabetic chronic kidney disease: Secondary | ICD-10-CM | POA: Diagnosis not present

## 2020-07-14 DIAGNOSIS — I051 Rheumatic mitral insufficiency: Secondary | ICD-10-CM | POA: Diagnosis not present

## 2020-07-20 DIAGNOSIS — I132 Hypertensive heart and chronic kidney disease with heart failure and with stage 5 chronic kidney disease, or end stage renal disease: Secondary | ICD-10-CM | POA: Diagnosis not present

## 2020-07-20 DIAGNOSIS — I0981 Rheumatic heart failure: Secondary | ICD-10-CM | POA: Diagnosis not present

## 2020-07-20 DIAGNOSIS — D631 Anemia in chronic kidney disease: Secondary | ICD-10-CM | POA: Diagnosis not present

## 2020-07-20 DIAGNOSIS — N185 Chronic kidney disease, stage 5: Secondary | ICD-10-CM | POA: Diagnosis not present

## 2020-07-20 DIAGNOSIS — I051 Rheumatic mitral insufficiency: Secondary | ICD-10-CM | POA: Diagnosis not present

## 2020-07-20 DIAGNOSIS — I5033 Acute on chronic diastolic (congestive) heart failure: Secondary | ICD-10-CM | POA: Diagnosis not present

## 2020-07-20 DIAGNOSIS — E1122 Type 2 diabetes mellitus with diabetic chronic kidney disease: Secondary | ICD-10-CM | POA: Diagnosis not present

## 2020-07-20 DIAGNOSIS — I272 Pulmonary hypertension, unspecified: Secondary | ICD-10-CM | POA: Diagnosis not present

## 2020-07-20 DIAGNOSIS — I5022 Chronic systolic (congestive) heart failure: Secondary | ICD-10-CM | POA: Diagnosis not present

## 2020-07-21 ENCOUNTER — Ambulatory Visit: Payer: Medicare HMO | Admitting: Gastroenterology

## 2020-07-22 ENCOUNTER — Ambulatory Visit (INDEPENDENT_AMBULATORY_CARE_PROVIDER_SITE_OTHER): Payer: Self-pay | Admitting: Physician Assistant

## 2020-07-22 ENCOUNTER — Other Ambulatory Visit: Payer: Self-pay

## 2020-07-22 VITALS — BP 177/78 | HR 82 | Temp 98.4°F | Resp 20 | Ht 63.0 in | Wt 215.9 lb

## 2020-07-22 DIAGNOSIS — N184 Chronic kidney disease, stage 4 (severe): Secondary | ICD-10-CM

## 2020-07-22 NOTE — Progress Notes (Signed)
  POST OPERATIVE DIALYSIS ACCESS OFFICE NOTE    CC:  F/u for dialysis access surgery  HPI:  This is a 79 y.o. female who is s/p right BC AVF on 05/18/2020 by Dr. Oneida Alar. Most recently she underwent surperficialization of her fistula on 06/29/2020.  She denies hand pain or numbness. CKD stage 4 is stable at this time.  No Known Allergies  Current Outpatient Medications  Medication Sig Dispense Refill  . ACCU-CHEK GUIDE test strip     . Accu-Chek Softclix Lancets lancets SMARTSIG:Topical    . acetaminophen (TYLENOL) 325 MG tablet Take 2 tablets (650 mg total) by mouth every 6 (six) hours as needed for mild pain or headache (or Fever >/= 101). 30 tablet 2  . albuterol (PROVENTIL) (2.5 MG/3ML) 0.083% nebulizer solution Take 3 mLs (2.5 mg total) by nebulization every 2 (two) hours as needed for wheezing or shortness of breath. 75 mL 12  . Alcohol Swabs (B-D SINGLE USE SWABS REGULAR) PADS Apply topically.    . carvedilol (COREG) 6.25 MG tablet Take 1 tablet (6.25 mg total) by mouth 2 (two) times daily. 60 tablet 5  . Cholecalciferol (VITAMIN D) 2000 UNITS tablet Take 2,000 Units by mouth daily.     . furosemide (LASIX) 40 MG tablet Take 3 tablets (120 mg total) by mouth 2 (two) times daily. 180 tablet 0  . glipiZIDE (GLUCOTROL XL) 5 MG 24 hr tablet Take 5 mg by mouth See admin instructions. Take 10 mg in the morning and 5 mg at night    . HYDROcodone-acetaminophen (NORCO/VICODIN) 5-325 MG tablet Take 1 tablet by mouth every 6 (six) hours as needed for moderate pain. 20 tablet 0  . isosorbide mononitrate (IMDUR) 30 MG 24 hr tablet Take 1 tablet (30 mg total) by mouth daily. 30 tablet 11  . pantoprazole (PROTONIX) 40 MG tablet Take 1 tablet (40 mg total) by mouth daily. 30 tablet 3  . potassium chloride (KLOR-CON) 20 MEQ tablet Take 0.5 tablets (10 mEq total) by mouth daily. 15 tablet 0  . pravastatin (PRAVACHOL) 10 MG tablet Take 10 mg by mouth at bedtime.     . vitamin B-12 (CYANOCOBALAMIN) 1000  MCG tablet Take 1,000 mcg by mouth daily.     No current facility-administered medications for this visit.     ROS:  See HPI  BP (!) 177/78 (BP Location: Left Arm, Patient Position: Sitting, Cuff Size: Large)   Pulse 82   Temp 98.4 F (36.9 C) (Temporal)   Resp 20   Ht 5\' 3"  (1.6 m)   Wt 215 lb 14.4 oz (97.9 kg)   SpO2 92%   BMI 38.24 kg/m    Physical Exam:  General appearance:WD, WN in NAD Cardiac:RRR Respiratory:mild wheezes, nonlabored Incision:  Healing without signs of infection Extremities:  RUE: 5/5 grip strength. Hand is warm and well perfused. Good thrill and bruit in fistula  Assessment/Plan:   -pt does not have evidence of steal syndrome -dialysis duplex today reveals fistula to be of adequate diameter and depth for access -the fistula/graft may be used starting, if necessary, in 2-3 weeks once incisions are fully healed  Barbie Banner, PA-C 07/22/2020 11:18 AM [facility] [phone]  Clinic MD:  Oneida Alar

## 2020-07-24 DIAGNOSIS — I129 Hypertensive chronic kidney disease with stage 1 through stage 4 chronic kidney disease, or unspecified chronic kidney disease: Secondary | ICD-10-CM | POA: Diagnosis not present

## 2020-07-24 DIAGNOSIS — N184 Chronic kidney disease, stage 4 (severe): Secondary | ICD-10-CM | POA: Diagnosis not present

## 2020-07-24 DIAGNOSIS — E1165 Type 2 diabetes mellitus with hyperglycemia: Secondary | ICD-10-CM | POA: Diagnosis not present

## 2020-07-24 DIAGNOSIS — E7849 Other hyperlipidemia: Secondary | ICD-10-CM | POA: Diagnosis not present

## 2020-07-26 DIAGNOSIS — E1122 Type 2 diabetes mellitus with diabetic chronic kidney disease: Secondary | ICD-10-CM | POA: Diagnosis not present

## 2020-07-26 DIAGNOSIS — I770 Arteriovenous fistula, acquired: Secondary | ICD-10-CM | POA: Diagnosis not present

## 2020-07-26 DIAGNOSIS — N184 Chronic kidney disease, stage 4 (severe): Secondary | ICD-10-CM | POA: Diagnosis not present

## 2020-07-26 DIAGNOSIS — D631 Anemia in chronic kidney disease: Secondary | ICD-10-CM | POA: Diagnosis not present

## 2020-07-26 DIAGNOSIS — I129 Hypertensive chronic kidney disease with stage 1 through stage 4 chronic kidney disease, or unspecified chronic kidney disease: Secondary | ICD-10-CM | POA: Diagnosis not present

## 2020-07-26 DIAGNOSIS — N2581 Secondary hyperparathyroidism of renal origin: Secondary | ICD-10-CM | POA: Diagnosis not present

## 2020-07-27 ENCOUNTER — Inpatient Hospital Stay (HOSPITAL_COMMUNITY): Payer: Medicare HMO | Admitting: Certified Registered"

## 2020-07-27 ENCOUNTER — Emergency Department (HOSPITAL_COMMUNITY): Payer: Medicare HMO

## 2020-07-27 ENCOUNTER — Inpatient Hospital Stay (HOSPITAL_COMMUNITY): Payer: Medicare HMO

## 2020-07-27 ENCOUNTER — Emergency Department (HOSPITAL_COMMUNITY)
Admission: RE | Admit: 2020-07-27 | Discharge: 2020-07-27 | Disposition: A | Discharge status: Home / Self Care | Payer: Medicare HMO | Source: Ambulatory Visit | Attending: Student in an Organized Health Care Education/Training Program | Admitting: Student in an Organized Health Care Education/Training Program

## 2020-07-27 ENCOUNTER — Encounter (HOSPITAL_COMMUNITY): Admission: EM | Disposition: E | Payer: Self-pay | Source: Home / Self Care | Attending: Neurology

## 2020-07-27 ENCOUNTER — Encounter (HOSPITAL_COMMUNITY): Payer: Self-pay | Admitting: Emergency Medicine

## 2020-07-27 ENCOUNTER — Inpatient Hospital Stay (HOSPITAL_COMMUNITY)
Admission: EM | Admit: 2020-07-27 | Discharge: 2020-08-25 | DRG: 023 | Disposition: E | Payer: Medicare HMO | Attending: Neurology | Admitting: Neurology

## 2020-07-27 DIAGNOSIS — I63232 Cerebral infarction due to unspecified occlusion or stenosis of left carotid arteries: Secondary | ICD-10-CM | POA: Diagnosis not present

## 2020-07-27 DIAGNOSIS — G935 Compression of brain: Secondary | ICD-10-CM | POA: Diagnosis not present

## 2020-07-27 DIAGNOSIS — Z853 Personal history of malignant neoplasm of breast: Secondary | ICD-10-CM

## 2020-07-27 DIAGNOSIS — J95821 Acute postprocedural respiratory failure: Secondary | ICD-10-CM | POA: Diagnosis not present

## 2020-07-27 DIAGNOSIS — D689 Coagulation defect, unspecified: Secondary | ICD-10-CM | POA: Diagnosis not present

## 2020-07-27 DIAGNOSIS — R0689 Other abnormalities of breathing: Secondary | ICD-10-CM | POA: Diagnosis not present

## 2020-07-27 DIAGNOSIS — J9601 Acute respiratory failure with hypoxia: Secondary | ICD-10-CM | POA: Diagnosis not present

## 2020-07-27 DIAGNOSIS — G8191 Hemiplegia, unspecified affecting right dominant side: Secondary | ICD-10-CM | POA: Diagnosis present

## 2020-07-27 DIAGNOSIS — I609 Nontraumatic subarachnoid hemorrhage, unspecified: Secondary | ICD-10-CM | POA: Diagnosis not present

## 2020-07-27 DIAGNOSIS — I63512 Cerebral infarction due to unspecified occlusion or stenosis of left middle cerebral artery: Secondary | ICD-10-CM | POA: Diagnosis not present

## 2020-07-27 DIAGNOSIS — D649 Anemia, unspecified: Secondary | ICD-10-CM | POA: Diagnosis not present

## 2020-07-27 DIAGNOSIS — J189 Pneumonia, unspecified organism: Secondary | ICD-10-CM | POA: Diagnosis not present

## 2020-07-27 DIAGNOSIS — Z515 Encounter for palliative care: Secondary | ICD-10-CM

## 2020-07-27 DIAGNOSIS — E1122 Type 2 diabetes mellitus with diabetic chronic kidney disease: Secondary | ICD-10-CM | POA: Diagnosis present

## 2020-07-27 DIAGNOSIS — E785 Hyperlipidemia, unspecified: Secondary | ICD-10-CM | POA: Diagnosis present

## 2020-07-27 DIAGNOSIS — I5042 Chronic combined systolic (congestive) and diastolic (congestive) heart failure: Secondary | ICD-10-CM | POA: Diagnosis present

## 2020-07-27 DIAGNOSIS — E1140 Type 2 diabetes mellitus with diabetic neuropathy, unspecified: Secondary | ICD-10-CM | POA: Diagnosis present

## 2020-07-27 DIAGNOSIS — Z66 Do not resuscitate: Secondary | ICD-10-CM | POA: Diagnosis not present

## 2020-07-27 DIAGNOSIS — R29810 Facial weakness: Secondary | ICD-10-CM | POA: Diagnosis present

## 2020-07-27 DIAGNOSIS — I272 Pulmonary hypertension, unspecified: Secondary | ICD-10-CM | POA: Diagnosis not present

## 2020-07-27 DIAGNOSIS — Z9221 Personal history of antineoplastic chemotherapy: Secondary | ICD-10-CM

## 2020-07-27 DIAGNOSIS — Y92230 Patient room in hospital as the place of occurrence of the external cause: Secondary | ICD-10-CM | POA: Diagnosis not present

## 2020-07-27 DIAGNOSIS — Z992 Dependence on renal dialysis: Secondary | ICD-10-CM

## 2020-07-27 DIAGNOSIS — I429 Cardiomyopathy, unspecified: Secondary | ICD-10-CM | POA: Diagnosis present

## 2020-07-27 DIAGNOSIS — R4182 Altered mental status, unspecified: Secondary | ICD-10-CM | POA: Diagnosis not present

## 2020-07-27 DIAGNOSIS — Y718 Miscellaneous cardiovascular devices associated with adverse incidents, not elsewhere classified: Secondary | ICD-10-CM | POA: Diagnosis not present

## 2020-07-27 DIAGNOSIS — Z20822 Contact with and (suspected) exposure to covid-19: Secondary | ICD-10-CM | POA: Diagnosis present

## 2020-07-27 DIAGNOSIS — R29724 NIHSS score 24: Secondary | ICD-10-CM | POA: Diagnosis present

## 2020-07-27 DIAGNOSIS — I6389 Other cerebral infarction: Secondary | ICD-10-CM | POA: Diagnosis not present

## 2020-07-27 DIAGNOSIS — I63422 Cerebral infarction due to embolism of left anterior cerebral artery: Secondary | ICD-10-CM | POA: Diagnosis not present

## 2020-07-27 DIAGNOSIS — R404 Transient alteration of awareness: Secondary | ICD-10-CM | POA: Diagnosis not present

## 2020-07-27 DIAGNOSIS — K5730 Diverticulosis of large intestine without perforation or abscess without bleeding: Secondary | ICD-10-CM | POA: Diagnosis not present

## 2020-07-27 DIAGNOSIS — Z79899 Other long term (current) drug therapy: Secondary | ICD-10-CM

## 2020-07-27 DIAGNOSIS — I63412 Cerebral infarction due to embolism of left middle cerebral artery: Secondary | ICD-10-CM | POA: Diagnosis not present

## 2020-07-27 DIAGNOSIS — E1165 Type 2 diabetes mellitus with hyperglycemia: Secondary | ICD-10-CM | POA: Diagnosis not present

## 2020-07-27 DIAGNOSIS — G936 Cerebral edema: Secondary | ICD-10-CM | POA: Diagnosis not present

## 2020-07-27 DIAGNOSIS — I618 Other nontraumatic intracerebral hemorrhage: Secondary | ICD-10-CM | POA: Diagnosis not present

## 2020-07-27 DIAGNOSIS — K219 Gastro-esophageal reflux disease without esophagitis: Secondary | ICD-10-CM | POA: Diagnosis present

## 2020-07-27 DIAGNOSIS — J90 Pleural effusion, not elsewhere classified: Secondary | ICD-10-CM | POA: Diagnosis not present

## 2020-07-27 DIAGNOSIS — R471 Dysarthria and anarthria: Secondary | ICD-10-CM | POA: Diagnosis present

## 2020-07-27 DIAGNOSIS — Z9049 Acquired absence of other specified parts of digestive tract: Secondary | ICD-10-CM | POA: Diagnosis not present

## 2020-07-27 DIAGNOSIS — N179 Acute kidney failure, unspecified: Secondary | ICD-10-CM | POA: Diagnosis not present

## 2020-07-27 DIAGNOSIS — J9600 Acute respiratory failure, unspecified whether with hypoxia or hypercapnia: Secondary | ICD-10-CM

## 2020-07-27 DIAGNOSIS — R29898 Other symptoms and signs involving the musculoskeletal system: Secondary | ICD-10-CM

## 2020-07-27 DIAGNOSIS — D699 Hemorrhagic condition, unspecified: Secondary | ICD-10-CM | POA: Diagnosis not present

## 2020-07-27 DIAGNOSIS — A419 Sepsis, unspecified organism: Secondary | ICD-10-CM | POA: Diagnosis not present

## 2020-07-27 DIAGNOSIS — I6522 Occlusion and stenosis of left carotid artery: Secondary | ICD-10-CM | POA: Diagnosis not present

## 2020-07-27 DIAGNOSIS — I639 Cerebral infarction, unspecified: Secondary | ICD-10-CM

## 2020-07-27 DIAGNOSIS — R410 Disorientation, unspecified: Secondary | ICD-10-CM | POA: Diagnosis not present

## 2020-07-27 DIAGNOSIS — S31139A Puncture wound of abdominal wall without foreign body, unspecified quadrant without penetration into peritoneal cavity, initial encounter: Secondary | ICD-10-CM

## 2020-07-27 DIAGNOSIS — I480 Paroxysmal atrial fibrillation: Secondary | ICD-10-CM | POA: Diagnosis present

## 2020-07-27 DIAGNOSIS — G9341 Metabolic encephalopathy: Secondary | ICD-10-CM | POA: Diagnosis present

## 2020-07-27 DIAGNOSIS — E119 Type 2 diabetes mellitus without complications: Secondary | ICD-10-CM

## 2020-07-27 DIAGNOSIS — R29818 Other symptoms and signs involving the nervous system: Secondary | ICD-10-CM | POA: Diagnosis not present

## 2020-07-27 DIAGNOSIS — I97618 Postprocedural hemorrhage and hematoma of a circulatory system organ or structure following other circulatory system procedure: Secondary | ICD-10-CM | POA: Diagnosis not present

## 2020-07-27 DIAGNOSIS — I6602 Occlusion and stenosis of left middle cerebral artery: Secondary | ICD-10-CM | POA: Insufficient documentation

## 2020-07-27 DIAGNOSIS — I5033 Acute on chronic diastolic (congestive) heart failure: Secondary | ICD-10-CM | POA: Diagnosis not present

## 2020-07-27 DIAGNOSIS — Z9011 Acquired absence of right breast and nipple: Secondary | ICD-10-CM

## 2020-07-27 DIAGNOSIS — I608 Other nontraumatic subarachnoid hemorrhage: Secondary | ICD-10-CM | POA: Diagnosis not present

## 2020-07-27 DIAGNOSIS — N186 End stage renal disease: Secondary | ICD-10-CM | POA: Diagnosis present

## 2020-07-27 DIAGNOSIS — M1712 Unilateral primary osteoarthritis, left knee: Secondary | ICD-10-CM | POA: Diagnosis present

## 2020-07-27 DIAGNOSIS — I700 Atherosclerosis of aorta: Secondary | ICD-10-CM | POA: Diagnosis not present

## 2020-07-27 DIAGNOSIS — I4891 Unspecified atrial fibrillation: Secondary | ICD-10-CM

## 2020-07-27 DIAGNOSIS — I110 Hypertensive heart disease with heart failure: Secondary | ICD-10-CM | POA: Diagnosis not present

## 2020-07-27 DIAGNOSIS — E875 Hyperkalemia: Secondary | ICD-10-CM | POA: Diagnosis present

## 2020-07-27 DIAGNOSIS — I6503 Occlusion and stenosis of bilateral vertebral arteries: Secondary | ICD-10-CM | POA: Diagnosis not present

## 2020-07-27 DIAGNOSIS — Y838 Other surgical procedures as the cause of abnormal reaction of the patient, or of later complication, without mention of misadventure at the time of the procedure: Secondary | ICD-10-CM | POA: Diagnosis not present

## 2020-07-27 DIAGNOSIS — I959 Hypotension, unspecified: Secondary | ICD-10-CM | POA: Diagnosis not present

## 2020-07-27 DIAGNOSIS — I442 Atrioventricular block, complete: Secondary | ICD-10-CM | POA: Diagnosis present

## 2020-07-27 DIAGNOSIS — I361 Nonrheumatic tricuspid (valve) insufficiency: Secondary | ICD-10-CM | POA: Diagnosis not present

## 2020-07-27 DIAGNOSIS — Z961 Presence of intraocular lens: Secondary | ICD-10-CM | POA: Diagnosis present

## 2020-07-27 DIAGNOSIS — Z6838 Body mass index (BMI) 38.0-38.9, adult: Secondary | ICD-10-CM

## 2020-07-27 DIAGNOSIS — K922 Gastrointestinal hemorrhage, unspecified: Secondary | ICD-10-CM | POA: Diagnosis not present

## 2020-07-27 DIAGNOSIS — D62 Acute posthemorrhagic anemia: Secondary | ICD-10-CM

## 2020-07-27 DIAGNOSIS — R739 Hyperglycemia, unspecified: Secondary | ICD-10-CM

## 2020-07-27 DIAGNOSIS — T45615A Adverse effect of thrombolytic drugs, initial encounter: Secondary | ICD-10-CM | POA: Diagnosis not present

## 2020-07-27 DIAGNOSIS — H53461 Homonymous bilateral field defects, right side: Secondary | ICD-10-CM | POA: Diagnosis present

## 2020-07-27 DIAGNOSIS — I132 Hypertensive heart and chronic kidney disease with heart failure and with stage 5 chronic kidney disease, or end stage renal disease: Secondary | ICD-10-CM | POA: Diagnosis present

## 2020-07-27 DIAGNOSIS — Z8249 Family history of ischemic heart disease and other diseases of the circulatory system: Secondary | ICD-10-CM

## 2020-07-27 DIAGNOSIS — E669 Obesity, unspecified: Secondary | ICD-10-CM | POA: Diagnosis present

## 2020-07-27 DIAGNOSIS — Z96652 Presence of left artificial knee joint: Secondary | ICD-10-CM | POA: Diagnosis present

## 2020-07-27 DIAGNOSIS — Z9889 Other specified postprocedural states: Secondary | ICD-10-CM | POA: Diagnosis not present

## 2020-07-27 DIAGNOSIS — Z950 Presence of cardiac pacemaker: Secondary | ICD-10-CM | POA: Diagnosis present

## 2020-07-27 DIAGNOSIS — G8929 Other chronic pain: Secondary | ICD-10-CM | POA: Diagnosis present

## 2020-07-27 DIAGNOSIS — E538 Deficiency of other specified B group vitamins: Secondary | ICD-10-CM | POA: Diagnosis present

## 2020-07-27 DIAGNOSIS — J9691 Respiratory failure, unspecified with hypoxia: Secondary | ICD-10-CM

## 2020-07-27 DIAGNOSIS — I2510 Atherosclerotic heart disease of native coronary artery without angina pectoris: Secondary | ICD-10-CM | POA: Diagnosis present

## 2020-07-27 DIAGNOSIS — I342 Nonrheumatic mitral (valve) stenosis: Secondary | ICD-10-CM | POA: Diagnosis present

## 2020-07-27 DIAGNOSIS — R531 Weakness: Secondary | ICD-10-CM | POA: Diagnosis not present

## 2020-07-27 DIAGNOSIS — Z7984 Long term (current) use of oral hypoglycemic drugs: Secondary | ICD-10-CM

## 2020-07-27 DIAGNOSIS — I6612 Occlusion and stenosis of left anterior cerebral artery: Secondary | ICD-10-CM | POA: Diagnosis not present

## 2020-07-27 DIAGNOSIS — I10 Essential (primary) hypertension: Secondary | ICD-10-CM | POA: Diagnosis not present

## 2020-07-27 DIAGNOSIS — I615 Nontraumatic intracerebral hemorrhage, intraventricular: Secondary | ICD-10-CM

## 2020-07-27 DIAGNOSIS — Z9841 Cataract extraction status, right eye: Secondary | ICD-10-CM

## 2020-07-27 DIAGNOSIS — I340 Nonrheumatic mitral (valve) insufficiency: Secondary | ICD-10-CM | POA: Diagnosis not present

## 2020-07-27 DIAGNOSIS — S066X0A Traumatic subarachnoid hemorrhage without loss of consciousness, initial encounter: Secondary | ICD-10-CM | POA: Diagnosis not present

## 2020-07-27 DIAGNOSIS — I6523 Occlusion and stenosis of bilateral carotid arteries: Secondary | ICD-10-CM | POA: Diagnosis not present

## 2020-07-27 DIAGNOSIS — Z9842 Cataract extraction status, left eye: Secondary | ICD-10-CM

## 2020-07-27 DIAGNOSIS — Z833 Family history of diabetes mellitus: Secondary | ICD-10-CM

## 2020-07-27 DIAGNOSIS — R4701 Aphasia: Secondary | ICD-10-CM | POA: Diagnosis present

## 2020-07-27 DIAGNOSIS — R1310 Dysphagia, unspecified: Secondary | ICD-10-CM | POA: Diagnosis present

## 2020-07-27 HISTORY — PX: RADIOLOGY WITH ANESTHESIA: SHX6223

## 2020-07-27 HISTORY — PX: IR PERCUTANEOUS ART THROMBECTOMY/INFUSION INTRACRANIAL INC DIAG ANGIO: IMG6087

## 2020-07-27 HISTORY — PX: IR CT HEAD LTD: IMG2386

## 2020-07-27 LAB — COMPREHENSIVE METABOLIC PANEL
ALT: 24 U/L (ref 0–44)
AST: 27 U/L (ref 15–41)
Albumin: 3.4 g/dL — ABNORMAL LOW (ref 3.5–5.0)
Alkaline Phosphatase: 64 U/L (ref 38–126)
Anion gap: 16 — ABNORMAL HIGH (ref 5–15)
BUN: 94 mg/dL — ABNORMAL HIGH (ref 8–23)
CO2: 17 mmol/L — ABNORMAL LOW (ref 22–32)
Calcium: 8.6 mg/dL — ABNORMAL LOW (ref 8.9–10.3)
Chloride: 107 mmol/L (ref 98–111)
Creatinine, Ser: 4.32 mg/dL — ABNORMAL HIGH (ref 0.44–1.00)
GFR, Estimated: 10 mL/min — ABNORMAL LOW (ref >60)
Glucose, Bld: 336 mg/dL — ABNORMAL HIGH (ref 70–99)
Potassium: 5.4 mmol/L — ABNORMAL HIGH (ref 3.5–5.1)
Sodium: 140 mmol/L (ref 135–145)
Total Bilirubin: 0.8 mg/dL (ref 0.3–1.2)
Total Protein: 6.7 g/dL (ref 6.5–8.1)

## 2020-07-27 LAB — RESPIRATORY PANEL BY RT PCR (FLU A&B, COVID)
Influenza A by PCR: NEGATIVE
Influenza B by PCR: NEGATIVE
SARS Coronavirus 2 by RT PCR: NEGATIVE

## 2020-07-27 LAB — CBC
HCT: 37.8 % (ref 36.0–46.0)
Hemoglobin: 11.5 g/dL — ABNORMAL LOW (ref 12.0–15.0)
MCH: 29.5 pg (ref 26.0–34.0)
MCHC: 30.4 g/dL (ref 30.0–36.0)
MCV: 96.9 fL (ref 80.0–100.0)
Platelets: 210 10*3/uL (ref 150–400)
RBC: 3.9 MIL/uL (ref 3.87–5.11)
RDW: 16 % — ABNORMAL HIGH (ref 11.5–15.5)
WBC: 10.4 10*3/uL (ref 4.0–10.5)
nRBC: 0 % (ref 0.0–0.2)

## 2020-07-27 LAB — PROTIME-INR
INR: 2.7 — ABNORMAL HIGH (ref 0.8–1.2)
Prothrombin Time: 27.9 seconds — ABNORMAL HIGH (ref 11.4–15.2)

## 2020-07-27 LAB — MRSA PCR SCREENING: MRSA by PCR: NEGATIVE

## 2020-07-27 LAB — BASIC METABOLIC PANEL
Anion gap: 13 (ref 5–15)
BUN: 87 mg/dL — ABNORMAL HIGH (ref 8–23)
CO2: 18 mmol/L — ABNORMAL LOW (ref 22–32)
Calcium: 8.1 mg/dL — ABNORMAL LOW (ref 8.9–10.3)
Chloride: 110 mmol/L (ref 98–111)
Creatinine, Ser: 4.18 mg/dL — ABNORMAL HIGH (ref 0.44–1.00)
GFR, Estimated: 10 mL/min — ABNORMAL LOW (ref >60)
Glucose, Bld: 307 mg/dL — ABNORMAL HIGH (ref 70–99)
Potassium: 4 mmol/L (ref 3.5–5.1)
Sodium: 141 mmol/L (ref 135–145)

## 2020-07-27 LAB — DIFFERENTIAL
Abs Immature Granulocytes: 0.12 10*3/uL — ABNORMAL HIGH (ref 0.00–0.07)
Basophils Absolute: 0 10*3/uL (ref 0.0–0.1)
Basophils Relative: 0 %
Eosinophils Absolute: 0 10*3/uL (ref 0.0–0.5)
Eosinophils Relative: 0 %
Immature Granulocytes: 1 %
Lymphocytes Relative: 14 %
Lymphs Abs: 1.4 10*3/uL (ref 0.7–4.0)
Monocytes Absolute: 0.6 10*3/uL (ref 0.1–1.0)
Monocytes Relative: 6 %
Neutro Abs: 8.2 10*3/uL — ABNORMAL HIGH (ref 1.7–7.7)
Neutrophils Relative %: 79 %

## 2020-07-27 LAB — APTT: aPTT: 146 seconds — ABNORMAL HIGH (ref 24–36)

## 2020-07-27 LAB — GLUCOSE, CAPILLARY
Glucose-Capillary: 238 mg/dL — ABNORMAL HIGH (ref 70–99)
Glucose-Capillary: 263 mg/dL — ABNORMAL HIGH (ref 70–99)

## 2020-07-27 LAB — CBG MONITORING, ED: Glucose-Capillary: 366 mg/dL — ABNORMAL HIGH (ref 70–99)

## 2020-07-27 SURGERY — IR WITH ANESTHESIA
Anesthesia: General

## 2020-07-27 MED ORDER — IOHEXOL 300 MG/ML  SOLN
50.0000 mL | Freq: Once | INTRAMUSCULAR | Status: AC | PRN
  Administered 2020-07-27: 36 mL via INTRA_ARTERIAL

## 2020-07-27 MED ORDER — CEFAZOLIN SODIUM-DEXTROSE 2-4 GM/100ML-% IV SOLN
INTRAVENOUS | Status: AC
  Filled 2020-07-27: qty 100

## 2020-07-27 MED ORDER — LIDOCAINE 2% (20 MG/ML) 5 ML SYRINGE
INTRAMUSCULAR | Status: DC | PRN
  Administered 2020-07-27: 100 mg via INTRAVENOUS

## 2020-07-27 MED ORDER — LABETALOL HCL 5 MG/ML IV SOLN
20.0000 mg | Freq: Once | INTRAVENOUS | Status: DC
  Filled 2020-07-27: qty 4

## 2020-07-27 MED ORDER — AMIODARONE LOAD VIA INFUSION
150.0000 mg | Freq: Once | INTRAVENOUS | Status: DC
  Filled 2020-07-27: qty 83.34

## 2020-07-27 MED ORDER — DOCUSATE SODIUM 50 MG/5ML PO LIQD: 100.0000 mg | Freq: Two times a day (BID) | ORAL | Status: DC

## 2020-07-27 MED ORDER — VITAMIN D 25 MCG (1000 UNIT) PO TABS: 2000.0000 [IU] | ORAL_TABLET | Freq: Every day | ORAL | Status: DC

## 2020-07-27 MED ORDER — ORAL CARE MOUTH RINSE
15.0000 mL | OROMUCOSAL | Status: DC
  Administered 2020-07-27 – 2020-07-28 (×7): 15 mL via OROMUCOSAL

## 2020-07-27 MED ORDER — CLEVIDIPINE BUTYRATE 0.5 MG/ML IV EMUL
0.0000 mg/h | INTRAVENOUS | Status: DC
  Filled 2020-07-27: qty 50

## 2020-07-27 MED ORDER — SODIUM CHLORIDE 0.9 % IV SOLN: 50.0000 mL | Freq: Once | INTRAVENOUS | Status: DC

## 2020-07-27 MED ORDER — SENNOSIDES-DOCUSATE SODIUM 8.6-50 MG PO TABS: 1.0000 | ORAL_TABLET | Freq: Every evening | ORAL | Status: DC | PRN

## 2020-07-27 MED ORDER — FENTANYL CITRATE (PF) 250 MCG/5ML IJ SOLN
INTRAMUSCULAR | Status: DC | PRN
  Administered 2020-07-27: 100 ug via INTRAVENOUS

## 2020-07-27 MED ORDER — LABETALOL HCL 5 MG/ML IV SOLN: 20.0000 mg | Freq: Once | INTRAVENOUS | Status: DC

## 2020-07-27 MED ORDER — ALTEPLASE 100 MG IV SOLR
INTRAVENOUS | Status: AC
  Filled 2020-07-27: qty 100

## 2020-07-27 MED ORDER — LABETALOL HCL 5 MG/ML IV SOLN
INTRAVENOUS | Status: DC | PRN
  Administered 2020-07-27: 5 mg via INTRAVENOUS

## 2020-07-27 MED ORDER — ALTEPLASE (STROKE) FULL DOSE INFUSION
0.9000 mg/kg | Freq: Once | INTRAVENOUS | Status: AC
  Administered 2020-07-27: 89.7 mg via INTRAVENOUS
  Filled 2020-07-27: qty 100

## 2020-07-27 MED ORDER — SODIUM CHLORIDE 0.9 % IV SOLN: INTRAVENOUS | Status: DC | PRN

## 2020-07-27 MED ORDER — STROKE: EARLY STAGES OF RECOVERY BOOK: Freq: Once | Status: DC

## 2020-07-27 MED ORDER — AMIODARONE HCL IN DEXTROSE 360-4.14 MG/200ML-% IV SOLN: 60.0000 mg/h | INTRAVENOUS | Status: DC

## 2020-07-27 MED ORDER — ALTEPLASE (STROKE) FULL DOSE INFUSION: 90.0000 mg | Freq: Once | INTRAVENOUS | Status: DC

## 2020-07-27 MED ORDER — POLYETHYLENE GLYCOL 3350 17 G PO PACK: 17.0000 g | PACK | Freq: Every day | ORAL | Status: DC

## 2020-07-27 MED ORDER — EPTIFIBATIDE 20 MG/10ML IV SOLN
INTRAVENOUS | Status: AC
  Filled 2020-07-27: qty 10

## 2020-07-27 MED ORDER — PROPOFOL 500 MG/50ML IV EMUL
INTRAVENOUS | Status: DC | PRN
  Administered 2020-07-27: 50 ug/kg/min via INTRAVENOUS

## 2020-07-27 MED ORDER — ACETAMINOPHEN 325 MG PO TABS: 650.0000 mg | ORAL_TABLET | Freq: Four times a day (QID) | ORAL | Status: DC | PRN

## 2020-07-27 MED ORDER — PANTOPRAZOLE SODIUM 40 MG PO TBEC: 40.0000 mg | DELAYED_RELEASE_TABLET | Freq: Every day | ORAL | Status: DC

## 2020-07-27 MED ORDER — TICAGRELOR 90 MG PO TABS
ORAL_TABLET | ORAL | Status: AC
  Filled 2020-07-27: qty 2

## 2020-07-27 MED ORDER — SODIUM CHLORIDE 0.9 % IV SOLN: INTRAVENOUS | Status: DC

## 2020-07-27 MED ORDER — NITROGLYCERIN 1 MG/10 ML FOR IR/CATH LAB
INTRA_ARTERIAL | Status: DC | PRN
  Administered 2020-07-27 (×2): 25 ug via INTRA_ARTERIAL

## 2020-07-27 MED ORDER — CANGRELOR TETRASODIUM 50 MG IV SOLR
INTRAVENOUS | Status: AC
  Filled 2020-07-27: qty 50

## 2020-07-27 MED ORDER — AMIODARONE HCL IN DEXTROSE 360-4.14 MG/200ML-% IV SOLN: 30.0000 mg/h | INTRAVENOUS | Status: DC

## 2020-07-27 MED ORDER — CLEVIDIPINE BUTYRATE 0.5 MG/ML IV EMUL: 0.0000 mg/h | INTRAVENOUS | Status: DC

## 2020-07-27 MED ORDER — SUCCINYLCHOLINE CHLORIDE 200 MG/10ML IV SOSY
PREFILLED_SYRINGE | INTRAVENOUS | Status: DC | PRN
  Administered 2020-07-27: 140 mg via INTRAVENOUS

## 2020-07-27 MED ORDER — IOHEXOL 240 MG/ML SOLN
INTRAMUSCULAR | Status: AC
  Filled 2020-07-27: qty 200

## 2020-07-27 MED ORDER — ALBUTEROL SULFATE (2.5 MG/3ML) 0.083% IN NEBU: 2.5000 mg | INHALATION_SOLUTION | RESPIRATORY_TRACT | Status: DC | PRN

## 2020-07-27 MED ORDER — CARVEDILOL 3.125 MG PO TABS: 6.2500 mg | ORAL_TABLET | Freq: Two times a day (BID) | ORAL | Status: DC

## 2020-07-27 MED ORDER — VERAPAMIL HCL 2.5 MG/ML IV SOLN
INTRAVENOUS | Status: DC | PRN
  Administered 2020-07-27 (×2): 2.5 mg via INTRAVENOUS

## 2020-07-27 MED ORDER — TIROFIBAN HCL IN NACL 5-0.9 MG/100ML-% IV SOLN
INTRAVENOUS | Status: AC
  Filled 2020-07-27: qty 100

## 2020-07-27 MED ORDER — CLEVIDIPINE BUTYRATE 0.5 MG/ML IV EMUL
0.0000 mg/h | INTRAVENOUS | Status: DC
  Administered 2020-07-27: 6 mg/h via INTRAVENOUS
  Administered 2020-07-27: 12 mg/h via INTRAVENOUS
  Administered 2020-07-27: 5 mg/h via INTRAVENOUS
  Administered 2020-07-28: 7 mg/h via INTRAVENOUS
  Filled 2020-07-27 (×5): qty 50

## 2020-07-27 MED ORDER — CHLORHEXIDINE GLUCONATE CLOTH 2 % EX PADS
6.0000 | MEDICATED_PAD | Freq: Every day | CUTANEOUS | Status: DC
  Administered 2020-07-27: 6 via TOPICAL

## 2020-07-27 MED ORDER — ALTEPLASE (STROKE) FULL DOSE INFUSION
90.0000 mg | Freq: Once | INTRAVENOUS | Status: DC
  Filled 2020-07-27: qty 100

## 2020-07-27 MED ORDER — CEFAZOLIN SODIUM-DEXTROSE 2-3 GM-%(50ML) IV SOLR
INTRAVENOUS | Status: DC | PRN
  Administered 2020-07-27: 2 g via INTRAVENOUS

## 2020-07-27 MED ORDER — PANTOPRAZOLE SODIUM 40 MG PO PACK: 40.0000 mg | PACK | Freq: Every day | ORAL | Status: DC

## 2020-07-27 MED ORDER — ACETAMINOPHEN 650 MG RE SUPP: 650.0000 mg | RECTAL | Status: DC | PRN

## 2020-07-27 MED ORDER — PROPOFOL 10 MG/ML IV BOLUS
INTRAVENOUS | Status: DC | PRN
  Administered 2020-07-27: 100 mg via INTRAVENOUS

## 2020-07-27 MED ORDER — ACETAMINOPHEN 160 MG/5ML PO SOLN: 650.0000 mg | ORAL | Status: DC | PRN

## 2020-07-27 MED ORDER — ROCURONIUM BROMIDE 10 MG/ML (PF) SYRINGE
PREFILLED_SYRINGE | INTRAVENOUS | Status: DC | PRN
  Administered 2020-07-27 (×2): 50 mg via INTRAVENOUS

## 2020-07-27 MED ORDER — VITAMIN B-12 1000 MCG PO TABS: 1000.0000 ug | ORAL_TABLET | Freq: Every day | ORAL | Status: DC

## 2020-07-27 MED ORDER — ACETAMINOPHEN 325 MG PO TABS: 650.0000 mg | ORAL_TABLET | ORAL | Status: DC | PRN

## 2020-07-27 MED ORDER — SODIUM CHLORIDE (PF) 0.9 % IJ SOLN
INTRAVENOUS | Status: DC | PRN
  Administered 2020-07-27: 25 ug via INTRA_ARTERIAL

## 2020-07-27 MED ORDER — LABETALOL HCL 5 MG/ML IV SOLN
INTRAVENOUS | Status: AC
  Administered 2020-07-27: 20 mg
  Filled 2020-07-27: qty 4

## 2020-07-27 MED ORDER — IOHEXOL 300 MG/ML  SOLN
150.0000 mL | Freq: Once | INTRAMUSCULAR | Status: AC | PRN
  Administered 2020-07-27: 60 mL via INTRA_ARTERIAL

## 2020-07-27 MED ORDER — PROPOFOL 1000 MG/100ML IV EMUL
0.0000 ug/kg/min | INTRAVENOUS | Status: DC
  Administered 2020-07-27: 50 ug/kg/min via INTRAVENOUS
  Administered 2020-07-28: 25 ug/kg/min via INTRAVENOUS
  Filled 2020-07-27 (×2): qty 100

## 2020-07-27 MED ORDER — ALTEPLASE (STROKE) FULL DOSE INFUSION: 0.9000 mg/kg | Freq: Once | INTRAVENOUS | Status: DC

## 2020-07-27 MED ORDER — CLOPIDOGREL BISULFATE 300 MG PO TABS
ORAL_TABLET | ORAL | Status: AC
  Filled 2020-07-27: qty 1

## 2020-07-27 MED ORDER — CHLORHEXIDINE GLUCONATE 0.12% ORAL RINSE (MEDLINE KIT)
15.0000 mL | Freq: Two times a day (BID) | OROMUCOSAL | Status: DC
  Administered 2020-07-27 – 2020-07-28 (×2): 15 mL via OROMUCOSAL

## 2020-07-27 MED ORDER — FENTANYL CITRATE (PF) 100 MCG/2ML IJ SOLN: 25.0000 ug | INTRAMUSCULAR | Status: DC | PRN

## 2020-07-27 MED ORDER — INSULIN ASPART 100 UNIT/ML ~~LOC~~ SOLN
0.0000 [IU] | SUBCUTANEOUS | Status: DC
  Administered 2020-07-27 – 2020-07-28 (×2): 2 [IU] via SUBCUTANEOUS
  Administered 2020-07-28: 3 [IU] via SUBCUTANEOUS
  Administered 2020-07-28: 2 [IU] via SUBCUTANEOUS

## 2020-07-27 MED ORDER — VERAPAMIL HCL 2.5 MG/ML IV SOLN
INTRAVENOUS | Status: AC
  Filled 2020-07-27: qty 2

## 2020-07-27 MED ORDER — ASPIRIN 81 MG PO CHEW
CHEWABLE_TABLET | ORAL | Status: AC
  Filled 2020-07-27: qty 1

## 2020-07-27 MED ORDER — NICARDIPINE HCL IN NACL 20-0.86 MG/200ML-% IV SOLN
INTRAVENOUS | Status: DC | PRN
  Administered 2020-07-27: 5 mg/h via INTRAVENOUS

## 2020-07-27 MED ORDER — IOHEXOL 350 MG/ML SOLN
100.0000 mL | Freq: Once | INTRAVENOUS | Status: AC | PRN
  Administered 2020-07-27: 75 mL via INTRAVENOUS

## 2020-07-27 NOTE — ED Notes (Signed)
TPA rate of 64ml started

## 2020-07-27 NOTE — Progress Notes (Signed)
Spoke with Dr. Estanislado Pandy for clarification on BP parameters. MAP <60 when SBP is <140. Per Dr. Estanislado Pandy keep SBP around 140, it is ok as long as SBP is below 150.

## 2020-07-27 NOTE — Procedures (Signed)
S/P Lt common carotid arteriogram. RT CFA approach. S/P revascularization of occluded Lt  MCA and Lt  ICA with  1 pass with 33mmx 40 mm solitaire  Retriever with aspiration achieving a TICI 2C revascularization. Post CT demonstrates cortical hyperemia of the Lt MCA distribution with  A small Lt sylvian SAH. RT groin sheath left in place because of INR of 2.7 and TPA.  Patient left intubated to protect airway. Distal pulses all dopplerable. S.Alleya Demeter MD

## 2020-07-27 NOTE — Anesthesia Procedure Notes (Signed)
Arterial Line Insertion Start/End11/04/2020 4:50 PM, 07/27/2020 5:00 PM Performed by: Teressa Lower., CRNA, CRNA  Preanesthetic checklist: patient identified, IV checked, site marked, risks and benefits discussed, surgical consent, monitors and equipment checked, pre-op evaluation, timeout performed and anesthesia consent Left, radial was placed Catheter size: 20 G Hand hygiene performed , maximum sterile barriers used  and Seldinger technique used Allen's test indicative of satisfactory collateral circulation Attempts: 2 Procedure performed using ultrasound guided technique. Ultrasound Notes:anatomy identified, needle tip was noted to be adjacent to the nerve/plexus identified and no ultrasound evidence of intravascular and/or intraneural injection Following insertion, Biopatch and dressing applied. Post procedure assessment: normal  Patient tolerated the procedure well with no immediate complications.

## 2020-07-27 NOTE — Progress Notes (Signed)
[facility] Progress Note Patient Name: Cassandra Parker DOB: [birth year] MRN: [id number]   Date of Service  08/22/2020  HPI/Events of Note  Hyperglycemia - Blood glucose = 238. CKD - stage V - Creatinine = 4.32.  eICU Interventions  Plan: 1. Q 4 hour very sensitive Novolog SSI.     Intervention Category Major Interventions: Hyperglycemia - active titration of insulin therapy  Lysle Dingwall 08/23/2020, 8:59 PM

## 2020-07-27 NOTE — Anesthesia Procedure Notes (Signed)
Procedure Name: Intubation Date/Time: 08/14/2020 5:24 PM Performed by: Teressa Lower., CRNA Pre-anesthesia Checklist: Patient identified, Emergency Drugs available, Suction available and Patient being monitored Patient Re-evaluated:Patient Re-evaluated prior to induction Oxygen Delivery Method: Circle system utilized Preoxygenation: Pre-oxygenation with 100% oxygen Induction Type: IV induction, Rapid sequence and Cricoid Pressure applied Laryngoscope Size: Glidescope and 3 Grade View: Grade I Tube type: Oral Tube size: 7.5 mm Number of attempts: 1 Airway Equipment and Method: Stylet and Video-laryngoscopy Placement Confirmation: ETT inserted through vocal cords under direct vision,  positive ETCO2 and breath sounds checked- equal and bilateral Secured at: 22 cm Tube secured with: Tape Dental Injury: Teeth and Oropharynx as per pre-operative assessment

## 2020-07-27 NOTE — ED Triage Notes (Signed)
EMS states pt went to the bathroom, family went to check on pt and found pt altered, unable to talk or move right side.   LKW 1307. EDP at stretcher. CT called for code stroke

## 2020-07-27 NOTE — Progress Notes (Signed)
Patient ID: Cassandra Parker, female   DOB: April 09, 1941, 79 y.o.   MRN: [id number] INR. 6 Y RT H  F MRS 1? LSW 12pm. New onset of Lt gaze preference,RT hemiplegia . CT brain No ICH . Hyperdense LT MCA sign. ASPECTS 9. CTA occluded Lt ICA terminus ,Lt ACA and Lt MCA and Lt ICA extracranially TPA initiated. Option of endovascular treatment  D/W daughter. Reasons,procedure alternatives reviewed. Risks of ICH of 10 % worsening neuro function,death and inability to revascularize reviewed. Daughter expressed understanding and provided witnessed consent to proceed. S.Malasha Kleppe MD

## 2020-07-27 NOTE — Code Documentation (Addendum)
Stroke Response Nurse Documentation Code Documentation  Cassandra Parker is a 79 y.o. female arriving to [place] ED via [facility] on 11/2 with past medical hx of breast cancer, cardiomyopathy, complete heart block, CHF, CKD, mitral stenosis. Code stroke was activated at [facility]. Patient from [facility] where she was LKW at 1200 and now complaining of right sided weakness, left gaze, aphasia. Given alteplase at [facility] and completed prior to arrival.   Stroke team at the bedside on patient arrival. Labs drawn and patient cleared to go to IR. Patient to CT with team. NIHSS 24, see documentation for details and code stroke times. Patient with decreased LOC, disoriented, not following commands, left gaze preference , right hemianopia, right facial droop, right arm weakness, bilateral leg weakness, Global aphasia  and dysarthria  on exam.  Pt taken straight to IR. Intubated and transferred to IR Suite. Report given to Marylyn Ishihara, RN     Pearlie Oyster  Stroke Response RN

## 2020-07-27 NOTE — ED Notes (Signed)
Code Stroke paged out. CT ready.

## 2020-07-27 NOTE — Anesthesia Preprocedure Evaluation (Addendum)
Anesthesia Evaluation  Patient identified by MRN, date of birth, ID band Patient awake    Reviewed: Allergy & Precautions, NPO status , Patient's Chart, lab work & pertinent test results  Airway Mallampati: I  TM Distance: >3 FB Neck ROM: Full    Dental   Pulmonary    Pulmonary exam normal        Cardiovascular hypertension, Pt. on medications Normal cardiovascular exam+ pacemaker + Valvular Problems/Murmurs MR      Neuro/Psych CVA    GI/Hepatic   Endo/Other  diabetes, Type 2  Renal/GU      Musculoskeletal   Abdominal   Peds  Hematology   Anesthesia Other Findings   Reproductive/Obstetrics                            Anesthesia Physical Anesthesia Plan  ASA: III and emergent  Anesthesia Plan: General   Post-op Pain Management:    Induction: Intravenous, Rapid sequence and Cricoid pressure planned  PONV Risk Score and Plan: 3 and Ondansetron, Midazolam and Treatment may vary due to age or medical condition  Airway Management Planned: Oral ETT  Additional Equipment: Arterial line  Intra-op Plan:   Post-operative Plan: Possible Post-op intubation/ventilation  Informed Consent: I have reviewed the patients History and Physical, chart, labs and discussed the procedure including the risks, benefits and alternatives for the proposed anesthesia with the patient or authorized representative who has indicated his/her understanding and acceptance.       Plan Discussed with: CRNA and Surgeon  Anesthesia Plan Comments:         Anesthesia Quick Evaluation

## 2020-07-27 NOTE — ED Notes (Signed)
[facility] here to take pt, report given, TPA still running.  [facility] takes over care of the pt

## 2020-07-27 NOTE — ED Notes (Signed)
TPA bolus of 9 cc given

## 2020-07-27 NOTE — Sedation Documentation (Signed)
Spoke with bed control. Patient going to 4N19.

## 2020-07-27 NOTE — H&P (Signed)
Neurology H&P Reason for Admission: Transfer for IA for L carotid occlusion  CC: altered mental status and right sided weakness  History is obtained from: Chart review   HPI: Cassandra Parker is a 79 y.o. female with a past medical history significant for paroxysmal atrial fibrillation off of Eliquis due to recent GI bleed in the setting of recent hospitalization for pneumonia, end-stage renal disease on intermittent hemodialysis with recent fistula placement in the right upper extremity, hypertension, hyperlipidemia, diabetes (A1c 7.4% on 05/22/2020), cardiomyopathy (EF 60-65% w/ G1dd on 05/23/20) status post pacemaker, mitral stenosis and mild MR, and breast cancer (status post chemo and right mastectomy in 1998).   Please see excellent note by Dr. Amie Portland detailing her presentation at any point hospital as a code stroke. Of note, the patient's initial stroke labs were not collected at [facility] and there was therefore a delay in obtaining these data.  Post TPA labs are notable for potassium of 5.4, glucose in the 300s, BUN of 94, creatinine of 4.32, anion gap of 16, and a CBC notable for mild anemia with hemoglobin of 11.5 with increased RDW at 16. Notably PT was elevated at 27.9, INR elevated at 2.7, and PTT elevated at 146  LKW: 12 PM tPA given?: No, or if yes, time given 3:03 PM IA performed?: Yes Premorbid modified rankin scale: 1 - No significant disability. Able to carry out all usual activities, despite some symptoms.  NIH stroke scale (Dr. Rory Percy eval --> my eval on arrival to [facility] ED)  1A: Level of Consciousness - 0  -> 2  1B: Ask Month and Age - 2 --> 2  1C: 'Blink Eyes' & 'Squeeze Hands' - 2 --> 2  2: Test Horizontal Extraocular Movements - 1 --> 1  3: Test Visual Fields - 2 --> 2  4: Test Facial Palsy - 2 --> 2  5A: Test Left Arm Motor Drift - 0 5B: Test Right Arm Motor Drift - 4 --> 3  6A: Test Left Leg Motor Drift - 1 --> 3  6B: Test Right Leg Motor Drift - 3  --> 3  7: Test  Limb Ataxia - 0 --> 0  8: Test Sensation - 2  --> 0 9: Test Language/Aphasia- 3 --> 3 10: Test Dysarthria - 2 --> 2 11: Test Extinction/Inattention - 0 NIHSS score:24  ROS: Unable to obtain due to altered mental status.   Past Medical History:  Diagnosis Date  . Acute renal insufficiency 10/15/2013  . Arthritis   . Cancer [facility]) 2008   right breast  . Cancer of breast- s/p chemo and Rt mastectomy 2008 10/13/2013  . Cardiomyopathy ([place])    dating back to 2010  . CHB (complete heart block) ([place]) 10/13/2013  . Chronic combined systolic and diastolic congestive heart failure ([place])   . Diabetes mellitus    with neuropathy  . Diabetes mellitus type 2, controlled, without complications ([place]) 8/67/6720  . Dyslipidemia 10/13/2013  . History of blood transfusion   . Hypercholesterolemia   . Hypertension   . Mitral stenosis- moderate with trivial MR  10/15/2013  . Pacemaker 10/23/2014  . Pneumonia   . Presence of permanent cardiac pacemaker    St. Jude  . Primary osteoarthritis of left knee 11/04/2015  . Syncope 10/13/2013  . Tingling    feet bilat     Family History  Problem Relation Age of Onset  . Hypertension Father   . Diabetes Father   . Anesthesia problems Neg Hx   .  Hypotension Neg Hx   . Malignant hyperthermia Neg Hx   . Pseudochol deficiency Neg Hx   . Colon cancer Neg Hx   . Colon polyps Neg Hx     Social History:  reports that she has never smoked. She has never used smokeless tobacco. She reports that she does not drink alcohol and does not use drugs.  Exam: Current vital signs: BP (!) 161/68   Pulse 71   Resp 20   Wt 99.7 kg   SpO2 100%   BMI 38.95 kg/m  Vital signs in last 24 hours: Pulse Rate:  [66-83] 71 (11/02 1530) Resp:  [20-26] 20 (11/02 1530) BP: (161)/(68-136) 161/68 (11/02 1521) SpO2:  [86 %-100 %] 100 % (11/02 1530) Weight:  [99.7 kg] 99.7 kg (11/02 1450)   Physical Exam  Constitutional: Ill-appearing Psych: Minimally interactive Eyes: No  scleral injection HENT: No OP obstruction, removable dentures initially in place MSK: no joint deformities.  Cardiovascular: Normal rate and regular rhythm.  Respiratory: Appears somewhat short of breath GI: Soft.  No distension.  Markedly obese, no tenderness Skin: Diffuse pitting edema throughout lower extremities  Neuro: Mental Status: Patient is very sleepy requiring extended noxious stimulation to arouse.  She will attend the examiner on the right side only and cross a little bit past midline but will not go fully towards the left.  She intermittently follows some of the examination with gestures/coaching but does not follow any commands.  She has no usable verbal output but does make a grunting noise when asked her name Cranial Nerves: II: Visual Fields are full. Pupils are equal, round, and reactive to light.  3 to 1 mm III,IV, VI: EOM with a strong right gaze preference V: Facial sensation is symmetric to eyelash brush VII: Facial movement is notable for a right facial droop.  VIII: hearing is intact to voice  Motor: Tone is low on the right. Bulk is normal.  Patient withdraws in the right upper extremity, triple flexes versus has a slight complex movement in the right lower extremity, moves the left lower extremity more briskly than the right but is not antigravity, can maintain her left upper extremity antigravity for greater than 5 seconds Sensory: Grossly she appears to be reactive to touch in all 4 extremities roughly equally within limits of her weakness Deep Tendon Reflexes: 2+ and symmetric in the biceps and patellae. Plantars: Toes are downgoing bilaterally.     I have reviewed labs in epic and the results pertinent to this consultation are: As listed above in HPI  I have reviewed the images obtained:  Dry head CT with hyperdense vessel sign, degraded by patient motion but without an intracranial hemorrhage CTA with the left carotid artery occlusion   Impression:  This is a woman with multiple stroke risk factors as detailed above presenting with a likely cardioembolic event to her left internal carotid artery given her known atrial fibrillation off of anticoagulation.  Recommendations:  # L MCA stroke secondary to left carotid occlusion, atheroembolic vs. cardioembolic   - Stroke labs [id number], fasting lipid panel - MRI brain; MRA of the brain without contrast cannot be completed secondary to pacemaker, so will plan on follow-up head CT with timing to be determined based on clinical exam postprocedure - Frequent neuro checks, ICU given post tPA/IA - Echocardiogram - Prophylactic therapy-Antiplatelet med: Aspirin - dose 325mg  PO or 300mg  PR, followed by 81 mg daily - Consider Plavix 300 mg load with 75 mg daily for 21 -  90 day course, unless indication for anticoagulation is discovered  - Risk factor modification  - increase statin as needed to meet goal LDL < 70 - Telemetry monitoring; 30 day event monitor on discharge if no arrythmias captured  - Blood pressure goal   - Post tPA for 24  hours < 180/105  - Post successful uncomplicated revascularization SBP 120 - 140 for 24 hours; if complications have arisen or only partial revascularization reach out to interventionalist or neurologist on call for BP goal - PT consult, OT consult, Speech consult, unless patient is back to baseline - Stroke team to follow  # Chronic pain  - Holding home norco 5-325 q6hr PRN  # HTN - BP goal as above - Holding home amlodipine 10 mg daily, imdur 30 mg daily, and Coreg 6.25 twice daily while patient is being managed with antihypertensive drips  # CHF - Lasix per CCM (home dose 40 mg daily); hold home KCl given hyperkalemia on admission  # DM - management per CCM, goal normal natremia  # Chronic meds - continue B12, Protonix, Vit D,  - continue albuterol PRN  # ESRD on iHD  -Appreciate nephrology consideration of CVVHD temporarily depending on the final size  of her stroke, to minimize edema from fluid shifts    Lesleigh Noe MD-PhD [facility] [phone]

## 2020-07-27 NOTE — Progress Notes (Deleted)
[facility] Progress Note Patient Name: Cassandra Parker DOB: 1941/05/21 MRN: [id number]   Date of Service  08/08/2020  HPI/Events of Note  AFIB with RVR - Ventricular rate = 137.  Currently not on any rate control agent. BP = 109/45 with MAP = 70. Last K+ at 4:41 PM = 5.4.  eICU Interventions  Plan: 1. Amiodarone IV load and infusion.  2. Mg++ level STAT.      Intervention Category Major Interventions: Arrhythmia - evaluation and management  Markel Mergenthaler [place] 08/15/2020, 9:06 PM

## 2020-07-27 NOTE — Progress Notes (Signed)
Patient has a St.Jude pacemaker implanted that is not MRI safe. She cannot have a MRI exam. MD aware.

## 2020-07-27 NOTE — Transfer of Care (Signed)
Immediate Anesthesia Transfer of Care Note  Patient: Cassandra Parker  Procedure(s) Performed: IR WITH ANESTHESIA (N/A )  Patient Location: NICU  Anesthesia Type:General  Level of Consciousness: Patient remains intubated per anesthesia plan  Airway & Oxygen Therapy: Patient remains intubated per anesthesia plan and Patient placed on Ventilator (see vital sign flow sheet for setting)  Post-op Assessment: Report given to RN and Post -op Vital signs reviewed and stable  Post vital signs: Reviewed and stable  Last Vitals:  Vitals Value Taken Time  BP    Temp    Pulse    Resp    SpO2 100 % 08/05/2020 1820    Last Pain: There were no vitals filed for this visit.       Complications: No complications documented.

## 2020-07-27 NOTE — Consult Note (Signed)
NAME:  Cassandra Parker, MRN:  [id number], DOB:  11/16/1940, LOS: 0 ADMISSION DATE:  08/03/2020, CONSULTATION DATE:  08/20/2020 REFERRING MD:  devashwar, CHIEF COMPLAINT:  Post operative resp insuff  Brief History   79 yo with acute R weakness and inability to talk. Found to have L MCA occlusion taken for thrombectomy  History of present illness   79 yo black female with pmh breast cancer, cm, chb s/p ppm, chronic hf (systolic/diastolic), htn, hyperlipidemia, ckd5 with RUE fistula in place (not on dialysis) who presented with last known normal of 1200 today and found with sudden onsiet of R sided weakness and inability to talk. She was transferred from [facility] after tele neuro recommended thrombectomy.   Pt underwent emergency intervention with revascularization. She arrives to our ICU intuabted and sedated for which we have been consulted. SBP goal 120-140.   Past Medical History   Past Medical History:  Diagnosis Date  . Acute renal insufficiency 10/15/2013  . Arthritis   . Cancer [facility]) 2008   right breast  . Cancer of breast- s/p chemo and Rt mastectomy 2008 10/13/2013  . Cardiomyopathy ([place])    dating back to 2010  . CHB (complete heart block) ([place]) 10/13/2013  . Chronic combined systolic and diastolic congestive heart failure ([place])   . Diabetes mellitus    with neuropathy  . Diabetes mellitus type 2, controlled, without complications ([place]) 01/01/1447  . Dyslipidemia 10/13/2013  . History of blood transfusion   . Hypercholesterolemia   . Hypertension   . Mitral stenosis- moderate with trivial MR  10/15/2013  . Pacemaker 10/23/2014  . Pneumonia   . Presence of permanent cardiac pacemaker    [employer]  . Primary osteoarthritis of left knee 11/04/2015  . Syncope 10/13/2013  . Tingling    feet bilat     Significant Hospital Events     Consults:  Ccm 11/2  Procedures:  11/2: L MCA thrombectomy  Significant Diagnostic Tests:  11/2 cta head and neck:1. Positive for Emergent Large  Vessel Occlusion: Left ICA T-occlusion. LICA occluded in the neck and at the terminus along with the Left MCA and ACA origins. The left ACA is reconstituted but the MCA is not. This was discussed by telephone with Dr. Elnora Morrison on 08/01/2020 at 15:05 .  2. Underlying mild for age atherosclerosis. Calcified plaque most notable in both ICA siphons.  3. Left chest pacemaker.  Micro Data:    Antimicrobials:    Interim history/subjective:    Objective   Blood pressure (!) 161/68, pulse 71, resp. rate 20, weight 99.7 kg, SpO2 100 %.    Vent Mode: PRVC FiO2 (%):  [50 %] 50 % Set Rate:  [18 bmp] 18 bmp Vt Set:  [410 mL] 410 mL PEEP:  [5 cmH20] 5 cmH20 Plateau Pressure:  [18 cmH20] 18 cmH20  No intake or output data in the 24 hours ending 08/16/2020 1826 Filed Weights   08/09/2020 1450  Weight: 99.7 kg    Examination: General: no acute distress, sedated intubated HEENT: NCAT,Pupils pinpoint and sluggish, MMMP Lungs: CTA, no wheezes, rhonchi, rales Cardiovascular: RRR, no m/g/r Abdomen: soft, NT,ND, BS+ Extremities: + edema, RUE fistula in place Skin: no rashes, warm and dry Neuro: sedated on vent GU: deferred  Resolved Hospital Problem list     Assessment & Plan:  Acute L MCA CVA:  -s/p thrombectomy -post intervention per primary -goal sbp 120-140, maintain with pressors/cleviprex as needed.   Post operative resp failure:  -titrate vent -  sat/sbt when ok with neuro   Acute on ckd5:  -does have fistula in RUE -will consult likely need nephro consult in am.   CHB s/p ppm Chronic combined systolic/diastolic hf (without acute exacerbation) -echo per stroke protocol dm2 -glucose monitoring -check a1c htn -home meds on hold -monitor closely -goal sbp post intervention 120-140 hypercholesterolemia   Best practice:  Diet: NPO Pain/Anxiety/Delirium protocol (if indicated): per protocol VAP protocol (if indicated): per protocol DVT prophylaxis: per  primary GI prophylaxis: ppi Glucose control: monitor Mobility: bedrest for now Code Status: FULL Family Communication: per family Disposition: ICU  Labs   CBC: Recent Labs  Lab 08/12/2020 1641  WBC 10.4  NEUTROABS 8.2*  HGB 11.5*  HCT 37.8  MCV 96.9  PLT 045    Basic Metabolic Panel: Recent Labs  Lab 08/10/2020 1641  NA 140  K 5.4*  CL 107  CO2 17*  GLUCOSE 336*  BUN 94*  CREATININE 4.32*  CALCIUM 8.6*   GFR: Estimated Creatinine Clearance: 12.5 mL/min (A) (by C-G formula based on SCr of 4.32 mg/dL (H)). Recent Labs  Lab 07/29/2020 1641  WBC 10.4    Liver Function Tests: Recent Labs  Lab 08/20/2020 1641  AST 27  ALT 24  ALKPHOS 64  BILITOT 0.8  PROT 6.7  ALBUMIN 3.4*   No results for input(s): LIPASE, AMYLASE in the last 168 hours. No results for input(s): AMMONIA in the last 168 hours.  ABG    Component Value Date/Time   TCO2 22 06/28/2020 0823     Coagulation Profile: Recent Labs  Lab 08/05/2020 1655  INR 2.7*    Cardiac Enzymes: No results for input(s): CKTOTAL, CKMB, CKMBINDEX, TROPONINI in the last 168 hours.  HbA1C: Hgb A1c MFr Bld  Date/Time Value Ref Range Status  05/22/2020 11:15 AM 7.4 (H) 4.8 - 5.6 % Final    Comment:    (NOTE) Pre diabetes:          5.7%-6.4%  Diabetes:              >6.4%  Glycemic control for   <7.0% adults with diabetes   10/13/2013 08:00 PM 6.6 (H) <5.7 % Final    Comment:    (NOTE)                                                                       According to the ADA Clinical Practice Recommendations for 2011, when HbA1c is used as a screening test:  >=6.5%   Diagnostic of Diabetes Mellitus           (if abnormal result is confirmed) 5.7-6.4%   Increased risk of developing Diabetes Mellitus References:Diagnosis and Classification of Diabetes Mellitus,Diabetes WUJW,1191,47(WGNFA 1):S62-S69 and Standards of Medical Care in         Diabetes - 2011,Diabetes Care,2011,34 (Suppl 1):S11-S61.     CBG: Recent Labs  Lab 07/27/20 1459  GLUCAP 366*    Review of Systems:   Unobtainable 2/2 intubation status  Past Medical History  She,  has a past medical history of Acute renal insufficiency (10/15/2013), Arthritis, Cancer ([place]) (2008), Cancer of breast- s/p chemo and Rt mastectomy 2008 (10/13/2013), Cardiomyopathy ([place]), CHB (complete heart block) ([place]) (10/13/2013), Chronic combined systolic and diastolic congestive heart  failure ([place]), Diabetes mellitus, Diabetes mellitus type 2, controlled, without complications ([place]) (3/66/4403), Dyslipidemia (10/13/2013), History of blood transfusion, Hypercholesterolemia, Hypertension, Mitral stenosis- moderate with trivial MR  (10/15/2013), Pacemaker (10/23/2014), Pneumonia, Presence of permanent cardiac pacemaker, Primary osteoarthritis of left knee (11/04/2015), Syncope (10/13/2013), and Tingling.   Surgical History    Past Surgical History:  Procedure Laterality Date  . APPENDECTOMY     mmh  . AV FISTULA PLACEMENT Right 05/18/2020   Procedure: RIGHT ARM BRACHIOCEPHALIC ARTERIOVENOUS (AV) FISTULA CREATION;  Surgeon: Elam Dutch, MD;  Location: [facility];  Service: Vascular;  Laterality: Right;  . BI-VENTRICULAR PACEMAKER INSERTION N/A 10/15/2013   Procedure: BI-VENTRICULAR PACEMAKER INSERTION (CRT-P);  Surgeon: Evans Lance, MD;  Location: [facility] CATH LAB;  Service: Cardiovascular;  Laterality: N/A;  . Cardiomyopathy    . CATARACT EXTRACTION W/PHACO  06/29/2011   Procedure: CATARACT EXTRACTION PHACO AND INTRAOCULAR LENS PLACEMENT (IOC);  Surgeon: Tonny Branch;  Location: [facility];  Service: Ophthalmology;  Laterality: Right;  CDE: 14.41  . CATARACT EXTRACTION W/PHACO  09/04/2011   Procedure: CATARACT EXTRACTION PHACO AND INTRAOCULAR LENS PLACEMENT (IOC);  Surgeon: Tonny Branch;  Location: [facility];  Service: Ophthalmology;  Laterality: Left;  CDE: 15.21  . CHOLECYSTECTOMY     mmh  . FISTULA SUPERFICIALIZATION Right 06/28/2020   Procedure:  SUPERFICIALIZATION RIGHT ARM FISTULA;  Surgeon: Elam Dutch, MD;  Location: [facility] OR;  Service: Vascular;  Laterality: Right;  . JOINT REPLACEMENT Left    knee  . KNEE ARTHROSCOPY     right-mmh  . MASTECTOMY  2008    mmh-right  . TEMPORARY PACEMAKER INSERTION N/A 10/13/2013   Procedure: TEMPORARY PACEMAKER INSERTION;  Surgeon: Jolaine Artist, MD;  Location: [facility] CATH LAB;  Service: Cardiovascular;  Laterality: N/A;  . TOTAL KNEE ARTHROPLASTY Left 11/04/2015   Procedure: LEFT TOTAL KNEE ARTHROPLASTY;  Surgeon: Rod Can, MD;  Location: [facility] OR[facility];  Service: Orthopedics;  Laterality: Left;     Social History   reports that she has never smoked. She has never used smokeless tobacco. She reports that she does not drink alcohol and does not use drugs.   Family History   Her family history includes Diabetes in her father; Hypertension in her father. There is no history of Anesthesia problems, Hypotension, Malignant hyperthermia, Pseudochol deficiency, Colon cancer, or Colon polyps.   Allergies No Known Allergies   Home Medications  Prior to Admission medications   Medication Sig Start Date End Date Taking? Authorizing Provider  acetaminophen (TYLENOL) 325 MG tablet Take 2 tablets (650 mg total) by mouth every 6 (six) hours as needed for mild pain or headache (or Fever >/= 101). 12/23/19  Yes Emokpae, Courage, MD  albuterol (PROVENTIL) (2.5 MG/3ML) 0.083% nebulizer solution Take 3 mLs (2.5 mg total) by nebulization every 2 (two) hours as needed for wheezing or shortness of breath. 05/28/20  Yes Roxan Hockey, MD  amLODipine (NORVASC) 10 MG tablet  07/14/20  Yes [provider]  carvedilol (COREG) 6.25 MG tablet Take 1 tablet (6.25 mg total) by mouth 2 (two) times daily. 05/28/20  Yes Emokpae, Courage, MD  Cholecalciferol (VITAMIN D) 2000 UNITS tablet Take 2,000 Units by mouth daily.    Yes [provider]  furosemide (LASIX) 40 MG tablet Take 3 tablets (120 mg total) by  mouth 2 (two) times daily. 06/09/20 08/03/2020 Yes Dahal, Marlowe Aschoff, MD  glipiZIDE (GLUCOTROL XL) 5 MG 24 hr tablet Take 5 mg by mouth See admin instructions. Take 10 mg in the morning  and 5 mg at night   Yes [provider]  HYDROcodone-acetaminophen (NORCO/VICODIN) 5-325 MG tablet Take 1 tablet by mouth every 6 (six) hours as needed for moderate pain. 06/28/20  Yes Rhyne, Hulen Shouts, PA-C  isosorbide mononitrate (IMDUR) 30 MG 24 hr tablet Take 1 tablet (30 mg total) by mouth daily. 05/28/20 05/28/21 Yes Emokpae, Courage, MD  pantoprazole (PROTONIX) 40 MG tablet Take 1 tablet (40 mg total) by mouth daily. 05/28/20  Yes Emokpae, Courage, MD  potassium chloride (KLOR-CON) 20 MEQ tablet Take 0.5 tablets (10 mEq total) by mouth daily. 06/09/20 07/26/2020 Yes Dahal, Marlowe Aschoff, MD  pravastatin (PRAVACHOL) 10 MG tablet Take 10 mg by mouth at bedtime.    Yes [provider]  vitamin B-12 (CYANOCOBALAMIN) 1000 MCG tablet Take 1,000 mcg by mouth daily.   Yes [provider]  ACCU-CHEK GUIDE test strip  03/30/20   [provider]  Accu-Chek Softclix Lancets lancets SMARTSIG:Topical 03/30/20   [provider]  Alcohol Swabs (B-D SINGLE USE SWABS REGULAR) PADS Apply topically. Patient not taking: Reported on 08/09/2020 03/30/20   [provider]     Critical care time: The patient is critically ill with multiple organ systems failure and requires high complexity decision making for assessment and support, frequent evaluation and titration of therapies, application of advanced monitoring technologies and extensive interpretation of multiple databases.  Critical care time 35 mins. This represents my time independent of the NP's/PA's/med students/residents time taking care of the pt. This is excluding procedures.     [facility] 07/27/2020, 6:26 PM

## 2020-07-27 NOTE — ED Provider Notes (Signed)
[facility] EMERGENCY DEPARTMENT Provider Note   CSN: [id number] Arrival date & time: 08/18/2020  1421  An emergency department physician performed an initial assessment on this suspected stroke patient at 1616.  History Chief Complaint  Patient presents with  . Code Stroke    Cassandra Parker is a 79 y.o. female.  Patient with history of renal insufficiency, heart block, ostectomy and breast cancer 2008, diabetes presents with EMS after family went to check on patient and found her altered and unable to move right side of the body and unable to talk.  Last known normal around 1:00 PM.  Unable to get details from patient due to unfortunate extreme presentation.  No anticoagulant use per report.        Past Medical History:  Diagnosis Date  . Acute renal insufficiency 10/15/2013  . Arthritis   . Cancer [facility]) 2008   right breast  . Cancer of breast- s/p chemo and Rt mastectomy 2008 10/13/2013  . Cardiomyopathy ([place])    dating back to 2010  . CHB (complete heart block) ([place]) 10/13/2013  . Chronic combined systolic and diastolic congestive heart failure ([place])   . Diabetes mellitus    with neuropathy  . Diabetes mellitus type 2, controlled, without complications ([place]) 06/10/9449  . Dyslipidemia 10/13/2013  . History of blood transfusion   . Hypercholesterolemia   . Hypertension   . Mitral stenosis- moderate with trivial MR  10/15/2013  . Pacemaker 10/23/2014  . Pneumonia   . Presence of permanent cardiac pacemaker    [employer]  . Primary osteoarthritis of left knee 11/04/2015  . Syncope 10/13/2013  . Tingling    feet bilat     Patient Active Problem List   Diagnosis Date Noted  . Acute cerebrovascular accident (CVA) due to occlusion of left carotid artery ([place]) 08/07/2020  . Middle cerebral artery embolism, left 08/01/2020  . Acute on chronic diastolic CHF (congestive heart failure) ([place]) 06/09/2020  . Acute metabolic encephalopathy 38/88/2800  . Acute renal failure  superimposed on stage 4 chronic kidney disease ([place]) 06/05/2020  . Chronic respiratory failure with hypoxia ([place]) 06/05/2020  . Pulmonary infiltrate   . Uremia   . Severe Pulmonary HTN-- 05/23/2020  . Respiratory failure with hypoxia ([place]) 05/22/2020  . Rt U L Pneumonia 05/22/2020  . GI bleed 05/22/2020  . Sepsis due to PNA 05/22/2020  . Pulmonary embolism--- VQ scan with intermediate probability 12/22/2019  . Chest pain 12/21/2019  . Primary osteoarthritis of left knee 11/04/2015  . Pacemaker 10/23/2014  . Acute renal insufficiency 10/15/2013  . Mitral stenosis- moderate with trivial MR  10/15/2013  . CHB (complete heart block) ([place]) 10/13/2013  . Syncope 10/13/2013  . HTN (hypertension) 10/13/2013  . Diabetes mellitus type 2, controlled, without complications ([place]) 34/91/7915  . Cancer of breast- s/p chemo and Rt mastectomy 2008 10/13/2013  . Cardiomyopathy- EF 35-40% (etiology not yet determined) 10/13/2013  . Dyslipidemia 10/13/2013    Past Surgical History:  Procedure Laterality Date  . APPENDECTOMY     mmh  . AV FISTULA PLACEMENT Right 05/18/2020   Procedure: RIGHT ARM BRACHIOCEPHALIC ARTERIOVENOUS (AV) FISTULA CREATION;  Surgeon: Elam Dutch, MD;  Location[place];  Service: Vascular;  Laterality: Right;  . BI-VENTRICULAR PACEMAKER INSERTION N/A 10/15/2013   Procedure: BI-VENTRICULAR PACEMAKER INSERTION (CRT-P);  Surgeon: Evans Lance, MD;  Location: [facility] CATH LAB;  Service: Cardiovascular;  Laterality: N/A;  . Cardiomyopathy    . CATARACT EXTRACTION W/PHACO  06/29/2011  Procedure: CATARACT EXTRACTION PHACO AND INTRAOCULAR LENS PLACEMENT (IOC);  Surgeon: Tonny Branch;  Location: [facility];  Service: Ophthalmology;  Laterality: Right;  CDE: 14.41  . CATARACT EXTRACTION W/PHACO  09/04/2011   Procedure: CATARACT EXTRACTION PHACO AND INTRAOCULAR LENS PLACEMENT (IOC);  Surgeon: Tonny Branch;  Location: [facility];  Service: Ophthalmology;  Laterality: Left;  CDE: 15.21  .  CHOLECYSTECTOMY     mmh  . FISTULA SUPERFICIALIZATION Right 06/28/2020   Procedure: SUPERFICIALIZATION RIGHT ARM FISTULA;  Surgeon: Elam Dutch, MD;  Location: [facility] OR;  Service: Vascular;  Laterality: Right;  . JOINT REPLACEMENT Left    knee  . KNEE ARTHROSCOPY     right-mmh  . MASTECTOMY  2008    mmh-right  . RADIOLOGY WITH ANESTHESIA N/A 08/10/2020   Procedure: IR WITH ANESTHESIA;  Surgeon: Luanne Bras, MD;  Location: [place];  Service: Radiology;  Laterality: N/A;  . TEMPORARY PACEMAKER INSERTION N/A 10/13/2013   Procedure: TEMPORARY PACEMAKER INSERTION;  Surgeon: Jolaine Artist, MD;  Location: [facility] CATH LAB;  Service: Cardiovascular;  Laterality: N/A;  . TOTAL KNEE ARTHROPLASTY Left 11/04/2015   Procedure: LEFT TOTAL KNEE ARTHROPLASTY;  Surgeon: Rod Can, MD;  Location: [facility];  Service: Orthopedics;  Laterality: Left;     OB History    Gravida  3   Para  3   Term  3   Preterm      AB      Living        SAB      TAB      Ectopic      Multiple      Live Births              Family History  Problem Relation Age of Onset  . Hypertension Father   . Diabetes Father   . Anesthesia problems Neg Hx   . Hypotension Neg Hx   . Malignant hyperthermia Neg Hx   . Pseudochol deficiency Neg Hx   . Colon cancer Neg Hx   . Colon polyps Neg Hx     Social History   Tobacco Use  . Smoking status: Never Smoker  . Smokeless tobacco: Never Used  Vaping Use  . Vaping Use: Never used  Substance Use Topics  . Alcohol use: No    Alcohol/week: 0.0 standard drinks  . Drug use: No    Home Medications Prior to Admission medications   Medication Sig Start Date End Date Taking? Authorizing Provider  acetaminophen (TYLENOL) 325 MG tablet Take 2 tablets (650 mg total) by mouth every 6 (six) hours as needed for mild pain or headache (or Fever >/= 101). 12/23/19  Yes Emokpae, Courage, MD  albuterol (PROVENTIL) (2.5 MG/3ML) 0.083% nebulizer solution Take 3 mLs  (2.5 mg total) by nebulization every 2 (two) hours as needed for wheezing or shortness of breath. 05/28/20  Yes Roxan Hockey, MD  amLODipine (NORVASC) 10 MG tablet  07/14/20  Yes [provider]  carvedilol (COREG) 6.25 MG tablet Take 1 tablet (6.25 mg total) by mouth 2 (two) times daily. 05/28/20  Yes Emokpae, Courage, MD  Cholecalciferol (VITAMIN D) 2000 UNITS tablet Take 2,000 Units by mouth daily.    Yes [provider]  furosemide (LASIX) 40 MG tablet Take 3 tablets (120 mg total) by mouth 2 (two) times daily. 06/09/20 07/30/2020 Yes Dahal, Marlowe Aschoff, MD  glipiZIDE (GLUCOTROL XL) 5 MG 24 hr tablet Take 5 mg by mouth See admin instructions. Take 10 mg in the morning  and 5 mg at night   Yes [provider]  HYDROcodone-acetaminophen (NORCO/VICODIN) 5-325 MG tablet Take 1 tablet by mouth every 6 (six) hours as needed for moderate pain. 06/28/20  Yes Rhyne, Hulen Shouts, PA-C  isosorbide mononitrate (IMDUR) 30 MG 24 hr tablet Take 1 tablet (30 mg total) by mouth daily. 05/28/20 05/28/21 Yes Emokpae, Courage, MD  pantoprazole (PROTONIX) 40 MG tablet Take 1 tablet (40 mg total) by mouth daily. 05/28/20  Yes Emokpae, Courage, MD  potassium chloride (KLOR-CON) 20 MEQ tablet Take 0.5 tablets (10 mEq total) by mouth daily. 06/09/20 07/29/2020 Yes Dahal, Marlowe Aschoff, MD  pravastatin (PRAVACHOL) 10 MG tablet Take 10 mg by mouth at bedtime.    Yes [provider]  vitamin B-12 (CYANOCOBALAMIN) 1000 MCG tablet Take 1,000 mcg by mouth daily.   Yes [provider]  ACCU-CHEK GUIDE test strip  03/30/20   [provider]  Accu-Chek Softclix Lancets lancets SMARTSIG:Topical 03/30/20   [provider]  Alcohol Swabs (B-D SINGLE USE SWABS REGULAR) PADS Apply topically. Patient not taking: Reported on 08/15/2020 03/30/20   [provider]    Allergies    Patient has no known allergies.  Review of Systems   Review of Systems  Unable to perform ROS: Mental status change     Physical Exam Updated Vital Signs BP (!) 121/55   Pulse 60   Temp (!) 97.3 F (36.3 C)   Resp 18   Wt 99.7 kg   SpO2 97%   BMI 38.95 kg/m   Physical Exam Vitals and nursing note reviewed.  Constitutional:      Appearance: She is ill-appearing. She is not diaphoretic.  HENT:     Head: Normocephalic.     Nose: Nose normal.  Eyes:     General:        Right eye: No discharge.        Left eye: No discharge.  Cardiovascular:     Rate and Rhythm: Normal rate.  Pulmonary:     Effort: Pulmonary effort is normal.  Abdominal:     General: Abdomen is flat. There is no distension.  Musculoskeletal:        General: No swelling.     Cervical back: Neck supple.  Skin:    General: Skin is warm.     Capillary Refill: Capillary refill takes less than 2 seconds.  Neurological:     Comments: Patient intermittently will follow some commands however no verbal response, left eye gaze, pupils 3 mm bilateral, flaccid right arm, patient will move left side of the body intermittently with grossly normal strength.  Right facial droop.  Psychiatric:     Comments: Confused     ED Results / Procedures / Treatments   Labs (all labs ordered are listed, but only abnormal results are displayed) Labs Reviewed  CBC - Abnormal; Notable for the following components:      Result Value   Hemoglobin 11.5 (*)    RDW 16.0 (*)    All other components within normal limits  DIFFERENTIAL - Abnormal; Notable for the following components:   Neutro Abs 8.2 (*)    Abs Immature Granulocytes 0.12 (*)    All other components within normal limits  COMPREHENSIVE METABOLIC PANEL - Abnormal; Notable for the following components:   Potassium 5.4 (*)    CO2 17 (*)    Glucose, Bld 336 (*)    BUN 94 (*)    Creatinine, Ser 4.32 (*)    Calcium  8.6 (*)    Albumin 3.4 (*)    GFR, Estimated 10 (*)    Anion gap 16 (*)    All other components within normal limits  HEMOGLOBIN A1C - Abnormal; Notable for the  following components:   Hgb A1c MFr Bld 8.5 (*)    All other components within normal limits  LIPID PANEL - Abnormal; Notable for the following components:   Triglycerides 222 (*)    VLDL 44 (*)    All other components within normal limits  BASIC METABOLIC PANEL - Abnormal; Notable for the following components:   CO2 18 (*)    Glucose, Bld 307 (*)    BUN 87 (*)    Creatinine, Ser 4.18 (*)    Calcium 8.1 (*)    GFR, Estimated 10 (*)    All other components within normal limits  TRIGLYCERIDES - Abnormal; Notable for the following components:   Triglycerides 218 (*)    All other components within normal limits  BASIC METABOLIC PANEL - Abnormal; Notable for the following components:   CO2 18 (*)    Glucose, Bld 246 (*)    BUN 85 (*)    Creatinine, Ser 4.47 (*)    Calcium 8.4 (*)    GFR, Estimated 10 (*)    All other components within normal limits  CBC WITH DIFFERENTIAL/PLATELET - Abnormal; Notable for the following components:   RBC 3.21 (*)    Hemoglobin 9.5 (*)    HCT 30.4 (*)    RDW 15.8 (*)    All other components within normal limits  GLUCOSE, CAPILLARY - Abnormal; Notable for the following components:   Glucose-Capillary 238 (*)    All other components within normal limits  GLUCOSE, CAPILLARY - Abnormal; Notable for the following components:   Glucose-Capillary 263 (*)    All other components within normal limits  PROTIME-INR - Abnormal; Notable for the following components:   Prothrombin Time 22.1 (*)    INR 2.0 (*)    All other components within normal limits  APTT - Abnormal; Notable for the following components:   aPTT 39 (*)    All other components within normal limits  FIBRINOGEN - Abnormal; Notable for the following components:   Fibrinogen <60 (*)    All other components within normal limits  PROTIME-INR - Abnormal; Notable for the following components:   Prothrombin Time 18.6 (*)    INR 1.6 (*)    All other components within normal limits  CBC -  Abnormal; Notable for the following components:   RBC 2.85 (*)    Hemoglobin 8.4 (*)    HCT 27.1 (*)    RDW 15.8 (*)    All other components within normal limits  BASIC METABOLIC PANEL - Abnormal; Notable for the following components:   CO2 20 (*)    Glucose, Bld 234 (*)    BUN 89 (*)    Creatinine, Ser 4.31 (*)    Calcium 8.3 (*)    GFR, Estimated 10 (*)    All other components within normal limits  FIBRINOGEN - Abnormal; Notable for the following components:   Fibrinogen <60 (*)    All other components within normal limits  D-DIMER, QUANTITATIVE (NOT AT [facility]) - Abnormal; Notable for the following components:   D-Dimer, Quant >20.00 (*)    All other components within normal limits  GLUCOSE, CAPILLARY - Abnormal; Notable for the following components:   Glucose-Capillary 201 (*)    All other components within normal limits  CBG MONITORING, ED - Abnormal; Notable for the following components:   Glucose-Capillary 366 (*)    All other components within normal limits  RESPIRATORY PANEL BY RT PCR (FLU A&B, COVID)  MRSA PCR SCREENING  APTT  SODIUM  SODIUM  SODIUM  CBC WITH DIFFERENTIAL/PLATELET  PATHOLOGIST SMEAR REVIEW  SODIUM  PREPARE FRESH FROZEN PLASMA  ABO/RH  PREPARE CRYOPRECIPITATE  TYPE AND SCREEN    EKG None  Radiology CT ABDOMEN PELVIS WO CONTRAST  Result Date: 08-23-20 CLINICAL DATA:  79 year old female with history of anemia. Evaluate for potential retroperitoneal bleed. Patient administered tPA yesterday evening. EXAM: CT ABDOMEN AND PELVIS WITHOUT CONTRAST TECHNIQUE: Multidetector CT imaging of the abdomen and pelvis was performed following the standard protocol without IV contrast. COMPARISON:  No priors. FINDINGS: Lower chest: Dependent areas of subsegmental atelectasis in the left lower lobe. Cardiomegaly. Atherosclerotic calcifications in the descending thoracic aorta as well as the left anterior descending, left circumflex and right coronary arteries.  Pacemaker leads in the right atrium and right ventricle. Hepatobiliary: No discrete cystic or solid hepatic lesions confidently identified on today's noncontrast CT examination. Status post cholecystectomy. Pancreas: No definite pancreatic mass or peripancreatic fluid collections or inflammatory changes are identified on today's noncontrast CT examination. Spleen: Unremarkable. Adrenals/Urinary Tract: Contrast material in the collecting systems of both kidneys, ureters and in the urinary bladder, presumably from recent interventional procedure. In the medial aspect of the upper pole of the left kidney there is a 1.7 x 1.3 cm high attenuation lesion (95 HU), incompletely characterized. Right kidney and bilateral adrenal glands are otherwise unremarkable in appearance. No hydroureteronephrosis. Urinary bladder is unremarkable in appearance. Stomach/Bowel: Nasogastric tube extending into the distal body of the stomach. Stomach is otherwise unremarkable in appearance. No pathologic dilatation of small bowel or colon. Numerous colonic diverticulae are noted, particularly in the sigmoid colon, without surrounding inflammatory changes to suggest an acute diverticulitis at this time. The appendix is not confidently identified and may be surgically absent. Regardless, there are no inflammatory changes noted adjacent to the cecum to suggest the presence of an acute appendicitis at this time. Vascular/Lymphatic: Aortic atherosclerosis. No lymphadenopathy noted in the abdomen or pelvis. Reproductive: Uterus and ovaries are unremarkable in appearance. Other: No high attenuation fluid collection in the peritoneal cavity or retroperitoneum to suggest significant hemorrhage. No significant volume of ascites. No pneumoperitoneum. Musculoskeletal: There are no aggressive appearing lytic or blastic lesions noted in the visualized portions of the skeleton. IMPRESSION: 1. No occult hemorrhage identified in the abdomen or pelvis to  account for the patient's decreasing hematocrit. 2. No acute findings noted in the abdomen or pelvis. 3. 1.7 x 1.3 cm high attenuation lesion in the medial aspect of the upper pole of the left kidney, incompletely characterized on today's examination. Further evaluation with nonemergent abdominal MRI with and without IV gadolinium is recommended after resolution of the patient's acute illness to exclude the possibility of neoplasm. 4. Colonic diverticulosis without evidence of acute diverticulitis at this time. 5. Aortic atherosclerosis, in addition to at least 3 vessel coronary artery disease. Assessment for potential risk factor modification, dietary therapy or pharmacologic therapy may be warranted, if clinically indicated. 6. Additional incidental findings, as above. Electronically Signed   By: Vinnie Langton M.D.   On: 08/23/20 09:43   CT Angio Head W or Wo Contrast  Result Date: 08/04/2020 CLINICAL DATA:  79 year old female code stroke presentation with loss of speech, right side weakness. Hyperdense left ICA terminus and left M1 on  plain head CT. EXAM: CT ANGIOGRAPHY HEAD AND NECK TECHNIQUE: Multidetector CT imaging of the head and neck was performed using the standard protocol during bolus administration of intravenous contrast. Multiplanar CT image reconstructions and MIPs were obtained to evaluate the vascular anatomy. Carotid stenosis measurements (when applicable) are obtained utilizing NASCET criteria, using the distal internal carotid diameter as the denominator. CONTRAST:  57m OMNIPAQUE IOHEXOL 350 MG/ML SOLN COMPARISON:  Plain head CT at 1426 hours today. FINDINGS: CTA NECK Skeleton: Mostly absent dentition. Advanced lower cervical spine degeneration. No acute osseous abnormality identified. Upper chest: Left chest cardiac pacemaker type device. Otherwise negative. Other neck: Negative; partially retropharyngeal course of both carotids. Aortic arch: 3 vessel arch configuration. Minimal arch  atherosclerosis. Right carotid system: Minimal brachiocephalic artery plaque without stenosis. Mild motion artifact at the thoracic inlet. Mildly tortuous proximal right CCA. No definite stenosis. Retropharyngeal course of the right CCA at the level of the larynx and pharynx. Minimal calcified plaque at the bifurcation affecting the ECA. No right ICA plaque or stenosis. Tortuous right ICA at the C2-C3 level. Left carotid system: Negative left CCA origin. Tortuous proximal left CCA and retropharyngeal course similar to that on the right. At the left carotid bifurcation there is mild soft and calcified plaque. The left ICA origin and bulb are patent, but the vessel appears gradually occluded distal to the bulb. There is no enhancement at the skull base. See intracranial findings below. Vertebral arteries: Negative proximal right subclavian artery. Mild calcified plaque at the right vertebral artery origin without stenosis. Patent right vertebral artery to the skull base without additional plaque or stenosis. Mild plaque in the proximal left subclavian artery without stenosis. Similar minimal calcified plaque at the left vertebral artery origin without stenosis. Dominant left vertebral artery with a tortuous V1 segment. Patent left vertebral to the skull base without stenosis. CTA HEAD Posterior circulation: Patent distal vertebral arteries, the left is mildly dominant. Patent PICA origins. No distal vertebral plaque or stenosis. Patent vertebrobasilar junction and basilar artery without stenosis. Patent SCA and PCA origins. Posterior communicating arteries are diminutive or absent. Right PCA branches are within normal limits. The left PCA is mildly irregular but otherwise within normal limits. Anterior circulation: Occluded left ICA siphon with superimposed mild to moderate calcified plaque. Left ICA terminus is occluded. Left MCA and ACA origins are occluded. There is no significant reconstitution of the left MCA.  The left ACA is reconstituted in the A1 segment. Anterior communicating artery appears normal. Bilateral ACA branches are within normal limits. Preliminary report of the above discussed by telephone with Dr. JElnora Morrisonon 08/20/2020 at 15:05 . Patent right ICA siphon with mild to moderate calcified plaque. Only mild supraclinoid stenosis. Patent right ICA terminus. Patent right MCA and ACA origins. The right A1 is within normal limits. Right MCA M1 segment and trifurcation are patent without stenosis. Right MCA branches are within normal limits. Venous sinuses: Patent. Anatomic variants: Dominant left vertebral artery. Review of the MIP images confirms the above findings IMPRESSION: 1. Positive for Emergent Large Vessel Occlusion: Left ICA T-occlusion. LICA occluded in the neck and at the terminus along with the Left MCA and ACA origins. The left ACA is reconstituted but the MCA is not. This was discussed by telephone with Dr. JElnora Morrisonon 08/09/2020 at 15:05 . 2. Underlying mild for age atherosclerosis. Calcified plaque most notable in both ICA siphons. 3. Left chest pacemaker. Electronically Signed   By: HGenevie AnnM.D.   On: 08/04/2020  15:13   CT HEAD WO CONTRAST  Result Date: 07-31-20 CLINICAL DATA:  79 year old female status post E LV 0, left ICA T occlusion yesterday. Transferred from [facility] status post NIR. EXAM: CT HEAD WITHOUT CONTRAST TECHNIQUE: Contiguous axial images were obtained from the base of the skull through the vertex without intravenous contrast. COMPARISON:  Code stroke presentation head CT 1426 hours yesterday. FINDINGS: Brain: Severe new intracranial mass effect. Leftward midline shift of 18 mm with sub fall seen herniation and partially effaced basilar cisterns. Large left MCA territory infarct although with abundant hyperdense parenchyma. Some of this is related to malignant hemorrhagic transformation. A component of parenchymal contrast staining is also possible.  Superimposed left ACA territory infarct has developed probably from the subfalcine herniation (series 3, image 19). Effaced left and trapped right lateral ventricles. No intraventricular hemorrhage identified. However, there is evidence of bilateral subarachnoid hemorrhage. Less likely the appearance might be a pseudo SAH (related to diffuse venous congestion. No right hemisphere or cerebellar infarct is evident, although there is suspicion of edema in the bilateral thalami and brainstem. Vascular: Calcified atherosclerosis at the skull base. There seems to be residual intravascular contrast present in the major arterial and venous structures. Skull: No acute osseous abnormality identified. Sinuses/Orbits: Visualized paranasal sinuses and mastoids are stable and well pneumatized. Other: No acute orbit or scalp soft tissue findings. IMPRESSION: 1. Severe intracranial mass effect and severe infarction of the left hemisphere. 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 New left ACA infarct which may be subfalcine herniation related. 2. Rightward midline shift of 18 mm with trapping of the right lateral ventricle and partially effaced basilar cisterns. 3. Suspicion of superimposed bilateral subarachnoid hemorrhage, although residual intravascular contrast is present and pseudo-SAH from venous congestion is also possible. Critical Value/emergent results were called by telephone at the time of interpretation on 07-31-2020 at 0824 hours to Dr. Erlinda Hong who verbally acknowledged these results. Electronically Signed   By: Genevie Ann M.D.   On: 07/31/20 08:51   CT Angio Neck W and/or Wo Contrast  Result Date: 08/24/2020 CLINICAL DATA:  79 year old female code stroke presentation with loss of speech, right side weakness. Hyperdense left ICA terminus and left M1 on plain head CT. EXAM: CT ANGIOGRAPHY HEAD AND NECK TECHNIQUE: Multidetector CT imaging of the head and neck was performed using the  standard protocol during bolus administration of intravenous contrast. Multiplanar CT image reconstructions and MIPs were obtained to evaluate the vascular anatomy. Carotid stenosis measurements (when applicable) are obtained utilizing NASCET criteria, using the distal internal carotid diameter as the denominator. CONTRAST:  44m OMNIPAQUE IOHEXOL 350 MG/ML SOLN COMPARISON:  Plain head CT at 1426 hours today. FINDINGS: CTA NECK Skeleton: Mostly absent dentition. Advanced lower cervical spine degeneration. No acute osseous abnormality identified. Upper chest: Left chest cardiac pacemaker type device. Otherwise negative. Other neck: Negative; partially retropharyngeal course of both carotids. Aortic arch: 3 vessel arch configuration. Minimal arch atherosclerosis. Right carotid system: Minimal brachiocephalic artery plaque without stenosis. Mild motion artifact at the thoracic inlet. Mildly tortuous proximal right CCA. No definite stenosis. Retropharyngeal course of the right CCA at the level of the larynx and pharynx. Minimal calcified plaque at the bifurcation affecting the ECA. No right ICA plaque or stenosis. Tortuous right ICA at the C2-C3 level. Left carotid system: Negative left CCA origin. Tortuous proximal left CCA and retropharyngeal course similar to that on the right. At the left carotid bifurcation there is mild soft and calcified plaque.  The left ICA origin and bulb are patent, but the vessel appears gradually occluded distal to the bulb. There is no enhancement at the skull base. See intracranial findings below. Vertebral arteries: Negative proximal right subclavian artery. Mild calcified plaque at the right vertebral artery origin without stenosis. Patent right vertebral artery to the skull base without additional plaque or stenosis. Mild plaque in the proximal left subclavian artery without stenosis. Similar minimal calcified plaque at the left vertebral artery origin without stenosis. Dominant left  vertebral artery with a tortuous V1 segment. Patent left vertebral to the skull base without stenosis. CTA HEAD Posterior circulation: Patent distal vertebral arteries, the left is mildly dominant. Patent PICA origins. No distal vertebral plaque or stenosis. Patent vertebrobasilar junction and basilar artery without stenosis. Patent SCA and PCA origins. Posterior communicating arteries are diminutive or absent. Right PCA branches are within normal limits. The left PCA is mildly irregular but otherwise within normal limits. Anterior circulation: Occluded left ICA siphon with superimposed mild to moderate calcified plaque. Left ICA terminus is occluded. Left MCA and ACA origins are occluded. There is no significant reconstitution of the left MCA. The left ACA is reconstituted in the A1 segment. Anterior communicating artery appears normal. Bilateral ACA branches are within normal limits. Preliminary report of the above discussed by telephone with Dr. Elnora Morrison on 08/15/2020 at 15:05 . Patent right ICA siphon with mild to moderate calcified plaque. Only mild supraclinoid stenosis. Patent right ICA terminus. Patent right MCA and ACA origins. The right A1 is within normal limits. Right MCA M1 segment and trifurcation are patent without stenosis. Right MCA branches are within normal limits. Venous sinuses: Patent. Anatomic variants: Dominant left vertebral artery. Review of the MIP images confirms the above findings IMPRESSION: 1. Positive for Emergent Large Vessel Occlusion: Left ICA T-occlusion. LICA occluded in the neck and at the terminus along with the Left MCA and ACA origins. The left ACA is reconstituted but the MCA is not. This was discussed by telephone with Dr. Elnora Morrison on 08/24/2020 at 15:05 . 2. Underlying mild for age atherosclerosis. Calcified plaque most notable in both ICA siphons. 3. Left chest pacemaker. Electronically Signed   By: Genevie Ann M.D.   On: 08/12/2020 15:13   DG CHEST PORT 1  VIEW  Result Date: 08/18/2020 CLINICAL DATA:  Respiratory failure, hypoxic EXAM: PORTABLE CHEST 1 VIEW COMPARISON:  Is 06/05/2020 FINDINGS: None endotracheal tube is been placed with its tip 18 mm above the carina. Lung volumes are small, but are symmetric and are clear. Previously noted perihilar pulmonary infiltrate has resolved. No pneumothorax or pleural effusion. Cardiac size is mildly enlarged, unchanged. Left subclavian pacemaker is unchanged. Pulmonary vascularity is normal. No acute bone abnormality. IMPRESSION: Endotracheal tube 18 mm above the carina. Pulmonary hypoinflation. Resolved pulmonary infiltrate. Electronically Signed   By: Fidela Salisbury MD   On: 08/24/2020 19:19   CT HEAD CODE STROKE WO CONTRAST  Result Date: 08/01/2020 CLINICAL DATA:  Code stroke. Neuro deficit, acute stroke suspected. Altered mental status. Unable to speak or move right side. EXAM: CT HEAD WITHOUT CONTRAST TECHNIQUE: Contiguous axial images were obtained from the base of the skull through the vertex without intravenous contrast. COMPARISON:  CT head 06/05/2020. FINDINGS: Brain: Evaluation is limited by motion; however, possible mild hypodensity of the left insula. No acute hemorrhage. Similar appearance of a remote high left frontal cortical infarct. Additional patchy white matter hypoattenuation, compatible with chronic microvascular ischemic disease. Vascular: Hyperdense left ICA terminus and proximal  M1 MCA, concerning for thrombus. Calcific atherosclerosis. Skull: No acute fracture. Sinuses/Orbits: No acute findings. Other: No mastoid effusion. ASPECTS [facility] Stroke Program Early CT Score) - Ganglionic level infarction (caudate, lentiform nuclei, internal capsule, insula, M1-M3 cortex): 6 - Supraganglionic infarction (M4-M6 cortex): 3 Total score (0-10 with 10 being normal): 9 IMPRESSION: 1. Hyperdense left ICA terminus and proximal left M1 MCA, concerning for thrombus. See forthcoming ordered CTA for further  evaluation. 2. Evaluation is limited by patient motion; however, possible mild hypodensity of the left insula, as can be seen with acute MCA territory infarct. ASPECTS is 9. 3. No acute hemorrhage. Code stroke imaging results were communicated on 07/30/2020 at 2:41 pm to provider Dr. Reather Converse Via telephone, who verbally acknowledged these results. Electronically Signed   By: Margaretha Sheffield MD   On: 08/07/2020 14:48    Procedures .Critical Care Performed by: Elnora Morrison, MD Authorized by: Elnora Morrison, MD   Critical care provider statement:    Critical care time (minutes):  35   Critical care start time:  07/30/2020 2:35 PM   Critical care was time spent personally by me on the following activities:  Discussions with consultants, evaluation of patient's response to treatment, examination of patient, ordering and performing treatments and interventions, ordering and review of laboratory studies, ordering and review of radiographic studies, pulse oximetry, re-evaluation of patient's condition and review of old charts   (including critical care time)  Medications Ordered in ED Medications  alteplase (ACTIVASE) 1 mg/mL injection (has no administration in time range)  ceFAZolin (ANCEF) 2-4 GM/100ML-% IVPB (has no administration in time range)  albuterol (PROVENTIL) (2.5 MG/3ML) 0.083% nebulizer solution 2.5 mg (has no administration in time range)   stroke: mapping our early stages of recovery book (has no administration in time range)  fentaNYL (SUBLIMAZE) injection 25 mcg (has no administration in time range)  fentaNYL (SUBLIMAZE) injection 25-100 mcg (has no administration in time range)  propofol (DIPRIVAN) 1000 MG/100ML infusion (0 mcg/kg/min  99.7 kg Intravenous Stopped 23-Aug-2020 0605)  acetaminophen (TYLENOL) tablet 650 mg (has no administration in time range)    Or  acetaminophen (TYLENOL) 160 MG/5ML solution 650 mg (has no administration in time range)    Or  acetaminophen (TYLENOL)  suppository 650 mg (has no administration in time range)  clevidipine (CLEVIPREX) infusion 0.5 mg/mL (2 mg/hr Intravenous Rate/Dose Verify 2020-08-23 0700)  chlorhexidine gluconate (MEDLINE KIT) (PERIDEX) 0.12 % solution 15 mL (15 mLs Mouth Rinse Given 2020-08-23 0854)  MEDLINE mouth rinse (15 mLs Mouth Rinse Given 08/23/20 0928)  Chlorhexidine Gluconate Cloth 2 % PADS 6 each (6 each Topical Given 08/08/2020 1830)  insulin aspart (novoLOG) injection 0-6 Units (0 Units Subcutaneous Not Given Aug 23, 2020 0927)  cholecalciferol (VITAMIN D3) tablet 2,000 Units (2,000 Units Per Tube Not Given Aug 23, 2020 0926)  docusate (COLACE) 50 MG/5ML liquid 100 mg (100 mg Per Tube Not Given Aug 23, 2020 0927)  polyethylene glycol (MIRALAX / GLYCOLAX) packet 17 g (17 g Per Tube Not Given 08/23/2020 0928)  vitamin B-12 (CYANOCOBALAMIN) tablet 1,000 mcg (1,000 mcg Per Tube Not Given 08/23/20 0929)  senna-docusate (Senokot-S) tablet 1 tablet (has no administration in time range)  pantoprazole sodium (PROTONIX) 40 mg/20 mL oral suspension 40 mg (has no administration in time range)  sodium chloride (hypertonic) 3 % solution ( Intravenous New Bag/Given 23-Aug-2020 0746)  sodium chloride 23.4 % (4 mEq/mL) IV in VIAFLEX BAG (120 mEq Intravenous Not Given 2020/08/23 0928)  hydrALAZINE (APRESOLINE) tablet 50 mg (has no administration in time range)  iohexol (OMNIPAQUE) 350 MG/ML injection 100 mL (75 mLs Intravenous Contrast Given 08/18/2020 1447)  labetalol (NORMODYNE) 5 MG/ML injection (20 mg  Given 08/04/2020 1908)  alteplase (ACTIVASE) 1 mg/mL infusion 89.7 mg (89.7 mg Intravenous New Bag/Given 08/05/2020 1500)  0.9 %  sodium chloride infusion (Manually program via Guardrails IV Fluids) ( Intravenous New Bag/Given 07-31-20 0921)  Thrombi-Pad 3"X3" pad 1 each (1 each Topical Given 07/31/2020 0654)  0.9 %  sodium chloride infusion (Manually program via Guardrails IV Fluids) ( Intravenous New Bag/Given 07/31/20 0906)  tranexamic acid (CYKLOKAPRON) IVPB 1,000 mg (1,000  mg Intravenous New Bag/Given Jul 31, 2020 0539)    ED Course  I have reviewed the triage vital signs and the nursing notes.  Pertinent labs & imaging results that were available during my care of the patient were reviewed by me and considered in my medical decision making (see chart for details).    MDM Rules/Calculators/A&P                          Patient presents as code stroke with onset time 1:00.  Strong clinical concern for large vessel stroke with right-sided weakness, facial droop, neglect and acute presentation. On arrival assessed the patient in the hallway, patient may need intubation however currently protecting airway.  Patient taken immediately to CT scan which was reviewed with radiology and neurology showing concern for dense MCA sign on the left. Blood work ordered, discussed with neurology and patient will either receive thrombectomy/intra arterial versus IV TPA.  No family at bedside at this time.  Repeat discussion with neurology, recommend transfer to [facility] for IR thrombectomy due to large vessel occlusion and recent onset.  Patient protecting airway on recheck, plan for anesthesia to intubate on arrival for procedure. Blood work in process, Hb 11.5, glucose 336, Cr 4.3.      Final Clinical Impression(s) / ED Diagnoses Final diagnoses:  Right arm weakness  Acute ischemic stroke ([place])  Hyperglycemia    Rx / DC Orders ED Discharge Orders    None       Elnora Morrison, MD 2020/07/31 1317

## 2020-07-27 NOTE — Consult Note (Addendum)
[facility] Telemedicine Consult   Requesting Provider: Rosalyn Gess, EDP Consult Participants: Rory Percy, MD, Telepsecialist RN Otila Kluver, Bedside RNs at [facility] Cherlynn Perches of the provider: [facility] Neurology office Location of the patient: [facility]  This consult was provided via telemedicine with 2-way video and audio communication. The patient/family was informed that care would be provided in this way and agreed to receive care in this manner.    Chief Complaint: Aphasia and right-sided weakness  HPI: 79 year old with past medical history of breast cancer, cardiomyopathy, complete heart block status post pacemaker, chronic combined systolic and diastolic congestive heart failure which the family does not know that she has, diabetes with neuropathy, hypertension, hypercholesterolemia, mitral stenosis with trivial MR, CKD, presented to the emergency room with last known normal confirmed with the daughter Debara Kamphuis PM today 08/09/2020 with sudden onset of right-sided weakness and inability to talk.  The family reports that at baseline she is able to take care of herself independently, walk most of the time without any support-has been using a walker only for some support for the last month after her discharge from the hospital for a pneumonia that she is being treated for, has not had a stroke in the past and has no neurological complaints other than some neuropathy. Patient is unable to provide any history. A code stroke was activated at the emergency room at [facility] after her initial evaluation by the EDP I was able to evaluate the patient via camera with the assistance of the telemetry specialist RN as well as to bedside RNs at the patient's bedside. I was able to speak with the daughter over the phone on multiple occasions. She confirmed that the patient was on anticoagulation at some point in the past but it was discontinued over a month ago.  Past Medical History:   Diagnosis Date  . Acute renal insufficiency 10/15/2013  . Arthritis   . Cancer [facility]) 2008   right breast  . Cancer of breast- s/p chemo and Rt mastectomy 2008 10/13/2013  . Cardiomyopathy ([place])    dating back to 2010  . CHB (complete heart block) ([place]) 10/13/2013  . Chronic combined systolic and diastolic congestive heart failure ([place])   . Diabetes mellitus    with neuropathy  . Diabetes mellitus type 2, controlled, without complications ([place]) 02/09/16  . Dyslipidemia 10/13/2013  . History of blood transfusion   . Hypercholesterolemia   . Hypertension   . Mitral stenosis- moderate with trivial MR  10/15/2013  . Pacemaker 10/23/2014  . Pneumonia   . Presence of permanent cardiac pacemaker    [employer]  . Primary osteoarthritis of left knee 11/04/2015  . Syncope 10/13/2013  . Tingling    feet bilat      LKW: 12 PM on 07/27/2020 tpa given?:  Yes IR Thrombectomy?  Yes Modified Rankin Scale: 1-No significant post stroke disability and can perform usual duties with stroke symptoms Time of teleneurologist evaluation: 4944 hrs.  Exam: There were no vitals filed for this visit. Blood pressure was 140/89 General: Awake, alert, left gaze preference HEENT: No obvious injuries noted over the camera, pupils were noted to be symmetric CVS: Regular rate rhythm on the monitor Respiratory: Required supplemental oxygen Neurological exam Awake, alert, not responding to commands. Completely mute Left gaze preference with some movement towards midline but unable to look to the right. No blink to threat from the right, right lower facial weakness noted, right upper extremity flaccid, right lower extremity with the triple  flexion to noxious stimulation.  Left upper extremity was antigravity without weakness.  Left lower extremity drifted to bed but mostly because of inability to follow commands.  NIH stroke scale 1A: Level of Consciousness - 0 1B: Ask Month and Age - 2 1C: 'Blink Eyes' & 'Squeeze  Hands' - 2 2: Test Horizontal Extraocular Movements - 1 3: Test Visual Fields - 2 4: Test Facial Palsy - 2 5A: Test Left Arm Motor Drift - 0 5B: Test Right Arm Motor Drift - 4 6A: Test Left Leg Motor Drift - 1 6B: Test Right Leg Motor Drift - 3 7: Test Limb Ataxia - 0 8: Test Sensation - 2 9: Test Language/Aphasia- 3 10: Test Dysarthria - 2 11: Test Extinction/Inattention - 0 NIHSS score: 24   Imaging Reviewed:  CT head with hyperdense left MCA sign. Aspects 9-10. No bleed CTA head and neck with a left cervical carotid occlusion and left carotid terminus occlusion along with left MCA and ACA origin clot.  The left ACA is reconstituted but the MCA is not.  Labs reviewed in epic and pertinent values follow: CBC    Component Value Date/Time   WBC 7.2 06/09/2020 0600   RBC 2.91 (L) 06/09/2020 0600   HGB 11.2 (L) 06/28/2020 0823   HCT 33.0 (L) 06/28/2020 0823   PLT 375 06/09/2020 0600   MCV 95.2 06/09/2020 0600   MCH 28.5 06/09/2020 0600   MCHC 30.0 06/09/2020 0600   RDW 15.4 06/09/2020 0600   LYMPHSABS 2.2 06/09/2020 0600   MONOABS 0.9 06/09/2020 0600   EOSABS 0.2 06/09/2020 0600   BASOSABS 0.1 06/09/2020 0600   BMP Latest Ref Rng & Units 06/28/2020 06/09/2020 06/08/2020  Glucose 70 - 99 mg/dL 296(H) 164(H) 165(H)  BUN 8 - 23 mg/dL 103(H) 80(H) 84(H)  Creatinine 0.44 - 1.00 mg/dL 4.10(H) 3.36(H) 3.72(H)  Sodium 135 - 145 mmol/L 138 141 142  Potassium 3.5 - 5.1 mmol/L 3.9 3.4(L) 3.6  Chloride 98 - 111 mmol/L 107 101 104  CO2 22 - 32 mmol/L - 30 27  Calcium 8.9 - 10.3 mg/dL - 8.9 8.8(L)   Fingerstick glucose was 366.   Assessment: 79 year old woman with above past medical history with sudden onset of right hemiparesis, left gaze preference, right homonymous hemianopsia and right facial weakness along with global aphasia noted to have a dense left MCA sign on CT head.  IV TPA given after discussing with the daughter who agreed. Head and neck vessel imaging suggestive of  left cervical carotid occlusion, left carotid terminus occlusion along with left MCA and ACA origin occlusion with reconstitution of the left ACA but not the left MCA. At baseline, she is independent, modified Rankin of 0-1. Endovascular thrombectomy option discussed with the daughter over the phone on the second call.  She agrees and call was made to [facility] with interventionalist Dr. Estanislado Pandy and: Neuro hospitalist Dr. Curly Shores.  Patient accepted by neurology to neuro interventional suite for emergent thrombectomy followed by admission to the neurology stroke service in the neuro ICU at [facility]. Daughter made aware of the plan. Plan also discussed with Dr. Reather Converse, Dr. Estanislado Pandy and Dr. Curly Shores  Recommendations:  Post TPA vitals and neuro checks. Post TPA-no antiplatelets or anticoagulants for at least 24 hours. Systolic blood pressure goal strictly systolic blood pressure less than 180.  Use labetalol as needed and if longer need be, use Cleviprex drip. Blood sugar management per ICU post TPA protocol. Emergent transfer to [facility]  for endovascular thrombectomy-interventionalist and neurologist at [facility] made aware via three-way call. Decision for repeat imaging deferred to the stroke team.  -- Amie Portland, MD [facility] Pager: [phone]   Delays in the process other than the limitations of a tele assessment: Difficult IV access, difficult and delayed lab draw.  This patient is receiving care for possible acute neurological changes. There was 50 minutes of care by this provider at the time of service, including time for direct evaluation via telemedicine, review of medical records, imaging studies and discussion of findings with providers, the patient and/or family.   CRITICAL CARE ATTESTATION Performed by: Amie Portland, MD Total critical care time: 50 minutes Critical care time was exclusive of separately billable  procedures and treating other patients and/or supervising APPs/Residents/Students Critical care was necessary to treat or prevent imminent or life-threatening deterioration due to acute ischemic stroke, intravenous thrombolysis, decision for endovascular thrombectomy This patient is critically ill and at significant risk for neurological worsening and/or death and care requires constant monitoring. Critical care was time spent personally by me on the following activities: development of treatment plan with patient and/or surrogate as well as nursing, discussions with consultants, evaluation of patient's response to treatment, examination of patient, obtaining history from patient or surrogate, ordering and performing treatments and interventions, ordering and review of laboratory studies, ordering and review of radiographic studies, pulse oximetry, re-evaluation of patient's condition, participation in multidisciplinary rounds and medical decision making of high complexity in the care of this patient.

## 2020-07-28 ENCOUNTER — Inpatient Hospital Stay (HOSPITAL_COMMUNITY): Payer: Medicare HMO

## 2020-07-28 ENCOUNTER — Encounter (HOSPITAL_COMMUNITY): Payer: Self-pay | Admitting: Interventional Radiology

## 2020-07-28 DIAGNOSIS — D689 Coagulation defect, unspecified: Secondary | ICD-10-CM

## 2020-07-28 DIAGNOSIS — I361 Nonrheumatic tricuspid (valve) insufficiency: Secondary | ICD-10-CM | POA: Diagnosis not present

## 2020-07-28 DIAGNOSIS — I340 Nonrheumatic mitral (valve) insufficiency: Secondary | ICD-10-CM | POA: Diagnosis not present

## 2020-07-28 DIAGNOSIS — D62 Acute posthemorrhagic anemia: Secondary | ICD-10-CM

## 2020-07-28 DIAGNOSIS — I639 Cerebral infarction, unspecified: Secondary | ICD-10-CM

## 2020-07-28 DIAGNOSIS — I6602 Occlusion and stenosis of left middle cerebral artery: Secondary | ICD-10-CM

## 2020-07-28 DIAGNOSIS — S31139A Puncture wound of abdominal wall without foreign body, unspecified quadrant without penetration into peritoneal cavity, initial encounter: Secondary | ICD-10-CM

## 2020-07-28 DIAGNOSIS — J9600 Acute respiratory failure, unspecified whether with hypoxia or hypercapnia: Secondary | ICD-10-CM

## 2020-07-28 DIAGNOSIS — I615 Nontraumatic intracerebral hemorrhage, intraventricular: Secondary | ICD-10-CM

## 2020-07-28 DIAGNOSIS — G936 Cerebral edema: Secondary | ICD-10-CM

## 2020-07-28 DIAGNOSIS — N186 End stage renal disease: Secondary | ICD-10-CM

## 2020-07-28 DIAGNOSIS — I63232 Cerebral infarction due to unspecified occlusion or stenosis of left carotid arteries: Principal | ICD-10-CM

## 2020-07-28 DIAGNOSIS — I4891 Unspecified atrial fibrillation: Secondary | ICD-10-CM

## 2020-07-28 DIAGNOSIS — I6389 Other cerebral infarction: Secondary | ICD-10-CM

## 2020-07-28 DIAGNOSIS — I4811 Longstanding persistent atrial fibrillation: Secondary | ICD-10-CM

## 2020-07-28 DIAGNOSIS — J9601 Acute respiratory failure with hypoxia: Secondary | ICD-10-CM | POA: Diagnosis not present

## 2020-07-28 DIAGNOSIS — D699 Hemorrhagic condition, unspecified: Secondary | ICD-10-CM

## 2020-07-28 DIAGNOSIS — G935 Compression of brain: Secondary | ICD-10-CM

## 2020-07-28 DIAGNOSIS — I609 Nontraumatic subarachnoid hemorrhage, unspecified: Secondary | ICD-10-CM

## 2020-07-28 LAB — CBC WITH DIFFERENTIAL/PLATELET
Abs Immature Granulocytes: 0.03 10*3/uL (ref 0.00–0.07)
Abs Immature Granulocytes: 0.04 10*3/uL (ref 0.00–0.07)
Basophils Absolute: 0 10*3/uL (ref 0.0–0.1)
Basophils Absolute: 0 10*3/uL (ref 0.0–0.1)
Basophils Relative: 1 %
Basophils Relative: 0 %
Eosinophils Absolute: 0 10*3/uL (ref 0.0–0.5)
Eosinophils Absolute: 0 10*3/uL (ref 0.0–0.5)
Eosinophils Relative: 0 %
Eosinophils Relative: 0 %
HCT: 30.4 % — ABNORMAL LOW (ref 36.0–46.0)
HCT: 26.7 % — ABNORMAL LOW (ref 36.0–46.0)
Hemoglobin: 9.5 g/dL — ABNORMAL LOW (ref 12.0–15.0)
Hemoglobin: 8.3 g/dL — ABNORMAL LOW (ref 12.0–15.0)
Immature Granulocytes: 0 %
Immature Granulocytes: 0 %
Lymphocytes Relative: 15 %
Lymphocytes Relative: 14 %
Lymphs Abs: 1.2 10*3/uL (ref 0.7–4.0)
Lymphs Abs: 1.3 10*3/uL (ref 0.7–4.0)
MCH: 29.6 pg (ref 26.0–34.0)
MCH: 29.2 pg (ref 26.0–34.0)
MCHC: 31.3 g/dL (ref 30.0–36.0)
MCHC: 31.1 g/dL (ref 30.0–36.0)
MCV: 94.7 fL (ref 80.0–100.0)
MCV: 94 fL (ref 80.0–100.0)
Monocytes Absolute: 0.7 10*3/uL (ref 0.1–1.0)
Monocytes Absolute: 0.9 10*3/uL (ref 0.1–1.0)
Monocytes Relative: 9 %
Monocytes Relative: 10 %
Neutro Abs: 6.2 10*3/uL (ref 1.7–7.7)
Neutro Abs: 7.4 10*3/uL (ref 1.7–7.7)
Neutrophils Relative %: 75 %
Neutrophils Relative %: 76 %
Platelets: 189 10*3/uL (ref 150–400)
Platelets: 174 10*3/uL (ref 150–400)
RBC: 3.21 MIL/uL — ABNORMAL LOW (ref 3.87–5.11)
RBC: 2.84 MIL/uL — ABNORMAL LOW (ref 3.87–5.11)
RDW: 15.8 % — ABNORMAL HIGH (ref 11.5–15.5)
RDW: 15.7 % — ABNORMAL HIGH (ref 11.5–15.5)
WBC: 8.1 10*3/uL (ref 4.0–10.5)
WBC: 9.7 10*3/uL (ref 4.0–10.5)
nRBC: 0 % (ref 0.0–0.2)
nRBC: 0 % (ref 0.0–0.2)

## 2020-07-28 LAB — HEMOGLOBIN A1C
Hgb A1c MFr Bld: 8.5 % — ABNORMAL HIGH (ref 4.8–5.6)
Mean Plasma Glucose: 197.25 mg/dL

## 2020-07-28 LAB — PROTIME-INR
INR: 2 — ABNORMAL HIGH (ref 0.8–1.2)
INR: 1.6 — ABNORMAL HIGH (ref 0.8–1.2)
Prothrombin Time: 22.1 seconds — ABNORMAL HIGH (ref 11.4–15.2)
Prothrombin Time: 18.6 seconds — ABNORMAL HIGH (ref 11.4–15.2)

## 2020-07-28 LAB — CBC
HCT: 27.1 % — ABNORMAL LOW (ref 36.0–46.0)
Hemoglobin: 8.4 g/dL — ABNORMAL LOW (ref 12.0–15.0)
MCH: 29.5 pg (ref 26.0–34.0)
MCHC: 31 g/dL (ref 30.0–36.0)
MCV: 95.1 fL (ref 80.0–100.0)
Platelets: 172 10*3/uL (ref 150–400)
RBC: 2.85 MIL/uL — ABNORMAL LOW (ref 3.87–5.11)
RDW: 15.8 % — ABNORMAL HIGH (ref 11.5–15.5)
WBC: 8.5 10*3/uL (ref 4.0–10.5)
nRBC: 0 % (ref 0.0–0.2)

## 2020-07-28 LAB — FIBRINOGEN
Fibrinogen: <60 mg/dL — CL (ref 210–475)
Fibrinogen: <60 mg/dL — CL (ref 210–475)

## 2020-07-28 LAB — ECHOCARDIOGRAM COMPLETE
Area-P 1/2: 2.09 cm2
S' Lateral: 2.1 cm
Weight: 3518.4 oz

## 2020-07-28 LAB — BASIC METABOLIC PANEL
Anion gap: 14 (ref 5–15)
Anion gap: 13 (ref 5–15)
BUN: 85 mg/dL — ABNORMAL HIGH (ref 8–23)
BUN: 89 mg/dL — ABNORMAL HIGH (ref 8–23)
CO2: 18 mmol/L — ABNORMAL LOW (ref 22–32)
CO2: 20 mmol/L — ABNORMAL LOW (ref 22–32)
Calcium: 8.4 mg/dL — ABNORMAL LOW (ref 8.9–10.3)
Calcium: 8.3 mg/dL — ABNORMAL LOW (ref 8.9–10.3)
Chloride: 108 mmol/L (ref 98–111)
Chloride: 108 mmol/L (ref 98–111)
Creatinine, Ser: 4.47 mg/dL — ABNORMAL HIGH (ref 0.44–1.00)
Creatinine, Ser: 4.31 mg/dL — ABNORMAL HIGH (ref 0.44–1.00)
GFR, Estimated: 10 mL/min — ABNORMAL LOW (ref >60)
GFR, Estimated: 10 mL/min — ABNORMAL LOW (ref >60)
Glucose, Bld: 246 mg/dL — ABNORMAL HIGH (ref 70–99)
Glucose, Bld: 234 mg/dL — ABNORMAL HIGH (ref 70–99)
Potassium: 4 mmol/L (ref 3.5–5.1)
Potassium: 4 mmol/L (ref 3.5–5.1)
Sodium: 140 mmol/L (ref 135–145)
Sodium: 141 mmol/L (ref 135–145)

## 2020-07-28 LAB — GLUCOSE, CAPILLARY: Glucose-Capillary: 201 mg/dL — ABNORMAL HIGH (ref 70–99)

## 2020-07-28 LAB — TYPE AND SCREEN
ABO/RH(D): A POS
Antibody Screen: NEGATIVE

## 2020-07-28 LAB — LIPID PANEL
Cholesterol: 178 mg/dL (ref 0–200)
HDL: 52 mg/dL (ref >40)
LDL Cholesterol: 82 mg/dL (ref 0–99)
Total CHOL/HDL Ratio: 3.4 RATIO
Triglycerides: 222 mg/dL — ABNORMAL HIGH (ref <150)
VLDL: 44 mg/dL — ABNORMAL HIGH (ref 0–40)

## 2020-07-28 LAB — SODIUM
Sodium: 142 mmol/L (ref 135–145)
Sodium: 147 mmol/L — ABNORMAL HIGH (ref 135–145)

## 2020-07-28 LAB — POCT I-STAT, CHEM 8
BUN: 104 mg/dL — ABNORMAL HIGH (ref 8–23)
Calcium, Ion: 1.24 mmol/L (ref 1.15–1.40)
Chloride: 106 mmol/L (ref 98–111)
Creatinine, Ser: 4.2 mg/dL — ABNORMAL HIGH (ref 0.44–1.00)
Glucose, Bld: 314 mg/dL — ABNORMAL HIGH (ref 70–99)
HCT: 37 % (ref 36.0–46.0)
Hemoglobin: 12.6 g/dL (ref 12.0–15.0)
Potassium: 5.3 mmol/L — ABNORMAL HIGH (ref 3.5–5.1)
Sodium: 142 mmol/L (ref 135–145)
TCO2: 27 mmol/L (ref 22–32)

## 2020-07-28 LAB — TRIGLYCERIDES: Triglycerides: 218 mg/dL — ABNORMAL HIGH (ref <150)

## 2020-07-28 LAB — D-DIMER, QUANTITATIVE: D-Dimer, Quant: >20 ug/mL-FEU — ABNORMAL HIGH (ref 0.00–0.50)

## 2020-07-28 LAB — APTT
aPTT: 39 seconds — ABNORMAL HIGH (ref 24–36)
aPTT: 36 seconds (ref 24–36)

## 2020-07-28 MED ORDER — TRANEXAMIC ACID-NACL 1000-0.7 MG/100ML-% IV SOLN
1000.0000 mg | INTRAVENOUS | Status: AC
  Administered 2020-07-28: 1000 mg via INTRAVENOUS
  Filled 2020-07-28: qty 100

## 2020-07-28 MED ORDER — "THROMBI-PAD 3""X3"" EX PADS"
1.0000 | MEDICATED_PAD | Freq: Once | CUTANEOUS | Status: AC
  Administered 2020-07-28: 1 via TOPICAL
  Filled 2020-07-28: qty 1

## 2020-07-28 MED ORDER — GLYCOPYRROLATE 0.2 MG/ML IJ SOLN: 0.2000 mg | INTRAMUSCULAR | Status: DC | PRN

## 2020-07-28 MED ORDER — HYDRALAZINE HCL 50 MG PO TABS: 50.0000 mg | ORAL_TABLET | Freq: Three times a day (TID) | ORAL | Status: DC

## 2020-07-28 MED ORDER — POLYVINYL ALCOHOL 1.4 % OP SOLN
1.0000 [drp] | Freq: Four times a day (QID) | OPHTHALMIC | Status: DC | PRN
  Filled 2020-07-28: qty 15

## 2020-07-28 MED ORDER — DIPHENHYDRAMINE HCL 50 MG/ML IJ SOLN: 25.0000 mg | INTRAMUSCULAR | Status: DC | PRN

## 2020-07-28 MED ORDER — SODIUM CHLORIDE 3 % IV SOLN
INTRAVENOUS | Status: DC
  Filled 2020-07-28 (×3): qty 500

## 2020-07-28 MED ORDER — GLYCOPYRROLATE 1 MG PO TABS
1.0000 mg | ORAL_TABLET | ORAL | Status: DC | PRN
  Filled 2020-07-28: qty 1

## 2020-07-28 MED ORDER — SODIUM CHLORIDE 23.4 % INJECTION (4 MEQ/ML) FOR IV ADMINISTRATION
120.0000 meq | Freq: Once | INTRAVENOUS | Status: DC
  Filled 2020-07-28: qty 30

## 2020-07-28 MED ORDER — MORPHINE SULFATE (PF) 2 MG/ML IV SOLN
2.0000 mg | INTRAVENOUS | Status: DC | PRN
  Administered 2020-07-28: 4 mg via INTRAVENOUS
  Filled 2020-07-28: qty 2

## 2020-07-28 MED ORDER — SODIUM CHLORIDE 0.9% IV SOLUTION: Freq: Once | INTRAVENOUS | Status: AC

## 2020-07-28 MED ORDER — LORAZEPAM 2 MG/ML IJ SOLN
2.0000 mg | INTRAMUSCULAR | Status: DC | PRN
  Administered 2020-07-28: 2 mg via INTRAVENOUS
  Filled 2020-07-28: qty 1

## 2020-07-29 LAB — PREPARE CRYOPRECIPITATE
Unit division: 0
Unit division: 0

## 2020-07-29 LAB — BPAM CRYOPRECIPITATE
Blood Product Expiration Date: 202111031345
Blood Product Expiration Date: 202111031345
ISSUE DATE / TIME: 202111030845
ISSUE DATE / TIME: 202111030845
Unit Type and Rh: 6200
Unit Type and Rh: 6200

## 2020-07-29 LAB — PREPARE FRESH FROZEN PLASMA: Unit division: 0

## 2020-07-29 LAB — BPAM FFP
Blood Product Expiration Date: 202111032359
Blood Product Expiration Date: 202111042359
ISSUE DATE / TIME: 202111030437
ISSUE DATE / TIME: 202111030437
Unit Type and Rh: 6200
Unit Type and Rh: 6200

## 2020-07-29 LAB — ABO/RH: ABO/RH(D): A POS

## 2020-07-29 LAB — GLUCOSE, CAPILLARY: Glucose-Capillary: 235 mg/dL — ABNORMAL HIGH (ref 70–99)

## 2020-07-29 LAB — PATHOLOGIST SMEAR REVIEW

## 2020-08-25 NOTE — Progress Notes (Signed)
Dr. Estanislado Pandy at bedside sheath pulled at [phone].

## 2020-08-25 NOTE — Progress Notes (Addendum)
Groin sheath has been pulled by Dr. Estanislado Pandy. Pressure being maintained at right groin site for hemostasis. Left pupil now slightly larger by about 1 mm since prior exam and continues to be sluggish with right pupil remaining stable at 3 mm. Suspect hemorrhagic conversion of the left MCA stroke. Plan is for CT at 9 AM to allow sufficient time for groin site to stabilize regarding the bleeding. Unable to obtain CT STAT as there is a high risk for rebleeding if the patient is moved prior to 2 hours following completion of initial pressure to groin site. Discussed with Dr. Estanislado Pandy.   If CT is positive for hemorrhagic conversion with mass effect, will discuss with Neurosurgery. However, unlikely that she would be a candidate due to advanced age and significant medical comorbidities.  Addendum: - Due to enlarging left pupil, mass effect from either hemorrhagic conversion or swelling of the left MCA stroke is likely. Starting 3% saline at 50 cc/hr - Repeat INR, PTT, PT and fibrinogen are pending.  - Discussed with Dr. Curly Shores in sign out  Electronically signed: Dr. Kerney Elbe

## 2020-08-25 NOTE — Progress Notes (Addendum)
Per Dr. Estanislado Pandy do not take pt to CT until 2 hours post hemostasis.   Pressure held by Dr. Estanislado Pandy until hemostasis was achieved at 0635.

## 2020-08-25 NOTE — Progress Notes (Addendum)
IO requested for IV medication.  Multiple medications currently infusing via PIV's.  Transporting pt to CT currently. Recommended to  Dr Curly Shores in person to have a CVC placed preferably over IO.  No response.  RN notified to place stat IV consult when pt returns from CT. Medication that the IO was requested for is not yet available for infusion.  A NP on unit offering to place a CVC.

## 2020-08-25 NOTE — Progress Notes (Signed)
Inpatient Diabetes Program Recommendations  AACE/ADA: New Consensus Statement on Inpatient Glycemic Control (2015)  Target Ranges:  Prepandial:   less than 140 mg/dL      Peak postprandial:   less than 180 mg/dL (1-2 hours)      Critically ill patients:  140 - 180 mg/dL   Lab Results  Component Value Date   GLUCAP 201 (H) 08-25-20   HGBA1C 8.5 (H) August 25, 2020    Review of Glycemic Control Results for Cassandra Parker, Cassandra Parker (MRN [id number]) as of 08-25-2020 11:06  Ref. Range 07/26/2020 14:59 08/24/2020 19:38 08/23/2020 23:33 25-Aug-2020 03:21  Glucose-Capillary Latest Ref Range: 70 - 99 mg/dL 366 (H) 238 (H) 263 (H) 201 (H)   Diabetes history: Type 2 DM Outpatient Diabetes medications: Glipizide 10 mg QAM, 5 mg QPM Current orders for Inpatient glycemic control: Novolog 0-6 units Q4H  Inpatient Diabetes Program Recommendations:    Consider adding Levemir 8 units QD.   Thanks, Bronson Curb, MSN, RNC-OB Diabetes Coordinator [phone] (8a-5p)

## 2020-08-25 NOTE — Discharge Instructions (Signed)
Femoral Site Care This sheet gives you information about how to care for yourself after your procedure. Your health care provider may also give you more specific instructions. If you have problems or questions, contact your health care provider. What can I expect after the procedure? After the procedure, it is common to have:  Bruising that usually fades within 1-2 weeks.  Tenderness at the site. Follow these instructions at home: Wound care 1. Follow instructions from your health care provider about how to take care of your insertion site. Make sure you: ? Wash your hands with soap and water before you change your bandage (dressing). If soap and water are not available, use hand sanitizer. ? Change your dressing as directed- pressure dressing removed 24 hours post-procedure (and switch for bandaid), bandaid removed 72 hours post-procedure 2. Do not take baths, swim, or use a hot tub for 7 days post-procedure. 3. You may shower 48 hours after the procedure or as told by your health care provider. ? Gently wash the site with plain soap and water. ? Pat the area dry with a clean towel. ? Do not rub the site. This may cause bleeding. 4. Check your site every day for signs of infection. Check for: ? Redness, swelling, or pain. ? Fluid or blood. ? Warmth. ? Pus or a bad smell. Activity  Do not stoop, bend, or lift anything that is heavier than 10 lb (4.5 kg) for 2 weeks post-procedure.  Do not drive self for 2 weeks post-procedure. Contact a health care provider if you have:  A fever or chills.  You have redness, swelling, or pain around your insertion site. Get help right away if:  The catheter insertion area swells very fast.  You pass out.  You suddenly start to sweat or your skin gets clammy.  The catheter insertion area is bleeding, and the bleeding does not stop when you hold steady pressure on the area.  The area near or just beyond the catheter insertion site becomes  pale, cool, tingly, or numb. These symptoms may represent a serious problem that is an emergency. Do not wait to see if the symptoms will go away. Get medical help right away. Call your local emergency services (911 in the [place] Do not drive yourself to the hospital.  This information is not intended to replace advice given to you by your health care provider. Make sure you discuss any questions you have with your health care provider. Document Revised: 09/24/2017 Document Reviewed: 09/24/2017 Elsevier Patient Education  2020 [employer] 

## 2020-08-25 NOTE — Progress Notes (Signed)
0400 still holding pressure, access bleeding noted at site. Awaiting to receive and administer FFP. Per Dr Cheral Marker will reasses l;abs post transfusion

## 2020-08-25 NOTE — Progress Notes (Signed)
CRITICAL VALUE ALERT  Critical Value: <60 fribrinogen  Date & Time Notied:  08/10/20 0355  Provider Notified: Dr. Cheral Marker  Orders Received/Actions taken: Awaiting FFP, follow up labs post transfusion

## 2020-08-25 NOTE — Significant Event (Signed)
  PCCM Interval Note/ Plan of Care  Patient's three children are at bedside.  They were deciding between transitioning to comfort care today versus waiting till tomorrow.  We discussed what transitioning to comfort care looks like and allowing her a natural passing with her loved ones at bedside with analgesia/ anxiolytics.  Additionally, we discussed waiting if the family needed more time, however also discussing that patient could pass on her own in the interim despite continuing current care.    I did switch patient from full MV support to PSV and patient is triggering vent but with low TVs and therefore could be minutes to hours once one-way extubated.    After discussing among themselves, they have elected to proceed with one-way extubation and comfort care today.  They are waiting on one more family member to arrive.    All questions answered.  Ongoing emotional support provided.       P:  Orders changed to reflect transition to full comfort care and one-way extubation when family is ready Prn morphine, ativan, and robinul.  Can consider morphine gtt if needed.   Ongoing family support   Additional CCT 15 mins  Kennieth Rad, ACNP [facility] Jul 30, 2020, 4:09 PM  See Amion for personal pager PCCM on call pager [phone]

## 2020-08-25 NOTE — Progress Notes (Signed)
Spoke with Dr. Cheral Marker is would not be beneficial to reverse tPA at this point due to the short half life. Dr. Cheral Marker to page vascular surgery.

## 2020-08-25 NOTE — Plan of Care (Signed)
Pager by RN that pt passed away at [phone] with family at bedside. Family has left when I came by. Death certificate signed later and handed over to RN at bedside.  Rosalin Hawking, MD PhD Stroke Neurology Aug 15, 2020 5:39 PM

## 2020-08-25 NOTE — Progress Notes (Signed)
STROKE TEAM PROGRESS NOTE   INTERVAL HISTORY Daughter and RN are at the bedside. Pt unresponsive, came back from CT and it showed significant large stroke on the left MCA and diffuse SAH with significant midline shift at 2cm. NSG was called but unlikely any surgery will help at this time. She will get TXA and cryo as ordered. BP goal < 140. However, I had long discussion with daughter at bedside, told her the poor prognosis. She wants to discuss with her brother and sister about no heroic measures at this time due to poor prognosis. Will hold off central line, and change code to DNR, but she will get back to [place] after she discussed with family.   Vitals:   08/19/2020 0600 2020/08/19 0700 Aug 19, 2020 0704 08/19/20 0757  BP:  (!) 118/46    Pulse: 60 60 60 60  Resp: 18 18 18 18   Temp:      TempSrc:      SpO2: 98% 99% 98% 99%  Weight:       CBC:  Recent Labs  Lab 08/15/2020 1641 08/19/2020 1641 August 19, 2020 0215 08-19-20 0553  WBC 10.4   < > 8.1 8.5  NEUTROABS 8.2*  --  6.2  --   HGB 11.5*   < > 9.5* 8.4*  HCT 37.8   < > 30.4* 27.1*  MCV 96.9   < > 94.7 95.1  PLT 210   < > 189 172   < > = values in this interval not displayed.   Basic Metabolic Panel:  Recent Labs  Lab 08/19/2020 0215 08-19-20 0553  NA 140 141  K 4.0 4.0  CL 108 108  CO2 18* 20*  GLUCOSE 246* 234*  BUN 85* 89*  CREATININE 4.47* 4.31*  CALCIUM 8.4* 8.3*   Lipid Panel:  Recent Labs  Lab August 19, 2020 0215  CHOL 178  TRIG 222*  218*  HDL 52  CHOLHDL 3.4  VLDL 44*  LDLCALC 82   HgbA1c:  Recent Labs  Lab 08-19-2020 0215  HGBA1C 8.5*   Urine Drug Screen: No results for input(s): LABOPIA, COCAINSCRNUR, LABBENZ, AMPHETMU, THCU, LABBARB in the last 168 hours.  Alcohol Level No results for input(s): ETH in the last 168 hours.  IMAGING past 24 hours CT Angio Head W or Wo Contrast  Result Date: 07/29/2020 CLINICAL DATA:  79 year old female code stroke presentation with loss of speech, right side weakness. Hyperdense left  ICA terminus and left M1 on plain head CT. EXAM: CT ANGIOGRAPHY HEAD AND NECK TECHNIQUE: Multidetector CT imaging of the head and neck was performed using the standard protocol during bolus administration of intravenous contrast. Multiplanar CT image reconstructions and MIPs were obtained to evaluate the vascular anatomy. Carotid stenosis measurements (when applicable) are obtained utilizing NASCET criteria, using the distal internal carotid diameter as the denominator. CONTRAST:  104mL OMNIPAQUE IOHEXOL 350 MG/ML SOLN COMPARISON:  Plain head CT at 1426 hours today. FINDINGS: CTA NECK Skeleton: Mostly absent dentition. Advanced lower cervical spine degeneration. No acute osseous abnormality identified. Upper chest: Left chest cardiac pacemaker type device. Otherwise negative. Other neck: Negative; partially retropharyngeal course of both carotids. Aortic arch: 3 vessel arch configuration. Minimal arch atherosclerosis. Right carotid system: Minimal brachiocephalic artery plaque without stenosis. Mild motion artifact at the thoracic inlet. Mildly tortuous proximal right CCA. No definite stenosis. Retropharyngeal course of the right CCA at the level of the larynx and pharynx. Minimal calcified plaque at the bifurcation affecting the ECA. No right ICA plaque or stenosis. Tortuous right  ICA at the C2-C3 level. Left carotid system: Negative left CCA origin. Tortuous proximal left CCA and retropharyngeal course similar to that on the right. At the left carotid bifurcation there is mild soft and calcified plaque. The left ICA origin and bulb are patent, but the vessel appears gradually occluded distal to the bulb. There is no enhancement at the skull base. See intracranial findings below. Vertebral arteries: Negative proximal right subclavian artery. Mild calcified plaque at the right vertebral artery origin without stenosis. Patent right vertebral artery to the skull base without additional plaque or stenosis. Mild plaque  in the proximal left subclavian artery without stenosis. Similar minimal calcified plaque at the left vertebral artery origin without stenosis. Dominant left vertebral artery with a tortuous V1 segment. Patent left vertebral to the skull base without stenosis. CTA HEAD Posterior circulation: Patent distal vertebral arteries, the left is mildly dominant. Patent PICA origins. No distal vertebral plaque or stenosis. Patent vertebrobasilar junction and basilar artery without stenosis. Patent SCA and PCA origins. Posterior communicating arteries are diminutive or absent. Right PCA branches are within normal limits. The left PCA is mildly irregular but otherwise within normal limits. Anterior circulation: Occluded left ICA siphon with superimposed mild to moderate calcified plaque. Left ICA terminus is occluded. Left MCA and ACA origins are occluded. There is no significant reconstitution of the left MCA. The left ACA is reconstituted in the A1 segment. Anterior communicating artery appears normal. Bilateral ACA branches are within normal limits. Preliminary report of the above discussed by telephone with Dr. Elnora Morrison on 08/05/2020 at 15:05 . Patent right ICA siphon with mild to moderate calcified plaque. Only mild supraclinoid stenosis. Patent right ICA terminus. Patent right MCA and ACA origins. The right A1 is within normal limits. Right MCA M1 segment and trifurcation are patent without stenosis. Right MCA branches are within normal limits. Venous sinuses: Patent. Anatomic variants: Dominant left vertebral artery. Review of the MIP images confirms the above findings IMPRESSION: 1. Positive for Emergent Large Vessel Occlusion: Left ICA T-occlusion. LICA occluded in the neck and at the terminus along with the Left MCA and ACA origins. The left ACA is reconstituted but the MCA is not. This was discussed by telephone with Dr. Elnora Morrison on 08/01/2020 at 15:05 . 2. Underlying mild for age atherosclerosis. Calcified  plaque most notable in both ICA siphons. 3. Left chest pacemaker. Electronically Signed   By: Genevie Ann M.D.   On: 08/02/2020 15:13   CT HEAD WO CONTRAST  Result Date: 18-Aug-2020 CLINICAL DATA:  79 year old female status post E LV 0, left ICA T occlusion yesterday. Transferred from [facility] status post NIR. EXAM: CT HEAD WITHOUT CONTRAST TECHNIQUE: Contiguous axial images were obtained from the base of the skull through the vertex without intravenous contrast. COMPARISON:  Code stroke presentation head CT 1426 hours yesterday. FINDINGS: Brain: Severe new intracranial mass effect. Leftward midline shift of 18 mm with sub fall seen herniation and partially effaced basilar cisterns. Large left MCA territory infarct although with abundant hyperdense parenchyma. Some of this is related to malignant hemorrhagic transformation. A component of parenchymal contrast staining is also possible. Superimposed left ACA territory infarct has developed probably from the subfalcine herniation (series 3, image 19). Effaced left and trapped right lateral ventricles. No intraventricular hemorrhage identified. However, there is evidence of bilateral subarachnoid hemorrhage. Less likely the appearance might be a pseudo SAH (related to diffuse venous congestion. No right hemisphere or cerebellar infarct is evident, although there is suspicion  of edema in the bilateral thalami and brainstem. Vascular: Calcified atherosclerosis at the skull base. There seems to be residual intravascular contrast present in the major arterial and venous structures. Skull: No acute osseous abnormality identified. Sinuses/Orbits: Visualized paranasal sinuses and mastoids are stable and well pneumatized. Other: No acute orbit or scalp soft tissue findings. IMPRESSION: 1. Severe intracranial mass effect and severe infarction of the left hemisphere. 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 New left ACA infarct which  may be subfalcine herniation related. 2. Rightward midline shift of 18 mm with trapping of the right lateral ventricle and partially effaced basilar cisterns. 3. Suspicion of superimposed bilateral subarachnoid hemorrhage, although residual intravascular contrast is present and pseudo-SAH from venous congestion is also possible. Critical Value/emergent results were called by telephone at the time of interpretation on 07-30-20 at 0824 hours to Dr. Erlinda Hong who verbally acknowledged these results. Electronically Signed   By: Genevie Ann M.D.   On: 07/30/2020 08:51   CT Angio Neck W and/or Wo Contrast  Result Date: 08/18/2020 CLINICAL DATA:  79 year old female code stroke presentation with loss of speech, right side weakness. Hyperdense left ICA terminus and left M1 on plain head CT. EXAM: CT ANGIOGRAPHY HEAD AND NECK TECHNIQUE: Multidetector CT imaging of the head and neck was performed using the standard protocol during bolus administration of intravenous contrast. Multiplanar CT image reconstructions and MIPs were obtained to evaluate the vascular anatomy. Carotid stenosis measurements (when applicable) are obtained utilizing NASCET criteria, using the distal internal carotid diameter as the denominator. CONTRAST:  31mL OMNIPAQUE IOHEXOL 350 MG/ML SOLN COMPARISON:  Plain head CT at 1426 hours today. FINDINGS: CTA NECK Skeleton: Mostly absent dentition. Advanced lower cervical spine degeneration. No acute osseous abnormality identified. Upper chest: Left chest cardiac pacemaker type device. Otherwise negative. Other neck: Negative; partially retropharyngeal course of both carotids. Aortic arch: 3 vessel arch configuration. Minimal arch atherosclerosis. Right carotid system: Minimal brachiocephalic artery plaque without stenosis. Mild motion artifact at the thoracic inlet. Mildly tortuous proximal right CCA. No definite stenosis. Retropharyngeal course of the right CCA at the level of the larynx and pharynx. Minimal  calcified plaque at the bifurcation affecting the ECA. No right ICA plaque or stenosis. Tortuous right ICA at the C2-C3 level. Left carotid system: Negative left CCA origin. Tortuous proximal left CCA and retropharyngeal course similar to that on the right. At the left carotid bifurcation there is mild soft and calcified plaque. The left ICA origin and bulb are patent, but the vessel appears gradually occluded distal to the bulb. There is no enhancement at the skull base. See intracranial findings below. Vertebral arteries: Negative proximal right subclavian artery. Mild calcified plaque at the right vertebral artery origin without stenosis. Patent right vertebral artery to the skull base without additional plaque or stenosis. Mild plaque in the proximal left subclavian artery without stenosis. Similar minimal calcified plaque at the left vertebral artery origin without stenosis. Dominant left vertebral artery with a tortuous V1 segment. Patent left vertebral to the skull base without stenosis. CTA HEAD Posterior circulation: Patent distal vertebral arteries, the left is mildly dominant. Patent PICA origins. No distal vertebral plaque or stenosis. Patent vertebrobasilar junction and basilar artery without stenosis. Patent SCA and PCA origins. Posterior communicating arteries are diminutive or absent. Right PCA branches are within normal limits. The left PCA is mildly irregular but otherwise within normal limits. Anterior circulation: Occluded left ICA siphon with superimposed mild to moderate calcified plaque. Left ICA terminus is occluded.  Left MCA and ACA origins are occluded. There is no significant reconstitution of the left MCA. The left ACA is reconstituted in the A1 segment. Anterior communicating artery appears normal. Bilateral ACA branches are within normal limits. Preliminary report of the above discussed by telephone with Dr. Elnora Morrison on 08/02/2020 at 15:05 . Patent right ICA siphon with mild to  moderate calcified plaque. Only mild supraclinoid stenosis. Patent right ICA terminus. Patent right MCA and ACA origins. The right A1 is within normal limits. Right MCA M1 segment and trifurcation are patent without stenosis. Right MCA branches are within normal limits. Venous sinuses: Patent. Anatomic variants: Dominant left vertebral artery. Review of the MIP images confirms the above findings IMPRESSION: 1. Positive for Emergent Large Vessel Occlusion: Left ICA T-occlusion. LICA occluded in the neck and at the terminus along with the Left MCA and ACA origins. The left ACA is reconstituted but the MCA is not. This was discussed by telephone with Dr. Elnora Morrison on 08/19/2020 at 15:05 . 2. Underlying mild for age atherosclerosis. Calcified plaque most notable in both ICA siphons. 3. Left chest pacemaker. Electronically Signed   By: Genevie Ann M.D.   On: 08/20/2020 15:13   DG CHEST PORT 1 VIEW  Result Date: 08/04/2020 CLINICAL DATA:  Respiratory failure, hypoxic EXAM: PORTABLE CHEST 1 VIEW COMPARISON:  Is 06/05/2020 FINDINGS: None endotracheal tube is been placed with its tip 18 mm above the carina. Lung volumes are small, but are symmetric and are clear. Previously noted perihilar pulmonary infiltrate has resolved. No pneumothorax or pleural effusion. Cardiac size is mildly enlarged, unchanged. Left subclavian pacemaker is unchanged. Pulmonary vascularity is normal. No acute bone abnormality. IMPRESSION: Endotracheal tube 18 mm above the carina. Pulmonary hypoinflation. Resolved pulmonary infiltrate. Electronically Signed   By: Fidela Salisbury MD   On: 08/14/2020 19:19   CT HEAD CODE STROKE WO CONTRAST  Result Date: 08/08/2020 CLINICAL DATA:  Code stroke. Neuro deficit, acute stroke suspected. Altered mental status. Unable to speak or move right side. EXAM: CT HEAD WITHOUT CONTRAST TECHNIQUE: Contiguous axial images were obtained from the base of the skull through the vertex without intravenous contrast.  COMPARISON:  CT head 06/05/2020. FINDINGS: Brain: Evaluation is limited by motion; however, possible mild hypodensity of the left insula. No acute hemorrhage. Similar appearance of a remote high left frontal cortical infarct. Additional patchy white matter hypoattenuation, compatible with chronic microvascular ischemic disease. Vascular: Hyperdense left ICA terminus and proximal M1 MCA, concerning for thrombus. Calcific atherosclerosis. Skull: No acute fracture. Sinuses/Orbits: No acute findings. Other: No mastoid effusion. ASPECTS [facility] Stroke Program Early CT Score) - Ganglionic level infarction (caudate, lentiform nuclei, internal capsule, insula, M1-M3 cortex): 6 - Supraganglionic infarction (M4-M6 cortex): 3 Total score (0-10 with 10 being normal): 9 IMPRESSION: 1. Hyperdense left ICA terminus and proximal left M1 MCA, concerning for thrombus. See forthcoming ordered CTA for further evaluation. 2. Evaluation is limited by patient motion; however, possible mild hypodensity of the left insula, as can be seen with acute MCA territory infarct. ASPECTS is 9. 3. No acute hemorrhage. Code stroke imaging results were communicated on 08/18/2020 at 2:41 pm to provider Dr. Reather Converse Via telephone, who verbally acknowledged these results. Electronically Signed   By: Margaretha Sheffield MD   On: 08/02/2020 14:48    PHYSICAL EXAM  Temp:  [93 F (33.9 C)-98.4 F (36.9 C)] 97.3 F (36.3 C) (11/03 0936) Pulse Rate:  [58-67] 60 (11/03 0921) Resp:  [16-29] 18 (11/03 0921) BP: (36-132)/(16-96) 121/55 (11/03  0906) SpO2:  [85 %-100 %] 97 % (11/03 1504) Arterial Line BP: (120-155)/(38-49) 134/42 (11/03 0921) FiO2 (%):  [40 %-50 %] 40 % (11/03 1504)  General - Well nourished, well developed, intubated off sedation.  Ophthalmologic - fundi not visualized due to noncooperation.  Cardiovascular - irregularly irregular heart rate and rhythm.  Neuro - intubated off sedation, eyes closed, not following commands. With  forced eye opening, eyes in mid position, not blinking to visual threat, doll's eyes absent, not tracking, bilateral pupils 54mm, right irregular shape, not reactive to light. Corneal reflex absent, gag and cough present. Breathing not over the vent.  Facial symmetry not able to test due to ET tube.  Tongue protrusion not cooperative. On pain stimulation, extension posturing BUEs and slight DF at bilateral ankle. DTR 1+ and no babinski. Sensation, coordination and gait not tested.   ASSESSMENT/PLAN Cassandra Parker is a 79 y.o. female with history of AF off AC d/t recent GIB,breast cancer, cardiomyopathy, complete heart block s/p pacemaker, chronic combined systolic and diastolic congestive heart failure, diabetes with neuropathy, hypertension, hypercholesterolemia, mitral stenosis with trivial MR, ESRD on intermittent HD w/ RUE fistula, hospitalized recently for PNA found down in the bathroom, presenting to [facility] : [facility] R sided weakness and inability to talk. Received IV tPA 07/30/2020 at 1500. Found to have T occlusion and transferred to [facility] for mechanical thrombectomy.    Stroke:  L MCA/ACA large infarct due to left T occlusion s/p tPA and IR with TICI2c. Infarct felt to be w/ embolic d/t w/ AF off AC d/t recent GIB.  Hemorrhagic transformation and IVH Diffuse SAH  Code Stroke CT head hyperdense L ICA terminus and proximal L M1. Mild hypodensity L insula. ASPECTS 9.    CTA head & neck ELVO L ICA T occlusion w/ occlusion in neck. L ACA reconstituted, MCA is not. Mild atherosclerosis. Plaque B ICA siphons  Cerebral angio TICI2c revascularization L MCA, L ICA w/ 1 pass solitaire   Post IR CT L MCA cortical hyperemia w/ small L sylvian SAH.   Repeat CT head w/ neuro worsening large L MCA infarct w/ severe mass effect and hemorrhagic transformation, new L ACA infarct. 38mm R shift w/ trapping R ventricle and partially effaced basilar cisterns. Intraventricular  contrast present. Possible B SAH vs constrast vs venous congestion. Possible subfalcine herniation.   2D Echo pending  LDL 82  HgbA1c 8.5  VTE prophylaxis - SCDs   No antithrombotic prior to admission, now on No antithrombotic given within 24h of tPA and hemorrhage   Poor neurologic prognosis with no chance of survival. Daughter agreeable to DNR. She will discuss with family of condition.  Severe Cerebral Edema with brain herniation  Pupil changes noted during the night, unable to get CT at that time d/t groin instability  Repeat CT head w/ neuro worsening large L MCA infarct w/ severe mass effect and hemorrhagic transformation, new L ACA infarct. 64mm R shift w/ trapping R ventricle and partially effaced basilar cisterns. Intraventricular contrast present. Possible B SAH vs constrast vs venous congestion. Possible subfalcine herniation.   Started on 3% protocol @ 50/h  Goal Na 150-155  NSY consulted. No role for surgical decompression. Discussed terminal nature of stroke.   Respiratory Failure  Intubated for IR, left intubated post IR  Just off sedation   CCM onboard  Acute Blood Loss Anemia d/t Groin Bleeding  R groin sheath left in place post tPA/IR d/t INR 2.7  Groin bleeding noted 0100 w/ pressure held   Elevated INR + low fibrinogen levels likely d/t tPA   FFP 2u  Dr. Estanislado Pandy pulled sheath [phone] d/t ongoing bleeding  HGB 11.5->9.5->8.4  INR 2.7->2.0->1.6  D-dimer > 20  Fibrinogen < 60  CT abd/pelvis no source bleeding  TXA and Cyro given  Atrial Fibrillation  Home anticoagulation:  none, previously on Eliquis but stopped 2 mos ago d/t GIB   Currently rate controlled  Not on antithrombotics now  Hypertension  Home meds:  amlodipine 10 mg, imdur 30 mg, Coreg 6.25 bid . BP goal < 140 given hemorrhage.  . On Cleviprex gtt . Long-term BP goal normotensive  Hyperlipidemia  Home meds:  pravachol 10  LDL 82, goal < 70  Continue statin at  discharge  Diabetes type II Uncontrolled w/ neuropathy  HgbA1c 8.5, goal < 7.0  SSI  CBG monitoring  DB RN following - recommends Levemir 8u   Dysphagia . Secondary to stroke . NPO . Speech on board   Other Stroke Risk Factors  Advanced Age >/= 34   Obesity, Body mass index is 38.95 kg/m., BMI >/= 30 associated with increased stroke risk, recommend weight loss, diet and exercise as appropriate   Coronary artery disease - 3VD seen on CTA  Chronic combined systolic and diastolic Congestive heart failure, on lasix + K PTA  Cardiomyopathy   Other Active Problems  ESRD on intermittent HD w/ RUE fistula. Nephrology consulted    CHB s/p St Jude pacer, not MRI compatible  L kidney lesion, MRI w/w/o recommended by rad but has non-compatible pacer  Aortic atherosclerosis  Mitral stenosis w/ mild MR  Hx breast cancer s/p chemo & mastectomy   Chronic pain on norco PTA, on hold  B12 deficiency on replacement  GERD on PPI   Low vitamin D on replacement  Hospital day # 1  This patient is critically ill due to left MCA large infarct, hemorrhagic conversion, diffuse SAH, cerebral edema, AFib, anemia, respiratory failure and at significant risk of neurological worsening, death form brain herniation, seizure, heart failure, cardiac arrest, hydrocephalus. This patient's care requires constant monitoring of vital signs, hemodynamics, respiratory and cardiac monitoring, review of multiple databases, neurological assessment, discussion with family, other specialists and medical decision making of high complexity. I spent 30 minutes of neurocritical care time in the care of this patient. I had long discussion with daughter at bedside, updated pt current condition, treatment plan and potential prognosis, and answered all the questions. She expressed understanding and appreciation.   Rosalin Hawking, MD PhD Stroke Neurology 08-04-2020 4:00 PM   To contact Stroke Continuity provider,  please refer to [email or web address] After hours, contact General Neurology

## 2020-08-25 NOTE — Progress Notes (Addendum)
Patient re-examined.   BP (!) 130/44   Pulse 60   Temp (!) 97.2 F (36.2 C)   Resp 18   Wt 99.7 kg   SpO2 100%   BMI 38.95 kg/m   Repeat neurological exam reveals sluggish and irregular pupil on the left at 4 mm >> 3 mm. Right pupil constricts 3 mm >> 2 mm, also sluggish but less so than on the left. No other changes since prior exam. Intubated and on propofol.   Continued bleeding from sheath site.   First bag of FFP completed. Second bag is running.   Dr. Estanislado Pandy is on his way to assess sheath.   Additional 10 minutes of critical care time.   Electronically signed: Dr. Kerney Elbe

## 2020-08-25 NOTE — Anesthesia Postprocedure Evaluation (Signed)
Anesthesia Post Note  Patient: Cassandra Parker  Procedure(s) Performed: IR WITH ANESTHESIA (N/A )     Patient location during evaluation: SICU Anesthesia Type: General Level of consciousness: sedated Pain management: pain level controlled Vital Signs Assessment: post-procedure vital signs reviewed and stable Respiratory status: patient remains intubated per anesthesia plan Cardiovascular status: stable Postop Assessment: no apparent nausea or vomiting Anesthetic complications: no   No complications documented.  Last Vitals:  Vitals:   08-07-2020 0700 07-Aug-2020 0704  BP: (!) 118/46   Pulse: 60 60  Resp: 18 18  Temp:    SpO2: 99% 98%    Last Pain:  Vitals:   2020/08/07 0448  TempSrc: Axillary                 Catalina Gravel

## 2020-08-25 NOTE — Progress Notes (Signed)
Patient transported to CT and back without complications. RN at bedside.  

## 2020-08-25 NOTE — Progress Notes (Signed)
Patient ID: Cassandra Parker, female   DOB: November 04, 1940, 79 y.o.   MRN: [id number] INR.  Informed of patient bleeding in the RT groin, with sheath partly protruding.  At patients bed side at approx 5. 45 am  45F sheath removed. Found to be distorted and kinked. Manual compression held for appro 50 mins with a thrombin pad. Hemostasis achieved. No palpable hematoma. Pressure bandage applied. Distal DP on the RT palpable. Patient hemodynamically stable. Advised to continue with  previous post sheath removal orders. S.Rosaura Bolon MD

## 2020-08-25 NOTE — Progress Notes (Signed)
Referring Physician(s): Code stroke- Bhagat, Srishti L (neurology)  Supervising Physician: Luanne Bras  Patient Status:  [facility] - In-pt  Chief Complaint: None- intubated with sedation  Subjective:  History of acute CVA s/p cerebral arteriogram with emergent mechanical thrombectomy of left MCA and left ICA occlusions achieving a TICI 2c revascularization via right femoral approach 08/03/2020 by Dr. Estanislado Pandy. Patient laying in bed intubated with sedation. Daughter at bedside, tearful. No spontaneous movements of extremities. Right femoral puncture site c/d/i.  CT head this AM: 1. Severe intracranial mass effect and severe infarction of the left hemisphere. 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 New left ACA infarct which may be subfalcine herniation related. 2. Rightward midline shift of 18 mm with trapping of the right lateral ventricle and partially effaced basilar cisterns. 3. Suspicion of superimposed bilateral subarachnoid hemorrhage, although residual intravascular contrast is present and pseudo-SAH from venous congestion is also possible.   Allergies: Patient has no known allergies.  Medications: Prior to Admission medications   Medication Sig Start Date End Date Taking? Authorizing Provider  acetaminophen (TYLENOL) 325 MG tablet Take 2 tablets (650 mg total) by mouth every 6 (six) hours as needed for mild pain or headache (or Fever >/= 101). 12/23/19  Yes Emokpae, Courage, MD  albuterol (PROVENTIL) (2.5 MG/3ML) 0.083% nebulizer solution Take 3 mLs (2.5 mg total) by nebulization every 2 (two) hours as needed for wheezing or shortness of breath. 05/28/20  Yes Roxan Hockey, MD  amLODipine (NORVASC) 10 MG tablet  07/14/20  Yes [provider]  carvedilol (COREG) 6.25 MG tablet Take 1 tablet (6.25 mg total) by mouth 2 (two) times daily. 05/28/20  Yes Emokpae, Courage, MD  Cholecalciferol (VITAMIN D) 2000 UNITS tablet Take 2,000  Units by mouth daily.    Yes [provider]  furosemide (LASIX) 40 MG tablet Take 3 tablets (120 mg total) by mouth 2 (two) times daily. 06/09/20 08/08/2020 Yes Dahal, Marlowe Aschoff, MD  glipiZIDE (GLUCOTROL XL) 5 MG 24 hr tablet Take 5 mg by mouth See admin instructions. Take 10 mg in the morning and 5 mg at night   Yes [provider]  HYDROcodone-acetaminophen (NORCO/VICODIN) 5-325 MG tablet Take 1 tablet by mouth every 6 (six) hours as needed for moderate pain. 06/28/20  Yes Rhyne, Hulen Shouts, PA-C  isosorbide mononitrate (IMDUR) 30 MG 24 hr tablet Take 1 tablet (30 mg total) by mouth daily. 05/28/20 05/28/21 Yes Emokpae, Courage, MD  pantoprazole (PROTONIX) 40 MG tablet Take 1 tablet (40 mg total) by mouth daily. 05/28/20  Yes Emokpae, Courage, MD  potassium chloride (KLOR-CON) 20 MEQ tablet Take 0.5 tablets (10 mEq total) by mouth daily. 06/09/20 08/24/2020 Yes Dahal, Marlowe Aschoff, MD  pravastatin (PRAVACHOL) 10 MG tablet Take 10 mg by mouth at bedtime.    Yes [provider]  vitamin B-12 (CYANOCOBALAMIN) 1000 MCG tablet Take 1,000 mcg by mouth daily.   Yes [provider]  ACCU-CHEK GUIDE test strip  03/30/20   [provider]  Accu-Chek Softclix Lancets lancets SMARTSIG:Topical 03/30/20   [provider]  Alcohol Swabs (B-D SINGLE USE SWABS REGULAR) PADS Apply topically. Patient not taking: Reported on 08/05/2020 03/30/20   [provider]     Vital Signs: BP (!) 121/55   Pulse 60   Temp (!) 97.3 F (36.3 C)   Resp 18   Wt 219 lb 14.4 oz (99.7 kg)   SpO2 99%   BMI 38.95 kg/m   Physical Exam Vitals and nursing  note reviewed.  Constitutional:      General: She is not in acute distress.    Comments: Intubated with sedation.  Pulmonary:     Effort: Pulmonary effort is normal. No respiratory distress.     Comments: Intubated with sedation. Skin:    General: Skin is warm and dry.     Comments: Right femoral puncture site soft without active  bleeding or hematoma; pressure dressing with syringe over site.  Neurological:     Comments: Intubated with sedation. Does not respond to voice and does not follow simple commands. Pupils fixed bilaterally. No spontaneous movements of extremities. Distal pulses (DPs) palpable bilaterally with Doppler.     Imaging: CT ABDOMEN PELVIS WO CONTRAST  Result Date: July 31, 2020 CLINICAL DATA:  79 year old female with history of anemia. Evaluate for potential retroperitoneal bleed. Patient administered tPA yesterday evening. EXAM: CT ABDOMEN AND PELVIS WITHOUT CONTRAST TECHNIQUE: Multidetector CT imaging of the abdomen and pelvis was performed following the standard protocol without IV contrast. COMPARISON:  No priors. FINDINGS: Lower chest: Dependent areas of subsegmental atelectasis in the left lower lobe. Cardiomegaly. Atherosclerotic calcifications in the descending thoracic aorta as well as the left anterior descending, left circumflex and right coronary arteries. Pacemaker leads in the right atrium and right ventricle. Hepatobiliary: No discrete cystic or solid hepatic lesions confidently identified on today's noncontrast CT examination. Status post cholecystectomy. Pancreas: No definite pancreatic mass or peripancreatic fluid collections or inflammatory changes are identified on today's noncontrast CT examination. Spleen: Unremarkable. Adrenals/Urinary Tract: Contrast material in the collecting systems of both kidneys, ureters and in the urinary bladder, presumably from recent interventional procedure. In the medial aspect of the upper pole of the left kidney there is a 1.7 x 1.3 cm high attenuation lesion (95 HU), incompletely characterized. Right kidney and bilateral adrenal glands are otherwise unremarkable in appearance. No hydroureteronephrosis. Urinary bladder is unremarkable in appearance. Stomach/Bowel: Nasogastric tube extending into the distal body of the stomach. Stomach is otherwise unremarkable  in appearance. No pathologic dilatation of small bowel or colon. Numerous colonic diverticulae are noted, particularly in the sigmoid colon, without surrounding inflammatory changes to suggest an acute diverticulitis at this time. The appendix is not confidently identified and may be surgically absent. Regardless, there are no inflammatory changes noted adjacent to the cecum to suggest the presence of an acute appendicitis at this time. Vascular/Lymphatic: Aortic atherosclerosis. No lymphadenopathy noted in the abdomen or pelvis. Reproductive: Uterus and ovaries are unremarkable in appearance. Other: No high attenuation fluid collection in the peritoneal cavity or retroperitoneum to suggest significant hemorrhage. No significant volume of ascites. No pneumoperitoneum. Musculoskeletal: There are no aggressive appearing lytic or blastic lesions noted in the visualized portions of the skeleton. IMPRESSION: 1. No occult hemorrhage identified in the abdomen or pelvis to account for the patient's decreasing hematocrit. 2. No acute findings noted in the abdomen or pelvis. 3. 1.7 x 1.3 cm high attenuation lesion in the medial aspect of the upper pole of the left kidney, incompletely characterized on today's examination. Further evaluation with nonemergent abdominal MRI with and without IV gadolinium is recommended after resolution of the patient's acute illness to exclude the possibility of neoplasm. 4. Colonic diverticulosis without evidence of acute diverticulitis at this time. 5. Aortic atherosclerosis, in addition to at least 3 vessel coronary artery disease. Assessment for potential risk factor modification, dietary therapy or pharmacologic therapy may be warranted, if clinically indicated. 6. Additional incidental findings, as above. Electronically Signed   By: Mauri Brooklyn.D.  On: 08/27/20 09:43   CT Angio Head W or Wo Contrast  Result Date: 08/18/2020 CLINICAL DATA:  79 year old female code stroke  presentation with loss of speech, right side weakness. Hyperdense left ICA terminus and left M1 on plain head CT. EXAM: CT ANGIOGRAPHY HEAD AND NECK TECHNIQUE: Multidetector CT imaging of the head and neck was performed using the standard protocol during bolus administration of intravenous contrast. Multiplanar CT image reconstructions and MIPs were obtained to evaluate the vascular anatomy. Carotid stenosis measurements (when applicable) are obtained utilizing NASCET criteria, using the distal internal carotid diameter as the denominator. CONTRAST:  32mL OMNIPAQUE IOHEXOL 350 MG/ML SOLN COMPARISON:  Plain head CT at 1426 hours today. FINDINGS: CTA NECK Skeleton: Mostly absent dentition. Advanced lower cervical spine degeneration. No acute osseous abnormality identified. Upper chest: Left chest cardiac pacemaker type device. Otherwise negative. Other neck: Negative; partially retropharyngeal course of both carotids. Aortic arch: 3 vessel arch configuration. Minimal arch atherosclerosis. Right carotid system: Minimal brachiocephalic artery plaque without stenosis. Mild motion artifact at the thoracic inlet. Mildly tortuous proximal right CCA. No definite stenosis. Retropharyngeal course of the right CCA at the level of the larynx and pharynx. Minimal calcified plaque at the bifurcation affecting the ECA. No right ICA plaque or stenosis. Tortuous right ICA at the C2-C3 level. Left carotid system: Negative left CCA origin. Tortuous proximal left CCA and retropharyngeal course similar to that on the right. At the left carotid bifurcation there is mild soft and calcified plaque. The left ICA origin and bulb are patent, but the vessel appears gradually occluded distal to the bulb. There is no enhancement at the skull base. See intracranial findings below. Vertebral arteries: Negative proximal right subclavian artery. Mild calcified plaque at the right vertebral artery origin without stenosis. Patent right vertebral artery  to the skull base without additional plaque or stenosis. Mild plaque in the proximal left subclavian artery without stenosis. Similar minimal calcified plaque at the left vertebral artery origin without stenosis. Dominant left vertebral artery with a tortuous V1 segment. Patent left vertebral to the skull base without stenosis. CTA HEAD Posterior circulation: Patent distal vertebral arteries, the left is mildly dominant. Patent PICA origins. No distal vertebral plaque or stenosis. Patent vertebrobasilar junction and basilar artery without stenosis. Patent SCA and PCA origins. Posterior communicating arteries are diminutive or absent. Right PCA branches are within normal limits. The left PCA is mildly irregular but otherwise within normal limits. Anterior circulation: Occluded left ICA siphon with superimposed mild to moderate calcified plaque. Left ICA terminus is occluded. Left MCA and ACA origins are occluded. There is no significant reconstitution of the left MCA. The left ACA is reconstituted in the A1 segment. Anterior communicating artery appears normal. Bilateral ACA branches are within normal limits. Preliminary report of the above discussed by telephone with Dr. Elnora Morrison on 08/11/2020 at 15:05 . Patent right ICA siphon with mild to moderate calcified plaque. Only mild supraclinoid stenosis. Patent right ICA terminus. Patent right MCA and ACA origins. The right A1 is within normal limits. Right MCA M1 segment and trifurcation are patent without stenosis. Right MCA branches are within normal limits. Venous sinuses: Patent. Anatomic variants: Dominant left vertebral artery. Review of the MIP images confirms the above findings IMPRESSION: 1. Positive for Emergent Large Vessel Occlusion: Left ICA T-occlusion. LICA occluded in the neck and at the terminus along with the Left MCA and ACA origins. The left ACA is reconstituted but the MCA is not. This was discussed by telephone with  Dr. Elnora Morrison on  08/19/2020 at 15:05 . 2. Underlying mild for age atherosclerosis. Calcified plaque most notable in both ICA siphons. 3. Left chest pacemaker. Electronically Signed   By: Genevie Ann M.D.   On: 08/17/2020 15:13   CT HEAD WO CONTRAST  Result Date: 02-Aug-2020 CLINICAL DATA:  79 year old female status post E LV 0, left ICA T occlusion yesterday. Transferred from [facility] status post NIR. EXAM: CT HEAD WITHOUT CONTRAST TECHNIQUE: Contiguous axial images were obtained from the base of the skull through the vertex without intravenous contrast. COMPARISON:  Code stroke presentation head CT 1426 hours yesterday. FINDINGS: Brain: Severe new intracranial mass effect. Leftward midline shift of 18 mm with sub fall seen herniation and partially effaced basilar cisterns. Large left MCA territory infarct although with abundant hyperdense parenchyma. Some of this is related to malignant hemorrhagic transformation. A component of parenchymal contrast staining is also possible. Superimposed left ACA territory infarct has developed probably from the subfalcine herniation (series 3, image 19). Effaced left and trapped right lateral ventricles. No intraventricular hemorrhage identified. However, there is evidence of bilateral subarachnoid hemorrhage. Less likely the appearance might be a pseudo SAH (related to diffuse venous congestion. No right hemisphere or cerebellar infarct is evident, although there is suspicion of edema in the bilateral thalami and brainstem. Vascular: Calcified atherosclerosis at the skull base. There seems to be residual intravascular contrast present in the major arterial and venous structures. Skull: No acute osseous abnormality identified. Sinuses/Orbits: Visualized paranasal sinuses and mastoids are stable and well pneumatized. Other: No acute orbit or scalp soft tissue findings. IMPRESSION: 1. Severe intracranial mass effect and severe infarction of the left hemisphere. Large left MCA territory  infarct with malignant hemorrhagic transformation. New left ACA infarct which may be subfalcine herniation related. 2. Rightward midline shift of 18 mm with trapping of the right lateral ventricle and partially effaced basilar cisterns. 3. Suspicion of superimposed bilateral subarachnoid hemorrhage, although residual intravascular contrast is present and pseudo-SAH from venous congestion is also possible. Critical Value/emergent results were called by telephone at the time of interpretation on 08/02/2020 at 0824 hours to Dr. Erlinda Hong who verbally acknowledged these results. Electronically Signed   By: Genevie Ann M.D.   On: 2020-08-02 08:51   CT Angio Neck W and/or Wo Contrast  Result Date: 08/05/2020 CLINICAL DATA:  79 year old female code stroke presentation with loss of speech, right side weakness. Hyperdense left ICA terminus and left M1 on plain head CT. EXAM: CT ANGIOGRAPHY HEAD AND NECK TECHNIQUE: Multidetector CT imaging of the head and neck was performed using the standard protocol during bolus administration of intravenous contrast. Multiplanar CT image reconstructions and MIPs were obtained to evaluate the vascular anatomy. Carotid stenosis measurements (when applicable) are obtained utilizing NASCET criteria, using the distal internal carotid diameter as the denominator. CONTRAST:  53mL OMNIPAQUE IOHEXOL 350 MG/ML SOLN COMPARISON:  Plain head CT at 1426 hours today. FINDINGS: CTA NECK Skeleton: Mostly absent dentition. Advanced lower cervical spine degeneration. No acute osseous abnormality identified. Upper chest: Left chest cardiac pacemaker type device. Otherwise negative. Other neck: Negative; partially retropharyngeal course of both carotids. Aortic arch: 3 vessel arch configuration. Minimal arch atherosclerosis. Right carotid system: Minimal brachiocephalic artery plaque without stenosis. Mild motion artifact at the thoracic inlet. Mildly tortuous proximal right CCA. No definite stenosis. Retropharyngeal  course of the right CCA at the level of the larynx and pharynx. Minimal calcified plaque at the bifurcation affecting the ECA. No right ICA plaque or  stenosis. Tortuous right ICA at the C2-C3 level. Left carotid system: Negative left CCA origin. Tortuous proximal left CCA and retropharyngeal course similar to that on the right. At the left carotid bifurcation there is mild soft and calcified plaque. The left ICA origin and bulb are patent, but the vessel appears gradually occluded distal to the bulb. There is no enhancement at the skull base. See intracranial findings below. Vertebral arteries: Negative proximal right subclavian artery. Mild calcified plaque at the right vertebral artery origin without stenosis. Patent right vertebral artery to the skull base without additional plaque or stenosis. Mild plaque in the proximal left subclavian artery without stenosis. Similar minimal calcified plaque at the left vertebral artery origin without stenosis. Dominant left vertebral artery with a tortuous V1 segment. Patent left vertebral to the skull base without stenosis. CTA HEAD Posterior circulation: Patent distal vertebral arteries, the left is mildly dominant. Patent PICA origins. No distal vertebral plaque or stenosis. Patent vertebrobasilar junction and basilar artery without stenosis. Patent SCA and PCA origins. Posterior communicating arteries are diminutive or absent. Right PCA branches are within normal limits. The left PCA is mildly irregular but otherwise within normal limits. Anterior circulation: Occluded left ICA siphon with superimposed mild to moderate calcified plaque. Left ICA terminus is occluded. Left MCA and ACA origins are occluded. There is no significant reconstitution of the left MCA. The left ACA is reconstituted in the A1 segment. Anterior communicating artery appears normal. Bilateral ACA branches are within normal limits. Preliminary report of the above discussed by telephone with Dr. Elnora Morrison on 08/20/2020 at 15:05 . Patent right ICA siphon with mild to moderate calcified plaque. Only mild supraclinoid stenosis. Patent right ICA terminus. Patent right MCA and ACA origins. The right A1 is within normal limits. Right MCA M1 segment and trifurcation are patent without stenosis. Right MCA branches are within normal limits. Venous sinuses: Patent. Anatomic variants: Dominant left vertebral artery. Review of the MIP images confirms the above findings IMPRESSION: 1. Positive for Emergent Large Vessel Occlusion: Left ICA T-occlusion. LICA occluded in the neck and at the terminus along with the Left MCA and ACA origins. The left ACA is reconstituted but the MCA is not. This was discussed by telephone with Dr. Elnora Morrison on 08/03/2020 at 15:05 . 2. Underlying mild for age atherosclerosis. Calcified plaque most notable in both ICA siphons. 3. Left chest pacemaker. Electronically Signed   By: Genevie Ann M.D.   On: 08/13/2020 15:13   DG CHEST PORT 1 VIEW  Result Date: 08/17/2020 CLINICAL DATA:  Respiratory failure, hypoxic EXAM: PORTABLE CHEST 1 VIEW COMPARISON:  Is 06/05/2020 FINDINGS: None endotracheal tube is been placed with its tip 18 mm above the carina. Lung volumes are small, but are symmetric and are clear. Previously noted perihilar pulmonary infiltrate has resolved. No pneumothorax or pleural effusion. Cardiac size is mildly enlarged, unchanged. Left subclavian pacemaker is unchanged. Pulmonary vascularity is normal. No acute bone abnormality. IMPRESSION: Endotracheal tube 18 mm above the carina. Pulmonary hypoinflation. Resolved pulmonary infiltrate. Electronically Signed   By: Fidela Salisbury MD   On: 08/15/2020 19:19   CT HEAD CODE STROKE WO CONTRAST  Result Date: 07/27/2020 CLINICAL DATA:  Code stroke. Neuro deficit, acute stroke suspected. Altered mental status. Unable to speak or move right side. EXAM: CT HEAD WITHOUT CONTRAST TECHNIQUE: Contiguous axial images were obtained from the base  of the skull through the vertex without intravenous contrast. COMPARISON:  CT head 06/05/2020. FINDINGS: Brain: Evaluation is limited by  motion; however, possible mild hypodensity of the left insula. No acute hemorrhage. Similar appearance of a remote high left frontal cortical infarct. Additional patchy white matter hypoattenuation, compatible with chronic microvascular ischemic disease. Vascular: Hyperdense left ICA terminus and proximal M1 MCA, concerning for thrombus. Calcific atherosclerosis. Skull: No acute fracture. Sinuses/Orbits: No acute findings. Other: No mastoid effusion. ASPECTS [facility] Stroke Program Early CT Score) - Ganglionic level infarction (caudate, lentiform nuclei, internal capsule, insula, M1-M3 cortex): 6 - Supraganglionic infarction (M4-M6 cortex): 3 Total score (0-10 with 10 being normal): 9 IMPRESSION: 1. Hyperdense left ICA terminus and proximal left M1 MCA, concerning for thrombus. See forthcoming ordered CTA for further evaluation. 2. Evaluation is limited by patient motion; however, possible mild hypodensity of the left insula, as can be seen with acute MCA territory infarct. ASPECTS is 9. 3. No acute hemorrhage. Code stroke imaging results were communicated on 08/20/2020 at 2:41 pm to provider Dr. Reather Converse Via telephone, who verbally acknowledged these results. Electronically Signed   By: Margaretha Sheffield MD   On: 08/22/2020 14:48    Labs:  CBC: Recent Labs    06/09/20 0600 06/28/20 0823 08/15/2020 1633 08/24/2020 1641 08-19-2020 0215 08/19/20 0553  WBC 7.2  --   --  10.4 8.1 8.5  HGB 8.3*   < > 12.6 11.5* 9.5* 8.4*  HCT 27.7*   < > 37.0 37.8 30.4* 27.1*  PLT 375  --   --  210 189 172   < > = values in this interval not displayed.    COAGS: Recent Labs    05/22/20 1115 08/15/2020 1655 August 19, 2020 0215 Aug 19, 2020 0553  INR 1.9* 2.7* 2.0* 1.6*  APTT  --  146* 39* 36    BMP: Recent Labs    06/06/20 0900 06/06/20 0900 06/07/20 1019 06/07/20 1019 06/08/20 0558  06/08/20 0558 06/09/20 0600 06/28/20 0823 08/11/2020 1641 07/31/2020 2305 Aug 19, 2020 0215 08/19/20 0553  NA 142   < > 142  143   < > 142  143   < > 141   < > 140 141 140 142  141  K 4.2   < > 3.8  3.8   < > 3.6  3.6   < > 3.4*   < > 5.4* 4.0 4.0 4.0  CL 110   < > 105  105   < > 104  105   < > 101   < > 107 110 108 108  CO2 22   < > 24  24   < > 27  27   < > 30  --  17* 18* 18* 20*  GLUCOSE 147*   < > 182*  184*   < > 165*  166*   < > 164*   < > 336* 307* 246* 234*  BUN 85*   < > 82*  81*   < > 84*  83*   < > 80*   < > 94* 87* 85* 89*  CALCIUM 8.8*   < > 9.0  9.1   < > 8.8*  8.8*   < > 8.9  --  8.6* 8.1* 8.4* 8.3*  CREATININE 3.68*   < > 3.69*  3.68*   < > 3.72*  3.62*   < > 3.36*   < > 4.32* 4.18* 4.47* 4.31*  GFRNONAA 11*   < > 11*  11*   < > 11*  12*   < > 13*  --  10* 10* 10* 10*  GFRAA 13*  --  13*  13*  --  13*  13*  --  15*  --   --   --   --   --    < > = values in this interval not displayed.    LIVER FUNCTION TESTS: Recent Labs    12/21/19 1606 12/21/19 1606 05/22/20 1115 05/22/20 1115 06/05/20 0852 06/05/20 0852 06/06/20 0900 06/07/20 1019 06/08/20 0558 08/06/2020 1641  BILITOT 0.3  --  0.6  --  0.5  --   --   --   --  0.8  AST 17  --  46*  --  61*  --   --   --   --  27  ALT 17  --  34  --  113*  --   --   --   --  24  ALKPHOS 63  --  67  --  79  --   --   --   --  64  PROT 6.7  --  7.0  --  7.1  --   --   --   --  6.7  ALBUMIN 3.2*   < > 3.3*   < > 3.3*   < > 3.1* 3.1* 2.9* 3.4*   < > = values in this interval not displayed.    Assessment and Plan:  History of acute CVA s/p cerebral arteriogram with emergent mechanical thrombectomy of left MCA and left ICA occlusions achieving a TICI 2c revascularization via right femoral approach 08/10/2020 by Dr. Estanislado Pandy. Patient's condition guarded- remains intubated/sedated, pupils fixed bilaterally, no spontaneous movements of extremities. For possible transition to comfort care. Right femoral puncture  site stable, distal pulses (DPs) palpable bilaterally with Doppler. Please remove syringe from right femoral puncture site in 24 hours. Further plans per neurology/CCM- appreciate and agree with management. Please call NIR with questions/concerns.   Electronically Signed: Earley Abide, PA-C 08-15-2020, 9:57 AM   I spent a total of 15 Minutes at the the patient's bedside AND on the patient's hospital floor or unit, greater than 50% of which was counseling/coordinating care for CVA s/p revascularization.

## 2020-08-25 NOTE — Progress Notes (Signed)
Dr. Estanislado Pandy notified that the sheath is sliding out as pressure is being held per Dr. Estanislado Pandy "push it back in" blood then began squirting out from around the open area around the sheath while pressure is being held. Per Dr. Estanislado Pandy "just pull out the sheath and hold pressure". I notified Dr. Estanislado Pandy that is not within our scope of practice and he needs to come do that, Dr. Estanislado Pandy said he will call Dr. Cheral Marker.

## 2020-08-25 NOTE — Progress Notes (Signed)
CODE STROKE CT times 1409 call time 1421 beeper time 1426 exam started 1427 exam finished 1435 exam completed in EPIC 1435 Intercourse radiology called

## 2020-08-25 NOTE — Progress Notes (Signed)
Echocardiogram 2D Echocardiogram has been performed.  Oneal Deputy Salinda Snedeker 08/13/2020, 10:33 AM

## 2020-08-25 NOTE — Progress Notes (Signed)
0100 blood noticed on the sheath dressing, pressure held, Dr. Estanislado Pandy paged.  0115, bandage removed and sheath in contact with skin as requested by Dr. Estanislado Pandy, oozing around sheath continued to held pressure.   0200 still holding pressure, Dr. Estanislado Pandy paged per request sheath line pulled back to check for blood return, no blood return present, still bleeding. Per Dr. Estanislado Pandy get a PT INR now and continue to hold pressure for another hour then at 0300 if still bleeding contact neurology and see if they want to reverse tPA.   Pulses present, no hematoma

## 2020-08-25 NOTE — Progress Notes (Addendum)
PT Cancellation Note  Patient Details Name: Cassandra Parker MRN: [id number] DOB: 1940-10-15   Cancelled Treatment:    Reason Eval/Treat Not Completed: Medical issues which prohibited therapy.  Will sign off at this time, based on conversation with the MD.  Cassandra Carne., PT [facility] [phone]  (pager) [phone]  (office)   Cassandra Parker 08/20/20, 10:24 AM

## 2020-08-25 NOTE — Progress Notes (Signed)
Pupil change, pupils unequal and very very sluggish. Pupils were not this sluggish  at last neuro check. MD Lindzen notified. Discussed possible stat CT, patient is still bleeding at the vascular site, pressure has been applied for almost 4 hours. Discussing the possibility of transport patient, or having CT with pressure applied. MD Lindzen advised to run FFP at 30 minutes per bag with verbal read back. Will assess labs post transfusion and discuss CT. RN monitoring

## 2020-08-25 NOTE — Death Summary Note (Signed)
Stroke Death Summary  Patient ID: Cassandra Parker   MRN: [id number]      DOB: 08-Jun-1941  Date of Admission: 08/17/2020 Date of Discharge: 08/09/20  Attending Physician:  Dr. Rosalin Hawking, Stroke MD Consultant(s):    Olene Craven) Estanislado Pandy, MD (Interventional Neuroradiologist), Audria Nine, DO (pulmonary/intensive care)  Patient's PCP:  Manon Hilding, MD  DISCHARGE DIAGNOSIS:  Principal Problem:   Acute cerebrovascular accident (CVA) due to occlusion of left carotid artery (HCC) s/p tPA and mechanical thrombectomy Active Problems:   CHB (complete heart block) (HCC)   HTN (hypertension)   Diabetes mellitus type 2, controlled, without complications ([place])   Dyslipidemia   Pacemaker   Acute metabolic encephalopathy   Middle cerebral artery embolism, left   Atrial fibrillation (HCC)   Coagulopathy (HCC)   IVH (intraventricular hemorrhage) (HCC)   SAH (subarachnoid hemorrhage) (HCC)   Cerebral edema ([place])   Brain herniation ([place])   Respiratory failure, acute ([place])   Acute blood loss anemia   Puncture wound of groin  LABORATORY STUDIES CBC    Component Value Date/Time   WBC 9.7 2020/08/09 1100   RBC 2.84 (L) Aug 09, 2020 1100   HGB 8.3 (L) 2020-08-09 1100   HCT 26.7 (L) August 09, 2020 1100   PLT 174 08/09/2020 1100   MCV 94.0 Aug 09, 2020 1100   MCH 29.2 2020/08/09 1100   MCHC 31.1 August 09, 2020 1100   RDW 15.7 (H) 09-Aug-2020 1100   LYMPHSABS 1.3 09-Aug-2020 1100   MONOABS 0.9 08/09/20 1100   EOSABS 0.0 2020-08-09 1100   BASOSABS 0.0 08/09/2020 1100   CMP    Component Value Date/Time   NA 147 (H) 2020-08-09 1312   K 4.0 2020/08/09 0553   CL 108 08/09/20 0553   CO2 20 (L) 2020/08/09 0553   GLUCOSE 234 (H) 09-Aug-2020 0553   BUN 89 (H) August 09, 2020 0553   CREATININE 4.31 (H) 2020/08/09 0553   CALCIUM 8.3 (L) 08/09/20 0553   PROT 6.7 08/16/2020 1641   ALBUMIN 3.4 (L) 07/31/2020 1641   AST 27 08/15/2020 1641   ALT 24 08/01/2020 1641   ALKPHOS 64 07/31/2020  1641   BILITOT 0.8 08/02/2020 1641   GFRNONAA 10 (L) 08/09/2020 0553   GFRAA 15 (L) 06/09/2020 0600   COAGS Lab Results  Component Value Date   INR 1.6 (H) 09-Aug-2020   INR 2.0 (H) Aug 09, 2020   INR 2.7 (H) 07/27/2020   Lipid Panel    Component Value Date/Time   CHOL 178 2020-08-09 0215   TRIG 222 (H) 08-09-20 0215   TRIG 218 (H) 2020/08/09 0215   HDL 52 2020-08-09 0215   CHOLHDL 3.4 08/09/20 0215   VLDL 44 (H) 08/09/20 0215   LDLCALC 82 Aug 09, 2020 0215   HgbA1C  Lab Results  Component Value Date   HGBA1C 8.5 (H) 08-09-20   Urinalysis    Component Value Date/Time   COLORURINE YELLOW 06/05/2020 0743   APPEARANCEUR HAZY (A) 06/05/2020 0743   LABSPEC 1.013 06/05/2020 0743   PHURINE 5.0 06/05/2020 0743   GLUCOSEU NEGATIVE 06/05/2020 0743   HGBUR SMALL (A) 06/05/2020 0743   BILIRUBINUR NEGATIVE 06/05/2020 0743   KETONESUR NEGATIVE 06/05/2020 0743   PROTEINUR 100 (A) 06/05/2020 0743   UROBILINOGEN 0.2 10/13/2013 2318   NITRITE NEGATIVE 06/05/2020 0743   LEUKOCYTESUR NEGATIVE 06/05/2020 0743    SIGNIFICANT DIAGNOSTIC STUDIES CT ABDOMEN PELVIS WO CONTRAST  Result Date: August 09, 2020 CLINICAL DATA:  79 year old female with history of anemia. Evaluate for potential retroperitoneal bleed. Patient administered  tPA yesterday evening. EXAM: CT ABDOMEN AND PELVIS WITHOUT CONTRAST TECHNIQUE: Multidetector CT imaging of the abdomen and pelvis was performed following the standard protocol without IV contrast. COMPARISON:  No priors. FINDINGS: Lower chest: Dependent areas of subsegmental atelectasis in the left lower lobe. Cardiomegaly. Atherosclerotic calcifications in the descending thoracic aorta as well as the left anterior descending, left circumflex and right coronary arteries. Pacemaker leads in the right atrium and right ventricle. Hepatobiliary: No discrete cystic or solid hepatic lesions confidently identified on today's noncontrast CT examination. Status post  cholecystectomy. Pancreas: No definite pancreatic mass or peripancreatic fluid collections or inflammatory changes are identified on today's noncontrast CT examination. Spleen: Unremarkable. Adrenals/Urinary Tract: Contrast material in the collecting systems of both kidneys, ureters and in the urinary bladder, presumably from recent interventional procedure. In the medial aspect of the upper pole of the left kidney there is a 1.7 x 1.3 cm high attenuation lesion (95 HU), incompletely characterized. Right kidney and bilateral adrenal glands are otherwise unremarkable in appearance. No hydroureteronephrosis. Urinary bladder is unremarkable in appearance. Stomach/Bowel: Nasogastric tube extending into the distal body of the stomach. Stomach is otherwise unremarkable in appearance. No pathologic dilatation of small bowel or colon. Numerous colonic diverticulae are noted, particularly in the sigmoid colon, without surrounding inflammatory changes to suggest an acute diverticulitis at this time. The appendix is not confidently identified and may be surgically absent. Regardless, there are no inflammatory changes noted adjacent to the cecum to suggest the presence of an acute appendicitis at this time. Vascular/Lymphatic: Aortic atherosclerosis. No lymphadenopathy noted in the abdomen or pelvis. Reproductive: Uterus and ovaries are unremarkable in appearance. Other: No high attenuation fluid collection in the peritoneal cavity or retroperitoneum to suggest significant hemorrhage. No significant volume of ascites. No pneumoperitoneum. Musculoskeletal: There are no aggressive appearing lytic or blastic lesions noted in the visualized portions of the skeleton. IMPRESSION: 1. No occult hemorrhage identified in the abdomen or pelvis to account for the patient's decreasing hematocrit. 2. No acute findings noted in the abdomen or pelvis. 3. 1.7 x 1.3 cm high attenuation lesion in the medial aspect of the upper pole of the left  kidney, incompletely characterized on today's examination. Further evaluation with nonemergent abdominal MRI with and without IV gadolinium is recommended after resolution of the patient's acute illness to exclude the possibility of neoplasm. 4. Colonic diverticulosis without evidence of acute diverticulitis at this time. 5. Aortic atherosclerosis, in addition to at least 3 vessel coronary artery disease. Assessment for potential risk factor modification, dietary therapy or pharmacologic therapy may be warranted, if clinically indicated. 6. Additional incidental findings, as above. Electronically Signed   By: Vinnie Langton M.D.   On: 10-Aug-2020 09:43   CT Angio Head W or Wo Contrast  Result Date: 08/13/2020 CLINICAL DATA:  79 year old female code stroke presentation with loss of speech, right side weakness. Hyperdense left ICA terminus and left M1 on plain head CT. EXAM: CT ANGIOGRAPHY HEAD AND NECK TECHNIQUE: Multidetector CT imaging of the head and neck was performed using the standard protocol during bolus administration of intravenous contrast. Multiplanar CT image reconstructions and MIPs were obtained to evaluate the vascular anatomy. Carotid stenosis measurements (when applicable) are obtained utilizing NASCET criteria, using the distal internal carotid diameter as the denominator. CONTRAST:  64mL OMNIPAQUE IOHEXOL 350 MG/ML SOLN COMPARISON:  Plain head CT at 1426 hours today. FINDINGS: CTA NECK Skeleton: Mostly absent dentition. Advanced lower cervical spine degeneration. No acute osseous abnormality identified. Upper chest: Left chest cardiac  pacemaker type device. Otherwise negative. Other neck: Negative; partially retropharyngeal course of both carotids. Aortic arch: 3 vessel arch configuration. Minimal arch atherosclerosis. Right carotid system: Minimal brachiocephalic artery plaque without stenosis. Mild motion artifact at the thoracic inlet. Mildly tortuous proximal right CCA. No definite  stenosis. Retropharyngeal course of the right CCA at the level of the larynx and pharynx. Minimal calcified plaque at the bifurcation affecting the ECA. No right ICA plaque or stenosis. Tortuous right ICA at the C2-C3 level. Left carotid system: Negative left CCA origin. Tortuous proximal left CCA and retropharyngeal course similar to that on the right. At the left carotid bifurcation there is mild soft and calcified plaque. The left ICA origin and bulb are patent, but the vessel appears gradually occluded distal to the bulb. There is no enhancement at the skull base. See intracranial findings below. Vertebral arteries: Negative proximal right subclavian artery. Mild calcified plaque at the right vertebral artery origin without stenosis. Patent right vertebral artery to the skull base without additional plaque or stenosis. Mild plaque in the proximal left subclavian artery without stenosis. Similar minimal calcified plaque at the left vertebral artery origin without stenosis. Dominant left vertebral artery with a tortuous V1 segment. Patent left vertebral to the skull base without stenosis. CTA HEAD Posterior circulation: Patent distal vertebral arteries, the left is mildly dominant. Patent PICA origins. No distal vertebral plaque or stenosis. Patent vertebrobasilar junction and basilar artery without stenosis. Patent SCA and PCA origins. Posterior communicating arteries are diminutive or absent. Right PCA branches are within normal limits. The left PCA is mildly irregular but otherwise within normal limits. Anterior circulation: Occluded left ICA siphon with superimposed mild to moderate calcified plaque. Left ICA terminus is occluded. Left MCA and ACA origins are occluded. There is no significant reconstitution of the left MCA. The left ACA is reconstituted in the A1 segment. Anterior communicating artery appears normal. Bilateral ACA branches are within normal limits. Preliminary report of the above discussed by  telephone with Dr. Elnora Morrison on 08/01/2020 at 15:05 . Patent right ICA siphon with mild to moderate calcified plaque. Only mild supraclinoid stenosis. Patent right ICA terminus. Patent right MCA and ACA origins. The right A1 is within normal limits. Right MCA M1 segment and trifurcation are patent without stenosis. Right MCA branches are within normal limits. Venous sinuses: Patent. Anatomic variants: Dominant left vertebral artery. Review of the MIP images confirms the above findings IMPRESSION: 1. Positive for Emergent Large Vessel Occlusion: Left ICA T-occlusion. LICA occluded in the neck and at the terminus along with the Left MCA and ACA origins. The left ACA is reconstituted but the MCA is not. This was discussed by telephone with Dr. Elnora Morrison on 08/08/2020 at 15:05 . 2. Underlying mild for age atherosclerosis. Calcified plaque most notable in both ICA siphons. 3. Left chest pacemaker. Electronically Signed   By: Genevie Ann M.D.   On: 08/21/2020 15:13   CT HEAD WO CONTRAST  Result Date: 08-06-2020 CLINICAL DATA:  79 year old female status post E LV 0, left ICA T occlusion yesterday. Transferred from [facility] status post NIR. EXAM: CT HEAD WITHOUT CONTRAST TECHNIQUE: Contiguous axial images were obtained from the base of the skull through the vertex without intravenous contrast. COMPARISON:  Code stroke presentation head CT 1426 hours yesterday. FINDINGS: Brain: Severe new intracranial mass effect. Leftward midline shift of 18 mm with sub fall seen herniation and partially effaced basilar cisterns. Large left MCA territory infarct although with abundant hyperdense parenchyma. Some  of this is related to malignant hemorrhagic transformation. A component of parenchymal contrast staining is also possible. Superimposed left ACA territory infarct has developed probably from the subfalcine herniation (series 3, image 19). Effaced left and trapped right lateral ventricles. No intraventricular  hemorrhage identified. However, there is evidence of bilateral subarachnoid hemorrhage. Less likely the appearance might be a pseudo SAH (related to diffuse venous congestion. No right hemisphere or cerebellar infarct is evident, although there is suspicion of edema in the bilateral thalami and brainstem. Vascular: Calcified atherosclerosis at the skull base. There seems to be residual intravascular contrast present in the major arterial and venous structures. Skull: No acute osseous abnormality identified. Sinuses/Orbits: Visualized paranasal sinuses and mastoids are stable and well pneumatized. Other: No acute orbit or scalp soft tissue findings. IMPRESSION: 1. Severe intracranial mass effect and severe infarction of the left hemisphere. 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 New left ACA infarct which may be subfalcine herniation related. 2. Rightward midline shift of 18 mm with trapping of the right lateral ventricle and partially effaced basilar cisterns. 3. Suspicion of superimposed bilateral subarachnoid hemorrhage, although residual intravascular contrast is present and pseudo-SAH from venous congestion is also possible. Critical Value/emergent results were called by telephone at the time of interpretation on 08/17/20 at 0824 hours to Dr. Erlinda Hong who verbally acknowledged these results. Electronically Signed   By: Genevie Ann M.D.   On: 2020/08/17 08:51   CT Angio Neck W and/or Wo Contrast  Result Date: 08/24/2020 CLINICAL DATA:  79 year old female code stroke presentation with loss of speech, right side weakness. Hyperdense left ICA terminus and left M1 on plain head CT. EXAM: CT ANGIOGRAPHY HEAD AND NECK TECHNIQUE: Multidetector CT imaging of the head and neck was performed using the standard protocol during bolus administration of intravenous contrast. Multiplanar CT image reconstructions and MIPs were obtained to evaluate the vascular anatomy. Carotid stenosis  measurements (when applicable) are obtained utilizing NASCET criteria, using the distal internal carotid diameter as the denominator. CONTRAST:  28mL OMNIPAQUE IOHEXOL 350 MG/ML SOLN COMPARISON:  Plain head CT at 1426 hours today. FINDINGS: CTA NECK Skeleton: Mostly absent dentition. Advanced lower cervical spine degeneration. No acute osseous abnormality identified. Upper chest: Left chest cardiac pacemaker type device. Otherwise negative. Other neck: Negative; partially retropharyngeal course of both carotids. Aortic arch: 3 vessel arch configuration. Minimal arch atherosclerosis. Right carotid system: Minimal brachiocephalic artery plaque without stenosis. Mild motion artifact at the thoracic inlet. Mildly tortuous proximal right CCA. No definite stenosis. Retropharyngeal course of the right CCA at the level of the larynx and pharynx. Minimal calcified plaque at the bifurcation affecting the ECA. No right ICA plaque or stenosis. Tortuous right ICA at the C2-C3 level. Left carotid system: Negative left CCA origin. Tortuous proximal left CCA and retropharyngeal course similar to that on the right. At the left carotid bifurcation there is mild soft and calcified plaque. The left ICA origin and bulb are patent, but the vessel appears gradually occluded distal to the bulb. There is no enhancement at the skull base. See intracranial findings below. Vertebral arteries: Negative proximal right subclavian artery. Mild calcified plaque at the right vertebral artery origin without stenosis. Patent right vertebral artery to the skull base without additional plaque or stenosis. Mild plaque in the proximal left subclavian artery without stenosis. Similar minimal calcified plaque at the left vertebral artery origin without stenosis. Dominant left vertebral artery with a tortuous V1 segment. Patent left vertebral to the skull base without stenosis.  CTA HEAD Posterior circulation: Patent distal vertebral arteries, the left is  mildly dominant. Patent PICA origins. No distal vertebral plaque or stenosis. Patent vertebrobasilar junction and basilar artery without stenosis. Patent SCA and PCA origins. Posterior communicating arteries are diminutive or absent. Right PCA branches are within normal limits. The left PCA is mildly irregular but otherwise within normal limits. Anterior circulation: Occluded left ICA siphon with superimposed mild to moderate calcified plaque. Left ICA terminus is occluded. Left MCA and ACA origins are occluded. There is no significant reconstitution of the left MCA. The left ACA is reconstituted in the A1 segment. Anterior communicating artery appears normal. Bilateral ACA branches are within normal limits. Preliminary report of the above discussed by telephone with Dr. Elnora Morrison on 08/01/2020 at 15:05 . Patent right ICA siphon with mild to moderate calcified plaque. Only mild supraclinoid stenosis. Patent right ICA terminus. Patent right MCA and ACA origins. The right A1 is within normal limits. Right MCA M1 segment and trifurcation are patent without stenosis. Right MCA branches are within normal limits. Venous sinuses: Patent. Anatomic variants: Dominant left vertebral artery. Review of the MIP images confirms the above findings IMPRESSION: 1. Positive for Emergent Large Vessel Occlusion: Left ICA T-occlusion. LICA occluded in the neck and at the terminus along with the Left MCA and ACA origins. The left ACA is reconstituted but the MCA is not. This was discussed by telephone with Dr. Elnora Morrison on 08/07/2020 at 15:05 . 2. Underlying mild for age atherosclerosis. Calcified plaque most notable in both ICA siphons. 3. Left chest pacemaker. Electronically Signed   By: Genevie Ann M.D.   On: 08/23/2020 15:13   DG CHEST PORT 1 VIEW  Result Date: 08/07/2020 CLINICAL DATA:  Respiratory failure, hypoxic EXAM: PORTABLE CHEST 1 VIEW COMPARISON:  Is 06/05/2020 FINDINGS: None endotracheal tube is been placed with its  tip 18 mm above the carina. Lung volumes are small, but are symmetric and are clear. Previously noted perihilar pulmonary infiltrate has resolved. No pneumothorax or pleural effusion. Cardiac size is mildly enlarged, unchanged. Left subclavian pacemaker is unchanged. Pulmonary vascularity is normal. No acute bone abnormality. IMPRESSION: Endotracheal tube 18 mm above the carina. Pulmonary hypoinflation. Resolved pulmonary infiltrate. Electronically Signed   By: Fidela Salisbury MD   On: 08/08/2020 19:19   ECHOCARDIOGRAM COMPLETE  Result Date: 08/09/20    ECHOCARDIOGRAM REPORT   Patient Name:   Cassandra Parker Date of Exam: 2020/08/09 Medical Rec #:  [id number]          Height:       63.0 in Accession #:    [id number]         Weight:       219.9 lb Date of Birth:  04/05/1941         BSA:          2.013 m Patient Age:    18 years           BP:           131/42 mmHg Patient Gender: F                  HR:           73 bpm. Exam Location:  Inpatient Procedure: 2D Echo, Color Doppler and Cardiac Doppler Indications:    Stroke i163.9  History:        Patient has prior history of Echocardiogram examinations, most  recent 05/23/2020. CHF, Pacemaker, Pulmonary HTN; Risk                 Factors:Hypertension, Diabetes and Dyslipidemia.  Sonographer:    Raquel Sarna Senior RDCS Referring Phys: [id number] [place]  Sonographer Comments: Scanned supine on artificial respirator. IMPRESSIONS  1. Left ventricular ejection fraction, by estimation, is 60 to 65%. The left ventricle has normal function. The left ventricle has no regional wall motion abnormalities. There is moderate left ventricular hypertrophy. Left ventricular diastolic parameters are consistent with Grade I diastolic dysfunction (impaired relaxation).  2. Right ventricular systolic function is normal. The right ventricular size is normal.  3. Left atrial size was severely dilated.  4. Right atrial size was severely dilated.  5. The mitral valve is  degenerative. Trivial mitral valve regurgitation. Mild mitral stenosis. The mean mitral valve gradient is 4.0 mmHg with average heart rate of 60 bpm. Severe mitral annular calcification.  6. The aortic valve is tricuspid. Aortic valve regurgitation is trivial. Mild aortic valve sclerosis is present, with no evidence of aortic valve stenosis. Conclusion(s)/Recommendation(s): No intracardiac source of embolism detected on this transthoracic study. A transesophageal echocardiogram is recommended to exclude cardiac source of embolism if clinically indicated. FINDINGS  Left Ventricle: Left ventricular ejection fraction, by estimation, is 60 to 65%. The left ventricle has normal function. The left ventricle has no regional wall motion abnormalities. The left ventricular internal cavity size was normal in size. There is  moderate left ventricular hypertrophy. Left ventricular diastolic parameters are consistent with Grade I diastolic dysfunction (impaired relaxation). Right Ventricle: The right ventricular size is normal. No increase in right ventricular wall thickness. Right ventricular systolic function is normal. Left Atrium: Left atrial size was severely dilated. Right Atrium: Right atrial size was severely dilated. Pericardium: There is no evidence of pericardial effusion. Mitral Valve: The mitral valve is degenerative in appearance. Severe mitral annular calcification. Trivial mitral valve regurgitation. Mild mitral valve stenosis. MV peak gradient, 10.0 mmHg. The mean mitral valve gradient is 4.0 mmHg with average heart rate of 60 bpm. Tricuspid Valve: The tricuspid valve is normal in structure. Tricuspid valve regurgitation is mild. Aortic Valve: The aortic valve is tricuspid. Aortic valve regurgitation is trivial. Mild aortic valve sclerosis is present, with no evidence of aortic valve stenosis. Pulmonic Valve: The pulmonic valve was normal in structure. Pulmonic valve regurgitation is trivial. No evidence of  pulmonic stenosis. Aorta: The aortic root is normal in size and structure. Venous: IVC assessment for right atrial pressure unable to be performed due to mechanical ventilation. IAS/Shunts: No atrial level shunt detected by color flow Doppler. Additional Comments: A pacer wire is visualized in the right atrium and right ventricle.  LEFT VENTRICLE PLAX 2D LVIDd:         3.80 cm  Diastology LVIDs:         2.10 cm  LV e' medial:    4.03 cm/s LV PW:         1.70 cm  LV E/e' medial:  25.3 LV IVS:        1.30 cm  LV e' lateral:   4.79 cm/s LVOT diam:     1.90 cm  LV E/e' lateral: 21.3 LV SV:         103 LV SV Index:   51 LVOT Area:     2.84 cm  RIGHT VENTRICLE RV S prime:     7.83 cm/s TAPSE (M-mode): 1.7 cm LEFT ATRIUM  Index       RIGHT ATRIUM           Index LA diam:        4.40 cm  2.19 cm/m  RA Area:     29.90 cm LA Vol (A2C):   111.0 ml 55.14 ml/m RA Volume:   97.00 ml  48.18 ml/m LA Vol (A4C):   107.0 ml 53.15 ml/m LA Biplane Vol: 110.0 ml 54.64 ml/m  AORTIC VALVE LVOT Vmax:   127.00 cm/s LVOT Vmean:  100.000 cm/s LVOT VTI:    0.363 m  AORTA Ao Root diam: 3.10 cm Ao Asc diam:  3.20 cm MITRAL VALVE                TRICUSPID VALVE MV Area (PHT): 2.09 cm     TR Peak grad:   34.6 mmHg MV Peak grad:  10.0 mmHg    TR Vmax:        294.00 cm/s MV Mean grad:  4.0 mmHg MV Vmax:       1.58 m/s     SHUNTS MV Vmean:      96.9 cm/s    Systemic VTI:  0.36 m MV Decel Time: 363 msec     Systemic Diam: 1.90 cm MV E velocity: 102.00 cm/s MV A velocity: 148.00 cm/s MV E/A ratio:  0.69 Cherlynn Kaiser MD Electronically signed by Cherlynn Kaiser MD Signature Date/Time: 07/30/20/4:28:24 PM    Final    CT HEAD CODE STROKE WO CONTRAST  Result Date: 08/19/2020 CLINICAL DATA:  Code stroke. Neuro deficit, acute stroke suspected. Altered mental status. Unable to speak or move right side. EXAM: CT HEAD WITHOUT CONTRAST TECHNIQUE: Contiguous axial images were obtained from the base of the skull through the vertex without  intravenous contrast. COMPARISON:  CT head 06/05/2020. FINDINGS: Brain: Evaluation is limited by motion; however, possible mild hypodensity of the left insula. No acute hemorrhage. Similar appearance of a remote high left frontal cortical infarct. Additional patchy white matter hypoattenuation, compatible with chronic microvascular ischemic disease. Vascular: Hyperdense left ICA terminus and proximal M1 MCA, concerning for thrombus. Calcific atherosclerosis. Skull: No acute fracture. Sinuses/Orbits: No acute findings. Other: No mastoid effusion. ASPECTS [facility] Stroke Program Early CT Score) - Ganglionic level infarction (caudate, lentiform nuclei, internal capsule, insula, M1-M3 cortex): 6 - Supraganglionic infarction (M4-M6 cortex): 3 Total score (0-10 with 10 being normal): 9 IMPRESSION: 1. Hyperdense left ICA terminus and proximal left M1 MCA, concerning for thrombus. See forthcoming ordered CTA for further evaluation. 2. Evaluation is limited by patient motion; however, possible mild hypodensity of the left insula, as can be seen with acute MCA territory infarct. ASPECTS is 9. 3. No acute hemorrhage. Code stroke imaging results were communicated on 08/12/2020 at 2:41 pm to provider Dr. Reather Converse Via telephone, who verbally acknowledged these results. Electronically Signed   By: Margaretha Sheffield MD   On: 08/02/2020 14:48      HISTORY OF PRESENT ILLNESS Cassandra Parker is a 79 y.o. female with a past medical history significant for paroxysmal atrial fibrillation off of Eliquis due to recent GI bleed in the setting of recent hospitalization for pneumonia, end-stage renal disease on intermittent hemodialysis with recent fistula placement in the right upper extremity, hypertension, hyperlipidemia, diabetes (A1c 7.4% on 05/22/2020), cardiomyopathy (EF 60-65% w/ G1dd on 05/23/20) status post pacemaker, mitral stenosis and mild MR, and breast cancer (status post chemo and right mastectomy in 1998).   Presented to  [facility] ED and seen  by Dr. Rory Percy w/ teleneurology housed at [facility] with [place] confirmed with daughter Thomasine Klutts - at 56 PM 08/22/2020 with sudden onset of right-sided weakness and inability to talk.  The family reports that at baseline she is able to take care of herself independently, walk most of the time without any support-has been using a walker only for some support for the last month after her discharge from the hospital for a pneumonia that she is being treated for, has not had a stroke in the past and has no neurological complaints other than some neuropathy. Patient is unable to provide any history. A code stroke was activated at the emergency room at [facility] after her initial evaluation by the EDP. Teleneurology was consulted. The daughter confirmed that the patient was on anticoagulation at some point in the past but it was discontinued over a month ago. mRS 0-1. NIHSS score:24. IV tPA was administered 08/23/2020 at 1530. Found to have a L T occlusion, transferred to [facility] for IR.      HOSPITAL COURSE Ms. Cassandra Parker is a 79 y.o. female with history of AF off AC d/t recent GIB,breast cancer, cardiomyopathy, complete heart block s/p pacemaker, chronic combined systolic and diastolic congestive heart failure, diabetes with neuropathy, hypertension, hypercholesterolemia, mitral stenosis with trivial MR,ESRD on intermittent HD w/ RUE fistula, hospitalized recently for PNA found down in the bathroom, presenting to [facility] R sided weakness and inability to talk. Received IV tPA 07/30/2020 at 1500. Found to have T occlusion and transferred to [facility] for mechanical thrombectomy.    Stroke:  L MCA/ACA large infarct due to left T occlusion s/p tPA and IR with TICI2c. Infarct felt to be w/ embolic d/t w/ AF off AC d/t recent GIB.  Hemorrhagic transformation and IVH Diffuse SAH  Code  Stroke CT head hyperdense L ICA terminus and proximal L M1. Mild hypodensity L insula. ASPECTS 9.    CTA head & neck ELVO L ICA T occlusion w/ occlusion in neck. L ACA reconstituted, MCA is not. Mild atherosclerosis. Plaque B ICA siphons  Cerebral angio TICI2c revascularization L MCA, L ICA w/ 1 pass solitaire   Post IR CT L MCA cortical hyperemia w/ small L sylvian SAH.   Repeat CT head w/ neuro worsening large L MCA infarct w/ severe mass effect and hemorrhagic transformation, new L ACA infarct. 34mm R shift w/ trapping R ventricle and partially effaced basilar cisterns. Intraventricular contrast present. Possible B SAH vs constrast vs venous congestion. Possible subfalcine herniation.   2D Echo EF 60-65%. No source of embolus. LA severely dilated  LDL 82  HgbA1c 8.5  No antithrombotic prior to admission, not treated with antithrombotic given within 24h of tPA and hemorrhage   Poor neurologic prognosis with no chance of survival. Daughter agreeable to DNR.   Severe Cerebral Edema with brain herniation  Pupil changes noted during the night, unable to get CT at that time d/t groin instability  Repeat CT head w/ neuro worsening large L MCA infarct w/ severe mass effect and hemorrhagic transformation, new L ACA infarct. 90mm R shift w/ trapping R ventricle and partially effaced basilar cisterns. Intraventricular contrast present. Possible B SAH vs constrast vs venous congestion. Possible subfalcine herniation.   Started on 3% protocol @ 50/h  Goal Na 150-155  NSY consulted. No role for surgical decompression. Discussed terminal nature of stroke.   Respiratory Failure  Intubated for  IR, left intubated post IR  CCM onboard  Three children arrived at  bedside. Decided on 1-way extubation with comfort care   Extubated 1625  Died 1647 w/ family at bedside  Acute Blood Loss Anemia d/t Groin Bleeding  R groin sheath left in place post tPA/IR d/t INR 2.7  Groin bleeding noted  0100 w/ pressure held   Elevated INR + low fibrinogen levels likely d/t tPA   FFP 2u  Dr. Estanislado Pandy pulled sheath [phone] d/t ongoing bleeding  HGB 11.5->9.5->8.4  INR 2.7->2.0->1.6  D-dimer > 20  Fibrinogen < 60  CT abd/pelvis no source bleeding  TXA and Cyro given  Atrial Fibrillation  Home anticoagulation:  none, previously on Eliquis but stopped 2 mos ago d/t GIB   Currently rate controlled  Not on antithrombotics now  Hypertension  Home meds:  amlodipine 10 mg, imdur 30 mg, Coreg 6.25 bid  BP goal < 140 given hemorrhage.   On Cleviprex gtt  Hyperlipidemia  Home meds:  pravachol 10  LDL 82, goal < 70  Continue statin at discharge  Diabetes type II Uncontrolled w/ neuropathy  HgbA1c 8.5, goal < 7.0  DB RN onboard  Dysphagia  Secondary to stroke  NPO   Other Stroke Risk Factors  Advanced Age >/= 73   Obesity, Body mass index is 38.95 kg/m., BMI >/= 30 associated with increased stroke risk, recommend weight loss, diet and exercise as appropriate   Coronary artery disease - 3VD seen on CTA  Chronic combined systolic and diastolic Congestive heart failure, on lasix + K PTA  Cardiomyopathy   Other Active Problems  ESRD on intermittent HD w/ RUE fistula. Nephrology consulted    CHB s/p St Jude pacer, not MRI compatible  L kidney lesion, MRI w/w/o recommended by rad but has non-compatible pacer  Aortic atherosclerosis  Mitral stenosis w/ mild MR  Hx breast cancer s/p chemo & mastectomy   Chronic pain on norco PTA, on hold  B12 deficiency on replacement  GERD on PPI   Low vitamin D on replacement   DEATH  Date of Death - 08-08-2020  Time of Death - 1645/12/26  Cause of Death - L MCA/ACA infarct, embolic d/t AF off AC with post tPA/IR Hemorrhagic transformation, IVH and diffuse SAH  Burnetta Sabin, MSN, APRN, ANVP-BC, AGPCNP-BC Advanced Practice Stroke Nurse [place] for Schedule & Pager  information August 08, 2020 6:03 PM  minutes were spent preparing discharge.

## 2020-08-25 NOTE — Procedures (Signed)
Extubation Procedure Note  Patient Details:   Name: Cassandra Parker DOB: [birth year] MRN: [id number]   Airway Documentation:    Vent end date: 08-10-2020 Vent end time: 1624   Evaluation  Patient extubated per comfort care measures. Family and RN at bedside.  Cassandra Parker August 10, 2020, 4:25 PM

## 2020-08-25 NOTE — Progress Notes (Signed)
SLP Cancellation Note  Patient Details Name: Cassandra Parker MRN: [id number] DOB: 03-04-1941   Cancelled treatment:       Reason Eval/Treat Not Completed: Medical issues which prohibited therapy. Pt on vent this morning. Will f/u as able.   Osie Bond., M.A. [facility] Acute Rehabilitation Services Pager [phone] Office [phone]  August 13, 2020, 8:29 AM

## 2020-08-25 NOTE — Progress Notes (Addendum)
S: Called to the bedside to evaluate the patient due to worsening bleeding from her right groin sheath site. Bleeding began at about 1 AM and has been increasing in rate despite continuous pressure being administered by nursing staff.   O: Blood parameters drawn at at 4:41 and 4:55 PM yesterday:  PT 27.9 INR 2.7 PTT 146 Platelets 210 Hgb 11.5 RBC 3.9  Exam:  BP (!) 110/51   Pulse (!) 59   Temp 98 F (36.7 C) (Axillary)   Resp 19   Wt 99.7 kg   SpO2 100%   BMI 38.95 kg/m   HEENT: [place]/AT  Heart: RRR Lungs: Respirations unlabored Groin site: Bleeding from groin site is noted  Neurological exam: Ment: Sedated on propofol. Eyes closed and not responding to noxious or verbal stimuli CN: Pupils equal at 2 mm and sluggishly reactive (on propofol). Face flaccidly symmetric Motor/Sensory: Limbs are with flaccid tone x 4. No significant movement to plantar stimulation bilaterally (sedated on propofol).  Reflexes: Hypoactive.   A/R: Sustained bleeding from right groin site following mechanical thrombectomy for left ICA and MCA occlusions.  1. The patient received IV tPA at 3 PM. Given the tPA half-life of approximately 10 minutes and administration over 12 hours ago, there is no indication for tPA reversal.  2. INR is elevated at 2.7. Unknown etiology. No history of bleeding diathesis per family. Stopped anticoagulation approximately 3 months ago and daughter reiterates that the patient has not restarted or mistakenly taken her old anticoagulant prescription since stopping. Platelets WNL.  3. Discussed with Vascular Surgery and Dr. Estanislado Pandy. Repeat STAT blood draw has been sent to lab and repeat INR, PT, PTT and CBC are pending. STAT fibrinogen level also ordered. If still with elevated coagulation parameters, then will administer 2 units of FFP 4. Will need to hold off on repeat CT head for now given potential for acute hemodynamic instability in the setting of the uncontrolled bleeding.   5. Vascular Surgery and Dr. Estanislado Pandy both advised that risks of pulling the groin sheath without normalization of coagulation profile would outweigh benefits.  6. Discussed with patient's daughter over the telephone. She has given informed consent for administration of blood products.   40 minutes spent in the emergent neurological evaluation and management of this critically ill patient.   Addendum: Coagulation profile resulted:   Although the above levels are improved, labs are consistent with continued impaired coagulation.   STAT CBC reveals Hgb of 9.5, two points lower than most recent previous level. Platelets are 189.   FFP 2 units has been ordered STAT via the blood transfusion order set. Repeat coags and fibrinogen level will be drawn after the infusion. Discussed with RN and Pharmacy.   Addendum: -- Called for unequal pupils, 3 mm on right and 2 mm on left, both sluggish. May be secondary to intracerebral bleed on the left, which was the location of the acute stroke. Unable to obtain CT head as continuous manual pressure must be placed on right access site while sheath is still in.  -- Cannot pull sheath due to risk of catastrophic bleeding with elevated INR and PTT.  -- After FFP infusion completed and if labs show resolution of coagulopathy, will call Dr. Estanislado Pandy for approval on sheath removal, followed by STAT CT head -- FFP has been transported from the lab by RN and infusion will be started  Electronically signed: Dr. Kerney Elbe

## 2020-08-25 NOTE — Progress Notes (Addendum)
OT Cancellation Note  Patient Details Name: Cassandra Parker MRN: [id number] DOB: Jan 26, 1941   Cancelled Treatment:    Reason Eval/Treat Not Completed: Medical issues which prohibited therapy;Active bedrest order.  Spoke with MD, OT will sign off at this time.   Nilsa Nutting., OTR/L Acute Rehabilitation Services Pager [phone] Office [phone]   Lucille Passy M 2020-08-17, 9:36 AM

## 2020-08-25 NOTE — Progress Notes (Signed)
NAME:  Cassandra Parker, MRN:  [id number], DOB:  Nov 18, 1940, LOS: 1 ADMISSION DATE:  08/10/2020, CONSULTATION DATE:  08/01/2020 REFERRING MD:  devashwar, CHIEF COMPLAINT:  Post operative resp insuff  Brief History   79 yo with acute R weakness and inability to talk. Found to have L MCA occlusion taken for thrombectomy  History of present illness   79 yo black female with pmh breast cancer, cm, chb s/p ppm, chronic hf (systolic/diastolic), htn, hyperlipidemia, ckd5 with RUE fistula in place (not on dialysis) who presented with last known normal of 1200 today and found with sudden onsiet of R sided weakness and inability to talk. She was transferred from [facility]sh after tele neuro recommended thrombectomy.   Pt underwent emergency intervention with revascularization. She arrives to our ICU intuabted and sedated for which we have been consulted. SBP goal 120-140.   Past Medical History   Past Medical History:  Diagnosis Date  . Acute renal insufficiency 10/15/2013  . Arthritis   . Cancer [facility]) 2008   right breast  . Cancer of breast- s/p chemo and Rt mastectomy 2008 10/13/2013  . Cardiomyopathy ([place])    dating back to 2010  . CHB (complete heart block) ([place]) 10/13/2013  . Chronic combined systolic and diastolic congestive heart failure ([place])   . Diabetes mellitus    with neuropathy  . Diabetes mellitus type 2, controlled, without complications ([place]) 1/32/4401  . Dyslipidemia 10/13/2013  . History of blood transfusion   . Hypercholesterolemia   . Hypertension   . Mitral stenosis- moderate with trivial MR  10/15/2013  . Pacemaker 10/23/2014  . Pneumonia   . Presence of permanent cardiac pacemaker    St. Jude  . Primary osteoarthritis of left knee 11/04/2015  . Syncope 10/13/2013  . Tingling    feet bilat     Significant Hospital Events     Consults:  Ccm 11/2  Procedures:  11/2: L MCA thrombectomy  Significant Diagnostic Tests:  11/2 cta head and neck: 1. Positive for Emergent  Large Vessel Occlusion: Left ICA T-occlusion.  LICA occluded in the neck and at the terminus along with the Left MCA and ACA origins. The left ACA is reconstituted but the MCA is not. This was discussed by telephone with Dr. Elnora Morrison on 08/09/2020 at 15:05 .  2. Underlying mild for age atherosclerosis. Calcified plaque most notable in both ICA siphons. 3. Left chest pacemaker  11/3 TTE >>  11/3 CTH >>  1. Severe intracranial mass effect and severe infarction of the left hemisphere. 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 New left ACA infarct which may be subfalcine herniation related. 2. Rightward midline shift of 18 mm with trapping of the right lateral ventricle and partially effaced basilar cisterns. 3. Suspicion of superimposed bilateral subarachnoid hemorrhage, although residual intravascular contrast is present and pseudo-SAH from venous congestion is also possible.  11/3 CT abd/pelvis >> 1. No occult hemorrhage identified in the abdomen or pelvis to account for the patient's decreasing hematocrit. 2. No acute findings noted in the abdomen or pelvis. 3. 1.7 x 1.3 cm high attenuation lesion in the medial aspect of the upper pole of the left kidney, incompletely characterized on today's examination. Further evaluation with nonemergent abdominal MRI with and without IV gadolinium is recommended after resolution of the patient's acute illness to exclude the possibility of neoplasm. 4. Colonic diverticulosis without evidence of acute diverticulitis at this time. 5. Aortic atherosclerosis, in addition to at least 3 vessel coronary  artery disease. Assessment for potential risk factor modification, dietary therapy or pharmacologic therapy may be warranted, if clinically indicated. 6. Additional incidental findings, as above.  Micro Data:  11/2 SARS2/ flu >> neg 11/2 MRSA PCR >> neg  Antimicrobials:  11/2 cefazolin   Interim history/subjective:    Bleeding of right groin overnight- sheath removed by IR with prolonged manual pressure INR 2.7 s/p 2 units FFP -> now 1.6 fibrinogen < 60 Hgb 11.5-> 9.8-> 8.4 Also with neuro changes, now with extensor posturing, pupils non reactive and anisocoria,  L > R Started on 3% and headed for emergent CT   Objective   Blood pressure (!) 118/46, pulse 60, temperature (!) 97.2 F (36.2 C), resp. rate 18, weight 99.7 kg, SpO2 98 %.    Vent Mode: PRVC FiO2 (%):  [40 %-50 %] 40 % Set Rate:  [18 bmp] 18 bmp Vt Set:  [410 mL] 410 mL PEEP:  [5 cmH20] 5 cmH20 Plateau Pressure:  [16 cmH20-19 cmH20] 16 cmH20   Intake/Output Summary (Last 24 hours) at 08/10/2020 0804 Last data filed at 2020-08-10 0700 Gross per 24 hour  Intake 1623.13 ml  Output --  Net 1623.13 ml   Filed Weights   08/24/2020 1450  Weight: 99.7 kg    Examination: General:  Critically ill female in NAD intubated on mechanical ventilation HEENT: MM pink/moist, L pupil irregular/ 5/ non reactive, R pupil 4/non reactive Neuro: unresponsive, extensor posturing on noxious stimuli, +gag/ cough CV: rr, no murmur, right femoral site dry, no hematoma, bruising around femoral site, intact dp pulse PULM:  MV supported breaths, CTA GI: obese soft, bs active, foley  Extremities: warm/dry, +1 LE edema, RUE fistula in place Skin: no rashes   Resolved Hospital Problem list     Assessment & Plan:  Acute L MCA CVA and Left ICA occlusion:  - s/p thrombectomy 11/2 - s/p cryo, TXA this am given coagulapathy with emergent T&S - DIC panel pending - CTH -> -> showing malignant hemorrhagic transformation, new left ACA infarct which may be related to subfalcine herniation with 18 mm rightward midline shift with trapping of right lateral ventricle and partially effaced basilar cisterns.   - NSGY consulted-> not surgical candidate; devastating neurological changes on exam and imaging, which are unfortunately non-survivable.  She has been made a DNR and  awaiting family arrival, likely transitioning to comfort care when family is ready, however there is a chance patient naturally pass on her own prior to that.  - continuing cleviprex and hypertonic saline for now to hopefully facilitate time for family to visit  Post operative resp failure:  - full MV support   Acute on ckd5:  -does have fistula in RUE  CHB s/p ppm Chronic combined systolic/diastolic hf (without acute exacerbation) - tele monitoring - echo pending  DMT2 Trending CBG  HTN - continue SBP goal 120-140 on cleviprex   hypercholesterolemia  ABLA Coagulopathy, fibrinogen < 60, elevated INR  - Hgb 11.5-> 8.4 - s/p 2FFP, 2units cryo, and TXA  - T&S sent, DIC panel pending  - CT abd/pelvis neg for RTP  Best practice:  Diet: NPO Pain/Anxiety/Delirium protocol (if indicated): n/a VAP protocol (if indicated): per protocol DVT prophylaxis: SCDs GI prophylaxis: PPI Glucose control: trend  Mobility: bedrest for now Code Status: DNR Family Communication: family has been called in per Neurology Disposition: ICU  Labs   CBC: Recent Labs  Lab 08/05/2020 1633 08/09/2020 1641 August 10, 2020 0215 10-Aug-2020 0553  WBC  --  10.4  8.1 8.5  NEUTROABS  --  8.2* 6.2  --   HGB 12.6 11.5* 9.5* 8.4*  HCT 37.0 37.8 30.4* 27.1*  MCV  --  96.9 94.7 95.1  PLT  --  210 189 542    Basic Metabolic Panel: Recent Labs  Lab 08/03/2020 1633 08/21/2020 1641 07/26/2020 2305 08/07/20 0215 07-Aug-2020 0553  NA 142 140 141 140 141  K 5.3* 5.4* 4.0 4.0 4.0  CL 106 107 110 108 108  CO2  --  17* 18* 18* 20*  GLUCOSE 314* 336* 307* 246* 234*  BUN 104* 94* 87* 85* 89*  CREATININE 4.20* 4.32* 4.18* 4.47* 4.31*  CALCIUM  --  8.6* 8.1* 8.4* 8.3*   GFR: Estimated Creatinine Clearance: 12.5 mL/min (A) (by C-G formula based on SCr of 4.31 mg/dL (H)). Recent Labs  Lab 08/24/2020 1641 08-07-2020 0215 08/07/20 0553  WBC 10.4 8.1 8.5    Liver Function Tests: Recent Labs  Lab 07/26/2020 1641  AST 27    ALT 24  ALKPHOS 64  BILITOT 0.8  PROT 6.7  ALBUMIN 3.4*   No results for input(s): LIPASE, AMYLASE in the last 168 hours. No results for input(s): AMMONIA in the last 168 hours.  ABG    Component Value Date/Time   TCO2 27 08/01/2020 1633     Coagulation Profile: Recent Labs  Lab 08/10/2020 1655 2020/08/07 0215 08-07-2020 0553  INR 2.7* 2.0* 1.6*    Cardiac Enzymes: No results for input(s): CKTOTAL, CKMB, CKMBINDEX, TROPONINI in the last 168 hours.  HbA1C: Hgb A1c MFr Bld  Date/Time Value Ref Range Status  07-Aug-2020 02:15 AM 8.5 (H) 4.8 - 5.6 % Final    Comment:    (NOTE) Pre diabetes:          5.7%-6.4%  Diabetes:              >6.4%  Glycemic control for   <7.0% adults with diabetes   05/22/2020 11:15 AM 7.4 (H) 4.8 - 5.6 % Final    Comment:    (NOTE) Pre diabetes:          5.7%-6.4%  Diabetes:              >6.4%  Glycemic control for   <7.0% adults with diabetes     CBG: Recent Labs  Lab 08/15/2020 1459 08/17/2020 1938 08/09/2020 2333  GLUCAP 366* 238* 263*    CCT: Oliver, ACNP [facility] 2020/08/07, 8:05 AM  See Shea Evans for personal pager PCCM on call pager [phone]

## 2020-08-25 DEATH — deceased

## 2020-11-22 ENCOUNTER — Ambulatory Visit: Payer: Medicare HMO | Admitting: Cardiology

## 2021-09-17 IMAGING — DX DG CHEST 1V PORT
1 series · 1 of 1 positions shown · non-contrast
Comparison: July 25, 2019

CLINICAL DATA: Cough.

EXAM:
PORTABLE CHEST 1 VIEW

[chest ap]
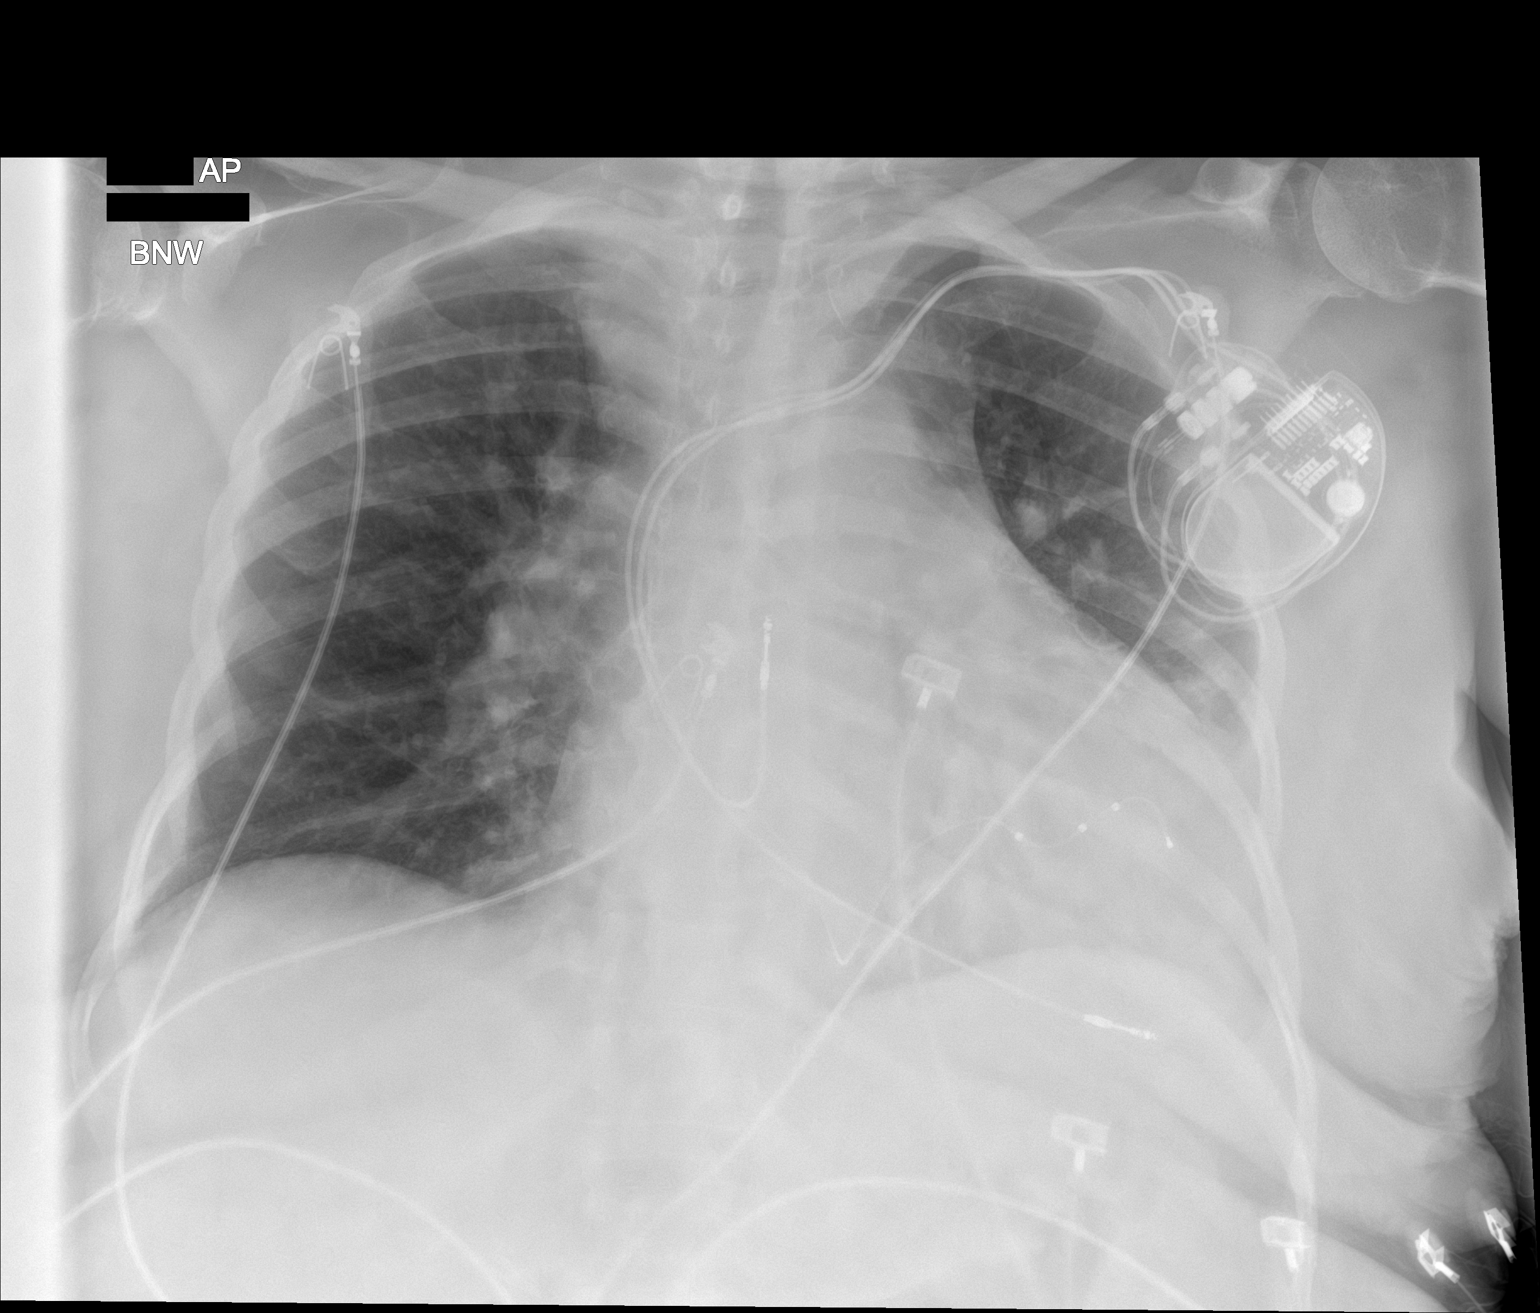

[1 of 1 positions shown; findings below may reference images not displayed]

FINDINGS: Patient has a 3 lead pacemaker. No pneumothorax. Cardiomegaly. The
hila and mediastinum are normal. No overt pulmonary edema, nodule,
mass, or focal infiltrate.
IMPRESSION: No active disease.

## 2022-07-15 IMAGING — DX DG CHEST 1V PORT
1 series · 1 of 1 positions shown · non-contrast
Comparison: Portable exam 4489 hours compared to 12/21/2019

CLINICAL DATA: Shortness of breath, hypoxemia, end-stage renal
disease on dialysis, LEFT-sided crackles, RIGHT-side week

EXAM:
PORTABLE CHEST 1 VIEW

[chest ap grid]
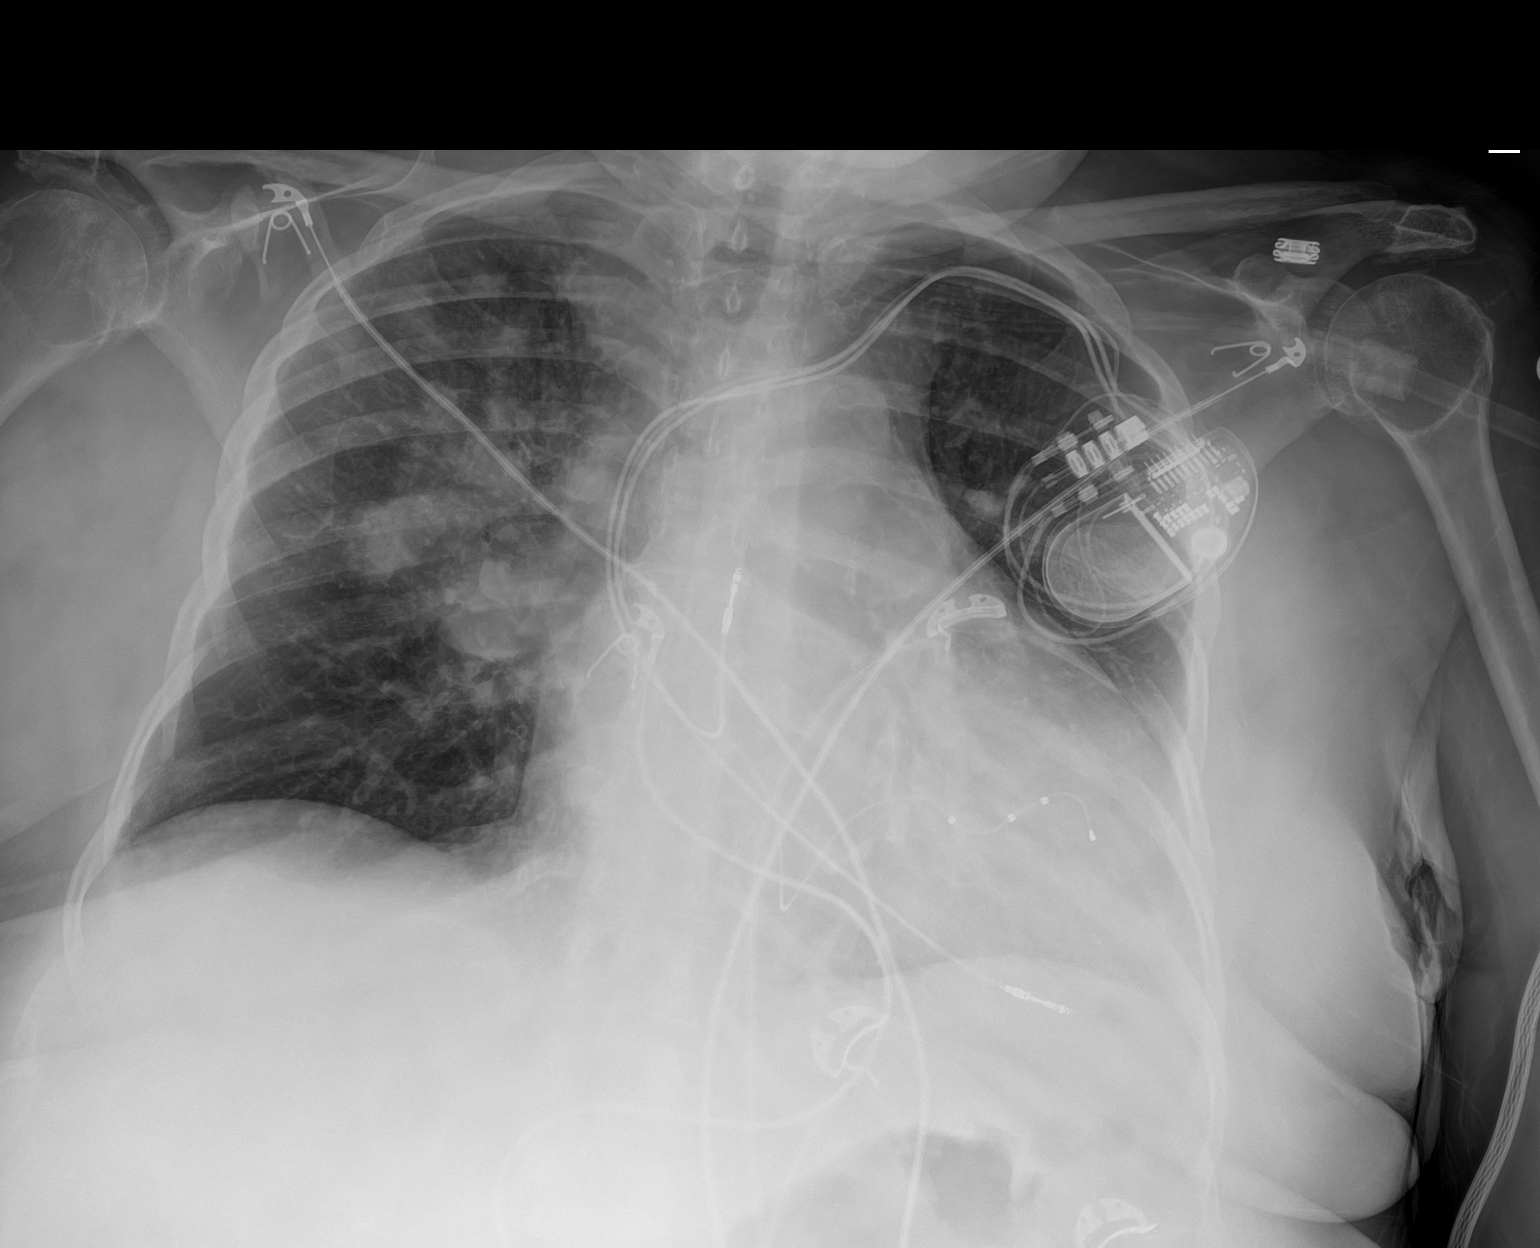

[1 of 1 positions shown; findings below may reference images not displayed]

FINDINGS: LEFT subclavian sequential pacemaker with leads projecting over
RIGHT atrium, RIGHT ventricle, and coronary sinus.

Enlargement of cardiac silhouette with vascular congestion.

Minimal LEFT base atelectasis.

RIGHT perihilar opacities extending into RIGHT upper lobe favor
pneumonia although requiring follow-up until resolution to exclude
underlying nodule.

Question mild enlargement of RIGHT hilum versus perihilar
infiltrate.

Remaining lungs clear.

No pleural effusion or pneumothorax.

Bones demineralized.
IMPRESSION: Enlargement of cardiac silhouette post pacemaker.

Questionable RIGHT hilar enlargement/adenopathy versus perihilar
infiltrate.

RIGHT upper lobe pneumonia; follow-up exams until resolution
recommended to exclude underlying pulmonary mass/nodule and hilar
adenopathy.

## 2022-07-21 IMAGING — DX DG CHEST 2V
2 series · 2 of 2 positions shown · non-contrast
Comparison: 05/26/2020

CLINICAL DATA: Shortness of breath.

EXAM:
CHEST - 2 VIEW

[chest pa]
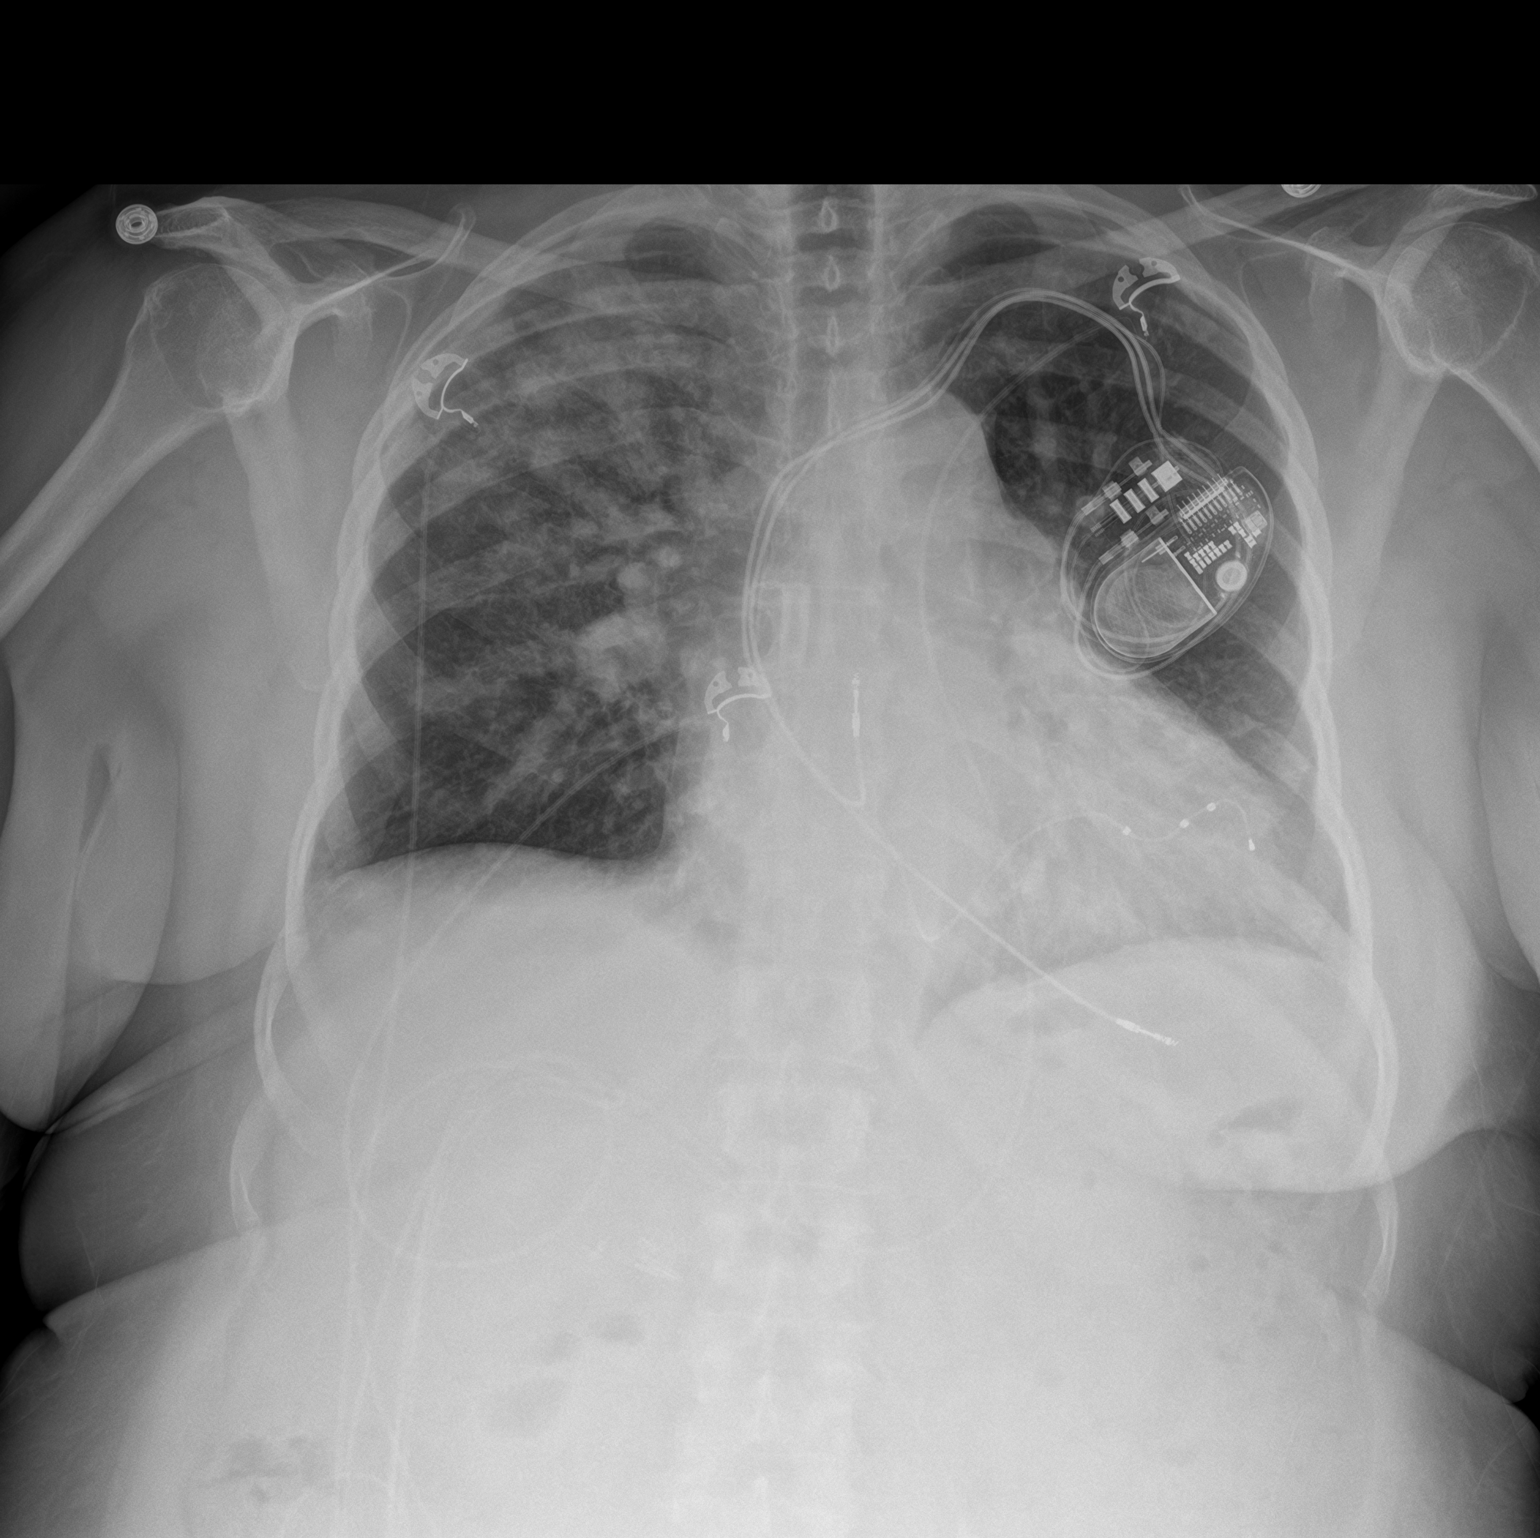

[chest lat]
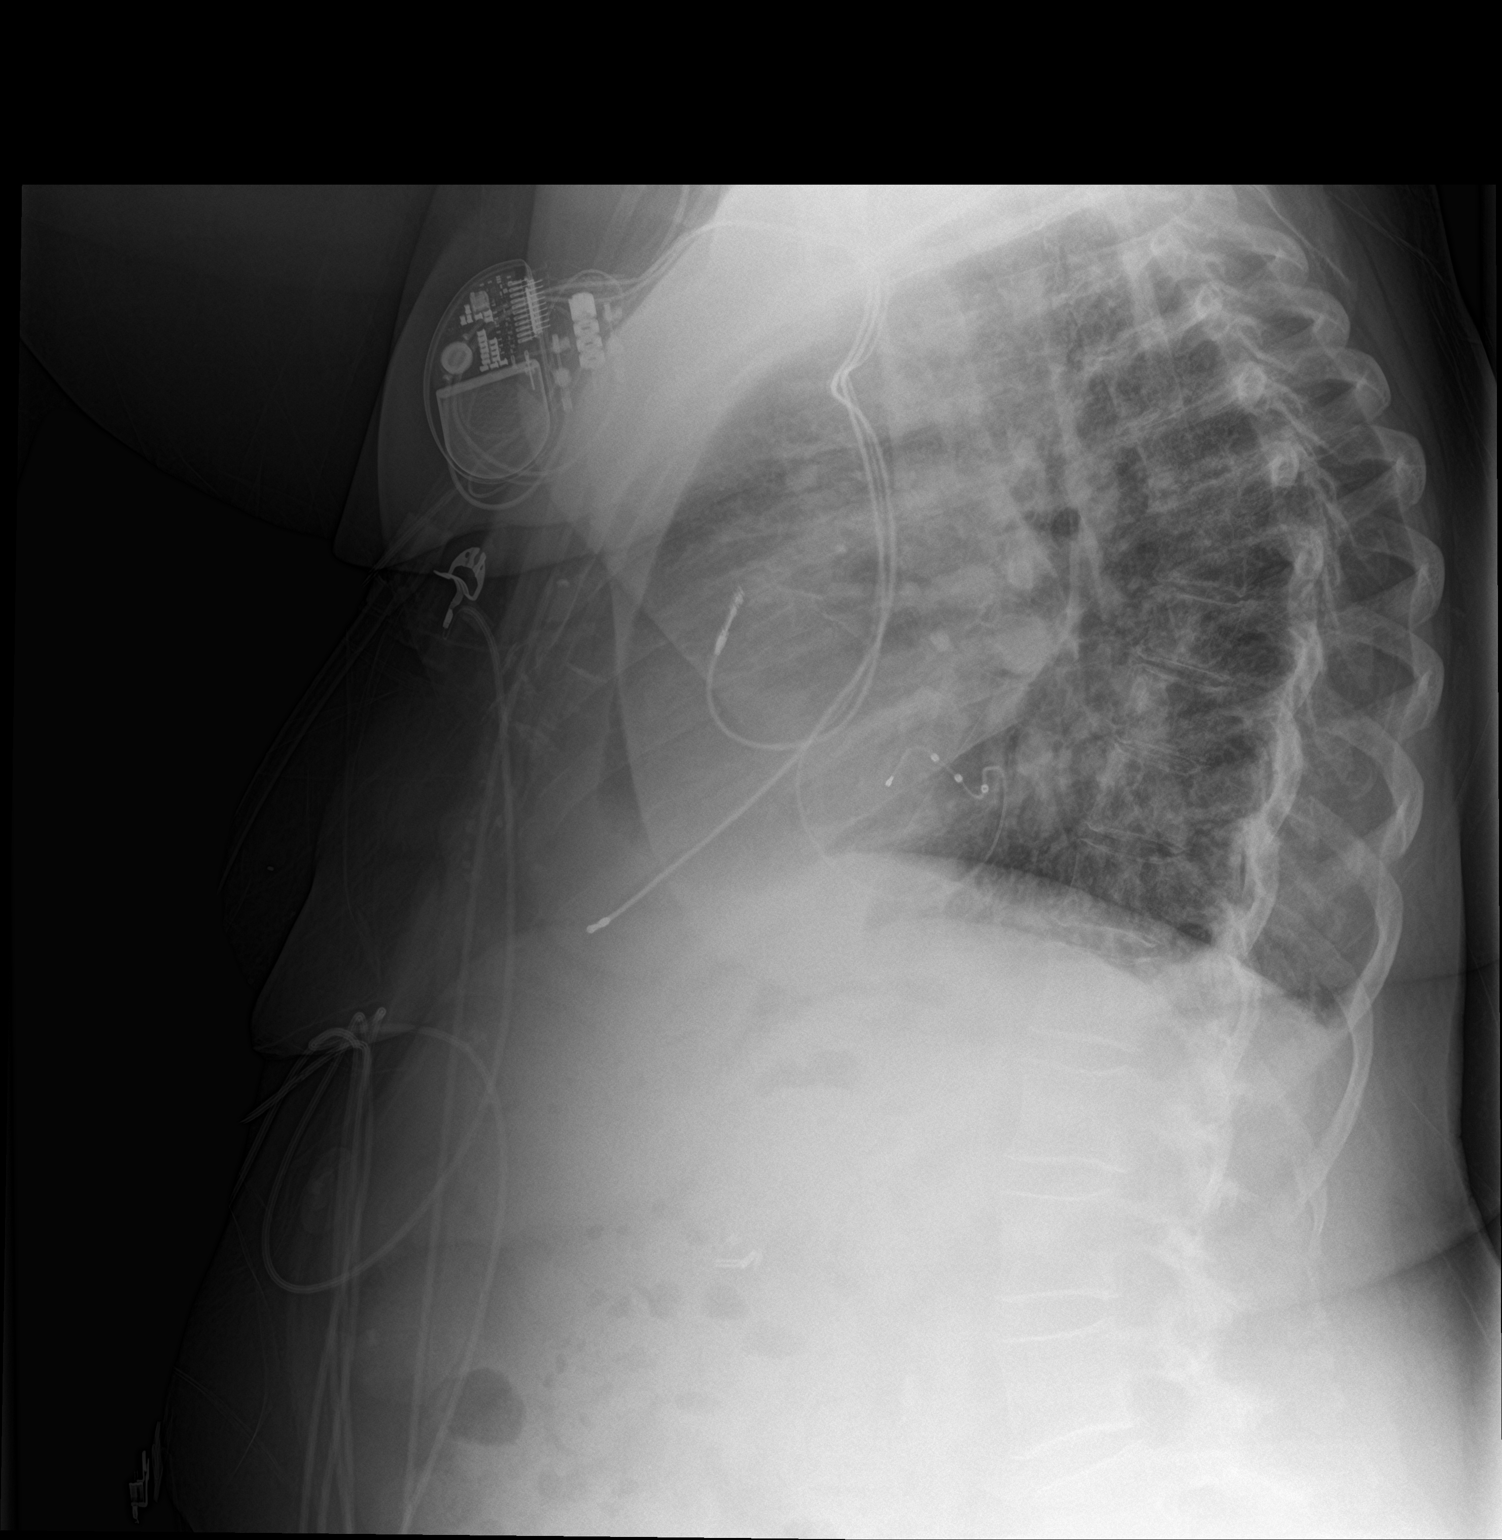

[2 of 2 positions shown; findings below may reference images not displayed]

FINDINGS: The cardio pericardial silhouette is enlarged. Patchy airspace
disease in the right upper lung appears mildly improved in the
interval. Patchy retrocardiac airspace opacity at the left base
noted on today's study. No pleural effusion. Telemetry leads overlie
the chest.
IMPRESSION: Interval improvement in patchy airspace disease in the right upper
lung with retrocardiac patchy airspace disease noted at the left
base.
